# Patient Record
Sex: Male | Born: 1940 | Race: White | Hispanic: No | Marital: Married | Smoking: Former smoker
Health system: Southern US, Community
[De-identification: ages and names within clinical notes are randomized; demographics above are authoritative.]

## PROBLEM LIST (undated history)

## (undated) DIAGNOSIS — S8700XA Crushing injury of unspecified knee, initial encounter: Secondary | ICD-10-CM

## (undated) DIAGNOSIS — Z973 Presence of spectacles and contact lenses: Secondary | ICD-10-CM

## (undated) DIAGNOSIS — K219 Gastro-esophageal reflux disease without esophagitis: Secondary | ICD-10-CM

## (undated) DIAGNOSIS — E785 Hyperlipidemia, unspecified: Secondary | ICD-10-CM

## (undated) DIAGNOSIS — R351 Nocturia: Secondary | ICD-10-CM

## (undated) DIAGNOSIS — K5900 Constipation, unspecified: Secondary | ICD-10-CM

## (undated) DIAGNOSIS — K2981 Duodenitis with bleeding: Secondary | ICD-10-CM

## (undated) DIAGNOSIS — G8929 Other chronic pain: Secondary | ICD-10-CM

## (undated) DIAGNOSIS — E119 Type 2 diabetes mellitus without complications: Secondary | ICD-10-CM

## (undated) DIAGNOSIS — M545 Low back pain, unspecified: Secondary | ICD-10-CM

## (undated) DIAGNOSIS — N400 Enlarged prostate without lower urinary tract symptoms: Secondary | ICD-10-CM

## (undated) DIAGNOSIS — G4700 Insomnia, unspecified: Secondary | ICD-10-CM

## (undated) DIAGNOSIS — R1310 Dysphagia, unspecified: Secondary | ICD-10-CM

## (undated) DIAGNOSIS — M1990 Unspecified osteoarthritis, unspecified site: Secondary | ICD-10-CM

## (undated) DIAGNOSIS — Z9989 Dependence on other enabling machines and devices: Secondary | ICD-10-CM

## (undated) DIAGNOSIS — S82092A Other fracture of left patella, initial encounter for closed fracture: Secondary | ICD-10-CM

## (undated) DIAGNOSIS — J189 Pneumonia, unspecified organism: Secondary | ICD-10-CM

## (undated) DIAGNOSIS — G4733 Obstructive sleep apnea (adult) (pediatric): Secondary | ICD-10-CM

## (undated) DIAGNOSIS — R2681 Unsteadiness on feet: Secondary | ICD-10-CM

## (undated) DIAGNOSIS — M2540 Effusion, unspecified joint: Secondary | ICD-10-CM

## (undated) DIAGNOSIS — K22719 Barrett's esophagus with dysplasia, unspecified: Secondary | ICD-10-CM

## (undated) DIAGNOSIS — I499 Cardiac arrhythmia, unspecified: Secondary | ICD-10-CM

## (undated) DIAGNOSIS — R0600 Dyspnea, unspecified: Secondary | ICD-10-CM

## (undated) DIAGNOSIS — M2550 Pain in unspecified joint: Secondary | ICD-10-CM

## (undated) DIAGNOSIS — S82202A Unspecified fracture of shaft of left tibia, initial encounter for closed fracture: Secondary | ICD-10-CM

## (undated) DIAGNOSIS — K269 Duodenal ulcer, unspecified as acute or chronic, without hemorrhage or perforation: Secondary | ICD-10-CM

## (undated) DIAGNOSIS — J449 Chronic obstructive pulmonary disease, unspecified: Secondary | ICD-10-CM

## (undated) DIAGNOSIS — H9190 Unspecified hearing loss, unspecified ear: Secondary | ICD-10-CM

## (undated) DIAGNOSIS — R350 Frequency of micturition: Secondary | ICD-10-CM

## (undated) HISTORY — PX: FINGER SURGERY: SHX640

## (undated) HISTORY — PX: SHOULDER OPEN ROTATOR CUFF REPAIR: SHX2407

## (undated) HISTORY — PX: ESOPHAGOGASTRODUODENOSCOPY (EGD) WITH ESOPHAGEAL DILATION: SHX5812

## (undated) HISTORY — PX: CATARACT EXTRACTION, BILATERAL: SHX1313

## (undated) HISTORY — PX: REFRACTIVE SURGERY: SHX103

## (undated) HISTORY — PX: JOINT REPLACEMENT: SHX530

## (undated) HISTORY — PX: BACK SURGERY: SHX140

## (undated) HISTORY — PX: TENNIS ELBOW RELEASE/NIRSCHEL PROCEDURE: SHX6651

## (undated) HISTORY — PX: EXCISIONAL HEMORRHOIDECTOMY: SHX1541

## (undated) HISTORY — PX: ESOPHAGOGASTRODUODENOSCOPY: SHX1529

---

## 1998-03-25 ENCOUNTER — Ambulatory Visit (HOSPITAL_COMMUNITY): Admission: RE | Admit: 1998-03-25 | Discharge: 1998-03-25 | Payer: Self-pay | Admitting: Family Medicine

## 1998-03-25 ENCOUNTER — Encounter: Payer: Self-pay | Admitting: Family Medicine

## 1998-05-08 ENCOUNTER — Encounter: Payer: Self-pay | Admitting: Family Medicine

## 1998-05-08 ENCOUNTER — Ambulatory Visit (HOSPITAL_COMMUNITY): Admission: RE | Admit: 1998-05-08 | Discharge: 1998-05-08 | Payer: Self-pay | Admitting: Family Medicine

## 1999-02-09 DIAGNOSIS — J189 Pneumonia, unspecified organism: Secondary | ICD-10-CM

## 1999-02-09 HISTORY — DX: Pneumonia, unspecified organism: J18.9

## 1999-04-27 ENCOUNTER — Emergency Department (HOSPITAL_COMMUNITY): Admission: EM | Admit: 1999-04-27 | Discharge: 1999-04-27 | Payer: Self-pay | Admitting: Emergency Medicine

## 1999-06-17 ENCOUNTER — Ambulatory Visit (HOSPITAL_COMMUNITY): Admission: RE | Admit: 1999-06-17 | Discharge: 1999-06-17 | Payer: Self-pay | Admitting: Gastroenterology

## 1999-06-17 ENCOUNTER — Encounter (INDEPENDENT_AMBULATORY_CARE_PROVIDER_SITE_OTHER): Payer: Self-pay | Admitting: Specialist

## 1999-06-17 ENCOUNTER — Encounter: Payer: Self-pay | Admitting: Gastroenterology

## 1999-10-02 ENCOUNTER — Encounter: Payer: Self-pay | Admitting: Family Medicine

## 1999-10-02 ENCOUNTER — Ambulatory Visit (HOSPITAL_COMMUNITY): Admission: RE | Admit: 1999-10-02 | Discharge: 1999-10-02 | Payer: Self-pay | Admitting: Family Medicine

## 2000-01-29 ENCOUNTER — Encounter: Payer: Self-pay | Admitting: Family Medicine

## 2000-01-29 ENCOUNTER — Ambulatory Visit (HOSPITAL_COMMUNITY): Admission: RE | Admit: 2000-01-29 | Discharge: 2000-01-29 | Payer: Self-pay | Admitting: Family Medicine

## 2000-02-09 HISTORY — PX: LAPAROSCOPIC CHOLECYSTECTOMY: SUR755

## 2000-02-19 ENCOUNTER — Encounter (INDEPENDENT_AMBULATORY_CARE_PROVIDER_SITE_OTHER): Payer: Self-pay | Admitting: Specialist

## 2000-02-19 ENCOUNTER — Observation Stay (HOSPITAL_COMMUNITY): Admission: RE | Admit: 2000-02-19 | Discharge: 2000-02-20 | Payer: Self-pay | Admitting: General Surgery

## 2000-12-22 ENCOUNTER — Ambulatory Visit (HOSPITAL_BASED_OUTPATIENT_CLINIC_OR_DEPARTMENT_OTHER): Admission: RE | Admit: 2000-12-22 | Discharge: 2000-12-22 | Payer: Self-pay | Admitting: Family Medicine

## 2001-02-16 ENCOUNTER — Ambulatory Visit (HOSPITAL_BASED_OUTPATIENT_CLINIC_OR_DEPARTMENT_OTHER): Admission: RE | Admit: 2001-02-16 | Discharge: 2001-02-16 | Payer: Self-pay | Admitting: Family Medicine

## 2004-10-07 ENCOUNTER — Emergency Department (HOSPITAL_COMMUNITY): Admission: EM | Admit: 2004-10-07 | Discharge: 2004-10-07 | Payer: Self-pay | Admitting: Emergency Medicine

## 2007-02-23 ENCOUNTER — Encounter: Admission: RE | Admit: 2007-02-23 | Discharge: 2007-02-23 | Payer: Self-pay | Admitting: Internal Medicine

## 2007-04-09 HISTORY — PX: COLONOSCOPY: SHX174

## 2007-04-19 ENCOUNTER — Encounter (INDEPENDENT_AMBULATORY_CARE_PROVIDER_SITE_OTHER): Payer: Self-pay | Admitting: *Deleted

## 2007-04-19 ENCOUNTER — Ambulatory Visit (HOSPITAL_COMMUNITY): Admission: RE | Admit: 2007-04-19 | Discharge: 2007-04-19 | Payer: Self-pay | Admitting: *Deleted

## 2008-07-17 ENCOUNTER — Encounter (INDEPENDENT_AMBULATORY_CARE_PROVIDER_SITE_OTHER): Payer: Self-pay | Admitting: *Deleted

## 2008-07-17 ENCOUNTER — Ambulatory Visit (HOSPITAL_COMMUNITY): Admission: RE | Admit: 2008-07-17 | Discharge: 2008-07-17 | Payer: Self-pay | Admitting: *Deleted

## 2009-03-11 HISTORY — PX: TOTAL KNEE ARTHROPLASTY: SHX125

## 2009-04-02 ENCOUNTER — Inpatient Hospital Stay (HOSPITAL_COMMUNITY): Admission: RE | Admit: 2009-04-02 | Discharge: 2009-04-05 | Payer: Self-pay | Admitting: Orthopedic Surgery

## 2009-04-28 ENCOUNTER — Encounter
Admission: RE | Admit: 2009-04-28 | Discharge: 2009-07-27 | Payer: Self-pay | Source: Home / Self Care | Admitting: Orthopedic Surgery

## 2009-06-08 HISTORY — PX: KNEE JOINT MANIPULATION: SHX386

## 2009-06-16 ENCOUNTER — Ambulatory Visit (HOSPITAL_COMMUNITY): Admission: RE | Admit: 2009-06-16 | Discharge: 2009-06-16 | Payer: Self-pay | Admitting: Orthopedic Surgery

## 2009-07-28 ENCOUNTER — Encounter: Admission: RE | Admit: 2009-07-28 | Discharge: 2009-08-27 | Payer: Self-pay | Admitting: Orthopedic Surgery

## 2010-04-28 LAB — COMPREHENSIVE METABOLIC PANEL
ALT: 24 U/L (ref 0–53)
AST: 23 U/L (ref 0–37)
Albumin: 4.2 g/dL (ref 3.5–5.2)
Alkaline Phosphatase: 42 U/L (ref 39–117)
BUN: 12 mg/dL (ref 6–23)
CO2: 25 mEq/L (ref 19–32)
Calcium: 9.4 mg/dL (ref 8.4–10.5)
Chloride: 107 mEq/L (ref 96–112)
Creatinine, Ser: 0.8 mg/dL (ref 0.4–1.5)
GFR calc Af Amer: >60 mL/min (ref >60)
GFR calc non Af Amer: >60 mL/min (ref >60)
Glucose, Bld: 112 mg/dL — ABNORMAL HIGH (ref 70–99)
Potassium: 3.7 mEq/L (ref 3.5–5.1)
Sodium: 141 mEq/L (ref 135–145)
Total Bilirubin: 1.7 mg/dL — ABNORMAL HIGH (ref 0.3–1.2)
Total Protein: 6.8 g/dL (ref 6.0–8.3)

## 2010-04-28 LAB — GLUCOSE, CAPILLARY
Glucose-Capillary: 113 mg/dL — ABNORMAL HIGH (ref 70–99)
Glucose-Capillary: 120 mg/dL — ABNORMAL HIGH (ref 70–99)

## 2010-04-29 LAB — COMPREHENSIVE METABOLIC PANEL
ALT: 29 U/L (ref 0–53)
AST: 20 U/L (ref 0–37)
Albumin: 3.7 g/dL (ref 3.5–5.2)
Alkaline Phosphatase: 49 U/L (ref 39–117)
BUN: 16 mg/dL (ref 6–23)
CO2: 31 mEq/L (ref 19–32)
Calcium: 9.3 mg/dL (ref 8.4–10.5)
Chloride: 104 mEq/L (ref 96–112)
Creatinine, Ser: 0.9 mg/dL (ref 0.4–1.5)
GFR calc Af Amer: >60 mL/min (ref >60)
GFR calc non Af Amer: >60 mL/min (ref >60)
Glucose, Bld: 127 mg/dL — ABNORMAL HIGH (ref 70–99)
Potassium: 3.7 mEq/L (ref 3.5–5.1)
Sodium: 143 mEq/L (ref 135–145)
Total Bilirubin: 1.5 mg/dL — ABNORMAL HIGH (ref 0.3–1.2)
Total Protein: 6.8 g/dL (ref 6.0–8.3)

## 2010-04-29 LAB — CBC
HCT: 30.9 % — ABNORMAL LOW (ref 39.0–52.0)
HCT: 35 % — ABNORMAL LOW (ref 39.0–52.0)
HCT: 36.5 % — ABNORMAL LOW (ref 39.0–52.0)
HCT: 44.2 % (ref 39.0–52.0)
Hemoglobin: 11 g/dL — ABNORMAL LOW (ref 13.0–17.0)
Hemoglobin: 12.3 g/dL — ABNORMAL LOW (ref 13.0–17.0)
Hemoglobin: 12.7 g/dL — ABNORMAL LOW (ref 13.0–17.0)
Hemoglobin: 15.3 g/dL (ref 13.0–17.0)
MCHC: 34.7 g/dL (ref 30.0–36.0)
MCHC: 34.8 g/dL (ref 30.0–36.0)
MCHC: 35.1 g/dL (ref 30.0–36.0)
MCHC: 35.6 g/dL (ref 30.0–36.0)
MCV: 100.7 fL — ABNORMAL HIGH (ref 78.0–100.0)
MCV: 98.2 fL (ref 78.0–100.0)
MCV: 98.2 fL (ref 78.0–100.0)
MCV: 99.2 fL (ref 78.0–100.0)
Platelets: 172 10*3/uL (ref 150–400)
Platelets: 174 10*3/uL (ref 150–400)
Platelets: 181 10*3/uL (ref 150–400)
Platelets: 210 10*3/uL (ref 150–400)
RBC: 3.15 MIL/uL — ABNORMAL LOW (ref 4.22–5.81)
RBC: 3.52 MIL/uL — ABNORMAL LOW (ref 4.22–5.81)
RBC: 3.71 MIL/uL — ABNORMAL LOW (ref 4.22–5.81)
RBC: 4.39 MIL/uL (ref 4.22–5.81)
RDW: 14.1 % (ref 11.5–15.5)
RDW: 14.1 % (ref 11.5–15.5)
RDW: 14.1 % (ref 11.5–15.5)
RDW: 14.9 % (ref 11.5–15.5)
WBC: 10.2 10*3/uL (ref 4.0–10.5)
WBC: 6.3 10*3/uL (ref 4.0–10.5)
WBC: 6.6 10*3/uL (ref 4.0–10.5)
WBC: 7.7 10*3/uL (ref 4.0–10.5)

## 2010-04-29 LAB — URINALYSIS, ROUTINE W REFLEX MICROSCOPIC
Bilirubin Urine: NEGATIVE
Glucose, UA: NEGATIVE mg/dL
Hgb urine dipstick: NEGATIVE
Ketones, ur: NEGATIVE mg/dL
Nitrite: NEGATIVE
Protein, ur: NEGATIVE mg/dL
Specific Gravity, Urine: 1.022 (ref 1.005–1.030)
Urobilinogen, UA: 1 mg/dL (ref 0.0–1.0)
pH: 5.5 (ref 5.0–8.0)

## 2010-04-29 LAB — BASIC METABOLIC PANEL
BUN: 11 mg/dL (ref 6–23)
BUN: 13 mg/dL (ref 6–23)
CO2: 28 mEq/L (ref 19–32)
CO2: 28 mEq/L (ref 19–32)
Calcium: 8.6 mg/dL (ref 8.4–10.5)
Calcium: 8.7 mg/dL (ref 8.4–10.5)
Chloride: 100 mEq/L (ref 96–112)
Chloride: 100 mEq/L (ref 96–112)
Creatinine, Ser: 1.08 mg/dL (ref 0.4–1.5)
Creatinine, Ser: 1.2 mg/dL (ref 0.4–1.5)
GFR calc Af Amer: >60 mL/min (ref >60)
GFR calc Af Amer: >60 mL/min (ref >60)
GFR calc non Af Amer: >60 mL/min (ref >60)
GFR calc non Af Amer: >60 mL/min (ref >60)
Glucose, Bld: 146 mg/dL — ABNORMAL HIGH (ref 70–99)
Glucose, Bld: 149 mg/dL — ABNORMAL HIGH (ref 70–99)
Potassium: 3.8 mEq/L (ref 3.5–5.1)
Potassium: 4 mEq/L (ref 3.5–5.1)
Sodium: 135 mEq/L (ref 135–145)
Sodium: 136 mEq/L (ref 135–145)

## 2010-04-29 LAB — GLUCOSE, CAPILLARY
Glucose-Capillary: 112 mg/dL — ABNORMAL HIGH (ref 70–99)
Glucose-Capillary: 124 mg/dL — ABNORMAL HIGH (ref 70–99)
Glucose-Capillary: 125 mg/dL — ABNORMAL HIGH (ref 70–99)
Glucose-Capillary: 133 mg/dL — ABNORMAL HIGH (ref 70–99)
Glucose-Capillary: 134 mg/dL — ABNORMAL HIGH (ref 70–99)
Glucose-Capillary: 139 mg/dL — ABNORMAL HIGH (ref 70–99)
Glucose-Capillary: 146 mg/dL — ABNORMAL HIGH (ref 70–99)
Glucose-Capillary: 155 mg/dL — ABNORMAL HIGH (ref 70–99)
Glucose-Capillary: 155 mg/dL — ABNORMAL HIGH (ref 70–99)
Glucose-Capillary: 156 mg/dL — ABNORMAL HIGH (ref 70–99)
Glucose-Capillary: 158 mg/dL — ABNORMAL HIGH (ref 70–99)
Glucose-Capillary: 194 mg/dL — ABNORMAL HIGH (ref 70–99)
Glucose-Capillary: 196 mg/dL — ABNORMAL HIGH (ref 70–99)

## 2010-04-29 LAB — TYPE AND SCREEN
ABO/RH(D): A POS
Antibody Screen: POSITIVE
DAT, IgG: NEGATIVE

## 2010-04-29 LAB — PROTIME-INR
INR: 0.89 (ref 0.00–1.49)
INR: 1.08 (ref 0.00–1.49)
INR: 1.4 (ref 0.00–1.49)
INR: 1.51 — ABNORMAL HIGH (ref 0.00–1.49)
Prothrombin Time: 12 seconds (ref 11.6–15.2)
Prothrombin Time: 13.9 seconds (ref 11.6–15.2)
Prothrombin Time: 17 seconds — ABNORMAL HIGH (ref 11.6–15.2)
Prothrombin Time: 18.1 seconds — ABNORMAL HIGH (ref 11.6–15.2)

## 2010-04-29 LAB — APTT: aPTT: 33 seconds (ref 24–37)

## 2010-04-29 LAB — ABO/RH: ABO/RH(D): A POS

## 2010-06-23 NOTE — Op Note (Signed)
[name], [name]                ACCOUNT NO.:  [id number]   MEDICAL RECORD NO.:  [id number]          PATIENT TYPE:  AMB   LOCATION:  ENDO                         FACILITY:  [facility]   PHYSICIAN:  Georgiana Spinner, M.D.    DATE OF BIRTH:  [birth year]   DATE OF PROCEDURE:  04/19/2007  DATE OF DISCHARGE:                               OPERATIVE REPORT   PROCEDURE:  Colonoscopy.   INDICATIONS:  Colon polyps, colon cancer screening.   ANESTHESIA:  Fentanyl 50 mcg, Versed 6 mg.   PROCEDURE:  With the patient mildly sedated in the left lateral  decubitus position, a rectal exam was performed which was unremarkable  to my exam.  Subsequently the Pentax videoscopic colonoscope was  inserted in the rectum and passed under direct vision through a very  tortuous, extremely diverticular filled sigmoid colon.  We subsequently  reach the cecum identified by ileocecal valve and appendiceal orifice  both of which were photographed.  Prep was somewhat suboptimal in that  there were areas of liquid brownish stool with particulate matter in  places that we suctioned as best we could but from this point the  colonoscope was slowly withdrawn taking circumferential views of colonic  mucosa stopping only the rectum which appeared normal on direct and  showed hemorrhoids on retroflexed view.  The endoscope was straightened  and withdrawn.  The patient's vital signs, pulse oximeter remained  stable.  The patient tolerated procedure well without apparent  complication.   FINDINGS:  Significant diverticulosis of sigmoid colon.  Internal  hemorrhoids, otherwise unremarkable exam.   PLAN:  Consider repeat examination in 5-10 years.           ______________________________  Georgiana Spinner, M.D.     GMO/MEDQ  D:  04/19/2007  T:  04/20/2007  Job:  [id number]

## 2010-06-23 NOTE — Op Note (Signed)
NAMECAPRICE, WASKO NO.:  [id number]   MEDICAL RECORD NO.:  [id number]          PATIENT TYPE:  AMB   LOCATION:  ENDO                         FACILITY:  [facility]   PHYSICIAN:  Georgiana Spinner, M.D.    DATE OF BIRTH:  [birth year]   DATE OF PROCEDURE:  07/17/2008  DATE OF DISCHARGE:                               OPERATIVE REPORT   PROCEDURE:  Upper endoscopy and biopsy.   INDICATIONS:  Gastroesophageal reflux disease with Barrett's esophagus.   ANESTHESIA:  Fentanyl 50 mcg, Versed 5 mg.   PROCEDURE:  With the patient mildly sedated in the left lateral  decubitus position, the Pentax videoscopic endoscope was inserted into  the mouth, passed under direct vision through the esophagus, which  appeared normal until we reached the distal esophagus, where there were  changes of Barrett's, photographed and biopsied.  We entered into  stomach.  Fundus, body, antrum, duodenal bulb, second portion duodenum  all appeared normal.  From this point, the endoscope was slowly  withdrawn, taking circumferential views of duodenal mucosa until the  endoscope had been pulled back into the stomach, placed in retroflexion  to view the stomach from below.  The endoscope was straightened and  withdrawn, taking circumferential views of remaining gastric and  esophageal mucosa.  The patient's vital signs, pulse oximeter remained  stable.  The patient tolerated procedure well, without apparent  complication.   FINDINGS:  Barrett's esophagus, biopsied.  Await biopsy report.  The  patient will call me for results and follow up with me as an outpatient.           ______________________________  Georgiana Spinner, M.D.     GMO/MEDQ  D:  07/17/2008  T:  07/17/2008  Job:  [id number]

## 2010-06-23 NOTE — Op Note (Signed)
[name], [name]                ACCOUNT NO.:  [id number]   MEDICAL RECORD NO.:  [id number]          PATIENT TYPE:  AMB   LOCATION:  ENDO                         FACILITY:  [facility]   PHYSICIAN:  Georgiana Spinner, M.D.    DATE OF BIRTH:  1941-01-05   DATE OF PROCEDURE:  04/19/2007  DATE OF DISCHARGE:                               OPERATIVE REPORT   PROCEDURE:  Upper endoscopy.   INDICATIONS:  Gastroesophageal reflux disease.   ANESTHESIA:  Fentanyl 50 mcg, Versed 4 mg.   PROCEDURE:  With the patient mildly sedated in the left lateral  decubitus position, the Pentax videoscopic endoscope was inserted in the  mouth, passed under direct vision through the esophagus which appeared  normal until we reached distal esophagus and there were changes of  Barrett's esophagus photographed and biopsied.  We entered into the  stomach fundus, body appeared normal.  Antrum showed some mild erythema  that was also photographed and biopsied.  Duodenal bulb, and second  portion duodenum appeared normal.  From this point the endoscope was  slowly withdrawn taking circumferential views of duodenal mucosa until  the endoscope had been pulled back into the stomach, placed in  retroflexion to view the stomach from below.  The endoscope was  straightened and withdrawn taking circumferential views of the remaining  gastric and esophageal mucosa.  The patient's vital signs, pulse  oximeter remained stable.  The patient tolerated procedure well without  apparent complications.   FINDINGS:  Barrett's esophagus biopsied and erythema of the antrum  biopsied.  Await biopsy reports.  The patient will call me for results  and follow-up with me as an outpatient.  Proceed to colonoscopy.           ______________________________  Georgiana Spinner, M.D.     GMO/MEDQ  D:  04/19/2007  T:  04/20/2007  Job:  [id number]

## 2010-06-26 NOTE — Op Note (Signed)
[facility]  Patient:    [name], [name]                       MRN: [id number] Proc. Date: 02/19/00 Adm. Date:  [id number] Disc. Date: [id number] Attending:  Chevis Pretty S                           Operative Report  PREOPERATIVE DIAGNOSIS:  Symptomatic cholelithiasis.  POSTOPERATIVE DIAGNOSIS:  Symptomatic cholelithiasis.  PROCEDURE:  Laparoscopic cholecystectomy.  SURGEON:  Dr. Carolynne Edouard.  ASSISTANT:  Dr. Zachery Dakins.  ANESTHESIA:  General endotracheal.  DESCRIPTION OF PROCEDURE:  After informed consent was obtained, the patient was brought to the operating room and placed in the supine position on the operating table. After adequate induction of general anesthesia, the patients abdomen was prepped with Betadine and draped in the usual sterile manner. A small transverse supraumbilical incision was made with a 15 bladder knife. This incision was carried down through the rest of the subcutaneous tissue using blunt dissection with the Kelly clamp and the retractors until the linea alba was encountered. The linea alba was incised with a 15 blade knife and each side was grasped with Kocher clamps and elevated. The hole was then probed bluntly with the Kelly clamp until the peritoneum was opened bluntly and access was gained to the abdominal cavity. A #0 Vicryl pursestring suture stitch was then placed in the fascia around this hole and a Hasson cannula was placed through this hole into the abdominal cavity and anchored with the previously placed Vicryl pursestring stitch. The abdomen was then insufflated with carbon dioxide and a laparoscope was placed through the Hasson cannula. There were several adhesions through the abdominal wall that had prevented easy visualization of the rest of the abdominal cavity but a clear space was found laterally that the camera was able to go through in order to see the right upper quadrant of the abdomen. Next, a needle was used  to identify the area just to the right of the falciform ligament in the abdominal cavity and this area was infiltrated with 0.25% Marcaine. A small transverse upper midline incision was then made with the 15 blade knife and a 10 mm port was placed through this incision bluntly into the abdominal cavity under direct vision. Next, two areas laterally on the right side of the abdomen below the costal margin were also identified with a needle and these areas infiltrated with 0.25% Marcaine. Small incisions were made over top of these areas with a 15 blade knife and two 5 mm ports were placed through these incisions into the abdominal cavity bluntly under direct vision. A blunt grasper was then placed through the lateral and 5 mm port and used to grasp the dome of the gallbladder. Another blunt grasper was placed through the other lateral 5 mm port and used to retract on the omentum that was densely adherent to the edge of the liver. Using laparoscopic scissors with the electrocautery, these adhesions of the omentum to the liver edge were taken down sharply until the visualization of the gallbladder was easy and clear. The dome of the gallbladder was then grasped with the lateral most blunt grasper and elevated anteriorly and superiorly. Another blunt grasper was placed through the other lateral 5 mm port and used to grasp the neck of the gallbladder for retraction. A [place] resector was then placed through the upper midline port and used to  take down some adhesions using the Bovie electrocautery. Adhesions that were between the liver and the neck of the gallbladder. This allowed better visualization of the area around the gallbladder neck and cystic duct. The peritoneal reflection overlying this area was then opened with the Bovie electrocautery and hooked to the [employer]. Once this was done, the area at the neck of the gallbladder and the cystic duct was dissected bluntly using  both the [facility] as well as a right angled dissector until this area had been cleared circumferentially and the gallbladder neck cystic duct junction was easily identified. Care was taken to keep the common duct medial to this dissection. Three clips were then placed proximally and one distally on the cystic duct and the cystic duct was ligated between the two with laparoscopic scissors. The cystic artery was then readily identified posterior to this structure and again dissected in a circumferential manner bluntly using the [place]. Two clips were then placed proximally and one distally on the cystic artery and the cystic artery was divided between the two with the laparoscopic scissors. Next using a hook cautery, the gallbladder was sharply separated from the liver bed. Prior to completely detaching the gallbladder, the liver bed was inspected and several small bleeding points were coagulated with the Bovie electrocautery. The liver bed was hemostatic and the rest of the gallbladder was removed from the liver bed using the hook cautery. The laparoscope was then moved to the upper midline port and a gallbladder grasper was placed through the Hasson cannula. Under direct vision. the neck of the gallbladder was grasped with the gallbladder grasper and removed with the Hasson cannula through the umbilical port. The fascial defect was then closed with the previously placed Vicryl pursestring stitch. The irrigation and suction device was placed through one of the lateral most 5 mm ports and used to irrigate the abdomen with copious amounts of saline until the effluent was clear. Each of the ports were then removed under direct vision and found to be hemostatic. The skin incisions were closed with interrupted 4-0 monocryl subcuticular stitches. Benzoin and Steri-Strips were applied. Each of the wounds was infiltrated with 0.25% Marcaine for postoperative anesthesia. The  patient tolerated the procedure well. At the end of the case, all needle, sponge and instrument counts were correct. The patient was then awakened and taken to the recovery room in stable condition. DD:  02/19/00 TD:  02/20/00 Job: [id number]  [id number]

## 2010-06-26 NOTE — Procedures (Signed)
[facility]  Patient:    [name], [name]                       MRN: [id number] Proc. Date: 06/17/99 Adm. Date:  [id number] Attending:  Mardella Layman CC:         Vania Rea. Jarold Motto, M.D. [facility]             Robert A. Eliezer Lofts., M.D.                           Procedure Report  PROCEDURE:  Esophageal dilatation and endoscopy.  ENDOSCOPIST:  Vania Rea. Jarold Motto, M.D.  INDICATION:  [name] is a 70 year old white male who has recurrent solid food dysphagia.  He had a meat impaction in his esophagus and required emergency endoscopy and disimpaction at [facility] on April 27, 1999.  He is scheduled for followup endoscopy and dilatation at this time.  The risks and benefits of this procedure including bleeding and perforation were explained in detail; he agreed to proceed as planned.  Preoperative cardiopulmonary and mental status exams were unremarkable.  ENDOSCOPY REPORT:  Throughout this procedure, the patient was on pulse oximetry and cardiac ______.  He tolerated this procedure well, receiving supplemental low-flow oxygen by nasal cannula throughout the procedure.  He was anesthetized with Cetacaine spray to the oropharynx, Fentanyl 50 mcg IV and Versed 5 mg IV.  He was intubated using the Olympus adult videoendoscope. The esophagus was generally unremarkable except for a 3- to 4-cm segment of salmon-colored mucosa in the distal esophagus consistent with Barretts mucosa.  Pictures were obtained as were mucosal biopsies.  There was smooth narrowing of the gastroesophageal junction, but the endoscope passed without difficulty into the stomach.  There were no retained food products or blood in the stomach.  The mucosal pattern in the fundus, body, antrum and pylorus of stomach was normal, as were the duodenal bulb and duodenal sweep.  Retroflexed view of the fundus and cardia of the stomach was unremarkable.  The endoscope was then placed in antrum of  the stomach.  With fluoroscopy as a control measure, a floppy-tipped guidewire was inserted through the endoscope into the antrum.  Again with fluoroscopy, the endoscope was then withdrawn over the guidewire.  Patient was then dilated with a #18-mm Savary dilator over the guidewire.  This was seen fluoroscopically to pass into the stomach. The patient tolerated this procedure well and was extubated without difficulty.  He was returned in stable condition to the recovery room for observation.  ASSESSMENT: 1. 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 2. 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  RECOMMENDATIONS: 1. Soft diet for 24 hours, to be advanced as tolerated. 2. Chronic antireflux regime along with Aciphex 20 mg a day. 3. Office followup in one months time. DD:  06/17/99 TD:  06/18/99 Job: [id number]

## 2010-10-10 HISTORY — PX: ANTERIOR CERVICAL DECOMP/DISCECTOMY FUSION: SHX1161

## 2010-10-27 ENCOUNTER — Encounter (HOSPITAL_COMMUNITY)
Admission: RE | Admit: 2010-10-27 | Discharge: 2010-10-27 | Disposition: A | Discharge status: Home / Self Care | Payer: Medicare Other | Source: Ambulatory Visit | Attending: Orthopedic Surgery | Admitting: Orthopedic Surgery

## 2010-10-27 ENCOUNTER — Other Ambulatory Visit (HOSPITAL_COMMUNITY): Payer: Self-pay | Admitting: Orthopedic Surgery

## 2010-10-27 DIAGNOSIS — M5412 Radiculopathy, cervical region: Secondary | ICD-10-CM

## 2010-10-27 LAB — DIFFERENTIAL
Basophils Absolute: 0 10*3/uL (ref 0.0–0.1)
Basophils Relative: 1 % (ref 0–1)
Eosinophils Absolute: 0.3 10*3/uL (ref 0.0–0.7)
Eosinophils Relative: 5 % (ref 0–5)
Lymphocytes Relative: 26 % (ref 12–46)
Lymphs Abs: 1.7 10*3/uL (ref 0.7–4.0)
Monocytes Absolute: 0.7 10*3/uL (ref 0.1–1.0)
Monocytes Relative: 11 % (ref 3–12)
Neutro Abs: 3.9 10*3/uL (ref 1.7–7.7)
Neutrophils Relative %: 58 % (ref 43–77)

## 2010-10-27 LAB — BASIC METABOLIC PANEL
BUN: 18 mg/dL (ref 6–23)
CO2: 28 mEq/L (ref 19–32)
Calcium: 9.6 mg/dL (ref 8.4–10.5)
Chloride: 105 mEq/L (ref 96–112)
Creatinine, Ser: 0.84 mg/dL (ref 0.50–1.35)
GFR calc Af Amer: >60 mL/min (ref >60)
GFR calc non Af Amer: >60 mL/min (ref >60)
Glucose, Bld: 131 mg/dL — ABNORMAL HIGH (ref 70–99)
Potassium: 4.3 mEq/L (ref 3.5–5.1)
Sodium: 140 mEq/L (ref 135–145)

## 2010-10-27 LAB — CBC
HCT: 40.1 % (ref 39.0–52.0)
Hemoglobin: 14.2 g/dL (ref 13.0–17.0)
MCH: 34.5 pg — ABNORMAL HIGH (ref 26.0–34.0)
MCHC: 35.4 g/dL (ref 30.0–36.0)
MCV: 97.3 fL (ref 78.0–100.0)
Platelets: 230 10*3/uL (ref 150–400)
RBC: 4.12 MIL/uL — ABNORMAL LOW (ref 4.22–5.81)
RDW: 13.1 % (ref 11.5–15.5)
WBC: 6.6 10*3/uL (ref 4.0–10.5)

## 2010-10-27 LAB — SURGICAL PCR SCREEN
MRSA, PCR: NEGATIVE
Staphylococcus aureus: NEGATIVE

## 2010-10-29 ENCOUNTER — Inpatient Hospital Stay (HOSPITAL_COMMUNITY)
Admission: RE | Admit: 2010-10-29 | Discharge: 2010-11-02 | DRG: 471 | Disposition: A | Discharge status: Home / Self Care | Payer: Medicare Other | Source: Ambulatory Visit | Attending: Orthopedic Surgery | Admitting: Orthopedic Surgery

## 2010-10-29 ENCOUNTER — Inpatient Hospital Stay (HOSPITAL_COMMUNITY): Payer: Medicare Other

## 2010-10-29 DIAGNOSIS — E876 Hypokalemia: Secondary | ICD-10-CM | POA: Diagnosis not present

## 2010-10-29 DIAGNOSIS — E785 Hyperlipidemia, unspecified: Secondary | ICD-10-CM | POA: Diagnosis present

## 2010-10-29 DIAGNOSIS — M47812 Spondylosis without myelopathy or radiculopathy, cervical region: Principal | ICD-10-CM | POA: Diagnosis present

## 2010-10-29 DIAGNOSIS — E119 Type 2 diabetes mellitus without complications: Secondary | ICD-10-CM | POA: Diagnosis present

## 2010-10-29 DIAGNOSIS — Z01812 Encounter for preprocedural laboratory examination: Secondary | ICD-10-CM

## 2010-10-29 DIAGNOSIS — J189 Pneumonia, unspecified organism: Secondary | ICD-10-CM | POA: Diagnosis not present

## 2010-10-29 DIAGNOSIS — G4733 Obstructive sleep apnea (adult) (pediatric): Secondary | ICD-10-CM | POA: Diagnosis present

## 2010-10-29 DIAGNOSIS — K219 Gastro-esophageal reflux disease without esophagitis: Secondary | ICD-10-CM | POA: Diagnosis present

## 2010-10-29 LAB — GLUCOSE, CAPILLARY
Glucose-Capillary: 112 mg/dL — ABNORMAL HIGH (ref 70–99)
Glucose-Capillary: 138 mg/dL — ABNORMAL HIGH (ref 70–99)
Glucose-Capillary: 154 mg/dL — ABNORMAL HIGH (ref 70–99)

## 2010-10-30 ENCOUNTER — Inpatient Hospital Stay (HOSPITAL_COMMUNITY): Payer: Medicare Other

## 2010-10-30 ENCOUNTER — Encounter: Payer: Self-pay | Admitting: Internal Medicine

## 2010-10-30 LAB — GLUCOSE, CAPILLARY
Glucose-Capillary: 127 mg/dL — ABNORMAL HIGH (ref 70–99)
Glucose-Capillary: 141 mg/dL — ABNORMAL HIGH (ref 70–99)
Glucose-Capillary: 123 mg/dL — ABNORMAL HIGH (ref 70–99)
Glucose-Capillary: 138 mg/dL — ABNORMAL HIGH (ref 70–99)

## 2010-10-30 LAB — URINALYSIS, ROUTINE W REFLEX MICROSCOPIC
Bilirubin Urine: NEGATIVE
Glucose, UA: NEGATIVE mg/dL
Hgb urine dipstick: NEGATIVE
Ketones, ur: NEGATIVE mg/dL
Leukocytes, UA: NEGATIVE
Nitrite: NEGATIVE
Protein, ur: NEGATIVE mg/dL
Specific Gravity, Urine: 1.01 (ref 1.005–1.030)
Urobilinogen, UA: 1 mg/dL (ref 0.0–1.0)
pH: 7.5 (ref 5.0–8.0)

## 2010-10-30 LAB — BASIC METABOLIC PANEL
BUN: 9 mg/dL (ref 6–23)
CO2: 27 mEq/L (ref 19–32)
Calcium: 9.2 mg/dL (ref 8.4–10.5)
Chloride: 101 mEq/L (ref 96–112)
Creatinine, Ser: 0.77 mg/dL (ref 0.50–1.35)
GFR calc Af Amer: >60 mL/min (ref >60)
GFR calc non Af Amer: >60 mL/min (ref >60)
Glucose, Bld: 144 mg/dL — ABNORMAL HIGH (ref 70–99)
Potassium: 3.5 mEq/L (ref 3.5–5.1)
Sodium: 137 mEq/L (ref 135–145)

## 2010-10-30 LAB — CARDIAC PANEL(CRET KIN+CKTOT+MB+TROPI)
CK, MB: 2.3 ng/mL (ref 0.3–4.0)
Relative Index: 0.4 (ref 0.0–2.5)
Total CK: 621 U/L — ABNORMAL HIGH (ref 7–232)
Troponin I: <0.3 ng/mL (ref <0.30)

## 2010-10-30 LAB — CBC
HCT: 39.1 % (ref 39.0–52.0)
Hemoglobin: 13.9 g/dL (ref 13.0–17.0)
MCH: 34.6 pg — ABNORMAL HIGH (ref 26.0–34.0)
MCHC: 35.5 g/dL (ref 30.0–36.0)
MCV: 97.3 fL (ref 78.0–100.0)
Platelets: 182 10*3/uL (ref 150–400)
RBC: 4.02 MIL/uL — ABNORMAL LOW (ref 4.22–5.81)
RDW: 13.2 % (ref 11.5–15.5)
WBC: 17.9 10*3/uL — ABNORMAL HIGH (ref 4.0–10.5)

## 2010-10-30 LAB — PRO B NATRIURETIC PEPTIDE: Pro B Natriuretic peptide (BNP): 246.5 pg/mL — ABNORMAL HIGH (ref 0–125)

## 2010-10-30 NOTE — H&P (Signed)
Hospital Consultation Note Date: 10/30/2010  Patient name: [name] Medical record number: [id number] Date of birth: [birth year] Age: 70 y.o. Gender: male PCP: No primary provider on file.  Consulting Medical Service: Medicine Teaching Service B2   Resident [id number]):  Dr. Carrolyn Meiers   Pager:  [phone] Resident (R1):  Dr. Dede Query    Pager:  [phone]  Reason for Consultation: Evaluate New Shortness of Breath  History of Present Illness: Mr. Bozman is a 70 year old gentleman with a history of DM, HLD, OSA, GERD, and cervical spondylotic radiculopathy C4-C7 who is in the hospital recovering from anterior approach cervical diskectomy and fusion yesterday 10/29/10.  This afternoon, he experienced significantly increased shortness of breath.  This was accompanied by subjective congestion, as the patient reports he could not breath through his nose and he developed a non-productive cough.  He was febrile, tachycardic, and mildly tachypnic, with vitals T 103, P 120, BP 155/85, R 24.  This was also accompanied by diaphoresis.  Around the same time, he experienced itching and a rash developed across his chest shoulders and back.  He denies any throat tightness at the time or currently, but he has had some throat soreness.  His family reports that he has had itching in the past taking various narcotics, including the percocet he takes chronically at home.  His SOB has since improved but is not back to baseline, and he is experiencing a lot of secretions in his throat.  No CP, pain with deep breath, leg pain, dysuria, or diarrhea.  He has frequent constipation at baseline, and has not had a bowel movement since admission yesterday.  He had an echocardiogram performed 10/21/09 that showed an EF of >55% and no significant abnormalities.  He underwent Dipyridamole Stress Test 09/27/2009 that showed no ECG changes and a normal EF.     Inpatient Meds: Acetaminophen 1000   MG IV Q6H-ALT   20Sep  2230 21Sep   1030 400 ML/HR   Diphenhydramine (benadryl) 25   MG IV Q6H   21Sep  2400         Docusate sodium (colace) 100   MG PO BID   20Sep  2200         Influenza inactive virus 2011-12 vaccine 0.5   ML IM ONCE-10A   21Sep  1000        0001       Insulin aspart (novolog) + 15 UNIT SQ TIDWC   20Sep  1800         Insulin aspart (novolog) + 5 UNIT SQ QHS   20Sep  2200         Lactated ringers 1000   ML IV Q12H-ALT   20Sep  1739   85 ML/HR   Metformin (glucophage) 1000   MG PO QDWC   21Sep  0800         Methylprednisolone na succ (solu-medrol) 120   MG IV NOW   21Sep  2123        0001       Pantoprazole (protonix) 40   MG PO Q1200   21Sep  1200         Pioglitazone (actos) 30   MG PO QDWC   21Sep  0800         Piperacillin/tazobactam (zosyn) / d5w 3.375   GM IV Q8H   21Sep  2400   12.5 ML/HR   Pneumococcal vacc (pnu-immune/pneumovax) 0.5   ML IM ONCE-10A  21Sep  1000        0001       Rosuvastatin (crestor) 20   MG PO TTSS-6P   20Sep  1800         Rosuvastatin (crestor) 40   MG PO MWF-6P   21Sep  1800         Vancomycin + VANCOMYCIN 1250   MG IV NOW   21Sep  2230        0001 175 ML/HR   Vancomycin + VANCOMYCIN 1250   MG IV Q12H-ALT   22Sep  1100   175 ML/HR         Zosyn protocol     1   EA MISC PRO   21Sep  2123         Chloraseptic CHLORASEPTIC   1   SPRAY TOP PRN prn 20Sep  1740         Hydromorphone inj DILAUDID 1   MG IV Q4HPRN prn 21Sep  2121         Mag/alum hydrox/simeth (antacid plus) 30   ML PO Q6HPRN prn 20Sep  1740         Mechanical vte  prophylaxis   1   EA MISC PRN prn 20Sep  1739         Menthol/camphor SARNA   1   APPL TOP PRN prn 21Sep  2122         Menthol/cetylpyrd (cepacol) CEPACOL   1   LOZ PO PRN prn 20Sep  1740                Methocarbamol (robaxin) + ROBAXIN 500   MG IV Q6HPRN prn 20Sep  1740   100 ML/HR   Ondansetron  (zofran) ZOFRAN 4   MG IV Q4HPRN prn 20Sep  1740         Oxycodone/apap (percocet/tylox/roxicet) 2 TAB PO Q4HPRN prn 20Sep  1845         Senna   (senokot) SENNA 1   TAB PO QHSPRN prn 20Sep  1740         Zolpidem (ambien) AMBIEN 5   MG PO QHSPRN prn 20Sep  1740           Home Meds: ACTOPLUS MET (PIOGLITAZONE/METFORMIN) 15/500 MG, po, Dose: 2 tab, daily  ALEVE (NAPROXEN SODIUM) 220 MG, po, Dose: 2 tab, bid  ASPIRIN ENTERIC COATED 81 MG, po, Dose: 1 tab, daily  CITRUCEL OTC, po, Dose: 2 tab, daily  FISH OIL 1200mg , po, Dose: 2 tab, daily LIPITOR (ATORVASTATIN) 80 MG, po, Dose: 0.5 - 1 tab, daily  MULTIVITAMINS, po, Dose: 1 tab, daily  OXYCODONE / ACETAMINOPHEN 7.5/325 MG, po, Dose: 2 tab, q4h, PRN  PRILOSEC (OMEPRAZOLE) 20 MG, po, Dose: 1 tab, daily  SAW PALMETTO 500mg , po, Dose: 1 tab, daily VITAMIN B-12 (CYANOCOBALAMIN) , po, Dose: 1 tab, daily  VITAMIN D3 1000 UNITS, po, Dose: 1 tab, daily   Allergies: NKDA   Past Medical History: DM HLD OSA GERD Hemorrhoids  Past Surgical History: R Total Knee Arthroplasty Bilateral Rotator Cuff Repair Straightening of Nasal Septum Hemorrhoidectomy Cholecystectomy? "Stomach Resection"  Family History: DM- Brother, grandfather Cancer- multiple close relatives  Social History: Married Past smoker, quite 25 years ago. Occasional alcohol use. No drug use.   Review of Systems: See HPI   Physical Exam: T 100.7, P 114, BP 119/85, R 22, SiO2 94% on 3L O2  General: alert, well-developed, in mild distress, and cooperative to examination.  Head: normocephalic  and atraumatic.  Eyes: vision grossly intact, pupils equal, pupils round, pupils reactive to light, no injection and anicteric.  Mouth: due to anatomy and decreased mouth opening from neck brace, could not visual pharynx very well.  No erythema or exudates seem.  No definite edema seen.   Neck: brace in place over surgical dressings.  Neck not examined.  Lungs: Coarse breath sounds heard anteriorly diffusely with some scattered crackles.  Normal respiratory effort, no accessory muscle use.  Frequent coughing and  throat clearing without any production of sputum.  Heart: fast rate, regular rhythm, no murmur, no gallop, and no rub.  Abdomen: soft, non-tender, normal bowel sounds.  Moderate distention at baseline per patient.  No guarding, no rebound tenderness. Msk: no joint swelling, no joint warmth, and no redness over joints.  Pulses: 2+ DP/PT pulses bilaterally Extremities: No cyanosis, clubbing, edema Neurologic: alert & oriented X3, cranial nerves II-X, XII intact.  CN XI not tested.  Moving all four extremities.  No subjective sensation deficits to light touch.  Skin:  Erythematous papular rash spread across anterior chest, upper arms and sides of neck.  Per family this spreads down patient's back, but back exam was deferred since patient post-operative from cervical fusion.    Lab results:  CBC:  Result Name                               Result     Abnl   Normal Range     Units      Perf. Loc.  WBC                                       17.9       h      4.0-10.5         K/uL  RBC                                        4.02       l      4.22-5.81        MIL/uL  Hemoglobin (HGB)                          13.9              13.0-17.0        g/dL  Hematocrit (HCT)                          39.1              39.0-52.0        %  MCV                                        97.3              78.0-100.0       fL  MCH -  34.6       h      26.0-34.0        pg  MCHC                                      35.5              30.0-36.0        g/dL  RDW                                       13.2              11.5-15.5        %  Platelet Count (PLT)                      182               150-400          K/uL  BMET:  Result Name                               Result     Abnl   Normal Range     Units      Perf. Loc.  Sodium (NA)                               137               135-145          mEq/L  Potassium (K)                             3.5               3.5-5.1           mEq/L  Chloride                                   101               96-112           mEq/L  CO2                                        27                19-32            mEq/L  Glucose                                   144        h      70-99            mg/dL  BUN  9                 6-23             mg/dL  Creatinine                                0.77              0.50-1.35        mg/dL  GFR, Est Non African American        >60               >60              mL/min  GFR, Est African American                >60               >60              mL/min    Oversized comment, see footnote  1  Calcium                                   9.2               8.4-10.5         mg/dL  CBG:  Basename 36/64/40 2050 10/30/10 1608 10/30/10 1115 10/30/10 0603 10/29/10 2058 10/29/10 1534  GLUCAP 138* 123* 141* 127* 154* 138*   Imaging results:  Dg Cervical Spine 2-3 Views  10/29/2010  *RADIOLOGY REPORT*  Clinical Data: Postop procedure.  CERVICAL SPINE - 2-3 VIEW  Comparison: Intraoperative radiographs same date.  Findings: There are stable postsurgical changes status post C4-C7 discectomy and fusion.  The inferior extent of the hardware is not visualized in the lateral projection.  No displacement of the prosthetic discs is identified.  There is mild prevertebral soft tissue swelling.  Prominent ossification of the stylohyoid ligament is noted.  IMPRESSION: Stable appearance status post C4-C7 discectomy and fusion.  The inferior extent of the hardware is not visualized in the lateral projection due to overlap with the patient's shoulders.  Original Report Authenticated By: Gerrianne Scale, M.D.   Dg Cervical Spine 2-3 Views  10/29/2010  *RADIOLOGY REPORT*  Clinical Data: Cervical degenerative disc disease.  CERVICAL SPINE - 2-3 VIEW  Comparison: Radiographs dated 10/27/2010  Findings: AP and lateral C-arm images demonstrate the patient has undergone anterior cervical fusion at  C4-5, C5-6, and C6-7. Anterior plate, screws, and interbody spacers appear in good position.  IMPRESSION: Anterior cervical fusion at C4-5, C5-6, and C6-7.  Original Report Authenticated By: Gwynn Burly, M.D.   Dg Chest Portable 1 View  10/30/2010  *RADIOLOGY REPORT*  Clinical Data: Sudden onset of severe shortness of breath.  PORTABLE CHEST - 1 VIEW  Comparison: 03/27/2009  Findings: The heart size and vascularity are normal.  Minimal atelectasis at the left base.  Right lung is clear.  No pneumothorax.  No acute osseous abnormality.  IMPRESSION: Minimal atelectasis at the left base laterally.  Original Report Authenticated By: Gwynn Burly, M.D.    Other results: EKG: Sinus tachycardia.  Fast rate, regular rhythm.  Normal axis.  No definite ST or T abnormalities.     Assessment & Plan by Problem: Mr. Tousley is a 70 year old gentleman with a history  of DM and HLD in the hospital recovering from anterior approach cervical diskectomy and fusion yesterday 10/29/10 who experienced SOB, fever, tachycardia, diaphoresis, and rash beginning this afternoon.  Currently, the fever, tachycardia, and SOB have all improved but remain to lesser extents.    Fever/Tachycardia/SOB/Diaphoresis: Fairly sudden onset early this evening.  New leukocytosis present.  On interview and exam, patient has significant secretions in his throat and frequent throat clearing, as well as coarse breath sounds on lung exam.  This is suspicious for ventilator associated pneumonia in the postoperative setting, despite a CXRay only showing minimal atelectasis in L lung base.  With concurrent rash with same time of onset, allergic reaction is a possible etiology of his fever and leukocytosis as well.   UTI is another possible etiology of his fever, but no dysuria or hematuria make this less likely.  Pulmonary embolism is also on the differential for this fairly quick onset SOB, diaphoresis, and tachycardia.  Revised Geneva Score of 8  puts him at intermediate risk.  Finally, cardiac etiology is less likely but should also be considered.  He had normal echo and stress test in 2011, and has no chest pain, making a cardiac cause less likely.       Respiratory therapy for CPAP and suctioning Start Vancomycin and Zosyn for empiric coverage.  D/c Rocephin. Will add differential to recent CBC drawn Repeat PA and Lat CXRay in the AM CTA chest to rule out PE Blood cultures x 2 before administering any more antiobiotics Cardiac Enzymes x 1 Pro BNP UA and Urine Culture   Rash:  Erythematous papular rash on chest arms and back that developed at same time as above symptoms.  Associated itching.  Timing of rash and sudden onset is suspicious for allergic reaction.  This could also be a cause of his above symptoms/signs as well.  Given history of itching from narcotics and frequent 2mg  IV dilaudid doses he has been receiving here, dilaudid allergy is the most likely etiology.  Family does not yet feel comfortable with complete PO pain control, since he has had significant postop pain.    Decrease Dilaudid to 1mg  IV Q4HPRN Solumedrol 120mg  IV x 1 now Benadryl 25mg  IV Q6H scheduled Topical Sarna Lotion   Obstructive Sleep Apnea: Will consult respiratory therapy for CPAP.   HLD: Continue statin.   DM: Glucose under great control on moderate SSI and home oral medications.  Has barely needed any insulin.  However, will keep moderate sliding scale since steroids given may increase insulin need.   DVT Prophylaxis: Continue SCDs at all times       R2/3______________________________      R1________________________________  ATTENDING: I performed and/or observed a history and physical examination of the patient.  I discussed the case with the residents as noted and reviewed the residents' notes.  I agree with the findings and plan--please refer to the attending physician note for more  details.  Signature________________________________  Printed Name_____________________________

## 2010-10-31 ENCOUNTER — Inpatient Hospital Stay (HOSPITAL_COMMUNITY): Payer: Medicare Other

## 2010-10-31 DIAGNOSIS — I10 Essential (primary) hypertension: Secondary | ICD-10-CM

## 2010-10-31 DIAGNOSIS — R000 Tachycardia, unspecified: Secondary | ICD-10-CM

## 2010-10-31 LAB — CBC
HCT: 39.7 % (ref 39.0–52.0)
HCT: 40.9 % (ref 39.0–52.0)
Hemoglobin: 13.8 g/dL (ref 13.0–17.0)
Hemoglobin: 14.1 g/dL (ref 13.0–17.0)
MCH: 34 pg (ref 26.0–34.0)
MCH: 33.3 pg (ref 26.0–34.0)
MCHC: 34.8 g/dL (ref 30.0–36.0)
MCHC: 34.5 g/dL (ref 30.0–36.0)
MCV: 97.8 fL (ref 78.0–100.0)
MCV: 96.7 fL (ref 78.0–100.0)
Platelets: 196 10*3/uL (ref 150–400)
Platelets: 226 10*3/uL (ref 150–400)
RBC: 4.06 MIL/uL — ABNORMAL LOW (ref 4.22–5.81)
RBC: 4.23 MIL/uL (ref 4.22–5.81)
RDW: 13.3 % (ref 11.5–15.5)
RDW: 13.3 % (ref 11.5–15.5)
WBC: 21.3 10*3/uL — ABNORMAL HIGH (ref 4.0–10.5)
WBC: 23.7 10*3/uL — ABNORMAL HIGH (ref 4.0–10.5)

## 2010-10-31 LAB — DIFFERENTIAL
Basophils Absolute: 0 10*3/uL (ref 0.0–0.1)
Basophils Absolute: 0 10*3/uL (ref 0.0–0.1)
Basophils Relative: 0 % (ref 0–1)
Basophils Relative: 0 % (ref 0–1)
Eosinophils Absolute: 0.1 10*3/uL (ref 0.0–0.7)
Eosinophils Absolute: 0 10*3/uL (ref 0.0–0.7)
Eosinophils Relative: 1 % (ref 0–5)
Eosinophils Relative: 0 % (ref 0–5)
Lymphocytes Relative: 5 % — ABNORMAL LOW (ref 12–46)
Lymphocytes Relative: 3 % — ABNORMAL LOW (ref 12–46)
Lymphs Abs: 0.8 10*3/uL (ref 0.7–4.0)
Lymphs Abs: 0.7 10*3/uL (ref 0.7–4.0)
Monocytes Absolute: 1.5 10*3/uL — ABNORMAL HIGH (ref 0.1–1.0)
Monocytes Absolute: 1.3 10*3/uL — ABNORMAL HIGH (ref 0.1–1.0)
Monocytes Relative: 8 % (ref 3–12)
Monocytes Relative: 5 % (ref 3–12)
Neutro Abs: 15.3 10*3/uL — ABNORMAL HIGH (ref 1.7–7.7)
Neutro Abs: 21.8 10*3/uL — ABNORMAL HIGH (ref 1.7–7.7)
Neutrophils Relative %: 86 % — ABNORMAL HIGH (ref 43–77)
Neutrophils Relative %: 92 % — ABNORMAL HIGH (ref 43–77)

## 2010-10-31 LAB — CARDIAC PANEL(CRET KIN+CKTOT+MB+TROPI)
CK, MB: 2.1 ng/mL (ref 0.3–4.0)
CK, MB: 2.4 ng/mL (ref 0.3–4.0)
Relative Index: 0.4 (ref 0.0–2.5)
Relative Index: 0.6 (ref 0.0–2.5)
Total CK: 501 U/L — ABNORMAL HIGH (ref 7–232)
Total CK: 404 U/L — ABNORMAL HIGH (ref 7–232)
Troponin I: <0.3 ng/mL (ref <0.30)
Troponin I: <0.3 ng/mL (ref <0.30)

## 2010-10-31 LAB — BASIC METABOLIC PANEL
BUN: 9 mg/dL (ref 6–23)
CO2: 28 mEq/L (ref 19–32)
Calcium: 9.7 mg/dL (ref 8.4–10.5)
Chloride: 102 mEq/L (ref 96–112)
Creatinine, Ser: 0.81 mg/dL (ref 0.50–1.35)
GFR calc Af Amer: >60 mL/min (ref >60)
GFR calc non Af Amer: >60 mL/min (ref >60)
Glucose, Bld: 203 mg/dL — ABNORMAL HIGH (ref 70–99)
Potassium: 3.8 mEq/L (ref 3.5–5.1)
Sodium: 139 mEq/L (ref 135–145)

## 2010-10-31 LAB — URINE CULTURE
Colony Count: NO GROWTH
Culture  Setup Time: 201209212337
Culture: NO GROWTH

## 2010-10-31 LAB — GLUCOSE, CAPILLARY
Glucose-Capillary: 207 mg/dL — ABNORMAL HIGH (ref 70–99)
Glucose-Capillary: 166 mg/dL — ABNORMAL HIGH (ref 70–99)
Glucose-Capillary: 147 mg/dL — ABNORMAL HIGH (ref 70–99)
Glucose-Capillary: 117 mg/dL — ABNORMAL HIGH (ref 70–99)

## 2010-10-31 MED ORDER — IOHEXOL 300 MG/ML  SOLN
80.0000 mL | Freq: Once | INTRAMUSCULAR | Status: AC | PRN
  Administered 2010-10-31: 80 mL via INTRAVENOUS

## 2010-11-01 LAB — BASIC METABOLIC PANEL
BUN: 17 mg/dL (ref 6–23)
CO2: 28 mEq/L (ref 19–32)
Calcium: 9.2 mg/dL (ref 8.4–10.5)
Chloride: 105 mEq/L (ref 96–112)
Creatinine, Ser: 0.94 mg/dL (ref 0.50–1.35)
GFR calc Af Amer: >60 mL/min (ref >60)
GFR calc non Af Amer: >60 mL/min (ref >60)
Glucose, Bld: 117 mg/dL — ABNORMAL HIGH (ref 70–99)
Potassium: 3.3 mEq/L — ABNORMAL LOW (ref 3.5–5.1)
Sodium: 142 mEq/L (ref 135–145)

## 2010-11-01 LAB — DIFFERENTIAL
Basophils Absolute: 0 10*3/uL (ref 0.0–0.1)
Basophils Relative: 0 % (ref 0–1)
Eosinophils Absolute: 0.7 10*3/uL (ref 0.0–0.7)
Eosinophils Relative: 5 % (ref 0–5)
Lymphocytes Relative: 9 % — ABNORMAL LOW (ref 12–46)
Lymphs Abs: 1.2 10*3/uL (ref 0.7–4.0)
Monocytes Absolute: 1.2 10*3/uL — ABNORMAL HIGH (ref 0.1–1.0)
Monocytes Relative: 9 % (ref 3–12)
Neutro Abs: 10 10*3/uL — ABNORMAL HIGH (ref 1.7–7.7)
Neutrophils Relative %: 76 % (ref 43–77)

## 2010-11-01 LAB — GLUCOSE, CAPILLARY
Glucose-Capillary: 112 mg/dL — ABNORMAL HIGH (ref 70–99)
Glucose-Capillary: 110 mg/dL — ABNORMAL HIGH (ref 70–99)
Glucose-Capillary: 100 mg/dL — ABNORMAL HIGH (ref 70–99)
Glucose-Capillary: 130 mg/dL — ABNORMAL HIGH (ref 70–99)

## 2010-11-01 LAB — CBC
HCT: 38.4 % — ABNORMAL LOW (ref 39.0–52.0)
Hemoglobin: 13 g/dL (ref 13.0–17.0)
MCH: 33.8 pg (ref 26.0–34.0)
MCHC: 33.9 g/dL (ref 30.0–36.0)
MCV: 99.7 fL (ref 78.0–100.0)
Platelets: 170 10*3/uL (ref 150–400)
RBC: 3.85 MIL/uL — ABNORMAL LOW (ref 4.22–5.81)
RDW: 13.6 % (ref 11.5–15.5)
WBC: 13.1 10*3/uL — ABNORMAL HIGH (ref 4.0–10.5)

## 2010-11-01 LAB — MAGNESIUM: Magnesium: 2.7 mg/dL — ABNORMAL HIGH (ref 1.5–2.5)

## 2010-11-02 LAB — BASIC METABOLIC PANEL
BUN: 16 mg/dL (ref 6–23)
CO2: 27 mEq/L (ref 19–32)
Calcium: 9.3 mg/dL (ref 8.4–10.5)
Chloride: 107 mEq/L (ref 96–112)
Creatinine, Ser: 0.89 mg/dL (ref 0.50–1.35)
GFR calc Af Amer: >60 mL/min (ref >60)
GFR calc non Af Amer: >60 mL/min (ref >60)
Glucose, Bld: 117 mg/dL — ABNORMAL HIGH (ref 70–99)
Potassium: 3.8 mEq/L (ref 3.5–5.1)
Sodium: 143 mEq/L (ref 135–145)

## 2010-11-02 LAB — CBC
HCT: 36 % — ABNORMAL LOW (ref 39.0–52.0)
Hemoglobin: 12.2 g/dL — ABNORMAL LOW (ref 13.0–17.0)
MCH: 33.3 pg (ref 26.0–34.0)
MCHC: 33.9 g/dL (ref 30.0–36.0)
MCV: 98.4 fL (ref 78.0–100.0)
Platelets: 228 10*3/uL (ref 150–400)
RBC: 3.66 MIL/uL — ABNORMAL LOW (ref 4.22–5.81)
RDW: 13.3 % (ref 11.5–15.5)
WBC: 8.1 10*3/uL (ref 4.0–10.5)

## 2010-11-02 LAB — GLUCOSE, CAPILLARY: Glucose-Capillary: 128 mg/dL — ABNORMAL HIGH (ref 70–99)

## 2010-11-02 LAB — VANCOMYCIN, TROUGH: Vancomycin Tr: 15.4 ug/mL (ref 10.0–20.0)

## 2010-11-03 NOTE — Op Note (Signed)
NAMERHYDER, KOEGEL NO.:  [id number]  MEDICAL RECORD NO.:  [id number]  LOCATION:                                 FACILITY:  PHYSICIAN:  Alvy Beal, MD    DATE OF BIRTH:  05/24/1940  DATE OF PROCEDURE:  10/29/2010 DATE OF DISCHARGE:                              OPERATIVE REPORT   PREOPERATIVE DIAGNOSIS:  Cervical spondylotic radiculopathy C4-5, C5-6, and C6-7.  POSTOPERATIVE DIAGNOSIS:  Cervical spondylotic radiculopathy C4-5, C5-6, and C6-7.  OPERATIVE PROCEDURE:  Anterior cervical diskectomy and fusion C4-7, instrumentation system used with a titanium intervertebral graft at C4-5 and placed a 5 medium lordotic packed with DBX mix at C5-6 and C6-7 and put 6-mm lordotic spacers packed with DBX mix with a 51-mm anterior cervical Synthes vector plate with self-drilling, self-locking screws.  COMPLICATIONS:  None.  FIRST ASSISTANT:  Norval Gable, PA  HISTORY:  This is a very pleasant 70 year old gentleman who presented to my office with complaints of severe debilitating right arm, neck pain. Attempts at conservative management have failed to alleviate his symptoms.  The patient's clinical exam consisted of radiculopathy with significant severe foraminal stenosis and degenerative disk disease, multilevel.  Because of the failure of conservative management, he elected to proceed with surgery.  All appropriate risks, benefits, and alternatives were discussed.  Consent was obtained.  OPERATIVE NOTE:  The patient was brought to the operating room, placed supine on the operating table.  After successful induction of general anesthesia and endotracheal intubation, TEDs, SCDs, and Foley were inserted.  The anterior cervical spine was prepped and draped in usual sterile fashion.  Time-out was done according to all appropriate protocol.  The roller towels were placed between the shoulder blades, shoulder taped down the anterior cervical spine and was  prepped and draped in standard fashion.  A left longitudinal incision was made over the medial border of the sternocleidomastoid and a standard anterior Smith-Robinson approach to the cervical spine was taken.  I dissected sharply down to the platysma.  This was sharply incised and allowed skin incision.  I dissected down the medial border of the sternocleidomastoid, so identified the omohyoid muscle.  This was sacrificed and I continued to sharply dissect into the deep cervical fascia until I could visualize and protect the carotid sheath laterally with a finger and sweep the trachea medially and protect it with an appendiceal retractor.  I then was into the prevertebral and eventually anterior longitudinal ligament.  I exposed from the C4-C7 and then placed a needle to the C4-5 disk space, took an x-ray and confirmed I was at the appropriate level.  Once this was done, I elected to start the bottom of the incision, so I mobilized the longus colli muscles from the midbody of C4 to midbody of C7 bilaterally and then placed self- retaining retractor, Caspar blades underneath the longus colli muscles and exposed the 6-7 disk space.  I then placed distraction pins into the bodies of 6 and 7, distracted the space, and proceeded with the diskectomy.  An annulotomy was performed a 15 blade scalpel and using a combination of pituitary rongeurs, curettes, and Kerrison rongeurs, I removed  all the disk material.  I then developed a plane underneath the posterior annulus and posterior longitudinal ligament with a micro nerve hook and swept out a hard disk osteophyte as on the right side.  I then used a 1- mm Kerrison to resect the remaining posterior longitudinal ligament until I had an adequate decompression.  I rasped the endplates, measured with trial devices, and placed a 6-mm intervertebral graft packed with DBX mix.  I initially placed a large, but I felt as though it was too long, so I  removed it and placed a medium.  I then repositioned the retractors at the 5-6 space, repositioned the distraction pins at C5 and C6, and then using the same technique performed another diskectomy at C5- 6.  Again, I was able to mobilize the posterior annulus and remove the hard disk osteophyte from underneath the uncovertebral joints and get a good sized medium 6-mm graft in place.  I then repositioned the pins again at C4-5 and the retractor and performed a similar diskectomy. However, there was advanced degeneration and so I placed a 5-mm and this was the better fit.  At this point, with all intervertebral graft placed and the decompressions complete, I contoured an anterior cervical plate and affixed it to the bodies of C4, 5, 6 and 7 with appropriate choice. I got good fixation with all screws and had good final x-rays.  I irrigated copiously with normal saline, made sure I had hemostasis using bipolar electrocautery.  I then checked the esophagus to make sure that it did not become entrapped beneath the construct and I then returned to the trachea and swept this to midline, closed the platysma with a 2-0 Vicryl suture and 3-0 Monocryl for the skin.  Steri-Strips, dry dressing were applied.  The patient was extubated, transferred to PACU without incident.  At the end of the case, all needle and sponge counts were correct.  My first assistant, Norval Gable, was instrumental and assisting me by holding retractors, irrigating, sucking, removing debris and closure.     Alvy Beal, MD     DDB/MEDQ  D:  10/29/2010  T:  10/29/2010  Job:  [id number]  Electronically Signed by Venita Lick MD on 11/03/2010 08:46:08 AM

## 2010-11-06 LAB — CULTURE, BLOOD (ROUTINE X 2)
Culture  Setup Time: 201209220417
Culture  Setup Time: 201209220417
Culture: NO GROWTH
Culture: NO GROWTH

## 2010-11-30 NOTE — Discharge Summary (Signed)
[name], [name] NO.:  [id number]  MEDICAL RECORD NO.:  [id number]  LOCATION:  5016                         FACILITY:  [facility]  PHYSICIAN:  Alvy Beal, MD    DATE OF BIRTH:  November 16, 1940  DATE OF ADMISSION:  10/29/2010 DATE OF DISCHARGE:  11/02/2010                              DISCHARGE SUMMARY   ADMITTING DIAGNOSES: 1. Cervical spondylitic radiculopathy, C4-C5, C5-C6, and C6-C7. 2. Diabetes mellitus. 3. Dyslipidemia. 4. Right knee surgery.  DISCHARGE DIAGNOSES: 1. Cervical spondylitic radiculopathy, C4-C5, C5-C6, and C6-C7, status     post anterior cervical diskectomy and fusion. 2. Subsequent development of a right lower lobe pneumonia. 3. Diabetes mellitus. 4. Dyslipidemia. 5. Right knee surgery.  OPERATIVE PROCEDURE:  Anterior cervical diskectomy and fusion, C4-C7.  SURGEON:  Eaton Folmar D. Shon Baton, MD  FIRS ASSISTANT:  Norval Gable, PA-C.  COMPLICATIONS:  None.  CONSULTATIONS:  Internal Medicine.  BRIEF HISTORY:  [name] is a 70 year old gentleman who presented to the office with complaints of severe debilitating right arm and neck pain.  Attempts at conservative management, including observation therapy, physical therapy, activity modifications, oral pain medications, and injection therapy have failed to alleviate his symptoms.  The patient's clinical exam consisted of radiculopathy with significant severe foraminal stenosis and degenerative disk disease at multi levels.  Because of the failure of conservative management, he elected to proceed with surgery.  Risks and benefits were reviewed with the patient by Dr. Shon Baton.  All appropriate alternatives were discussed. Consent was obtained.  MRI dated September 02, 2010 demonstrates C4-C5 mild flattening of the ventral thecal sac and minimal flattening of the ventral cord.  Moderate- to-severe foraminal stenosis.  C5-C6 demonstrates severe foraminal stenosis and at C6-C7 there is  moderate-to-severe right and mild left foraminal stenosis.  HOSPITAL COURSE:  On October 29, 2010, the patient was admitted to [facility] and underwent the above stated procedure without complication.  The patient's condition following the procedure was stable.  He was transferred to the PACU and subsequently to the orthopedic floor.  On postop day 1, the patient was doing well.  He complained only of some incisional neck pain.  The right arm pain had resolved.  He was alert and oriented x3.  His vital signs were stable, and he was afebrile.  The abdomen was soft and nontender.  Neurovascular status of the bilateral upper extremities was intact.  His wound dressings remained clean, dry, and intact.  The plan at that time was to continue further spinal protocol and consider discharge to home later that day or Saturday once he was cleared by Physical Therapy.  Unfortunately, later that evening, the patient reported not feeling well.  He reported increased shortness of breath and it was notified that the patient had a heart rate of 120, respiratory rate of 22, BP of 155/85, and a temperature of 100 down from 103.  Subsequently, a chest x-ray and EKG were ordered.  Also, there was a consult with Internal Medicine.  The patient was started on 1 g of Rocephin IV every 12 hours, and put on continuous pulse ox.  Medicine was consulted to evaluate the patient for possible pulmonary  embolism. A CT angiogram was negative for pulmonary embolism.  The patient was found to have a right lower lobe pneumonia.  He was started on vancomycin and Zosyn per the pharmacy. On postop day 2, the patient reports doing better.  His vital signs were stable, and he was breathing more comfortably.  Neurovascular status in the bilateral upper extremities was intact.  He had good grip strength. He is to continue with his antibiotics per Internal Medicine.  The following study was ordered as the patient  reported difficulty swallowing.  On postop day 3, the patient was doing well.  He had no complaints.  He was alert and oriented x3.  His vital signs were stable and he remained afebrile.  He seemed to be having issues with his sleep apnea, therefore CPAP was applied at night.  Neurovascular status was intact.  There was no radiculopathy.  X-rays were satisfactory.  He will continue with the Zosyn and vancomycin for his pneumonia.  Encouraged to do incentive spirometry.  On postop day 4, the patient was doing well.  Temperature was 99.1.  Blood pressure was 138/77, pulse 74, respirations were 18, and O2 sats were 97% on CPAP machine.  The right lower lobe pneumonia was improving clinically.  His white blood cell count was trending down. We will continue with the vancomycin and Zosyn for today.  Switch to p.o. clindamycin for 14 days as outpatient when ready for discharge.  He will need to repeat chest x-ray in 6-8 weeks with his primary care physician to evaluate for resolution of his pneumonia.  He will continue incentive spirometry.  His following was improving.  There was no change to his neurovascular status.  The wound was clean, dry, and intact.  We will continue observation with the possible discharge on November 02, 2010.  On postop day 5, the patient was doing well.  The shortness of breath was improving.  There was no chest pain.  He was moving his bowels without difficulty.  He was tolerating a regular diet.  His pain was well controlled with oral pain medications.  He was alert and oriented x3.  Afebrile with stable vital signs.  The abdomen was soft and nontender.  His neurovascular status in the bilateral upper extremities with intact.  He has good grip strength bilaterally.  His wound was clean, dry, and intact.  Plan was to discharge later that day with p.o. clindamycin x14 days for the right lower lobe pneumonia.  Follow up with Orthopedics in 2 weeks.  He will also  need to follow up with his primary care physician in 7-10 days for follow up with pneumonia and repeat chest x-rays.  DISCHARGE INSTRUCTIONS:  The patient was discharged to home.  He was instructed to follow up with Dr. Shon Baton in 2 weeks for wound check and evaluation.  He was also instructed to follow up with his primary care physician in 7-10 days for re-evaluation of his pneumonia with repeat chest x-ray.  DISCHARGE MEDICATIONS: 1. Colace 100 mg. 2. Milk of magnesia. 3. Percocet 10/325. 4. Robaxin 500 mg. 5. Zofran 4 mg. 6. Albuterol 90 mcg aerosol inhaler. 7. Chloraseptic spray. 8. Clindamycin 300 mg. 9. Guaifenesin 600 mg.  Preprinted discharge instructions were provided to the patient at the time of discharge.  He understands.  All the patient's questions were encouraged, addressed, and answered.  CONDITION:  At the time of discharge was considered stable.    ______________________________ Norval Gable, PA   ______________________________ Donn Pierini  Shon Baton, MD    DK/MEDQ  D:  11/24/2010  T:  11/24/2010  Job:  [id number]  Electronically Signed by Norval Gable PA on 11/25/2010 07:12:39 AM Electronically Signed by Venita Lick MD on 11/30/2010 11:03:15 AM

## 2011-03-09 ENCOUNTER — Other Ambulatory Visit: Payer: Self-pay | Admitting: Orthopedic Surgery

## 2011-03-09 DIAGNOSIS — M545 Low back pain: Secondary | ICD-10-CM

## 2011-03-10 ENCOUNTER — Other Ambulatory Visit: Payer: Self-pay | Admitting: Orthopedic Surgery

## 2011-03-10 DIAGNOSIS — M545 Low back pain: Secondary | ICD-10-CM

## 2011-03-11 ENCOUNTER — Ambulatory Visit
Admission: RE | Admit: 2011-03-11 | Discharge: 2011-03-11 | Disposition: A | Discharge status: Home / Self Care | Payer: Medicare Other | Source: Ambulatory Visit | Attending: Orthopedic Surgery | Admitting: Orthopedic Surgery

## 2011-03-11 ENCOUNTER — Other Ambulatory Visit: Payer: Self-pay | Admitting: Orthopedic Surgery

## 2011-03-11 DIAGNOSIS — M545 Low back pain: Secondary | ICD-10-CM

## 2011-03-11 DIAGNOSIS — M549 Dorsalgia, unspecified: Secondary | ICD-10-CM

## 2011-03-11 MED ORDER — SODIUM CHLORIDE 0.9 % IV SOLN
Freq: Once | INTRAVENOUS | Status: AC
  Administered 2011-03-11: 07:00:00 via INTRAVENOUS

## 2011-03-11 MED ORDER — IOHEXOL 180 MG/ML  SOLN
6.0000 mL | Freq: Once | INTRAMUSCULAR | Status: AC | PRN
  Administered 2011-03-11: 6 mL

## 2011-03-11 MED ORDER — FENTANYL CITRATE 0.05 MG/ML IJ SOLN
25.0000 ug | INTRAMUSCULAR | Status: DC | PRN
  Administered 2011-03-11: 50 ug via INTRAVENOUS

## 2011-03-11 MED ORDER — CEFAZOLIN SODIUM 1-5 GM-% IV SOLN
1.0000 g | Freq: Once | INTRAVENOUS | Status: AC
  Administered 2011-03-11: 1 g via INTRAVENOUS

## 2011-03-11 MED ORDER — MIDAZOLAM HCL 2 MG/2ML IJ SOLN
1.0000 mg | INTRAMUSCULAR | Status: DC | PRN
  Administered 2011-03-11 (×2): 1 mg via INTRAVENOUS

## 2011-03-11 MED ORDER — KETOROLAC TROMETHAMINE 30 MG/ML IJ SOLN
30.0000 mg | Freq: Once | INTRAMUSCULAR | Status: AC
  Administered 2011-03-11: 30 mg via INTRAVENOUS

## 2011-03-11 NOTE — Progress Notes (Addendum)
Explained discharge instructions to pt, lungs clear, heart sounds regular, consent signed and IV to be started.  Pt c/o left leg pain.

## 2011-03-12 ENCOUNTER — Other Ambulatory Visit: Payer: Medicare Other

## 2011-07-21 ENCOUNTER — Other Ambulatory Visit: Payer: Self-pay | Admitting: Neurological Surgery

## 2011-07-21 DIAGNOSIS — M5137 Other intervertebral disc degeneration, lumbosacral region: Secondary | ICD-10-CM

## 2011-07-21 DIAGNOSIS — M545 Low back pain: Secondary | ICD-10-CM

## 2011-07-25 ENCOUNTER — Ambulatory Visit
Admission: RE | Admit: 2011-07-25 | Discharge: 2011-07-25 | Disposition: A | Discharge status: Home / Self Care | Payer: Medicare Other | Source: Ambulatory Visit | Attending: Neurological Surgery | Admitting: Neurological Surgery

## 2011-07-25 DIAGNOSIS — M545 Low back pain: Secondary | ICD-10-CM

## 2011-07-25 DIAGNOSIS — M5137 Other intervertebral disc degeneration, lumbosacral region: Secondary | ICD-10-CM

## 2011-10-15 ENCOUNTER — Other Ambulatory Visit: Payer: Self-pay | Admitting: Neurological Surgery

## 2011-11-18 ENCOUNTER — Encounter (HOSPITAL_COMMUNITY): Payer: Self-pay | Admitting: Pharmacy Technician

## 2011-11-25 NOTE — Pre-Procedure Instructions (Signed)
20 [name]  11/25/2011   Your procedure is scheduled on:  Thurs, Oct 24 @ 7:30 AM  Report to [facility] at 5:30 AM.  Call this number if you have problems the morning of surgery: [phone]   Remember:   Do not eat food:After Midnight.    Take these medicines the morning of surgery with A SIP OF WATER: Proscar(Finasteride) and Omprazole(Prilosec)   Do not wear jewelry  Do not wear lotions, powders, or colognes. You may wear deodorant.  Men may shave face and neck.  Do not bring valuables to the hospital.  Contacts, dentures or bridgework may not be worn into surgery.  Leave suitcase in the car. After surgery it may be brought to your room.  For patients admitted to the hospital, checkout time is 11:00 AM the day of discharge.   Patients discharged the day of surgery will not be allowed to drive home.  Special Instructions: Shower using CHG 2 nights before surgery and the night before surgery.  If you shower the day of surgery use CHG.  Use special wash - you have one bottle of CHG for all showers.  You should use approximately 1/3 of the bottle for each shower.   Please read over the following fact sheets that you were given: Pain Booklet, Coughing and Deep Breathing, Blood Transfusion Information, MRSA Information and Surgical Site Infection Prevention

## 2011-11-26 ENCOUNTER — Encounter (HOSPITAL_COMMUNITY): Payer: Self-pay

## 2011-11-26 ENCOUNTER — Encounter (HOSPITAL_COMMUNITY)
Admission: RE | Admit: 2011-11-26 | Discharge: 2011-11-26 | Disposition: A | Discharge status: Home / Self Care | Payer: Medicare Other | Source: Ambulatory Visit | Attending: Neurological Surgery | Admitting: Neurological Surgery

## 2011-11-26 HISTORY — DX: Effusion, unspecified joint: M25.40

## 2011-11-26 HISTORY — DX: Duodenal ulcer, unspecified as acute or chronic, without hemorrhage or perforation: K26.9

## 2011-11-26 HISTORY — DX: Hyperlipidemia, unspecified: E78.5

## 2011-11-26 HISTORY — DX: Dysphagia, unspecified: R13.10

## 2011-11-26 HISTORY — DX: Gastro-esophageal reflux disease without esophagitis: K21.9

## 2011-11-26 HISTORY — DX: Pain in unspecified joint: M25.50

## 2011-11-26 HISTORY — DX: Benign prostatic hyperplasia without lower urinary tract symptoms: N40.0

## 2011-11-26 HISTORY — DX: Unspecified osteoarthritis, unspecified site: M19.90

## 2011-11-26 HISTORY — DX: Insomnia, unspecified: G47.00

## 2011-11-26 HISTORY — DX: Pneumonia, unspecified organism: J18.9

## 2011-11-26 HISTORY — DX: Duodenitis with bleeding: K29.81

## 2011-11-26 LAB — PROTIME-INR
INR: 0.98 (ref 0.00–1.49)
Prothrombin Time: 12.9 seconds (ref 11.6–15.2)

## 2011-11-26 LAB — CBC WITH DIFFERENTIAL/PLATELET
Basophils Absolute: 0 10*3/uL (ref 0.0–0.1)
Basophils Relative: 1 % (ref 0–1)
Eosinophils Absolute: 0.4 10*3/uL (ref 0.0–0.7)
Eosinophils Relative: 6 % — ABNORMAL HIGH (ref 0–5)
HCT: 40.6 % (ref 39.0–52.0)
Hemoglobin: 14 g/dL (ref 13.0–17.0)
Lymphocytes Relative: 21 % (ref 12–46)
Lymphs Abs: 1.6 10*3/uL (ref 0.7–4.0)
MCH: 34.1 pg — ABNORMAL HIGH (ref 26.0–34.0)
MCHC: 34.5 g/dL (ref 30.0–36.0)
MCV: 99 fL (ref 78.0–100.0)
Monocytes Absolute: 0.8 10*3/uL (ref 0.1–1.0)
Monocytes Relative: 11 % (ref 3–12)
Neutro Abs: 4.6 10*3/uL (ref 1.7–7.7)
Neutrophils Relative %: 62 % (ref 43–77)
Platelets: 224 10*3/uL (ref 150–400)
RBC: 4.1 MIL/uL — ABNORMAL LOW (ref 4.22–5.81)
RDW: 13.1 % (ref 11.5–15.5)
WBC: 7.5 10*3/uL (ref 4.0–10.5)

## 2011-11-26 LAB — BASIC METABOLIC PANEL
BUN: 20 mg/dL (ref 6–23)
CO2: 25 mEq/L (ref 19–32)
Calcium: 9.3 mg/dL (ref 8.4–10.5)
Chloride: 104 mEq/L (ref 96–112)
Creatinine, Ser: 0.78 mg/dL (ref 0.50–1.35)
GFR calc Af Amer: >90 mL/min (ref >90)
GFR calc non Af Amer: 89 mL/min — ABNORMAL LOW (ref >90)
Glucose, Bld: 124 mg/dL — ABNORMAL HIGH (ref 70–99)
Potassium: 4.2 mEq/L (ref 3.5–5.1)
Sodium: 139 mEq/L (ref 135–145)

## 2011-11-26 LAB — SURGICAL PCR SCREEN
MRSA, PCR: POSITIVE — AB
Staphylococcus aureus: POSITIVE — AB

## 2011-11-26 LAB — APTT: aPTT: 36 seconds (ref 24–37)

## 2011-11-26 NOTE — Progress Notes (Signed)
Sleep study in epic from 2002-2003

## 2011-11-26 NOTE — Progress Notes (Signed)
Saw a cardiologist several yrs ago with [facility] d/t some discomfort  Echo done a couple of yrs ago to request from [facility] Denies ever having a heart cath or stress test  Dr.James Selena Batten is Medical MD  Denies EKG or CXR being done w/i past yr

## 2011-12-01 MED ORDER — CEFAZOLIN SODIUM-DEXTROSE 2-3 GM-% IV SOLR
2.0000 g | INTRAVENOUS | Status: AC
  Administered 2011-12-02: 2 g via INTRAVENOUS
  Filled 2011-12-01: qty 50

## 2011-12-02 ENCOUNTER — Encounter (HOSPITAL_COMMUNITY)
Admission: RE | Disposition: A | Discharge status: Home / Self Care | Payer: Self-pay | Source: Ambulatory Visit | Attending: Neurological Surgery

## 2011-12-02 ENCOUNTER — Inpatient Hospital Stay (HOSPITAL_COMMUNITY)
Admission: RE | Admit: 2011-12-02 | Discharge: 2011-12-03 | DRG: 460 | Disposition: A | Discharge status: Home / Self Care | Payer: Medicare Other | Source: Ambulatory Visit | Attending: Neurological Surgery | Admitting: Neurological Surgery

## 2011-12-02 ENCOUNTER — Encounter (HOSPITAL_COMMUNITY): Payer: Self-pay

## 2011-12-02 ENCOUNTER — Inpatient Hospital Stay (HOSPITAL_COMMUNITY): Payer: Medicare Other | Admitting: Anesthesiology

## 2011-12-02 ENCOUNTER — Encounter (HOSPITAL_COMMUNITY): Payer: Self-pay | Admitting: Anesthesiology

## 2011-12-02 ENCOUNTER — Encounter (HOSPITAL_COMMUNITY): Payer: Self-pay | Admitting: Surgery

## 2011-12-02 ENCOUNTER — Inpatient Hospital Stay (HOSPITAL_COMMUNITY): Payer: Medicare Other

## 2011-12-02 DIAGNOSIS — Z01818 Encounter for other preprocedural examination: Secondary | ICD-10-CM

## 2011-12-02 DIAGNOSIS — Z01812 Encounter for preprocedural laboratory examination: Secondary | ICD-10-CM

## 2011-12-02 DIAGNOSIS — Z79899 Other long term (current) drug therapy: Secondary | ICD-10-CM

## 2011-12-02 DIAGNOSIS — E785 Hyperlipidemia, unspecified: Secondary | ICD-10-CM | POA: Diagnosis present

## 2011-12-02 DIAGNOSIS — M47817 Spondylosis without myelopathy or radiculopathy, lumbosacral region: Secondary | ICD-10-CM | POA: Diagnosis present

## 2011-12-02 DIAGNOSIS — G4730 Sleep apnea, unspecified: Secondary | ICD-10-CM | POA: Diagnosis present

## 2011-12-02 DIAGNOSIS — E119 Type 2 diabetes mellitus without complications: Secondary | ICD-10-CM | POA: Diagnosis present

## 2011-12-02 DIAGNOSIS — Z981 Arthrodesis status: Secondary | ICD-10-CM

## 2011-12-02 DIAGNOSIS — M51379 Other intervertebral disc degeneration, lumbosacral region without mention of lumbar back pain or lower extremity pain: Principal | ICD-10-CM | POA: Diagnosis present

## 2011-12-02 DIAGNOSIS — Z23 Encounter for immunization: Secondary | ICD-10-CM

## 2011-12-02 DIAGNOSIS — Z7982 Long term (current) use of aspirin: Secondary | ICD-10-CM

## 2011-12-02 DIAGNOSIS — K219 Gastro-esophageal reflux disease without esophagitis: Secondary | ICD-10-CM | POA: Diagnosis present

## 2011-12-02 DIAGNOSIS — Z96659 Presence of unspecified artificial knee joint: Secondary | ICD-10-CM

## 2011-12-02 DIAGNOSIS — N400 Enlarged prostate without lower urinary tract symptoms: Secondary | ICD-10-CM | POA: Diagnosis present

## 2011-12-02 DIAGNOSIS — Q762 Congenital spondylolisthesis: Secondary | ICD-10-CM

## 2011-12-02 DIAGNOSIS — M129 Arthropathy, unspecified: Secondary | ICD-10-CM | POA: Diagnosis present

## 2011-12-02 DIAGNOSIS — Z01810 Encounter for preprocedural cardiovascular examination: Secondary | ICD-10-CM

## 2011-12-02 DIAGNOSIS — M5137 Other intervertebral disc degeneration, lumbosacral region: Principal | ICD-10-CM | POA: Diagnosis present

## 2011-12-02 DIAGNOSIS — Z87891 Personal history of nicotine dependence: Secondary | ICD-10-CM

## 2011-12-02 HISTORY — PX: ANTERIOR LAT LUMBAR FUSION: SHX1168

## 2011-12-02 LAB — GLUCOSE, CAPILLARY
Glucose-Capillary: 123 mg/dL — ABNORMAL HIGH (ref 70–99)
Glucose-Capillary: 147 mg/dL — ABNORMAL HIGH (ref 70–99)
Glucose-Capillary: 180 mg/dL — ABNORMAL HIGH (ref 70–99)

## 2011-12-02 SURGERY — ANTERIOR LATERAL LUMBAR FUSION 1 LEVEL
Anesthesia: General | Laterality: Left | Wound class: Clean

## 2011-12-02 MED ORDER — SENNA 8.6 MG PO TABS
1.0000 | ORAL_TABLET | Freq: Two times a day (BID) | ORAL | Status: DC
  Administered 2011-12-02 – 2011-12-03 (×2): 8.6 mg via ORAL
  Filled 2011-12-02 (×3): qty 1

## 2011-12-02 MED ORDER — MORPHINE SULFATE 2 MG/ML IJ SOLN
1.0000 mg | INTRAMUSCULAR | Status: DC | PRN
  Administered 2011-12-02: 4 mg via INTRAVENOUS
  Administered 2011-12-02: 2 mg via INTRAVENOUS
  Administered 2011-12-02: 4 mg via INTRAVENOUS
  Administered 2011-12-02: 2 mg via INTRAVENOUS
  Administered 2011-12-03 (×3): 4 mg via INTRAVENOUS
  Filled 2011-12-02 (×3): qty 2
  Filled 2011-12-02 (×2): qty 1
  Filled 2011-12-02: qty 2
  Filled 2011-12-02 (×2): qty 1

## 2011-12-02 MED ORDER — EPHEDRINE SULFATE 50 MG/ML IJ SOLN
INTRAMUSCULAR | Status: DC | PRN
  Administered 2011-12-02: 15 mg via INTRAVENOUS
  Administered 2011-12-02: 10 mg via INTRAVENOUS

## 2011-12-02 MED ORDER — LIDOCAINE HCL 4 % MT SOLN
OROMUCOSAL | Status: DC | PRN
  Administered 2011-12-02: 4 mL via TOPICAL

## 2011-12-02 MED ORDER — HYDROMORPHONE HCL PF 1 MG/ML IJ SOLN
INTRAMUSCULAR | Status: AC
  Administered 2011-12-02: 0.5 mg via INTRAVENOUS
  Filled 2011-12-02: qty 1

## 2011-12-02 MED ORDER — FENTANYL CITRATE 0.05 MG/ML IJ SOLN
INTRAMUSCULAR | Status: DC | PRN
  Administered 2011-12-02: 150 ug via INTRAVENOUS
  Administered 2011-12-02: 50 ug via INTRAVENOUS

## 2011-12-02 MED ORDER — SODIUM CHLORIDE 0.9 % IV SOLN
INTRAVENOUS | Status: AC
  Filled 2011-12-02: qty 500

## 2011-12-02 MED ORDER — LIDOCAINE HCL (CARDIAC) 20 MG/ML IV SOLN
INTRAVENOUS | Status: DC | PRN
  Administered 2011-12-02: 90 mg via INTRAVENOUS

## 2011-12-02 MED ORDER — ONDANSETRON HCL 4 MG/2ML IJ SOLN: 4.0000 mg | Freq: Once | INTRAMUSCULAR | Status: DC | PRN

## 2011-12-02 MED ORDER — ACETAMINOPHEN 10 MG/ML IV SOLN
1000.0000 mg | Freq: Four times a day (QID) | INTRAVENOUS | Status: AC
  Administered 2011-12-02 – 2011-12-03 (×3): 1000 mg via INTRAVENOUS
  Filled 2011-12-02 (×6): qty 100

## 2011-12-02 MED ORDER — ONDANSETRON HCL 4 MG/2ML IJ SOLN
INTRAMUSCULAR | Status: DC | PRN
  Administered 2011-12-02: 4 mg via INTRAVENOUS

## 2011-12-02 MED ORDER — BUPIVACAINE HCL (PF) 0.25 % IJ SOLN
INTRAMUSCULAR | Status: DC | PRN
  Administered 2011-12-02: 3 mL

## 2011-12-02 MED ORDER — PANTOPRAZOLE SODIUM 40 MG PO TBEC
40.0000 mg | DELAYED_RELEASE_TABLET | Freq: Every day | ORAL | Status: DC
  Administered 2011-12-03: 40 mg via ORAL
  Filled 2011-12-02: qty 1

## 2011-12-02 MED ORDER — OXYCODONE HCL 5 MG PO TABS
5.0000 mg | ORAL_TABLET | ORAL | Status: DC | PRN
  Administered 2011-12-02 – 2011-12-03 (×6): 5 mg via ORAL
  Filled 2011-12-02 (×6): qty 1

## 2011-12-02 MED ORDER — POTASSIUM CHLORIDE IN NACL 20-0.9 MEQ/L-% IV SOLN
INTRAVENOUS | Status: DC
  Administered 2011-12-02 – 2011-12-03 (×2): via INTRAVENOUS
  Filled 2011-12-02 (×3): qty 1000

## 2011-12-02 MED ORDER — ACETAMINOPHEN 10 MG/ML IV SOLN
INTRAVENOUS | Status: AC
  Administered 2011-12-02: 1000 mg
  Filled 2011-12-02: qty 100

## 2011-12-02 MED ORDER — SUCCINYLCHOLINE CHLORIDE 20 MG/ML IJ SOLN
INTRAMUSCULAR | Status: DC | PRN
  Administered 2011-12-02: 100 mg via INTRAVENOUS

## 2011-12-02 MED ORDER — METHOCARBAMOL 500 MG PO TABS
500.0000 mg | ORAL_TABLET | Freq: Four times a day (QID) | ORAL | Status: DC
  Administered 2011-12-02 – 2011-12-03 (×4): 500 mg via ORAL
  Filled 2011-12-02 (×7): qty 1

## 2011-12-02 MED ORDER — SODIUM CHLORIDE 0.9 % IR SOLN
Status: DC | PRN
  Administered 2011-12-02: 08:00:00

## 2011-12-02 MED ORDER — METFORMIN HCL 500 MG PO TABS
500.0000 mg | ORAL_TABLET | Freq: Two times a day (BID) | ORAL | Status: DC
  Administered 2011-12-02 – 2011-12-03 (×2): 500 mg via ORAL
  Filled 2011-12-02 (×4): qty 1

## 2011-12-02 MED ORDER — GABAPENTIN 300 MG PO CAPS
300.0000 mg | ORAL_CAPSULE | Freq: Three times a day (TID) | ORAL | Status: DC
  Administered 2011-12-02 – 2011-12-03 (×3): 300 mg via ORAL
  Filled 2011-12-02 (×5): qty 1

## 2011-12-02 MED ORDER — HYDROMORPHONE HCL PF 1 MG/ML IJ SOLN
0.2500 mg | INTRAMUSCULAR | Status: AC | PRN
  Administered 2011-12-02 (×2): 0.5 mg via INTRAVENOUS
  Administered 2011-12-02: 12:00:00 via INTRAVENOUS
  Administered 2011-12-02 (×5): 0.5 mg via INTRAVENOUS

## 2011-12-02 MED ORDER — PIOGLITAZONE HCL-METFORMIN HCL 15-500 MG PO TABS: 1.0000 | ORAL_TABLET | Freq: Two times a day (BID) | ORAL | Status: DC

## 2011-12-02 MED ORDER — BACITRACIN 50000 UNITS IM SOLR
INTRAMUSCULAR | Status: AC
  Filled 2011-12-02: qty 1

## 2011-12-02 MED ORDER — CEFAZOLIN SODIUM 1-5 GM-% IV SOLN
1.0000 g | Freq: Three times a day (TID) | INTRAVENOUS | Status: AC
  Administered 2011-12-02 (×2): 1 g via INTRAVENOUS
  Filled 2011-12-02 (×2): qty 50

## 2011-12-02 MED ORDER — SODIUM CHLORIDE 0.9 % IV SOLN: 250.0000 mL | INTRAVENOUS | Status: DC

## 2011-12-02 MED ORDER — PIOGLITAZONE HCL 15 MG PO TABS
15.0000 mg | ORAL_TABLET | Freq: Two times a day (BID) | ORAL | Status: DC
  Administered 2011-12-02 – 2011-12-03 (×2): 15 mg via ORAL
  Filled 2011-12-02 (×4): qty 1

## 2011-12-02 MED ORDER — ACETAMINOPHEN 325 MG PO TABS: 650.0000 mg | ORAL_TABLET | ORAL | Status: DC | PRN

## 2011-12-02 MED ORDER — SODIUM CHLORIDE 0.9 % IJ SOLN
3.0000 mL | Freq: Two times a day (BID) | INTRAMUSCULAR | Status: DC
  Administered 2011-12-02 – 2011-12-03 (×2): 3 mL via INTRAVENOUS

## 2011-12-02 MED ORDER — DEXAMETHASONE SODIUM PHOSPHATE 10 MG/ML IJ SOLN: 10.0000 mg | INTRAMUSCULAR | Status: DC

## 2011-12-02 MED ORDER — HYDROMORPHONE HCL PF 1 MG/ML IJ SOLN
INTRAMUSCULAR | Status: AC
  Filled 2011-12-02: qty 1

## 2011-12-02 MED ORDER — PHENOL 1.4 % MT LIQD: 1.0000 | OROMUCOSAL | Status: DC | PRN

## 2011-12-02 MED ORDER — DEXAMETHASONE 4 MG PO TABS
4.0000 mg | ORAL_TABLET | Freq: Four times a day (QID) | ORAL | Status: DC
  Administered 2011-12-02 – 2011-12-03 (×4): 4 mg via ORAL
  Filled 2011-12-02 (×8): qty 1

## 2011-12-02 MED ORDER — DEXAMETHASONE SODIUM PHOSPHATE 10 MG/ML IJ SOLN
INTRAMUSCULAR | Status: AC
  Administered 2011-12-02: 10 mg via INTRAVENOUS
  Filled 2011-12-02: qty 1

## 2011-12-02 MED ORDER — SODIUM CHLORIDE 0.9 % IJ SOLN: 3.0000 mL | INTRAMUSCULAR | Status: DC | PRN

## 2011-12-02 MED ORDER — PROPOFOL 10 MG/ML IV BOLUS
INTRAVENOUS | Status: DC | PRN
  Administered 2011-12-02: 200 mg via INTRAVENOUS

## 2011-12-02 MED ORDER — 0.9 % SODIUM CHLORIDE (POUR BTL) OPTIME
TOPICAL | Status: DC | PRN
  Administered 2011-12-02: 1000 mL

## 2011-12-02 MED ORDER — ARTIFICIAL TEARS OP OINT
TOPICAL_OINTMENT | OPHTHALMIC | Status: DC | PRN
  Administered 2011-12-02: 1 via OPHTHALMIC

## 2011-12-02 MED ORDER — ACETAMINOPHEN 650 MG RE SUPP: 650.0000 mg | RECTAL | Status: DC | PRN

## 2011-12-02 MED ORDER — ONDANSETRON HCL 4 MG/2ML IJ SOLN: 4.0000 mg | INTRAMUSCULAR | Status: DC | PRN

## 2011-12-02 MED ORDER — FINASTERIDE 5 MG PO TABS
5.0000 mg | ORAL_TABLET | Freq: Every day | ORAL | Status: DC
  Administered 2011-12-03: 5 mg via ORAL
  Filled 2011-12-02: qty 1

## 2011-12-02 MED ORDER — MENTHOL 3 MG MT LOZG: 1.0000 | LOZENGE | OROMUCOSAL | Status: DC | PRN

## 2011-12-02 MED ORDER — PNEUMOCOCCAL VAC POLYVALENT 25 MCG/0.5ML IJ INJ
0.5000 mL | INJECTION | INTRAMUSCULAR | Status: AC
  Administered 2011-12-03: 0.5 mL via INTRAMUSCULAR
  Filled 2011-12-02: qty 0.5

## 2011-12-02 MED ORDER — ASPIRIN 81 MG PO CHEW
81.0000 mg | CHEWABLE_TABLET | Freq: Every day | ORAL | Status: DC
  Administered 2011-12-02 – 2011-12-03 (×2): 81 mg via ORAL
  Filled 2011-12-02 (×2): qty 1

## 2011-12-02 MED ORDER — DEXAMETHASONE SODIUM PHOSPHATE 4 MG/ML IJ SOLN
4.0000 mg | Freq: Four times a day (QID) | INTRAMUSCULAR | Status: DC
  Filled 2011-12-02 (×4): qty 1

## 2011-12-02 MED ORDER — LACTATED RINGERS IV SOLN
INTRAVENOUS | Status: DC | PRN
  Administered 2011-12-02 (×2): via INTRAVENOUS

## 2011-12-02 SURGICAL SUPPLY — 68 items
BAG DECANTER FOR FLEXI CONT (MISCELLANEOUS) ×4 IMPLANT
BENZOIN TINCTURE PRP APPL 2/3 (GAUZE/BANDAGES/DRESSINGS) ×4 IMPLANT
BLADE SURG ROTATE 9660 (MISCELLANEOUS) IMPLANT
BONE MATRIX OSTEOCEL PLUS 5CC (Bone Implant) ×2 IMPLANT
CLIP NEUROVISION LG (CLIP) ×2 IMPLANT
CLOTH BEACON ORANGE TIMEOUT ST (SAFETY) ×4 IMPLANT
CONT SPEC 4OZ CLIKSEAL STRL BL (MISCELLANEOUS) IMPLANT
COVER BACK TABLE 24X17X13 BIG (DRAPES) IMPLANT
COVER TABLE BACK 60X90 (DRAPES) ×2 IMPLANT
DERMABOND ADVANCED (GAUZE/BANDAGES/DRESSINGS) ×1
DERMABOND ADVANCED .7 DNX12 (GAUZE/BANDAGES/DRESSINGS) ×1 IMPLANT
DRAPE C-ARM 42X72 X-RAY (DRAPES) ×4 IMPLANT
DRAPE C-ARMOR (DRAPES) ×4 IMPLANT
DRAPE LAPAROTOMY 100X72X124 (DRAPES) ×4 IMPLANT
DRAPE POUCH INSTRU U-SHP 10X18 (DRAPES) ×2 IMPLANT
DRAPE SURG 17X23 STRL (DRAPES) ×12 IMPLANT
DRESSING TELFA 8X3 (GAUZE/BANDAGES/DRESSINGS) ×4 IMPLANT
DRSG OPSITE 4X5.5 SM (GAUZE/BANDAGES/DRESSINGS) ×6 IMPLANT
DURAPREP 26ML APPLICATOR (WOUND CARE) ×2 IMPLANT
ELECT BLADE 4.0 EZ CLEAN MEGAD (MISCELLANEOUS)
ELECT REM PT RETURN 9FT ADLT (ELECTROSURGICAL) ×4
ELECTRODE BLDE 4.0 EZ CLN MEGD (MISCELLANEOUS) IMPLANT
ELECTRODE REM PT RTRN 9FT ADLT (ELECTROSURGICAL) ×2 IMPLANT
EVACUATOR 1/8 PVC DRAIN (DRAIN) IMPLANT
GAUZE SPONGE 4X4 16PLY XRAY LF (GAUZE/BANDAGES/DRESSINGS) IMPLANT
GLOVE BIO SURGEON STRL SZ8 (GLOVE) ×4 IMPLANT
GLOVE ECLIPSE 7.5 STRL STRAW (GLOVE) ×2 IMPLANT
GLOVE INDICATOR 7.5 STRL GRN (GLOVE) ×2 IMPLANT
GLOVE INDICATOR 8.0 STRL GRN (GLOVE) ×2 IMPLANT
GLOVE OPTIFIT SS 7.5 STRL LX (GLOVE) ×6 IMPLANT
GOWN BRE IMP SLV AUR LG STRL (GOWN DISPOSABLE) ×2 IMPLANT
GOWN BRE IMP SLV AUR XL STRL (GOWN DISPOSABLE) IMPLANT
GOWN STRL REIN 2XL LVL4 (GOWN DISPOSABLE) ×2 IMPLANT
GUIDEWIRE NITINOL BEVEL TIP (WIRE) ×4 IMPLANT
HEMOSTAT POWDER KIT SURGIFOAM (HEMOSTASIS) IMPLANT
IMPLANT COROENT XL 10X22X50MM (Intraocular Lens) ×2 IMPLANT
KIT BASIN OR (CUSTOM PROCEDURE TRAY) ×4 IMPLANT
KIT DILATOR XLIF 5 (KITS) ×1 IMPLANT
KIT MAXCESS (KITS) ×2 IMPLANT
KIT NEEDLE NVM5 EMG ELECT (KITS) ×1 IMPLANT
KIT NEEDLE NVM5 EMG ELECTRODE (KITS) ×1
KIT ROOM TURNOVER OR (KITS) ×4 IMPLANT
KIT XLIF (KITS) ×1
NEEDLE HYPO 22GX1.5 SAFETY (NEEDLE) ×2 IMPLANT
NEEDLE HYPO 25X1 1.5 SAFETY (NEEDLE) ×2 IMPLANT
NEEDLE I PASS (NEEDLE) ×2 IMPLANT
NS IRRIG 1000ML POUR BTL (IV SOLUTION) ×4 IMPLANT
PACK LAMINECTOMY NEURO (CUSTOM PROCEDURE TRAY) ×4 IMPLANT
PUTTY BONE DBX 5CC MIX (Putty) ×2 IMPLANT
ROD 45MM (Rod) ×2 IMPLANT
SCREW PRECEPT 6.5X50 (Screw) ×4 IMPLANT
SCREW PRECEPT SET (Screw) ×4 IMPLANT
SPONGE GAUZE 4X4 12PLY (GAUZE/BANDAGES/DRESSINGS) ×2 IMPLANT
SPONGE LAP 4X18 X RAY DECT (DISPOSABLE) IMPLANT
SPONGE SURGIFOAM ABS GEL SZ50 (HEMOSTASIS) IMPLANT
STAPLER SKIN PROX WIDE 3.9 (STAPLE) IMPLANT
STRIP CLOSURE SKIN 1/2X4 (GAUZE/BANDAGES/DRESSINGS) ×2 IMPLANT
SUT VIC AB 0 CT1 18XCR BRD8 (SUTURE) ×3 IMPLANT
SUT VIC AB 0 CT1 8-18 (SUTURE) ×3
SUT VIC AB 2-0 CP2 18 (SUTURE) ×6 IMPLANT
SUT VIC AB 3-0 SH 8-18 (SUTURE) ×6 IMPLANT
SWABSTICK BENZOIN STERILE (MISCELLANEOUS) ×2 IMPLANT
SYR 20ML ECCENTRIC (SYRINGE) ×4 IMPLANT
TAPE CLOTH 3X10 TAN LF (GAUZE/BANDAGES/DRESSINGS) ×6 IMPLANT
TOWEL OR 17X24 6PK STRL BLUE (TOWEL DISPOSABLE) ×4 IMPLANT
TOWEL OR 17X26 10 PK STRL BLUE (TOWEL DISPOSABLE) ×4 IMPLANT
TRAY FOLEY CATH 14FRSI W/METER (CATHETERS) ×2 IMPLANT
WATER STERILE IRR 1000ML POUR (IV SOLUTION) ×4 IMPLANT

## 2011-12-02 NOTE — Anesthesia Postprocedure Evaluation (Signed)
  Anesthesia Post-op Note  Patient: JAMARIAN JACINTO  Procedure(s) Performed: Procedure(s) (LRB) with comments: ANTERIOR LATERAL LUMBAR FUSION 1 LEVEL (Left) - left lumbar three-four LUMBAR PERCUTANEOUS PEDICLE SCREW 1 LEVEL (Left) - left lumbar three-four  Patient Location: PACU  Anesthesia Type: General  Level of Consciousness: awake, alert , oriented and patient cooperative  Airway and Oxygen Therapy: Patient Spontanous Breathing  Post-op Pain: mild  Post-op Assessment: Post-op Vital signs reviewed, Patient's Cardiovascular Status Stable, Respiratory Function Stable, Patent Airway, No signs of Nausea or vomiting and Pain level controlled  Post-op Vital Signs: stable  Complications: No apparent anesthesia complications

## 2011-12-02 NOTE — OR Nursing (Signed)
Needle electrodes removed at end of procedure by [employer]

## 2011-12-02 NOTE — Anesthesia Preprocedure Evaluation (Addendum)
Anesthesia Evaluation  Patient identified by MRN, date of birth, ID band Patient awake    Airway       Dental   Pulmonary sleep apnea , pneumonia -, Current Smoker,          Cardiovascular     Neuro/Psych    GI/Hepatic GERD-  ,  Endo/Other  diabetes, Type 2, Oral Hypoglycemic Agents  Renal/GU      Musculoskeletal   Abdominal   Peds  Hematology   Anesthesia Other Findings   Reproductive/Obstetrics                          Anesthesia Physical Anesthesia Plan  ASA: III  Anesthesia Plan: General   Post-op Pain Management:    Induction: Intravenous  Airway Management Planned: Oral ETT  Additional Equipment:   Intra-op Plan:   Post-operative Plan: Extubation in OR  Informed Consent:   Plan Discussed with: CRNA, Anesthesiologist and Surgeon  Anesthesia Plan Comments:         Anesthesia Quick Evaluation

## 2011-12-02 NOTE — Op Note (Signed)
12/02/2011  10:18 AM  PATIENT:  [name]  71 y.o. 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 Anterolateral retroperitoneal interbody fusion L3-4 utilizing a peek interbody cage packed with morcellized allograft, 2. Percutaneous nonsegmental fixation utilizing pedicle screws  SURGEON:  Marikay Alar, MD  ASSISTANTS: Dr. Jule Ser  ANESTHESIA:   General  EBL: 50 ml  Total I/O In: 1500 [I.V.:1500] Out: 385 [Urine:335; Blood:50]  BLOOD ADMINISTERED:none  DRAINS: None   SPECIMEN:  No Specimen  INDICATION FOR PROCEDURE: This patient presented with a long history of severe back pain with some radiation to right quadricep. He tried medical management without relief. MRI and CT scan showed degenerative disease and spondylosis L3-4 retrolisthesis L3 on L4. Recommended an XLIF L3-4. Patient understood the risks, benefits, and alternatives and potential outcomes and wished to proceed.  PROCEDURE DETAILS: The patient was brought to the operating room and after induction of adequate generalized endotracheal anesthesia, the patient was positioned in the right lateral decubitus position exposing the left side. The patient was positioned in the typical XLIF fashion and taped into position and trajectories were confirmed with AP lateral fluoroscopy. The skin was cleaned and then prepped with DuraPrep and draped in the usual sterile fashion. 5 cc of local anesthesia was injected. A lateral incision was made over the appropriate L3-4 disc space and a incision was made posterior lateral to this. Blunt finger dissection was used to enter the retroperitoneal space. I could palpate the psoas musculature as well as the anterior face of the transverse process, and I could feel the fatty tissues of the retroperitoneum. I swept my finger up to the lateral incision  and passed my first dilator down to psoas musculature utilizing my index finger. We then used EMG monitoring to monitor our dilator. We checked our position with fluoroscopy and then placed a K wire into the disc space, and then used sequential dilators until our final retractor was in place. We then positioned it utilizing AP lateral fluoroscopy, and used our ball probe to make sure there were no neural structures within the working channel. I then placed the intradiscal shim, and opened my retractor further. The annulus was then incised and the discectomy was done with pituitaries. The annulus was removed from the endplates with a Cobb and the opposite annulus was opened and released. I then used a series of scrapers and shavers to prepare the endplates, taking care not to violate the endplates. I then placed my 16 mm paddle across the disc space to further open opposite annulus and I checked the trajectory with AP and lateral fluoroscopy. I then used sequential trials to determine the correct size cage. The 10 mm lordotic trial fit the best, so a corresponding cage was selected and packed with morcellized allograft. Using sliders it was then tapped gently into the disc space utilizing AP fluoroscopy until it was appropriately positioned. I then irrigated with saline solution containing bacitracin and dried any bleeding points. I checked my final construct with AP lateral fluoroscopy, removed my retractor, and closed the incisions with layers of 0 Vicryl in the fascia, 2-0 Vicryl in the subcutaneous tissues, and 3-0 Vicryl in the subcuticular tissues. The skin was closed with Dermabond.  AP and lateral fluoroscopy was used to determine landmarks for our percutaneous pedicle screws. Stab incisions were made over the pedicles and a Jamshidi needle was  passed down to identify the transverse process and the pedicle screw entry zone. Utilizing AP fluoroscopy, we started the needle at the lateral border of the pedicle  and tapped it into the pedicle into it reached the medial border of the pedicle on the AP image. EMG monitoring was used. We then checked our lateral fluoroscopy to assure our needle was in good position, and then passed our K wire into the vertebral body. We did this for each pedicle in the same way. We were then able to tap over each K wire, measured the correct length of our screws, and then place our pedicle screws over the K wires until they were in correct position. We then passed our rod through the towers and locked into position with locking caps and antitorque device. We then checked our final construct with AP lateral fluoroscopy. Then irrigated with saline solution containing bacitracin and dried all bleeding points. The wounds were then closed in layers of 0 Vicryl in the fascia, 2-0 Vicryl in the subcutaneous tissues, and 3-0 Vicryl in the subcuticular tissues. The skin was closed with Dermabond and the patient was awakened from general anesthesia and transferred to the recovery in stable condition. At the end of the procedure all sponge, needle and instrument counts were correct.   PLAN OF CARE: Admit to inpatient   PATIENT DISPOSITION:  PACU - hemodynamically stable.   Delay start of Pharmacological VTE agent (>24hrs) due to surgical blood loss or risk of bleeding:  yes

## 2011-12-02 NOTE — Transfer of Care (Signed)
Immediate Anesthesia Transfer of Care Note  Patient: [name]  Procedure(s) Performed: Procedure(s) (LRB) with comments: ANTERIOR LATERAL LUMBAR FUSION 1 LEVEL (Left) - left lumbar three-four LUMBAR PERCUTANEOUS PEDICLE SCREW 1 LEVEL (Left) - left lumbar three-four  Patient Location: PACU  Anesthesia Type: General  Level of Consciousness: awake, alert , oriented and patient cooperative  Airway & Oxygen Therapy: Patient Spontanous Breathing and Patient connected to face mask oxygen  Post-op Assessment: Report given to PACU RN, Post -op Vital signs reviewed and stable and Patient moving all extremities  Post vital signs: Reviewed and stable  Complications: No apparent anesthesia complications

## 2011-12-02 NOTE — H&P (Signed)
Subjective: Patient is a 71 y.o. male admitted for XLIF L3-4. Onset of symptoms was many months ago, progressively worse since that time.  The pain is rated severe, and is located at the low back and radiates to L quad occasionally. The pain is described as aching and occurs with activity. The symptoms have been progressive. Symptoms are exacerbated by activity, better with rest. MRI or CT showed DDD L3-4 and L5-S1.  Past Medical History  Diagnosis Date  . Hyperlipidemia     takes Lipitor and Fish Oil daily  . Sleep apnea     uses CPAP  . Pneumonia     after surgery in 2012  . Arthritis   . Joint pain   . Joint swelling   . Chronic back pain     DDD  . Dysphagia     EGD with dilitation  . GERD (gastroesophageal reflux disease)     takes Omeprazole daily  . Multiple duodenal ulcers   . Enlarged prostate     sees a urologist  . Diabetes mellitus     takes Actos-met  . Insomnia     d/t pain    Past Surgical History  Procedure Date  . Rotator cuff repair 62yrs ago    bilateral  . Cervical fusion 2012  . Joint replacement 2011    right knee replacement  . Cholecystectomy 12+yrs ago  . Finger surgery     right pointer  . Elbow surgery     right   . Colonoscopy   . Esophagogastroduodenoscopy     Prior to Admission medications   Medication Sig Start Date End Date Taking? Authorizing Provider  aspirin 81 MG chewable tablet Chew 81 mg by mouth daily.   Yes Historical Provider, MD  atorvastatin (LIPITOR) 80 MG tablet Take 40-80 mg by mouth daily. 80 mg on Monday, Wednesday and Friday , and 40 mg all other days.   Yes Historical Provider, MD  CYANOCOBALAMIN PO Take 1 tablet by mouth daily.   Yes Historical Provider, MD  finasteride (PROSCAR) 5 MG tablet Take 5 mg by mouth daily.   Yes Historical Provider, MD  fish oil-omega-3 fatty acids 1000 MG capsule Take 2 g by mouth daily.   Yes Historical Provider, MD  gabapentin (NEURONTIN) 300 MG capsule Take 300 mg by mouth 3 (three)  times daily.   Yes Historical Provider, MD  methocarbamol (ROBAXIN) 500 MG tablet Take 500 mg by mouth 4 (four) times daily.   Yes Historical Provider, MD  Methylcellulose, Laxative, (CITRUCEL PO) Take 2 capsules by mouth daily.   Yes Historical Provider, MD  Multiple Vitamin (MULTIVITAMIN WITH MINERALS) TABS Take 1 tablet by mouth daily.   Yes Historical Provider, MD  omeprazole (PRILOSEC) 20 MG capsule Take 20 mg by mouth daily.   Yes Historical Provider, MD  oxyCODONE (OXY IR/ROXICODONE) 5 MG immediate release tablet Take 5 mg by mouth every 4 (four) hours as needed.   Yes Historical Provider, MD  pioglitazone-metformin (ACTOPLUS MET) 15-500 MG per tablet Take 1 tablet by mouth 2 (two) times daily with a meal.   Yes Historical Provider, MD  Saw Palmetto, Serenoa repens, (SAW PALMETTO PO) Take 1 capsule by mouth daily.   Yes Historical Provider, MD  VITAMIN D, ERGOCALCIFEROL, PO Take 1 tablet by mouth daily.   Yes Historical Provider, MD   No Known Allergies  History  Substance Use Topics  . Smoking status: Former Smoker -- 1.5 packs/day for 30 years    Types:  Cigarettes  . Smokeless tobacco: Never Used   Comment: quit 25+yrs ago  . Alcohol Use: No    History reviewed. No pertinent family history.   Review of Systems  Positive ROS: neg  All other systems have been reviewed and were otherwise negative with the exception of those mentioned in the HPI and as above.  Objective: Vital signs in last 24 hours:    General Appearance: Alert, cooperative, no distress, appears stated age Head: Normocephalic, without obvious abnormality, atraumatic Eyes: PERRL, conjunctiva/corneas clear, EOM's intact, fundi benign, both eyes      Ears: Normal TM's and external ear canals, both ears Throat: Lips, mucosa, and tongue normal; teeth and gums normal Neck: Supple, symmetrical, trachea midline, no adenopathy; thyroid: No enlargement/tenderness/nodules; no carotid bruit or JVD Back: Symmetric, no  curvature, ROM normal, no CVA tenderness Lungs: Clear to auscultation bilaterally, respirations unlabored Heart: Regular rate and rhythm, S1 and S2 normal, no murmur, rub or gallop Abdomen: Soft, non-tender, bowel sounds active all four quadrants, no masses, no organomegaly Extremities: Extremities normal, atraumatic, no cyanosis or edema Pulses: 2+ and symmetric all extremities Skin: Skin color, texture, turgor normal, no rashes or lesions  NEUROLOGIC:   Mental status: Alert and oriented x4,  no aphasia, good attention span, fund of knowledge, and memory Motor Exam - grossly normal Sensory Exam - grossly normal Reflexes: 1+ Coordination - grossly normal Gait - grossly normal Balance - grossly normal Cranial Nerves: I: smell Not tested  II: visual acuity  OS: nl    OD: nl  II: visual fields Full to confrontation  II: pupils Equal, round, reactive to light  III,VII: ptosis None  III,IV,VI: extraocular muscles  Full ROM  V: mastication Normal  V: facial light touch sensation  Normal  V,VII: corneal reflex  Present  VII: facial muscle function - upper  Normal  VII: facial muscle function - lower Normal  VIII: hearing Not tested  IX: soft palate elevation  Normal  IX,X: gag reflex Present  XI: trapezius strength  5/5  XI: sternocleidomastoid strength 5/5  XI: neck flexion strength  5/5  XII: tongue strength  Normal    Data Review Lab Results  Component Value Date   WBC 7.5 11/26/2011   HGB 14.0 11/26/2011   HCT 40.6 11/26/2011   MCV 99.0 11/26/2011   PLT 224 11/26/2011   Lab Results  Component Value Date   NA 139 11/26/2011   K 4.2 11/26/2011   CL 104 11/26/2011   CO2 25 11/26/2011   BUN 20 11/26/2011   CREATININE 0.78 11/26/2011   GLUCOSE 124* 11/26/2011   Lab Results  Component Value Date   INR 0.98 11/26/2011    Assessment/Plan: Patient admitted for XLIF L3-4.  Patient has failed conservative therapy.  I explained the condition and procedure to the  patient and answered any questions.  Patient wishes to proceed with procedure as planned. Understands risks/ benefits and typical outcomes of procedure. Understands that L5-S1 may also be a pain generator and may need this to be addressed at a later date if necessary.   Dayshia Ballinas S 12/02/2011 6:41 AM

## 2011-12-02 NOTE — Preoperative (Signed)
Beta Blockers   Reason not to administer Beta Blockers:Not Applicable 

## 2011-12-02 NOTE — Progress Notes (Signed)
UR COMPLETED  

## 2011-12-02 NOTE — Anesthesia Procedure Notes (Signed)
Procedure Name: Intubation Date/Time: 12/02/2011 7:50 AM Performed by: Jerilee Hoh Pre-anesthesia Checklist: Patient identified, Emergency Drugs available, Suction available and Patient being monitored Patient Re-evaluated:Patient Re-evaluated prior to inductionOxygen Delivery Method: Circle system utilized Preoxygenation: Pre-oxygenation with 100% oxygen Intubation Type: IV induction Ventilation: Mask ventilation without difficulty Laryngoscope Size: Mac and 4 Grade View: Grade III Tube type: Oral Tube size: 7.5 mm Number of attempts: 1 Airway Equipment and Method: Bougie stylet,  Stylet and LTA kit utilized Placement Confirmation: positive ETCO2,  ETT inserted through vocal cords under direct vision and breath sounds checked- equal and bilateral Secured at: 22 cm Tube secured with: Tape Dental Injury: Teeth and Oropharynx as per pre-operative assessment  Difficulty Due To: Difficulty was anticipated, Difficult Airway- due to anterior larynx and Difficult Airway- due to reduced neck mobility Comments: Easy mask airway.  DL x 1.  Unable to view cords, view of epiglottis only.  Bougie stylet passed easily and tracheal ridges clearly felt.  ETT passed easily over bougie, +BBS, +EtCO2.  Atraumatic intubation.

## 2011-12-02 NOTE — Progress Notes (Signed)
Orthopedic Tech Progress Note Patient Details:  [name] May 21, 1940 [id number]  Patient ID: [name], male   DOB: 1940/07/20, 71 y.o.   MRN: [id number]   [name] 12/02/2011, 8:05 AM L-S BRACE WITH ANT. POST. PANELS COMPLETED BY BIO-TECH

## 2011-12-03 LAB — TYPE AND SCREEN
ABO/RH(D): A POS
Antibody Screen: POSITIVE

## 2011-12-03 LAB — GLUCOSE, CAPILLARY
Glucose-Capillary: 215 mg/dL — ABNORMAL HIGH (ref 70–99)
Glucose-Capillary: 155 mg/dL — ABNORMAL HIGH (ref 70–99)

## 2011-12-03 MED ORDER — OXYCODONE HCL 10 MG PO TABS: 10.0000 mg | ORAL_TABLET | Freq: Four times a day (QID) | ORAL | Status: DC | PRN

## 2011-12-03 NOTE — Progress Notes (Signed)
Pt dc instructions provided. Pt verbalized understanding. Iv dc intact. Pt under no s/s distress. rx given to patient by MD.

## 2011-12-03 NOTE — Progress Notes (Signed)
   CARE MANAGEMENT NOTE 12/03/2011  Patient:  AADHAV, UHLIG   Account Number:  [id number]  Date Initiated:  12/03/2011  Documentation initiated by:  [facility]  Subjective/Objective Assessment:   Anterolateral retroperitoneal interbody fusion L3-4     Action/Plan:   Anticipated DC Date:  12/04/2011   Anticipated DC Plan:  HOME/SELF CARE      DC Planning Services  CM consult      Choice offered to / List presented to:             Status of service:  Completed, signed off Medicare Important Message given?   (If response is "NO", the following Medicare IM given date fields will be blank) Date Medicare IM given:   Date Additional Medicare IM given:    Discharge Disposition:  HOME/SELF CARE  Per UR Regulation:    If discussed at Long Length of Stay Meetings, dates discussed:    Comments:  12/03/2011 0930 NCM spoke to pt and states he has RW and 3n1 at home. Pt states he worked with PT and HH PT was not recommended. Pt states he is doing well and possible d/c today. Having minimal pain at this time. No additional CM needs identified. Isidoro Donning RN CCM Case Mgmt phone [phone]

## 2011-12-03 NOTE — Evaluation (Signed)
Occupational Therapy Evaluation and Discharge Patient Details Name: [name] MRN: [id number] DOB: 1940/06/05 Today's Date: 12/03/2011 Time: 4098-1191 OT Time Calculation (min): 28 min  OT Assessment / Plan / Recommendation Clinical Impression  Pt admitted for lumbar back surgery and is now S to mod I with all adls.  Pt understand back precautions and techniques to follow for proper body mechanics.  Pt is not in need of further OT at this time.    OT Assessment  Patient does not need any further OT services    Follow Up Recommendations  No OT follow up    Barriers to Discharge      Equipment Recommendations  None recommended by OT    Recommendations for Other Services    Frequency       Precautions / Restrictions Precautions Precautions: Back Precaution Booklet Issued: Yes (comment) Precaution Comments: pt educated on 3/3 back precautions Required Braces or Orthoses: Spinal Brace Spinal Brace: Lumbar corset;Applied in sitting position Restrictions Weight Bearing Restrictions: No   Pertinent Vitals/Pain Pt with 4/10 pain.  Pt given meds by nurse during OT session.    ADL  Eating/Feeding: Performed;Independent Where Assessed - Eating/Feeding: Chair Grooming: Performed;Wash/dry hands;Teeth care;Supervision/safety Where Assessed - Grooming: Unsupported standing Upper Body Bathing: Simulated;Set up Where Assessed - Upper Body Bathing: Unsupported sitting Lower Body Bathing: Simulated;Set up Where Assessed - Lower Body Bathing: Unsupported sit to stand Upper Body Dressing: Performed;Set up Where Assessed - Upper Body Dressing: Unsupported sitting Lower Body Dressing: Performed;Set up Where Assessed - Lower Body Dressing: Unsupported sit to stand Toilet Transfer: Performed;Supervision/safety Toilet Transfer Method: Sit to Barista: Comfort height toilet;Grab bars Toileting - Clothing Manipulation and Hygiene:  Performed;Supervision/safety Where Assessed - Engineer, mining and Hygiene: Standing Equipment Used: Back brace;Rolling walker Transfers/Ambulation Related to ADLs: Pt S with all transfers.  S provided due to first time seeing pt and pt w IV. ADL Comments: Pt requires cues for back precautions and techniques to keep proper body mechanics during adls.    OT Diagnosis:    OT Problem List:   OT Treatment Interventions:     OT Goals    Visit Information  Last OT Received On: 12/03/11 Assistance Needed: +1    Subjective Data  Subjective: "I want to go home today." Patient Stated Goal: to go home.   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    Extremity/Trunk Assessment Right Upper Extremity Assessment RUE ROM/Strength/Tone: Within functional levels RUE Sensation: WFL - Light Touch RUE Coordination: WFL - gross/fine  motor Left Upper Extremity Assessment LUE ROM/Strength/Tone: Within functional levels LUE Sensation: WFL - Light Touch LUE Coordination: WFL - gross/fine motor  Right Lower Extremity Assessment RLE ROM/Strength/Tone: Within functional levels RLE Sensation: WFL - Light Touch Left Lower Extremity Assessment LLE ROM/Strength/Tone: Within functional levels LLE Sensation: WFL - Light Touch Trunk Assessment Trunk Assessment: Normal     Mobility Bed Mobility Bed Mobility: Rolling Left;Left Sidelying to Sit;Sitting - Scoot to Edge of Bed Rolling Left: 5: Supervision;With rail Left Sidelying to Sit: 4: Min guard;With rails;HOB elevated Sitting - Scoot to Edge of Bed: 5: Supervision Details for Bed Mobility Assistance: VC for proper sequencing to maintain back precautions during transfer. minguard only for increased support. Transfers Transfers: Sit to Stand;Stand to Sit Sit to Stand: 5: Supervision;From chair/3-in-1 Stand to Sit: 5: Supervision;To chair/3-in-1;With armrests Details for Transfer Assistance: Pt did well reaching back for chair w/o cues before sitting.     Shoulder Instructions     Exercise     Balance Balance Balance Assessed: No   End of Session OT - End of Session Equipment Utilized During Treatment: Back brace Activity Tolerance: Patient tolerated treatment well Patient left: in chair;with call bell/phone within reach Nurse Communication: Mobility status  GO     [name] 12/03/2011, 9:45 AM [phone]

## 2011-12-03 NOTE — Discharge Summary (Signed)
Physician Discharge Summary  Patient ID: [name] MRN: [id number] DOB/AGE: Mar 22, 1940 71 y.o.  Admit date: 12/02/2011 Discharge date: 12/03/2011  Admission Diagnoses: Lumbar spondylosis and stenosis    Discharge Diagnoses: Same   Discharged Condition: good  Hospital Course: The patient was admitted on 12/02/2011 and taken to the operating room where the patient underwent an XLIF L3-4. The patient tolerated the procedure well and was taken to the recovery room and then to the floor in stable condition. The hospital course was routine. There were no complications. The wound remained clean dry and intact. Pt had appropriate back soreness. No complaints of leg pain or new N/T/W. The patient remained afebrile with stable vital signs, and tolerated a regular diet. The patient continued to increase activities, and pain was well controlled with oral pain medications.   Consults: None  Significant Diagnostic Studies:  Results for orders placed during the hospital encounter of 12/02/11  TYPE AND SCREEN      Component Value Range   ABO/RH(D) A POS     Antibody Screen POS     Sample Expiration 12/05/2011    GLUCOSE, CAPILLARY      Component Value Range   Glucose-Capillary 123 (*) 70 - 99 mg/dL  GLUCOSE, CAPILLARY      Component Value Range   Glucose-Capillary 147 (*) 70 - 99 mg/dL  GLUCOSE, CAPILLARY      Component Value Range   Glucose-Capillary 180 (*) 70 - 99 mg/dL  GLUCOSE, CAPILLARY      Component Value Range   Glucose-Capillary 215 (*) 70 - 99 mg/dL    Chest 2 View  40/98/1191  *RADIOLOGY REPORT*  Clinical Data: Sleep apnea.  Ex-smoker.  CHEST - 2 VIEW  Comparison: 10/31/2010  Findings: The cardiac silhouette is normal in size and configuration and the mediastinum normal in contour caliber.  The lungs are clear.  No pleural effusion or pneumothorax.  The bony thorax is intact.  There are changes from a previous anterior cervical spine fusion, stable.  IMPRESSION: No  active disease of the chest.  No significant change from the prior study.   Original Report Authenticated By: Domenic Moras, M.D.    Dg Lumbar Spine 2-3 Views  12/02/2011  *RADIOLOGY REPORT*  Clinical Data: Anterolateral retroperitoneal interbody fusion at L3- L4.  LUMBAR SPINE - 2-3 VIEW  Comparison: MRI 07/25/2011  Findings: Two fluoroscopic images demonstrate pedicle screw and rod fixation along the left side of L3-L4.  There is an interbody cage at L3-L4.  Disc space narrowing at L5-S1.  IMPRESSION: L3-L4 fusion as described.  No evidence for hardware complication.   Original Report Authenticated By: Richarda Overlie, M.D.     Antibiotics:  Anti-infectives     Start     Dose/Rate Route Frequency Ordered Stop   12/02/11 1630   ceFAZolin (ANCEF) IVPB 1 g/50 mL premix        1 g 100 mL/hr over 30 Minutes Intravenous Every 8 hours 12/02/11 1241 12/02/11 2325   12/02/11 0751   bacitracin 50,000 Units in sodium chloride irrigation 0.9 % 500 mL irrigation  Status:  Discontinued          As needed 12/02/11 0851 12/02/11 1013   12/02/11 0714   bacitracin 47829 UNITS injection     Comments: RATCLIFF, ESTHER: cabinet override         12/02/11 0714 12/02/11 1929   12/02/11 0000   ceFAZolin (ANCEF) IVPB 2 g/50 mL premix  2 g 100 mL/hr over 30 Minutes Intravenous 60 min pre-op 12/01/11 1435 12/02/11 0821          Discharge Exam: Blood pressure 120/64, pulse 67, temperature 97.8 F (36.6 C), temperature source Oral, resp. rate 18, height 5\' 7"  (1.702 m), weight 93.441 kg (206 lb), SpO2 97.00%. Neurologic: Grossly normal Incisions clean dry and intact  Discharge Medications:     Medication List     As of 12/03/2011 11:04 AM    TAKE these medications         aspirin 81 MG chewable tablet   Chew 81 mg by mouth daily.      atorvastatin 80 MG tablet   Commonly known as: LIPITOR   Take 40-80 mg by mouth daily. 80 mg on Monday, Wednesday and Friday , and 40 mg all other days.       CITRUCEL PO   Take 2 capsules by mouth daily.      CYANOCOBALAMIN PO   Take 1 tablet by mouth daily.      finasteride 5 MG tablet   Commonly known as: PROSCAR   Take 5 mg by mouth daily.      fish oil-omega-3 fatty acids 1000 MG capsule   Take 2 g by mouth daily.      gabapentin 300 MG capsule   Commonly known as: NEURONTIN   Take 300 mg by mouth 3 (three) times daily.      methocarbamol 500 MG tablet   Commonly known as: ROBAXIN   Take 500 mg by mouth 4 (four) times daily.      multivitamin with minerals Tabs   Take 1 tablet by mouth daily.      omeprazole 20 MG capsule   Commonly known as: PRILOSEC   Take 20 mg by mouth daily.      Oxycodone HCl 10 MG Tabs   Take 1-2 tablets (10-20 mg total) by mouth every 6 (six) hours as needed for pain.      pioglitazone-metformin 15-500 MG per tablet   Commonly known as: ACTOPLUS MET   Take 1 tablet by mouth 2 (two) times daily with a meal.      SAW PALMETTO PO   Take 1 capsule by mouth daily.      VITAMIN D (ERGOCALCIFEROL) PO   Take 1 tablet by mouth daily.        Disposition: Home  Final Dx: XLIF L3-4      Discharge Orders    Future Orders Please Complete By Expires   Diet - low sodium heart healthy      Increase activity slowly      Discharge instructions      Comments:   No bending twisting or heavy lifting, may shower normally   No wound care      Driving Restrictions      Comments:   Two weeks   Lifting restrictions      Comments:   Less than 10 lbs   Call MD for:  temperature >100.4      Call MD for:  persistant nausea and vomiting      Call MD for:  severe uncontrolled pain      Call MD for:  redness, tenderness, or signs of infection (pain, swelling, redness, odor or green/yellow discharge around incision site)      Call MD for:  difficulty breathing, headache or visual disturbances         Follow-up Information    Follow up with Sunya Humbarger  S, MD. Schedule an appointment as soon as possible for  a visit in 2 weeks.   Contact information:   [address] [place] [address] [phone]           Signed: Tia Alert 12/03/2011, 11:04 AM

## 2011-12-03 NOTE — Evaluation (Signed)
Physical Therapy Evaluation Patient Details Name: [name] MRN: [id number] DOB: [birth year] Today's Date: 12/03/2011 Time: 1478-2956 PT Time Calculation (min): 25 min  PT Assessment / Plan / Recommendation Clinical Impression  Pt s/p ALF L3-5. Pt with good functional mobility, slightly limited secondary to pain. Pt will benefit from skilled PT in the acute care setting in order to maximize functional mobility and safety prior to d/c home with wife    PT Assessment  Patient needs continued PT services    Follow Up Recommendations  No PT follow up;Supervision - Intermittent    Does the patient have the potential to tolerate intense rehabilitation      Barriers to Discharge        Equipment Recommendations  None recommended by PT    Recommendations for Other Services     Frequency Min 5X/week    Precautions / Restrictions Precautions Precautions: Back Precaution Booklet Issued: Yes (comment) Precaution Comments: pt educated on 3/3 back precautions Required Braces or Orthoses: Spinal Brace Spinal Brace: Lumbar corset;Applied in sitting position Restrictions Weight Bearing Restrictions: No   Pertinent Vitals/Pain Pt reports pain 5/10 after mobility.       Mobility  Bed Mobility Bed Mobility: Rolling Left;Left Sidelying to Sit;Sitting - Scoot to Edge of Bed Rolling Left: 5: Supervision;With rail Left Sidelying to Sit: 4: Min guard;With rails;HOB elevated Sitting - Scoot to Edge of Bed: 5: Supervision Details for Bed Mobility Assistance: VC for proper sequencing to maintain back precautions during transfer. minguard only for increased support. Transfers Transfers: Sit to Stand;Stand to Sit Sit to Stand: 4: Min guard;With upper extremity assist;From bed Stand to Sit: 4: Min guard;With upper extremity assist;To chair/3-in-1 Details for Transfer Assistance: VC for proper sequencing to avoid bending while standing as well as hand placement while standing. Pt able to  control descent into chair Ambulation/Gait Ambulation/Gait Assistance: 4: Min guard Ambulation Distance (Feet): 200 Feet Assistive device: Other (Comment) (IV Pole) Ambulation/Gait Assistance Details: Minguard assist for safety. Pt with no loss of balance or difficulty with ambulating. VC for proper sequencing and safety Gait Pattern: Step-through pattern;Decreased stride length Gait velocity: decreased gait speed    Shoulder Instructions     Exercises     PT Diagnosis: Difficulty walking;Acute pain  PT Problem List: Decreased activity tolerance;Decreased mobility;Decreased knowledge of use of DME;Decreased safety awareness;Decreased knowledge of precautions;Pain PT Treatment Interventions: DME instruction;Gait training;Stair training;Functional mobility training;Therapeutic activities;Patient/family education   PT Goals Acute Rehab PT Goals PT Goal Formulation: With patient Time For Goal Achievement: 12/10/11 Potential to Achieve Goals: Good Pt will go Supine/Side to Sit: with modified independence PT Goal: Supine/Side to Sit - Progress: Goal set today Pt will go Sit to Supine/Side: with modified independence PT Goal: Sit to Supine/Side - Progress: Goal set today Pt will go Sit to Stand: with modified independence PT Goal: Sit to Stand - Progress: Goal set today Pt will go Stand to Sit: with modified independence PT Goal: Stand to Sit - Progress: Goal set today Pt will Transfer Bed to Chair/Chair to Bed: with modified independence PT Transfer Goal: Bed to Chair/Chair to Bed - Progress: Goal set today Pt will Ambulate: >150 feet;with modified independence;with least restrictive assistive device PT Goal: Ambulate - Progress: Goal set today Pt will Go Up / Down Stairs: 1-2 stairs;with modified independence PT Goal: Up/Down Stairs - Progress: Goal set today Additional Goals Additional Goal #1: Pt will be able to verbalize and maintain 3/3 back precautions during all functional  mobility PT  Goal: Additional Goal #1 - Progress: Goal set today  Visit Information  Last PT Received On: 12/03/11 Assistance Needed: +1    Subjective Data  Patient Stated Goal: to be able to travel   Prior Functioning  Home Living Lives With: Spouse Available Help at Discharge: Family;Available 24 hours/day Type of Home: House Home Access: Stairs to enter [employer] of Steps: 1 Entrance Stairs-Rails: None Home Layout: One level Bathroom Shower/Tub: Walk-in shower;Door [employer] Toilet: Handicapped height Bathroom Accessibility: Yes How Accessible: Accessible via walker Home Adaptive Equipment: Built-in shower seat;Shower chair with back;Walker - rolling;Straight cane Prior Function Level of Independence: Independent Able to Take Stairs?: Yes Driving: Yes 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 Communication: No difficulties Dominant Hand: Right    Cognition  Overall Cognitive Status: Appears within functional limits for tasks assessed/performed Arousal/Alertness: Awake/alert Orientation Level: Appears intact for tasks assessed Behavior During Session: [facility] for tasks performed    Extremity/Trunk Assessment Right Lower Extremity Assessment RLE ROM/Strength/Tone: Within functional levels RLE Sensation: WFL - Light Touch Left Lower Extremity Assessment LLE ROM/Strength/Tone: Within functional levels LLE Sensation: WFL - Light Touch   Balance    End of Session PT - End of Session Equipment Utilized During Treatment: Back brace Activity Tolerance: Patient tolerated treatment well Patient left: in chair;with call bell/phone within reach Nurse Communication: Mobility status    Milana Kidney 12/03/2011, 8:54 AM  12/03/2011 Milana Kidney DPT PAGER: [phone] OFFICE: [phone]

## 2011-12-05 LAB — TYPE AND SCREEN
ABO/RH(D): A POS
Antibody Screen: POSITIVE
DAT, IgG: NEGATIVE
Unit division: 0
Unit division: 0

## 2011-12-06 ENCOUNTER — Encounter (HOSPITAL_COMMUNITY): Payer: Self-pay | Admitting: Neurological Surgery

## 2011-12-11 ENCOUNTER — Encounter (HOSPITAL_COMMUNITY): Payer: Self-pay | Admitting: *Deleted

## 2011-12-11 ENCOUNTER — Emergency Department (HOSPITAL_COMMUNITY)
Admission: EM | Admit: 2011-12-11 | Discharge: 2011-12-11 | Disposition: A | Discharge status: Home / Self Care | Payer: Medicare Other | Attending: Emergency Medicine | Admitting: Emergency Medicine

## 2011-12-11 ENCOUNTER — Emergency Department (HOSPITAL_COMMUNITY): Payer: Medicare Other

## 2011-12-11 DIAGNOSIS — K219 Gastro-esophageal reflux disease without esophagitis: Secondary | ICD-10-CM | POA: Insufficient documentation

## 2011-12-11 DIAGNOSIS — M51379 Other intervertebral disc degeneration, lumbosacral region without mention of lumbar back pain or lower extremity pain: Secondary | ICD-10-CM | POA: Insufficient documentation

## 2011-12-11 DIAGNOSIS — M5137 Other intervertebral disc degeneration, lumbosacral region: Secondary | ICD-10-CM | POA: Insufficient documentation

## 2011-12-11 DIAGNOSIS — M545 Low back pain, unspecified: Secondary | ICD-10-CM | POA: Insufficient documentation

## 2011-12-11 DIAGNOSIS — M129 Arthropathy, unspecified: Secondary | ICD-10-CM | POA: Insufficient documentation

## 2011-12-11 DIAGNOSIS — Z7982 Long term (current) use of aspirin: Secondary | ICD-10-CM | POA: Insufficient documentation

## 2011-12-11 DIAGNOSIS — E119 Type 2 diabetes mellitus without complications: Secondary | ICD-10-CM | POA: Insufficient documentation

## 2011-12-11 DIAGNOSIS — Z87891 Personal history of nicotine dependence: Secondary | ICD-10-CM | POA: Insufficient documentation

## 2011-12-11 DIAGNOSIS — G4730 Sleep apnea, unspecified: Secondary | ICD-10-CM | POA: Insufficient documentation

## 2011-12-11 DIAGNOSIS — Z79899 Other long term (current) drug therapy: Secondary | ICD-10-CM | POA: Insufficient documentation

## 2011-12-11 DIAGNOSIS — N400 Enlarged prostate without lower urinary tract symptoms: Secondary | ICD-10-CM | POA: Insufficient documentation

## 2011-12-11 DIAGNOSIS — E785 Hyperlipidemia, unspecified: Secondary | ICD-10-CM | POA: Insufficient documentation

## 2011-12-11 DIAGNOSIS — Z8701 Personal history of pneumonia (recurrent): Secondary | ICD-10-CM | POA: Insufficient documentation

## 2011-12-11 DIAGNOSIS — G4700 Insomnia, unspecified: Secondary | ICD-10-CM | POA: Insufficient documentation

## 2011-12-11 DIAGNOSIS — Z8719 Personal history of other diseases of the digestive system: Secondary | ICD-10-CM | POA: Insufficient documentation

## 2011-12-11 DIAGNOSIS — M549 Dorsalgia, unspecified: Secondary | ICD-10-CM

## 2011-12-11 MED ORDER — HYDROMORPHONE HCL PF 1 MG/ML IJ SOLN
0.5000 mg | Freq: Once | INTRAMUSCULAR | Status: AC
  Administered 2011-12-11: 0.5 mg via INTRAVENOUS
  Filled 2011-12-11: qty 1

## 2011-12-11 MED ORDER — SODIUM CHLORIDE 0.9 % IV SOLN
Freq: Once | INTRAVENOUS | Status: AC
  Administered 2011-12-11: 20 mL/h via INTRAVENOUS

## 2011-12-11 MED ORDER — HYDROMORPHONE HCL 2 MG PO TABS: 2.0000 mg | ORAL_TABLET | ORAL | Status: DC | PRN

## 2011-12-11 MED ORDER — HYDROMORPHONE HCL PF 1 MG/ML IJ SOLN
1.0000 mg | Freq: Once | INTRAMUSCULAR | Status: AC
  Administered 2011-12-11: 1 mg via INTRAVENOUS
  Filled 2011-12-11: qty 1

## 2011-12-11 NOTE — ED Provider Notes (Signed)
History     CSN: [id number]  Arrival date & time 12/11/11  1037   First MD Initiated Contact with Patient 12/11/11 1135      Chief Complaint  Patient presents with  . Post surgery BLE pain/weak     (Consider location/radiation/quality/duration/timing/severity/associated sxs/prior treatment) Patient is a 71 y.o. male presenting with back pain. The history is provided by the patient.  Back Pain  This is a new problem. The current episode started 2 days ago. The problem occurs constantly. The problem has been gradually worsening. The pain is associated with no known injury. Pertinent negatives include no chest pain, no fever, no numbness and no abdominal pain. 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 Yetta Barre. He was comfortable when he went home but started having significant pain in the right leg, mild back pain 2 days ago without strain or injury. He was concerned that his pain medication no longer worked and presented for evaluation..    Past Medical History  Diagnosis Date  . Hyperlipidemia     takes Lipitor and Fish Oil daily  . Sleep apnea     uses CPAP  . Pneumonia     after surgery in 2012  . Arthritis   . Joint pain   . Joint swelling   . Chronic back pain     DDD  . Dysphagia     EGD with dilitation  . GERD (gastroesophageal reflux disease)     takes Omeprazole daily  . Multiple duodenal ulcers   . Enlarged prostate     sees a urologist  . Diabetes mellitus     takes Actos-met  . Insomnia     d/t pain    Past Surgical History  Procedure Date  . Rotator cuff repair 80yrs ago    bilateral  . Cervical fusion 2012  . Joint replacement 2011    right knee replacement  . Cholecystectomy 12+yrs ago  . Finger surgery     right pointer  . Elbow surgery     right   . Colonoscopy   . Esophagogastroduodenoscopy   . Anterior lat lumbar fusion 12/02/2011    Procedure: ANTERIOR LATERAL LUMBAR  FUSION 1 LEVEL;  Surgeon: Tia Alert, MD;  Location: [facility];  Service: Neurosurgery;  Laterality: Left;  left lumbar three-four    No family history on file.  History  Substance Use Topics  . Smoking status: Former Smoker -- 1.5 packs/day for 30 years    Types: Cigarettes  . Smokeless tobacco: Never Used   Comment: quit 25+yrs ago  . Alcohol Use: No      Review of Systems  Constitutional: Negative for fever.  Respiratory: Negative for shortness of breath.   Cardiovascular: Negative for chest pain.  Gastrointestinal: Negative for vomiting and abdominal pain.  Genitourinary: Negative for difficulty urinating.  Musculoskeletal: Positive for back pain.  Neurological: Negative for numbness.    Allergies  Review of patient's allergies indicates no known allergies.  Home Medications   Current Outpatient Rx  Name Route Sig Dispense Refill  . ASPIRIN 81 MG PO CHEW Oral Chew 81 mg by mouth daily.    . ATORVASTATIN CALCIUM 80 MG PO TABS Oral Take 40-80 mg by mouth daily. 80 mg on Monday, Wednesday and Friday , and 40 mg all other days.    . CYANOCOBALAMIN PO Oral Take 1 tablet by mouth daily.    . FINASTERIDE 5 MG  PO TABS Oral Take 5 mg by mouth daily.    . OMEGA-3 FATTY ACIDS 1000 MG PO CAPS Oral Take 2 g by mouth daily.    [name] Kitchen GABAPENTIN 300 MG PO CAPS Oral Take 600 mg by mouth 3 (three) times daily.     [name] Kitchen METHOCARBAMOL 500 MG PO TABS Oral Take 500 mg by mouth 4 (four) times daily.    [name] Kitchen CITRUCEL PO Oral Take 2 capsules by mouth daily.    . ADULT MULTIVITAMIN W/MINERALS CH Oral Take 1 tablet by mouth daily.    [name] Kitchen OMEPRAZOLE 20 MG PO CPDR Oral Take 20 mg by mouth daily.    . OXYCODONE HCL 10 MG PO TABS Oral Take 1-2 tablets (10-20 mg total) by mouth every 6 (six) hours as needed for pain. 90 tablet 0  . PIOGLITAZONE HCL-METFORMIN HCL 15-500 MG PO TABS Oral Take 1 tablet by mouth 2 (two) times daily with a meal.    . SAW PALMETTO PO Oral Take 1 capsule by mouth daily.     [name] Kitchen VITAMIN D (ERGOCALCIFEROL) PO Oral Take 1 tablet by mouth daily.      BP 134/58  Pulse 71  Temp 97.6 F (36.4 C) (Oral)  Resp 16  SpO2 94%  Physical Exam  Constitutional: He is oriented to person, place, and time. He appears well-developed and well-nourished. No distress.  HENT:  Head: Normocephalic.  Neck: Normal range of motion. Neck supple.  Cardiovascular: Normal rate and regular rhythm.        Pulses in distal lower extremities palpable.   Pulmonary/Chest: Effort normal and breath sounds normal.  Abdominal: Soft. Bowel sounds are normal. There is no tenderness. There is no rebound and no guarding.  Musculoskeletal: Normal range of motion. He exhibits no edema.  Neurological: He is alert and oriented to person, place, and time.       Sensation normal and equal in lower extremities. Full and equal strength.   Skin: Skin is warm and dry. No rash noted.  Psychiatric: He has a normal mood and affect.    ED Course  Procedures (including critical care time)  Labs Reviewed - No data to display No results found. Chest 2 View  11/26/2011  *RADIOLOGY REPORT*  Clinical Data: Sleep apnea.  Ex-smoker.  CHEST - 2 VIEW  Comparison: 10/31/2010  Findings: The cardiac silhouette is normal in size and configuration and the mediastinum normal in contour caliber.  The lungs are clear.  No pleural effusion or pneumothorax.  The bony thorax is intact.  There are changes from a previous anterior cervical spine fusion, stable.  IMPRESSION: No active disease of the chest.  No significant change from the prior study.   Original Report Authenticated By: Domenic Moras, M.D.    Dg Lumbar Spine 2-3 Views  12/02/2011  *RADIOLOGY REPORT*  Clinical Data: Anterolateral retroperitoneal interbody fusion at L3- L4.  LUMBAR SPINE - 2-3 VIEW  Comparison: MRI 07/25/2011  Findings: Two fluoroscopic images demonstrate pedicle screw and rod fixation along the left side of L3-L4.  There is an interbody cage at  L3-L4.  Disc space narrowing at L5-S1.  IMPRESSION: L3-L4 fusion as described.  No evidence for hardware complication.   Original Report Authenticated By: Richarda Overlie, M.D.    Dg Lumbar Spine Complete  12/11/2011  *RADIOLOGY REPORT*  Clinical Data: Recent unilateral L3-4 fusion, back pain  LUMBAR SPINE - COMPLETE 4+ VIEW  Comparison: 12/02/2011, 07/25/2011  Findings: Left unilateral posterior fusion at L3-4.  Disc  spacer noted.  Normal alignment.  Minor endplate degenerative changes.  L5 S1 degenerative disc disease also noted.  Preserved vertebral body heights.  No hardware abnormality.  No pars defects.  Normal SI joints.  Retained stool throughout the colon.  Prior cholecystectomy noted.  IMPRESSION: L3-4 unilateral fusion  on the left side.  Stable alignment and hardware.   Original Report Authenticated By: Judie Petit. Shick, M.D.    Dg C-arm 61-120 Min  12/10/2011  *RADIOLOGY REPORT*  Clinical Data: Anterolateral retroperitoneal interbody fusion at L3- L4.  LUMBAR SPINE - 2-3 VIEW  Comparison: MRI 07/25/2011  Findings: Two fluoroscopic images demonstrate pedicle screw and rod fixation along the left side of L3-L4.  There is an interbody cage at L3-L4.  Disc space narrowing at L5-S1.  IMPRESSION: L3-L4 fusion as described.  No evidence for hardware complication.   Original Report Authenticated By: Richarda Overlie, M.D.     No diagnosis found.  1. Back pain 2. Post-surgical pain  MDM  Discussed with Dr. Jeral Fruit. Recommended pain control and follow up in office on Monday. Patient's pain is improved with medications. Eating without difficulty. Discussed plan and patient and family are comfortable.         Rodena Medin, PA-C 12/11/11 1510

## 2011-12-11 NOTE — ED Notes (Signed)
Bladder scan showed 22 ml of residual urine.

## 2011-12-11 NOTE — ED Notes (Signed)
Pt had lumbar fusion 10/24 and now with severe BLE pain and increased weakness in LE.  Pt denies any bowel or urine incontinence

## 2011-12-11 NOTE — ED Provider Notes (Signed)
Medical screening examination/treatment/procedure(s) were conducted as a shared visit with non-physician practitioner(s) and myself.  I personally evaluated the patient during the encounter  Loren Racer, MD 12/11/11 1537

## 2011-12-18 ENCOUNTER — Encounter (HOSPITAL_COMMUNITY): Payer: Self-pay | Admitting: *Deleted

## 2011-12-18 ENCOUNTER — Observation Stay (HOSPITAL_COMMUNITY)
Admission: EM | Admit: 2011-12-18 | Discharge: 2011-12-18 | Disposition: A | Discharge status: Home / Self Care | Payer: Medicare Other | Attending: Emergency Medicine | Admitting: Emergency Medicine

## 2011-12-18 DIAGNOSIS — K219 Gastro-esophageal reflux disease without esophagitis: Secondary | ICD-10-CM | POA: Insufficient documentation

## 2011-12-18 DIAGNOSIS — Z981 Arthrodesis status: Secondary | ICD-10-CM | POA: Insufficient documentation

## 2011-12-18 DIAGNOSIS — E785 Hyperlipidemia, unspecified: Secondary | ICD-10-CM | POA: Insufficient documentation

## 2011-12-18 DIAGNOSIS — G4730 Sleep apnea, unspecified: Secondary | ICD-10-CM | POA: Insufficient documentation

## 2011-12-18 DIAGNOSIS — E119 Type 2 diabetes mellitus without complications: Secondary | ICD-10-CM | POA: Insufficient documentation

## 2011-12-18 DIAGNOSIS — M79609 Pain in unspecified limb: Secondary | ICD-10-CM | POA: Insufficient documentation

## 2011-12-18 DIAGNOSIS — M5416 Radiculopathy, lumbar region: Secondary | ICD-10-CM

## 2011-12-18 DIAGNOSIS — IMO0002 Reserved for concepts with insufficient information to code with codable children: Principal | ICD-10-CM | POA: Insufficient documentation

## 2011-12-18 MED ORDER — PREDNISONE 20 MG PO TABS: 20.0000 mg | ORAL_TABLET | Freq: Every day | ORAL | Status: DC

## 2011-12-18 MED ORDER — HYDROMORPHONE HCL PF 2 MG/ML IJ SOLN
2.0000 mg | Freq: Once | INTRAMUSCULAR | Status: AC
  Administered 2011-12-18: 2 mg via INTRAVENOUS
  Filled 2011-12-18: qty 1

## 2011-12-18 MED ORDER — HYDROMORPHONE HCL PF 1 MG/ML IJ SOLN: 1.0000 mg | INTRAMUSCULAR | Status: DC | PRN

## 2011-12-18 MED ORDER — DIAZEPAM 5 MG PO TABS: 5.0000 mg | ORAL_TABLET | Freq: Four times a day (QID) | ORAL | Status: DC | PRN

## 2011-12-18 MED ORDER — SODIUM CHLORIDE 0.9 % IV SOLN
Freq: Once | INTRAVENOUS | Status: AC
  Administered 2011-12-18: 13:00:00 via INTRAVENOUS

## 2011-12-18 MED ORDER — HYDROMORPHONE HCL PF 1 MG/ML IJ SOLN
1.0000 mg | Freq: Once | INTRAMUSCULAR | Status: AC
  Administered 2011-12-18: 1 mg via INTRAVENOUS
  Filled 2011-12-18: qty 1

## 2011-12-18 MED ORDER — ONDANSETRON HCL 4 MG/2ML IJ SOLN
4.0000 mg | Freq: Once | INTRAMUSCULAR | Status: AC
  Administered 2011-12-18: 4 mg via INTRAVENOUS
  Filled 2011-12-18: qty 2

## 2011-12-18 MED ORDER — PREDNISONE 20 MG PO TABS
60.0000 mg | ORAL_TABLET | Freq: Once | ORAL | Status: AC
Start: 2011-12-18 — End: 2011-12-18
  Administered 2011-12-18: 60 mg via ORAL
  Filled 2011-12-18: qty 3

## 2011-12-18 MED ORDER — DIAZEPAM 5 MG PO TABS
10.0000 mg | ORAL_TABLET | Freq: Once | ORAL | Status: AC
  Administered 2011-12-18: 10 mg via ORAL
  Filled 2011-12-18 (×2): qty 1

## 2011-12-18 NOTE — ED Notes (Signed)
Family at bedside. 

## 2011-12-18 NOTE — ED Provider Notes (Signed)
10:20 AM Patient to move to CDU under observation, back pain protocol.  Sign out received from [facility] [place].  This is a shared visit with Dr Freida Busman.  Pt with hx spinal fusion 10/24 by Dr Yetta Barre, presenting with uncontrolled pain.  Neurosurgery has been consulted today (Dr Gerlene Fee), plan is for increased pain management on protocol.  No new imaging needed at this time.  Once patient is more comfortable, he may be d/c home with valium, refill of his dilaudid, long prednisone taper (12 days).  Pt has f/u appointment scheduled with Dr Yetta Barre for Tuesday.    11:33 AM Pt reported to be sleeping.  Will not disturb.  Pt continues on back pain protocol.   12:39 PM Pt is eating lunch.  Reports pain is now 4-5 out of 10.  Pt advised of plan.    1:48 PM Patient ambulated to and from restroom without assistance.    2:25 PM Patient reports he is currently pain free, states he feels "1000% better"  Is ready to be discharged home.  States he will need prescriptions only for valium and prednisone, states he has enough dilaudid to make it to his appointment with Dr Yetta Barre on Tuesday.  Pt d/c home with neurosurgical follow up.  Pt verbalizes understanding and agrees with plan.  Return precautions given.    [place], [place] 12/18/11 1446

## 2011-12-18 NOTE — Discharge Instructions (Signed)
Read the information below.  Please follow up with Dr Yetta Barre on Tuesday at your previously scheduled appointment.  If you develop uncontrolled pain, new weakness or numbness in your legs, fevers, loss of control of bowel or bladder, or inability to walk, return to the ER for a recheck.  Use the prescribed medication as directed.  Please discuss all new medications with your pharmacist.  You may return to the Emergency Department at any time for worsening condition or any new symptoms that concern you.    Radicular Pain Radicular pain in either the arm or leg is usually from a bulging or herniated disk in the spine. A piece of the herniated disk may press against the nerves as the nerves exit the spine. This causes pain which is felt at the tips of the nerves down the arm or leg. Other causes of radicular pain may include:  Fractures.  Heart disease.  Cancer.  An abnormal and usually degenerative state of the nervous system or nerves (neuropathy). Diagnosis may require CT or MRI scanning to determine the primary cause.  Nerves that start at the neck (nerve roots) may cause radicular pain in the outer shoulder and arm. It can spread down to the thumb and fingers. The symptoms vary depending on which nerve root has been affected. In most cases radicular pain improves with conservative treatment. Neck problems may require physical therapy, a neck collar, or cervical traction. Treatment may take many weeks, and surgery may be considered if the symptoms do not improve.  Conservative treatment is also recommended for sciatica. Sciatica causes pain to radiate from the lower back or buttock area down the leg into the foot. Often there is a history of back problems. Most patients with sciatica are better after 2 to 4 weeks of rest and other supportive care. Short term bed rest can reduce the disk pressure considerably. Sitting, however, is not a good position since this increases the pressure on the disk. You  should avoid bending, lifting, and all other activities which make the problem worse. Traction can be used in severe cases. Surgery is usually reserved for patients who do not improve within the first months of treatment. Only take over-the-counter or prescription medicines for pain, discomfort, or fever as directed by your caregiver. Narcotics and muscle relaxants may help by relieving more severe pain and spasm and by providing mild sedation. Cold or massage can give significant relief. Spinal manipulation is not recommended. It can increase the degree of disc protrusion. Epidural steroid injections are often effective treatment for radicular pain. These injections deliver medicine to the spinal nerve in the space between the protective covering of the spinal cord and back bones (vertebrae). Your caregiver can give you more information about steroid injections. These injections are most effective when given within two weeks of the onset of pain.  You should see your caregiver for follow up care as recommended. A program for neck and back injury rehabilitation with stretching and strengthening exercises is an important part of management.  SEEK IMMEDIATE MEDICAL CARE IF:  You develop increased pain, weakness, or numbness in your arm or leg.  You develop difficulty with bladder or bowel control.  You develop abdominal pain. Document Released: 03/04/2004 Document Revised: 04/19/2011 Document Reviewed: 05/20/2008 [facility] Patient Information 2013 [place], [place].

## 2011-12-18 NOTE — ED Provider Notes (Signed)
History     CSN: [id number]  Arrival date & time 12/18/11  1308   First MD Initiated Contact with Patient 12/18/11 [phone]      Chief Complaint  Patient presents with  . Leg Pain    (Consider location/radiation/quality/duration/timing/severity/associated sxs/prior treatment) HPI Comments: Patient had following procedure performed 12/02/2011 by Dr. Yetta Barre: Anterolateral retroperitoneal interbody fusion L3-4 utilizing a peek interbody cage packed with morcellized allograft, Percutaneous nonsegmental fixation utilizing pedicle screws. He presents this morning with complaint of pain that radiates into bilateral legs described as a "toothache". Patient states that he had the same pain and was seen in emergency department for this approximately one week ago. This is improved in emergency department patient was discharged home on Dilaudid and steroids. Patient has a followup with his neurosurgeon in 3 days however the pain has been unbearable for the past 2 nights. Patient denies new injuries. He denies weakness, numbness, or tingling in his legs. Has not had any problems with walking or urination. Pain is same as he had last week. He tapered off the steroids and took his last dose this morning. No fever, N/V. The onset of this condition was gradual. The course is worsening. Aggravating factors: movement. Alleviating factors: none.    The history is provided by the patient and medical records.    Past Medical History  Diagnosis Date  . Hyperlipidemia     takes Lipitor and Fish Oil daily  . Sleep apnea     uses CPAP  . Pneumonia     after surgery in 2012  . Arthritis   . Joint pain   . Joint swelling   . Chronic back pain     DDD  . Dysphagia     EGD with dilitation  . GERD (gastroesophageal reflux disease)     takes Omeprazole daily  . Multiple duodenal ulcers   . Enlarged prostate     sees a urologist  . Diabetes mellitus     takes Actos-met  . Insomnia     d/t pain    Past  Surgical History  Procedure Date  . Rotator cuff repair 70yrs ago    bilateral  . Cervical fusion 2012  . Joint replacement 2011    right knee replacement  . Cholecystectomy 12+yrs ago  . Finger surgery     right pointer  . Elbow surgery     right   . Colonoscopy   . Esophagogastroduodenoscopy   . Anterior lat lumbar fusion 12/02/2011    Procedure: ANTERIOR LATERAL LUMBAR FUSION 1 LEVEL;  Surgeon: Tia Alert, MD;  Location: [facility];  Service: Neurosurgery;  Laterality: Left;  left lumbar three-four    No family history on file.  History  Substance Use Topics  . Smoking status: Former Smoker -- 1.5 packs/day for 30 years    Types: Cigarettes  . Smokeless tobacco: Never Used     Comment: quit 25+yrs ago  . Alcohol Use: No      Review of Systems  Constitutional: Negative for fever.  HENT: Negative for sore throat and rhinorrhea.   Eyes: Negative for redness.  Respiratory: Negative for cough.   Cardiovascular: Negative for chest pain.  Gastrointestinal: Negative for nausea, vomiting, abdominal pain, diarrhea and constipation.       Neg for fecal incontinence  Genitourinary: Negative for dysuria, hematuria, flank pain and difficulty urinating.       Negative for urinary incontinence or retention  Musculoskeletal: Positive for myalgias and back pain.  Skin: Negative for rash.  Neurological: Negative for weakness and numbness.       Negative for saddle paresthesias     Allergies  Review of patient's allergies indicates no known allergies.  Home Medications   Current Outpatient Rx  Name  Route  Sig  Dispense  Refill  . ASPIRIN 81 MG PO CHEW   Oral   Chew 81 mg by mouth daily.         . ATORVASTATIN CALCIUM 80 MG PO TABS   Oral   Take 40-80 mg by mouth daily. 80 mg on Monday, Wednesday and Friday , and 40 mg all other days.         . CYANOCOBALAMIN PO   Oral   Take 1 tablet by mouth daily.         . FINASTERIDE 5 MG PO TABS   Oral    Take 5 mg by mouth daily.         . OMEGA-3 FATTY ACIDS 1000 MG PO CAPS   Oral   Take 2 g by mouth daily.         [name] Kitchen GABAPENTIN 300 MG PO CAPS   Oral   Take 600 mg by mouth 3 (three) times daily.          [name] Kitchen HYDROMORPHONE HCL 2 MG PO TABS   Oral   Take 1 tablet (2 mg total) by mouth every 4 (four) hours as needed for pain.   20 tablet   0   . METHOCARBAMOL 500 MG PO TABS   Oral   Take 500 mg by mouth 4 (four) times daily.         [name] Kitchen CITRUCEL PO   Oral   Take 2 capsules by mouth daily.         . ADULT MULTIVITAMIN W/MINERALS CH   Oral   Take 1 tablet by mouth daily.         [name] Kitchen OMEPRAZOLE 20 MG PO CPDR   Oral   Take 20 mg by mouth daily.         . OXYCODONE HCL 10 MG PO TABS   Oral   Take 1-2 tablets (10-20 mg total) by mouth every 6 (six) hours as needed for pain.   90 tablet   0   . PIOGLITAZONE HCL-METFORMIN HCL 15-500 MG PO TABS   Oral   Take 1 tablet by mouth 2 (two) times daily with a meal.         . SAW PALMETTO PO   Oral   Take 1 capsule by mouth daily.         [name] Kitchen VITAMIN D (ERGOCALCIFEROL) PO   Oral   Take 1 tablet by mouth daily.           BP 168/75  Pulse 84  Temp 98.3 F (36.8 C) (Oral)  Resp 18  SpO2 99%  Physical Exam  Nursing note and vitals reviewed. Constitutional: He appears well-developed and well-nourished.  HENT:  Head: Normocephalic and atraumatic.  Eyes: Conjunctivae normal are normal. Right eye exhibits no discharge. Left eye exhibits no discharge.  Neck: Normal range of motion. Neck supple.  Cardiovascular: Normal rate, regular rhythm and normal heart sounds.   Pulmonary/Chest: Effort normal and breath sounds normal.  Abdominal: Soft. There is no tenderness. There is no CVA tenderness.  Musculoskeletal: He exhibits no tenderness.       Right hip: He exhibits normal range of motion.  Left hip: He exhibits normal range of motion.       Right knee: Normal.       Left knee: Normal.       Right ankle:  Normal.       Left ankle: Normal.       Thoracic back: Normal. He exhibits normal range of motion and no tenderness.       Lumbar back: He exhibits tenderness (mild paraspinous muscles). He exhibits normal range of motion and no bony tenderness (no tenderness to percussion).       No step-off noted with palpation of spine. Healing surgical scars just right of midline as well as mid-lower right back. There is mild inflammation with mid-back site however it not tender, warm, or draining.   Neurological: He is alert. He has normal reflexes. No sensory deficit. He exhibits normal muscle tone.       5/5 strength in entire lower extremities bilaterally. No sensation deficit.   Skin: Skin is warm and dry.  Psychiatric: He has a normal mood and affect.    ED Course  Procedures (including critical care time)  Labs Reviewed - No data to display No results found.   1. Lumbar radiculopathy     7:02 AM Patient seen and examined. Work-up initiated. Medications ordered. D/w Dr. Freida Busman.  Vital signs reviewed and are as follows: Filed Vitals:   12/18/11 0701  BP: 168/75  Pulse: 84  Temp: 98.3 F (36.8 C)  Resp: 18   9:18 AM Patient continues in 10/10 pain. He is awake and alert. Redose dilaudid.   10:31 AM Spoke with Dr. Gerlene Fee who reccs: maximize pain control, add valium/naproxen, follow-up as planned 3 days.   Will move to CDU under back pain protocol. Patient and wife agree. On recheck, L leg pain resolved, R leg pain 7/10 from 10/10. Patient is aware of plan.   Handoff to [employer]. Plan: pain control in CDU, when stable d/c to home with pain medication, PO valium, steroids, naproxen.   MDM  Back pain protocol after recent surgery. Low suspicion for abscess. Patient has normal LE neuro exam outside of radicular pain.        Renne Crigler, [place] 12/18/11 1039

## 2011-12-18 NOTE — ED Notes (Signed)
X 3 nights of worsening bilateral leg pain. ?sciatic nerve. Taking po dilaudid and robaxin, neurotin.

## 2011-12-19 NOTE — ED Provider Notes (Signed)
Medical screening examination/treatment/procedure(s) were conducted as a shared visit with non-physician practitioner(s) and myself.  I personally evaluated the patient during the encounter  Toy Baker, MD 12/19/11 [phone]

## 2011-12-19 NOTE — ED Provider Notes (Signed)
Medical screening examination/treatment/procedure(s) were performed by non-physician practitioner and as supervising physician I was immediately available for consultation/collaboration.  Toy Baker, MD 12/19/11 [phone]

## 2012-01-24 ENCOUNTER — Ambulatory Visit
Admission: RE | Admit: 2012-01-24 | Discharge: 2012-01-24 | Disposition: A | Discharge status: Home / Self Care | Payer: Medicare Other | Source: Ambulatory Visit | Attending: Neurological Surgery | Admitting: Neurological Surgery

## 2012-01-24 ENCOUNTER — Other Ambulatory Visit: Payer: Self-pay | Admitting: Neurological Surgery

## 2012-01-24 DIAGNOSIS — M47817 Spondylosis without myelopathy or radiculopathy, lumbosacral region: Secondary | ICD-10-CM

## 2012-01-28 ENCOUNTER — Other Ambulatory Visit: Payer: Self-pay | Admitting: Neurological Surgery

## 2012-01-28 ENCOUNTER — Ambulatory Visit
Admission: RE | Admit: 2012-01-28 | Discharge: 2012-01-28 | Disposition: A | Discharge status: Home / Self Care | Payer: Medicare Other | Source: Ambulatory Visit | Attending: Neurological Surgery | Admitting: Neurological Surgery

## 2012-01-28 DIAGNOSIS — M79669 Pain in unspecified lower leg: Secondary | ICD-10-CM

## 2012-02-29 ENCOUNTER — Other Ambulatory Visit: Payer: Self-pay | Admitting: Neurological Surgery

## 2012-03-02 ENCOUNTER — Encounter (HOSPITAL_COMMUNITY): Payer: Self-pay | Admitting: Pharmacy Technician

## 2012-03-09 ENCOUNTER — Encounter (HOSPITAL_COMMUNITY)
Admission: RE | Admit: 2012-03-09 | Discharge: 2012-03-09 | Disposition: A | Discharge status: Home / Self Care | Payer: Medicare Other | Source: Ambulatory Visit | Attending: Neurological Surgery | Admitting: Neurological Surgery

## 2012-03-09 ENCOUNTER — Encounter (HOSPITAL_COMMUNITY): Payer: Self-pay

## 2012-03-09 HISTORY — DX: Constipation, unspecified: K59.00

## 2012-03-09 HISTORY — DX: Barrett's esophagus with dysplasia, unspecified: K22.719

## 2012-03-09 LAB — PROTIME-INR
INR: 0.86 (ref 0.00–1.49)
Prothrombin Time: 11.7 seconds (ref 11.6–15.2)

## 2012-03-09 LAB — BASIC METABOLIC PANEL
BUN: 17 mg/dL (ref 6–23)
CO2: 27 mEq/L (ref 19–32)
Calcium: 9.6 mg/dL (ref 8.4–10.5)
Chloride: 105 mEq/L (ref 96–112)
Creatinine, Ser: 0.81 mg/dL (ref 0.50–1.35)
GFR calc Af Amer: >90 mL/min (ref >90)
GFR calc non Af Amer: 87 mL/min — ABNORMAL LOW (ref >90)
Glucose, Bld: 146 mg/dL — ABNORMAL HIGH (ref 70–99)
Potassium: 4 mEq/L (ref 3.5–5.1)
Sodium: 141 mEq/L (ref 135–145)

## 2012-03-09 LAB — SURGICAL PCR SCREEN
MRSA, PCR: NEGATIVE
Staphylococcus aureus: NEGATIVE

## 2012-03-09 NOTE — Pre-Procedure Instructions (Signed)
[name]  03/09/2012   Your procedure is scheduled on:  Thursday, February 6th.  Report to [facility] at 5:30 AM.  Call this number if you have problems the morning of surgery: [phone]   Remember:   Do not eat food or drink liquids after midnight.    Take these medicines the morning of surgery with A SIP OF WATER: Finasteride (Proscar), Gabapentin (Neurontin), Omeprazole (Prilosec).  May take Oxycodone (Oxycontin) if needed.   Do not wear jewelry, make-up or nail polish.  Do not wear lotions, powders, or perfumes. You may wear deodorant.  Do not shave 48 hours prior to surgery. Men may shave face and neck.  Do not bring valuables to the hospital.  Contacts, dentures or bridgework may not be worn into surgery.  Leave suitcase in the car. After surgery it may be brought to your room.  For patients admitted to the hospital, checkout time is 11:00 AM the day of discharge.     Special Instructions: Shower using CHG 2 nights before surgery and the night before surgery.  If you shower the day of surgery use CHG.  Use special wash - you have one bottle of CHG for all showers.  You should use approximately 1/3 of the bottle for each shower.    Please read over the following fact sheets that you were given: Pain Booklet, Coughing and Deep Breathing, Blood Transfusion Information and Surgical Site Infection Prevention

## 2012-03-15 MED ORDER — DEXAMETHASONE SODIUM PHOSPHATE 10 MG/ML IJ SOLN
10.0000 mg | INTRAMUSCULAR | Status: AC
  Administered 2012-03-16: 10 mg via INTRAVENOUS
  Filled 2012-03-15: qty 1

## 2012-03-15 MED ORDER — CEFAZOLIN SODIUM-DEXTROSE 2-3 GM-% IV SOLR
2.0000 g | INTRAVENOUS | Status: AC
  Administered 2012-03-16: 2 g via INTRAVENOUS
  Filled 2012-03-15: qty 50

## 2012-03-16 ENCOUNTER — Encounter (HOSPITAL_COMMUNITY): Payer: Self-pay | Admitting: Anesthesiology

## 2012-03-16 ENCOUNTER — Encounter (HOSPITAL_COMMUNITY): Payer: Self-pay | Admitting: *Deleted

## 2012-03-16 ENCOUNTER — Inpatient Hospital Stay (HOSPITAL_COMMUNITY): Payer: Medicare Other

## 2012-03-16 ENCOUNTER — Encounter (HOSPITAL_COMMUNITY): Payer: Self-pay | Admitting: Neurological Surgery

## 2012-03-16 ENCOUNTER — Inpatient Hospital Stay (HOSPITAL_COMMUNITY): Payer: Medicare Other | Admitting: Anesthesiology

## 2012-03-16 ENCOUNTER — Encounter (HOSPITAL_COMMUNITY)
Admission: RE | Disposition: A | Discharge status: Home / Self Care | Payer: Self-pay | Source: Ambulatory Visit | Attending: Neurological Surgery

## 2012-03-16 ENCOUNTER — Inpatient Hospital Stay (HOSPITAL_COMMUNITY)
Admission: RE | Admit: 2012-03-16 | Discharge: 2012-03-17 | DRG: 460 | Disposition: A | Discharge status: Home / Self Care | Payer: Medicare Other | Source: Ambulatory Visit | Attending: Neurological Surgery | Admitting: Neurological Surgery

## 2012-03-16 DIAGNOSIS — N400 Enlarged prostate without lower urinary tract symptoms: Secondary | ICD-10-CM | POA: Diagnosis present

## 2012-03-16 DIAGNOSIS — M47817 Spondylosis without myelopathy or radiculopathy, lumbosacral region: Secondary | ICD-10-CM | POA: Diagnosis present

## 2012-03-16 DIAGNOSIS — E785 Hyperlipidemia, unspecified: Secondary | ICD-10-CM | POA: Diagnosis present

## 2012-03-16 DIAGNOSIS — M5137 Other intervertebral disc degeneration, lumbosacral region: Principal | ICD-10-CM | POA: Diagnosis present

## 2012-03-16 DIAGNOSIS — Z7982 Long term (current) use of aspirin: Secondary | ICD-10-CM

## 2012-03-16 DIAGNOSIS — Z981 Arthrodesis status: Secondary | ICD-10-CM

## 2012-03-16 DIAGNOSIS — E119 Type 2 diabetes mellitus without complications: Secondary | ICD-10-CM | POA: Diagnosis present

## 2012-03-16 DIAGNOSIS — Z79899 Other long term (current) drug therapy: Secondary | ICD-10-CM

## 2012-03-16 DIAGNOSIS — K219 Gastro-esophageal reflux disease without esophagitis: Secondary | ICD-10-CM | POA: Diagnosis present

## 2012-03-16 DIAGNOSIS — M51379 Other intervertebral disc degeneration, lumbosacral region without mention of lumbar back pain or lower extremity pain: Principal | ICD-10-CM | POA: Diagnosis present

## 2012-03-16 DIAGNOSIS — Z87891 Personal history of nicotine dependence: Secondary | ICD-10-CM

## 2012-03-16 DIAGNOSIS — G4730 Sleep apnea, unspecified: Secondary | ICD-10-CM | POA: Diagnosis present

## 2012-03-16 DIAGNOSIS — Z01812 Encounter for preprocedural laboratory examination: Secondary | ICD-10-CM

## 2012-03-16 DIAGNOSIS — Z96659 Presence of unspecified artificial knee joint: Secondary | ICD-10-CM

## 2012-03-16 HISTORY — PX: LUMBAR FUSION: SHX111

## 2012-03-16 LAB — TYPE AND SCREEN
ABO/RH(D): A POS
Antibody Screen: POSITIVE

## 2012-03-16 LAB — CBC WITH DIFFERENTIAL/PLATELET
Basophils Absolute: 0 10*3/uL (ref 0.0–0.1)
Basophils Relative: 1 % (ref 0–1)
Eosinophils Absolute: 0.5 10*3/uL (ref 0.0–0.7)
Eosinophils Relative: 9 % — ABNORMAL HIGH (ref 0–5)
HCT: 39.7 % (ref 39.0–52.0)
Hemoglobin: 13.6 g/dL (ref 13.0–17.0)
Lymphocytes Relative: 26 % (ref 12–46)
Lymphs Abs: 1.4 10*3/uL (ref 0.7–4.0)
MCH: 33.2 pg (ref 26.0–34.0)
MCHC: 34.3 g/dL (ref 30.0–36.0)
MCV: 96.8 fL (ref 78.0–100.0)
Monocytes Absolute: 0.7 10*3/uL (ref 0.1–1.0)
Monocytes Relative: 14 % — ABNORMAL HIGH (ref 3–12)
Neutro Abs: 2.6 10*3/uL (ref 1.7–7.7)
Neutrophils Relative %: 50 % (ref 43–77)
Platelets: 177 10*3/uL (ref 150–400)
RBC: 4.1 MIL/uL — ABNORMAL LOW (ref 4.22–5.81)
RDW: 13.4 % (ref 11.5–15.5)
WBC: 5.2 10*3/uL (ref 4.0–10.5)

## 2012-03-16 LAB — GLUCOSE, CAPILLARY
Glucose-Capillary: 121 mg/dL — ABNORMAL HIGH (ref 70–99)
Glucose-Capillary: 190 mg/dL — ABNORMAL HIGH (ref 70–99)
Glucose-Capillary: 211 mg/dL — ABNORMAL HIGH (ref 70–99)

## 2012-03-16 SURGERY — FOR MAXIMUM ACCESS (MAS) POSTERIOR LUMBAR INTERBODY FUSION (PLIF) 1 LEVEL
Anesthesia: General | Site: Back | Wound class: Clean

## 2012-03-16 MED ORDER — MENTHOL 3 MG MT LOZG: 1.0000 | LOZENGE | OROMUCOSAL | Status: DC | PRN

## 2012-03-16 MED ORDER — THROMBIN 20000 UNITS EX SOLR
CUTANEOUS | Status: DC | PRN
  Administered 2012-03-16: 08:00:00 via TOPICAL

## 2012-03-16 MED ORDER — SODIUM CHLORIDE 0.9 % IV SOLN
INTRAVENOUS | Status: AC
  Filled 2012-03-16: qty 500

## 2012-03-16 MED ORDER — MORPHINE SULFATE 2 MG/ML IJ SOLN
1.0000 mg | INTRAMUSCULAR | Status: DC | PRN
  Administered 2012-03-16: 4 mg via INTRAVENOUS
  Administered 2012-03-16: 2 mg via INTRAVENOUS
  Filled 2012-03-16 (×2): qty 2

## 2012-03-16 MED ORDER — PHENOL 1.4 % MT LIQD: 1.0000 | OROMUCOSAL | Status: DC | PRN

## 2012-03-16 MED ORDER — DEXAMETHASONE SODIUM PHOSPHATE 4 MG/ML IJ SOLN
4.0000 mg | Freq: Four times a day (QID) | INTRAMUSCULAR | Status: DC
  Administered 2012-03-17: 4 mg via INTRAVENOUS
  Filled 2012-03-16 (×5): qty 1

## 2012-03-16 MED ORDER — HYDROMORPHONE HCL PF 1 MG/ML IJ SOLN
INTRAMUSCULAR | Status: AC
  Filled 2012-03-16: qty 1

## 2012-03-16 MED ORDER — PHENYLEPHRINE HCL 10 MG/ML IJ SOLN
INTRAMUSCULAR | Status: DC | PRN
  Administered 2012-03-16 (×2): 40 ug via INTRAVENOUS

## 2012-03-16 MED ORDER — SUCCINYLCHOLINE CHLORIDE 20 MG/ML IJ SOLN
INTRAMUSCULAR | Status: DC | PRN
  Administered 2012-03-16: 120 mg via INTRAVENOUS

## 2012-03-16 MED ORDER — CEFAZOLIN SODIUM-DEXTROSE 2-3 GM-% IV SOLR: 2.0000 g | INTRAVENOUS | Status: DC

## 2012-03-16 MED ORDER — OXYCODONE-ACETAMINOPHEN 5-325 MG PO TABS
1.0000 | ORAL_TABLET | ORAL | Status: DC | PRN
  Administered 2012-03-16: 2 via ORAL
  Administered 2012-03-16: 1 via ORAL
  Filled 2012-03-16: qty 2

## 2012-03-16 MED ORDER — LIDOCAINE HCL 4 % MT SOLN
OROMUCOSAL | Status: DC | PRN
  Administered 2012-03-16: 4 mL via TOPICAL

## 2012-03-16 MED ORDER — OXYCODONE HCL 5 MG PO TABS
5.0000 mg | ORAL_TABLET | ORAL | Status: DC | PRN
  Administered 2012-03-17 (×2): 5 mg via ORAL
  Filled 2012-03-16 (×2): qty 1

## 2012-03-16 MED ORDER — SODIUM CHLORIDE 0.9 % IR SOLN
Status: DC | PRN
  Administered 2012-03-16: 07:00:00

## 2012-03-16 MED ORDER — ASPIRIN 81 MG PO CHEW: 81.0000 mg | CHEWABLE_TABLET | Freq: Every day | ORAL | Status: DC

## 2012-03-16 MED ORDER — VITAMIN B-12 100 MCG PO TABS
100.0000 ug | ORAL_TABLET | Freq: Every day | ORAL | Status: DC
  Administered 2012-03-16 – 2012-03-17 (×2): 100 ug via ORAL
  Filled 2012-03-16 (×2): qty 1

## 2012-03-16 MED ORDER — HEMOSTATIC AGENTS (NO CHARGE) OPTIME
TOPICAL | Status: DC | PRN
  Administered 2012-03-16: 1 via TOPICAL

## 2012-03-16 MED ORDER — ONDANSETRON HCL 4 MG/2ML IJ SOLN: 4.0000 mg | INTRAMUSCULAR | Status: DC | PRN

## 2012-03-16 MED ORDER — ONDANSETRON HCL 4 MG/2ML IJ SOLN
INTRAMUSCULAR | Status: DC | PRN
  Administered 2012-03-16: 4 mg via INTRAVENOUS

## 2012-03-16 MED ORDER — PIOGLITAZONE HCL 15 MG PO TABS
15.0000 mg | ORAL_TABLET | Freq: Two times a day (BID) | ORAL | Status: DC
  Administered 2012-03-16 – 2012-03-17 (×2): 15 mg via ORAL
  Filled 2012-03-16 (×4): qty 1

## 2012-03-16 MED ORDER — HYDROMORPHONE HCL PF 1 MG/ML IJ SOLN
0.2500 mg | INTRAMUSCULAR | Status: DC | PRN
  Administered 2012-03-16 (×4): 0.5 mg via INTRAVENOUS

## 2012-03-16 MED ORDER — EPHEDRINE SULFATE 50 MG/ML IJ SOLN
INTRAMUSCULAR | Status: DC | PRN
  Administered 2012-03-16: 10 mg via INTRAVENOUS
  Administered 2012-03-16: 5 mg via INTRAVENOUS
  Administered 2012-03-16: 10 mg via INTRAVENOUS
  Administered 2012-03-16 (×3): 5 mg via INTRAVENOUS
  Administered 2012-03-16: 10 mg via INTRAVENOUS

## 2012-03-16 MED ORDER — CEFAZOLIN SODIUM 1-5 GM-% IV SOLN
1.0000 g | Freq: Three times a day (TID) | INTRAVENOUS | Status: AC
  Administered 2012-03-16 – 2012-03-17 (×2): 1 g via INTRAVENOUS
  Filled 2012-03-16 (×2): qty 50

## 2012-03-16 MED ORDER — LACTATED RINGERS IV SOLN
INTRAVENOUS | Status: DC | PRN
  Administered 2012-03-16 (×3): via INTRAVENOUS

## 2012-03-16 MED ORDER — ACETAMINOPHEN 325 MG PO TABS: 650.0000 mg | ORAL_TABLET | ORAL | Status: DC | PRN

## 2012-03-16 MED ORDER — METFORMIN HCL 500 MG PO TABS
500.0000 mg | ORAL_TABLET | Freq: Two times a day (BID) | ORAL | Status: DC
  Administered 2012-03-16 – 2012-03-17 (×2): 500 mg via ORAL
  Filled 2012-03-16 (×4): qty 1

## 2012-03-16 MED ORDER — ASPIRIN EC 81 MG PO TBEC
81.0000 mg | DELAYED_RELEASE_TABLET | Freq: Every day | ORAL | Status: DC
  Administered 2012-03-16 – 2012-03-17 (×2): 81 mg via ORAL
  Filled 2012-03-16 (×2): qty 1

## 2012-03-16 MED ORDER — OXYCODONE HCL ER 10 MG PO T12A
10.0000 mg | EXTENDED_RELEASE_TABLET | Freq: Two times a day (BID) | ORAL | Status: DC
  Administered 2012-03-16 – 2012-03-17 (×2): 10 mg via ORAL
  Filled 2012-03-16 (×2): qty 1

## 2012-03-16 MED ORDER — METHOCARBAMOL 100 MG/ML IJ SOLN
500.0000 mg | Freq: Four times a day (QID) | INTRAVENOUS | Status: DC | PRN
  Administered 2012-03-16: 500 mg via INTRAVENOUS
  Filled 2012-03-16: qty 5

## 2012-03-16 MED ORDER — METHOCARBAMOL 500 MG PO TABS
500.0000 mg | ORAL_TABLET | Freq: Four times a day (QID) | ORAL | Status: DC | PRN
  Administered 2012-03-17: 500 mg via ORAL
  Filled 2012-03-16 (×2): qty 1

## 2012-03-16 MED ORDER — DEXAMETHASONE SODIUM PHOSPHATE 10 MG/ML IJ SOLN: 10.0000 mg | INTRAMUSCULAR | Status: DC

## 2012-03-16 MED ORDER — ACETAMINOPHEN 10 MG/ML IV SOLN
INTRAVENOUS | Status: AC
  Administered 2012-03-16: 1000 mg via INTRAVENOUS
  Filled 2012-03-16: qty 100

## 2012-03-16 MED ORDER — SODIUM CHLORIDE 0.9 % IJ SOLN
3.0000 mL | Freq: Two times a day (BID) | INTRAMUSCULAR | Status: DC
  Administered 2012-03-17 (×2): 3 mL via INTRAVENOUS

## 2012-03-16 MED ORDER — BUPIVACAINE HCL (PF) 0.25 % IJ SOLN
INTRAMUSCULAR | Status: DC | PRN
  Administered 2012-03-16: 3 mL

## 2012-03-16 MED ORDER — MIDAZOLAM HCL 5 MG/5ML IJ SOLN
INTRAMUSCULAR | Status: DC | PRN
  Administered 2012-03-16: 1 mg via INTRAVENOUS

## 2012-03-16 MED ORDER — DEXAMETHASONE 4 MG PO TABS
4.0000 mg | ORAL_TABLET | Freq: Four times a day (QID) | ORAL | Status: DC
  Administered 2012-03-16 (×2): 4 mg via ORAL
  Filled 2012-03-16 (×7): qty 1

## 2012-03-16 MED ORDER — ACETAMINOPHEN 10 MG/ML IV SOLN
1000.0000 mg | Freq: Four times a day (QID) | INTRAVENOUS | Status: DC
  Administered 2012-03-16 – 2012-03-17 (×2): 1000 mg via INTRAVENOUS
  Filled 2012-03-16 (×4): qty 100

## 2012-03-16 MED ORDER — ONDANSETRON HCL 4 MG/2ML IJ SOLN: 4.0000 mg | Freq: Once | INTRAMUSCULAR | Status: DC | PRN

## 2012-03-16 MED ORDER — FENTANYL CITRATE 0.05 MG/ML IJ SOLN
INTRAMUSCULAR | Status: DC | PRN
  Administered 2012-03-16: 50 ug via INTRAVENOUS
  Administered 2012-03-16: 100 ug via INTRAVENOUS

## 2012-03-16 MED ORDER — PROPOFOL 10 MG/ML IV BOLUS
INTRAVENOUS | Status: DC | PRN
  Administered 2012-03-16: 200 mg via INTRAVENOUS

## 2012-03-16 MED ORDER — LIDOCAINE HCL (CARDIAC) 20 MG/ML IV SOLN
INTRAVENOUS | Status: DC | PRN
  Administered 2012-03-16: 50 mg via INTRAVENOUS

## 2012-03-16 MED ORDER — OXYCODONE HCL 10 MG PO TB12: 10.0000 mg | ORAL_TABLET | Freq: Two times a day (BID) | ORAL | Status: DC

## 2012-03-16 MED ORDER — MORPHINE SULFATE 10 MG/ML IJ SOLN
INTRAMUSCULAR | Status: DC | PRN
  Administered 2012-03-16: 5 mg via INTRAVENOUS

## 2012-03-16 MED ORDER — 0.9 % SODIUM CHLORIDE (POUR BTL) OPTIME
TOPICAL | Status: DC | PRN
  Administered 2012-03-16: 1000 mL

## 2012-03-16 MED ORDER — ARTIFICIAL TEARS OP OINT
TOPICAL_OINTMENT | OPHTHALMIC | Status: DC | PRN
  Administered 2012-03-16: 1 via OPHTHALMIC

## 2012-03-16 MED ORDER — ACETAMINOPHEN 650 MG RE SUPP: 650.0000 mg | RECTAL | Status: DC | PRN

## 2012-03-16 MED ORDER — ALBUMIN HUMAN 5 % IV SOLN
INTRAVENOUS | Status: DC | PRN
  Administered 2012-03-16: 09:00:00 via INTRAVENOUS

## 2012-03-16 MED ORDER — PHENYLEPHRINE HCL 10 MG/ML IJ SOLN
20.0000 mg | INTRAVENOUS | Status: DC | PRN
  Administered 2012-03-16: 25 ug/min via INTRAVENOUS

## 2012-03-16 MED ORDER — PIOGLITAZONE HCL-METFORMIN HCL 15-500 MG PO TABS: 1.0000 | ORAL_TABLET | Freq: Two times a day (BID) | ORAL | Status: DC

## 2012-03-16 MED ORDER — FINASTERIDE 5 MG PO TABS
5.0000 mg | ORAL_TABLET | Freq: Every day | ORAL | Status: DC
  Administered 2012-03-16 – 2012-03-17 (×2): 5 mg via ORAL
  Filled 2012-03-16 (×2): qty 1

## 2012-03-16 MED ORDER — CELECOXIB 200 MG PO CAPS
200.0000 mg | ORAL_CAPSULE | Freq: Two times a day (BID) | ORAL | Status: DC
  Administered 2012-03-16 – 2012-03-17 (×3): 200 mg via ORAL
  Filled 2012-03-16 (×4): qty 1

## 2012-03-16 MED ORDER — PANTOPRAZOLE SODIUM 40 MG PO TBEC
40.0000 mg | DELAYED_RELEASE_TABLET | Freq: Every day | ORAL | Status: DC
  Administered 2012-03-17: 40 mg via ORAL
  Filled 2012-03-16: qty 1

## 2012-03-16 MED ORDER — POTASSIUM CHLORIDE IN NACL 20-0.9 MEQ/L-% IV SOLN
INTRAVENOUS | Status: DC
  Administered 2012-03-16 – 2012-03-17 (×2): via INTRAVENOUS
  Filled 2012-03-16 (×3): qty 1000

## 2012-03-16 MED ORDER — GABAPENTIN 300 MG PO CAPS
300.0000 mg | ORAL_CAPSULE | Freq: Three times a day (TID) | ORAL | Status: DC
  Administered 2012-03-16 – 2012-03-17 (×3): 300 mg via ORAL
  Filled 2012-03-16 (×5): qty 1

## 2012-03-16 MED ORDER — THROMBIN 5000 UNITS EX SOLR
CUTANEOUS | Status: DC | PRN
  Administered 2012-03-16: 5000 [IU] via TOPICAL

## 2012-03-16 MED ORDER — OXYCODONE-ACETAMINOPHEN 5-325 MG PO TABS
ORAL_TABLET | ORAL | Status: AC
  Filled 2012-03-16: qty 2

## 2012-03-16 MED ORDER — SODIUM CHLORIDE 0.9 % IV SOLN: 250.0000 mL | INTRAVENOUS | Status: DC

## 2012-03-16 MED ORDER — SODIUM CHLORIDE 0.9 % IJ SOLN: 3.0000 mL | INTRAMUSCULAR | Status: DC | PRN

## 2012-03-16 MED ORDER — BACITRACIN 50000 UNITS IM SOLR
INTRAMUSCULAR | Status: AC
  Filled 2012-03-16: qty 1

## 2012-03-16 SURGICAL SUPPLY — 63 items
APL SKNCLS STERI-STRIP NONHPOA (GAUZE/BANDAGES/DRESSINGS) ×1
BAG DECANTER FOR FLEXI CONT (MISCELLANEOUS) ×2 IMPLANT
BENZOIN TINCTURE PRP APPL 2/3 (GAUZE/BANDAGES/DRESSINGS) ×2 IMPLANT
BLADE SURG ROTATE 9660 (MISCELLANEOUS) IMPLANT
BONE MATRIX OSTEOCEL PLUS 5CC (Bone Implant) ×2 IMPLANT
BUR MATCHSTICK NEURO 3.0 LAGG (BURR) ×2 IMPLANT
CAGE COROENT MP 8X23 (Cage) ×4 IMPLANT
CANISTER SUCTION 2500CC (MISCELLANEOUS) ×2 IMPLANT
CLIP NEUROVISION LG (CLIP) ×2 IMPLANT
CLOTH BEACON ORANGE TIMEOUT ST (SAFETY) ×2 IMPLANT
CONT SPEC 4OZ CLIKSEAL STRL BL (MISCELLANEOUS) ×4 IMPLANT
COVER BACK TABLE 24X17X13 BIG (DRAPES) IMPLANT
COVER TABLE BACK 60X90 (DRAPES) ×2 IMPLANT
DRAPE C-ARM 42X72 X-RAY (DRAPES) ×4 IMPLANT
DRAPE C-ARMOR (DRAPES) ×2 IMPLANT
DRAPE LAPAROTOMY 100X72X124 (DRAPES) ×2 IMPLANT
DRAPE POUCH INSTRU U-SHP 10X18 (DRAPES) ×2 IMPLANT
DRAPE SURG 17X23 STRL (DRAPES) ×2 IMPLANT
DRESSING TELFA 8X3 (GAUZE/BANDAGES/DRESSINGS) ×2 IMPLANT
DRSG OPSITE 4X5.5 SM (GAUZE/BANDAGES/DRESSINGS) ×2 IMPLANT
DURAPREP 26ML APPLICATOR (WOUND CARE) ×2 IMPLANT
ELECT REM PT RETURN 9FT ADLT (ELECTROSURGICAL) ×2
ELECTRODE REM PT RTRN 9FT ADLT (ELECTROSURGICAL) ×1 IMPLANT
EVACUATOR 1/8 PVC DRAIN (DRAIN) ×2 IMPLANT
GAUZE SPONGE 4X4 16PLY XRAY LF (GAUZE/BANDAGES/DRESSINGS) IMPLANT
GLOVE BIO SURGEON STRL SZ8 (GLOVE) ×6 IMPLANT
GLOVE ECLIPSE 7.5 STRL STRAW (GLOVE) ×8 IMPLANT
GLOVE INDICATOR 8.0 STRL GRN (GLOVE) ×2 IMPLANT
GLOVE OPTIFIT SS 8.5 STRL (GLOVE) ×2 IMPLANT
GOWN BRE IMP SLV AUR LG STRL (GOWN DISPOSABLE) IMPLANT
GOWN BRE IMP SLV AUR XL STRL (GOWN DISPOSABLE) ×6 IMPLANT
GOWN STRL REIN 2XL LVL4 (GOWN DISPOSABLE) ×2 IMPLANT
HEMOSTAT POWDER KIT SURGIFOAM (HEMOSTASIS) ×2 IMPLANT
KIT BASIN OR (CUSTOM PROCEDURE TRAY) ×2 IMPLANT
KIT NEEDLE NVM5 EMG ELECT (KITS) ×1 IMPLANT
KIT NEEDLE NVM5 EMG ELECTRODE (KITS) ×1
KIT ROOM TURNOVER OR (KITS) ×2 IMPLANT
MILL MEDIUM DISP (BLADE) ×2 IMPLANT
NEEDLE HYPO 25X1 1.5 SAFETY (NEEDLE) ×2 IMPLANT
NS IRRIG 1000ML POUR BTL (IV SOLUTION) ×2 IMPLANT
PACK LAMINECTOMY NEURO (CUSTOM PROCEDURE TRAY) ×2 IMPLANT
PAD ARMBOARD 7.5X6 YLW CONV (MISCELLANEOUS) ×6 IMPLANT
ROD 35MM (Rod) ×4 IMPLANT
SCREW LOCK (Screw) ×4 IMPLANT
SCREW LOCK FXNS SPNE MAS PL (Screw) ×4 IMPLANT
SCREW MAS PLIF 5.5X30 (Screw) ×1 IMPLANT
SCREW PAS PLIF 5X30 (Screw) ×1 IMPLANT
SCREW SHANK 5.0X30MM (Screw) ×4 IMPLANT
SCREW SHANKS 5.5X35 (Screw) ×2 IMPLANT
SCREW TULIP 5.5 (Screw) ×6 IMPLANT
SPONGE LAP 4X18 X RAY DECT (DISPOSABLE) IMPLANT
SPONGE SURGIFOAM ABS GEL 100 (HEMOSTASIS) ×2 IMPLANT
STRIP CLOSURE SKIN 1/2X4 (GAUZE/BANDAGES/DRESSINGS) ×2 IMPLANT
SUT VIC AB 0 CT1 18XCR BRD8 (SUTURE) ×1 IMPLANT
SUT VIC AB 0 CT1 8-18 (SUTURE) ×1
SUT VIC AB 2-0 CP2 18 (SUTURE) ×2 IMPLANT
SUT VIC AB 3-0 SH 8-18 (SUTURE) ×2 IMPLANT
SYR 20ML ECCENTRIC (SYRINGE) ×2 IMPLANT
TOWEL OR 17X24 6PK STRL BLUE (TOWEL DISPOSABLE) ×2 IMPLANT
TOWEL OR 17X26 10 PK STRL BLUE (TOWEL DISPOSABLE) ×2 IMPLANT
TRAP SPECIMEN MUCOUS 40CC (MISCELLANEOUS) IMPLANT
TRAY FOLEY CATH 14FRSI W/METER (CATHETERS) ×2 IMPLANT
WATER STERILE IRR 1000ML POUR (IV SOLUTION) ×2 IMPLANT

## 2012-03-16 NOTE — Anesthesia Procedure Notes (Signed)
Procedure Name: Intubation Date/Time: 03/16/2012 7:42 AM Performed by: Luster Landsberg Pre-anesthesia Checklist: Patient identified, Emergency Drugs available, Suction available, Patient being monitored and Timeout performed Patient Re-evaluated:Patient Re-evaluated prior to inductionOxygen Delivery Method: Circle system utilized Preoxygenation: Pre-oxygenation with 100% oxygen Intubation Type: IV induction Ventilation: Mask ventilation without difficulty, Oral airway inserted - appropriate to patient size and Two handed mask ventilation required Laryngoscope Size: Mac and 3 Grade View: Grade II Tube type: Oral Tube size: 7.5 mm Number of attempts: 1 Airway Equipment and Method: Stylet Placement Confirmation: ETT inserted through vocal cords under direct vision,  positive ETCO2 and breath sounds checked- equal and bilateral Secured at: 23 cm Tube secured with: Tape Dental Injury: Teeth and Oropharynx as per pre-operative assessment  Comments: Anectine used r/t need for nerve monitoring post induction.

## 2012-03-16 NOTE — Op Note (Signed)
03/16/2012  11:32 AM  PATIENT:  [name]  72 y.o. 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 Decompressive lumbar laminectomy L5-S1 to decompress both the L5 and the S1 nerve roots bilaterally requiring more work than would be required of the typical PLIF procedure in order to adequately decompress the neural elements.  2. Posterior lumbar interbody fusion L5-S1 using  PEEK interbody cages packed with morcellized allograft and autograft.  3. Posterior fixation L5-S1 using nuvasive cortical pedicle screws.  4. Intertransverse arthrodesis L5-S1 right using morcellized autograft and allograft.  SURGEON:  Marikay Alar, MD  ASSISTANTS: Lovell Sheehan  ANESTHESIA:  General  EBL: 75 ml  Total I/O In: 2450 [I.V.:2200; IV Piggyback:250] Out: 625 [Urine:550; Blood:75]  BLOOD ADMINISTERED:none  DRAINS: Hemovac   INDICATION FOR PROCEDURE: This patient presented with severe back and bilateral leg pain. He failed conservative medical management. MRI showed severe spondylosis, degenerative disc disease and foraminal stenosis L5-S1. Recommended a posterior lumbar interbody fusion L5-S1. Patient understood the risks, benefits, and alternatives and potential outcomes and wished to proceed.  PROCEDURE DETAILS:  The patient was brought to the operating room. After induction of generalized endotracheal anesthesia the patient was rolled into the prone position on chest rolls and all pressure points were padded. The patient's lumbar region was cleaned and then prepped with DuraPrep and draped in the usual sterile fashion. Anesthesia was injected and then a dorsal midline incision was made and carried down to the lumbosacral fascia. The fascia was opened and the paraspinous musculature was taken down in a subperiosteal fashion to expose L5-S1. Intraoperative fluoroscopy  confirmed my level, and started by placement of the bilateral L5 cortical pedicle screws. The pedicle screw entry zones were identified utilizing AP and lateral fluoroscopy. Used intraoperative EMGs throughout the procedure. I drilled in an upward and outward direction and then tapped in the same direction monitoring the drill and tap and utilizing AP lateral fluoroscopy. I then placed 50 by 30 mm pedicle screws at L5 bilaterally. I then turned my attention to the decompression and the spinous process was removed and complete lumbar laminectomies, hemi- facetectomies, and foraminotomies were performed at L5-S1. The yellow ligament was removed to expose the underlying dura and nerve roots, and generous foraminotomies were performed to adequately decompress the neural elements. The L5 and the S1 nerve roots were involved and therefore needed decompression. Was severe foraminal stenosis compressing both L5 nerve roots. Once the decompression was complete, I turned my attention to the posterior lower lumbar interbody fusion. The epidural venous vasculature was coagulated and cut sharply. Disc space was incised and the initial discectomy was performed with pituitary rongeurs. The disc space was distracted with sequential distractors to a height of 8 mm. We then used a series of scrapers and shavers to prepare the endplates for fusion. The midline was prepared with Epstein curettes. Once the complete discectomy was finished, we packed an appropriate sized peek interbody cage with local autograft and morcellized allograft, gently retracted the nerve root, and tapped the cage into position at L5-S1 bilaterally. The midline was packed with morselized autograft and allograft. We then turned our attention to the posterior fixation at S1. The pedicle screw entry zones were identified utilizing surface landmarks and fluoroscopy. We probed each pedicle with the pedicle probe and tapped each pedicle with the appropriate tap. We  palpated with a ball probe to  assure no break in the cortex. We then placed 5 5 x 30 mm pedicle screws into the pedicles bilaterally at S1. We then decorticated the transverse processes and laid a mixture of morcellized autograft and allograft out over these to perform intertransverse arthrodesis at L5-S1 on the right. We then placed lordotic rods into the multiaxial screw heads of the pedicle screws and locked these in position with the locking caps and anti-torque device. We achieved compression of our grafts. We then checked our construct with AP and lateral fluoroscopy. Irrigated with copious amounts of bacitracin-containing saline solution. Placed a medium Hemovac drain through separate stab incision. Inspected the nerve roots once again to assure adequate decompression, lined to the dura with Gelfoam, and closed the muscle and the fascia with 0 Vicryl. Closed the subcutaneous tissues with 2-0 Vicryl and subcuticular tissues with 3-0 Vicryl. The skin was closed with benzoin and Steri-Strips. Dressing was then applied, the patient was awakened from general anesthesia and transported to the recovery room in stable condition. At the end of the procedure all sponge, needle and instrument counts were correct.   PLAN OF CARE: Admit to inpatient   PATIENT DISPOSITION:  PACU - hemodynamically stable.   Delay start of Pharmacological VTE agent (>24hrs) due to surgical blood loss or risk of bleeding:  yes

## 2012-03-16 NOTE — H&P (Signed)
Subjective: Patient is a 72 y.o. male admitted for PLIF L5-S1. Onset of symptoms was a few months ago, gradually worsening since that time.  The pain is rated severe, and is located at the across the lower back and radiates to BLE. The pain is described as aching and throbbing and occurs all day. The symptoms have been progressive. Symptoms are exacerbated by exercise and standing. MRI or CT showed DDD, stenosis L5-s1.   Past Medical History  Diagnosis Date  . Hyperlipidemia     takes Lipitor and Fish Oil daily  . Sleep apnea     uses CPAP  . Pneumonia     after surgery in 2012  . Arthritis   . Joint pain   . Joint swelling   . Chronic back pain     DDD  . Dysphagia     EGD with dilitation  . GERD (gastroesophageal reflux disease)     takes Omeprazole daily  . Multiple duodenal ulcers   . Enlarged prostate     sees a urologist  . Diabetes mellitus     takes Actos-met  . Insomnia     d/t pain  . Constipation   . Barrett's esophagus with dysplasia     Past Surgical History  Procedure Date  . Rotator cuff repair 67yrs ago    bilateral  . Cervical fusion 2012  . Joint replacement 2011    right knee replacement  . Cholecystectomy 12+yrs ago  . Finger surgery     right pointer  . Elbow surgery     right   . Colonoscopy   . Esophagogastroduodenoscopy   . Anterior lat lumbar fusion 12/02/2011    Procedure: ANTERIOR LATERAL LUMBAR FUSION 1 LEVEL;  Surgeon: Tia Alert, MD;  Location: [facility];  Service: Neurosurgery;  Laterality: Left;  left lumbar three-four    Prior to Admission medications   Medication Sig Start Date End Date Taking? Authorizing Provider  aspirin 81 MG chewable tablet Chew 81 mg by mouth daily.   Yes Historical Provider, MD  atorvastatin (LIPITOR) 80 MG tablet Take 40-80 mg by mouth daily. 80 mg on Monday, Wednesday and Friday , and 40 mg all other days.   Yes Historical Provider, MD  CYANOCOBALAMIN PO Take 1 tablet by mouth daily.    Yes  Historical Provider, MD  finasteride (PROSCAR) 5 MG tablet Take 5 mg by mouth daily.   Yes Historical Provider, MD  gabapentin (NEURONTIN) 300 MG capsule Take 300 mg by mouth 3 (three) times daily.    Yes Historical Provider, MD  Methylcellulose, Laxative, (CITRUCEL PO) Take 2 capsules by mouth daily.   Yes Historical Provider, MD  Multiple Vitamin (MULTIVITAMIN WITH MINERALS) TABS Take 1 tablet by mouth daily.   Yes Historical Provider, MD  Omega-3 Fatty Acids (FISH OIL) 1200 MG CAPS Take 2 capsules by mouth daily.   Yes Historical Provider, MD  omeprazole (PRILOSEC) 20 MG capsule Take 20 mg by mouth daily.   Yes Historical Provider, MD  oxyCODONE (OXYCONTIN) 10 MG 12 hr tablet Take 10-20 mg by mouth every 6 (six) hours as needed. For pain   Yes Historical Provider, MD  pioglitazone-metformin (ACTOPLUS MET) 15-500 MG per tablet Take 1 tablet by mouth 2 (two) times daily with a meal.   Yes Historical Provider, MD  Saw Palmetto, Serenoa repens, (SAW PALMETTO PO) Take 1 capsule by mouth daily.   Yes Historical Provider, MD  VITAMIN D, ERGOCALCIFEROL, PO Take 1 tablet by mouth  daily.   Yes Historical Provider, MD   No Known Allergies  History  Substance Use Topics  . Smoking status: Former Smoker -- 1.5 packs/day for 30 years    Types: Cigarettes  . Smokeless tobacco: Never Used     Comment: quit 25+yrs ago  . Alcohol Use: No    History reviewed. No pertinent family history.   Review of Systems  Positive ROS: neg  All other systems have been reviewed and were otherwise negative with the exception of those mentioned in the HPI and as above.  Objective: Vital signs in last 24 hours: Temp:  [97.9 F (36.6 C)] 97.9 F (36.6 C) (02/06 0620) Pulse Rate:  [55] 55  (02/06 0620) Resp:  [18] 18  (02/06 0620) BP: (101)/(66) 101/66 mmHg (02/06 0620) SpO2:  [96 %] 96 % (02/06 0620)  General Appearance: Alert, cooperative, no distress, appears stated age Head: Normocephalic, without obvious  abnormality, atraumatic Eyes: PERRL, conjunctiva/corneas clear, EOM's intact, fundi benign, both eyes      Ears: Normal TM's and external ear canals, both ears Throat: Lips, mucosa, and tongue normal; teeth and gums normal Neck: Supple, symmetrical, trachea midline, no adenopathy; thyroid: No enlargement/tenderness/nodules; no carotid bruit or JVD Back: Symmetric, no curvature, ROM normal, no CVA tenderness Lungs: Clear to auscultation bilaterally, respirations unlabored Heart: Regular rate and rhythm, S1 and S2 normal, no murmur, rub or gallop Abdomen: Soft, non-tender, bowel sounds active all four quadrants, no masses, no organomegaly Extremities: Extremities normal, atraumatic, no cyanosis or edema Pulses: 2+ and symmetric all extremities Skin: Skin color, texture, turgor normal, no rashes or lesions  NEUROLOGIC:   Mental status: Alert and oriented x4,  no aphasia, good attention span, fund of knowledge, and memory Motor Exam - grossly normal Sensory Exam - grossly normal Reflexes: 1+ Coordination - grossly normal Gait - grossly normal Balance - grossly normal Cranial Nerves: I: smell Not tested  II: visual acuity  OS: nl    OD: nl  II: visual fields Full to confrontation  II: pupils Equal, round, reactive to light  III,VII: ptosis None  III,IV,VI: extraocular muscles  Full ROM  V: mastication Normal  V: facial light touch sensation  Normal  V,VII: corneal reflex  Present  VII: facial muscle function - upper  Normal  VII: facial muscle function - lower Normal  VIII: hearing Not tested  IX: soft palate elevation  Normal  IX,X: gag reflex Present  XI: trapezius strength  5/5  XI: sternocleidomastoid strength 5/5  XI: neck flexion strength  5/5  XII: tongue strength  Normal    Data Review Lab Results  Component Value Date   WBC 5.2 03/16/2012   HGB 13.6 03/16/2012   HCT 39.7 03/16/2012   MCV 96.8 03/16/2012   PLT 177 03/16/2012   Lab Results  Component Value Date   NA 141  03/09/2012   K 4.0 03/09/2012   CL 105 03/09/2012   CO2 27 03/09/2012   BUN 17 03/09/2012   CREATININE 0.81 03/09/2012   GLUCOSE 146* 03/09/2012   Lab Results  Component Value Date   INR 0.86 03/09/2012    Assessment/Plan: Patient admitted for PLIF L5-S1. Patient has failed conservative therapy.  I explained the condition and procedure to the patient and answered any questions.  Patient wishes to proceed with procedure as planned. Understands risks/ benefits and typical outcomes of procedure.   JONES,DAVID S 03/16/2012 7:24 AM

## 2012-03-16 NOTE — Anesthesia Preprocedure Evaluation (Signed)
Anesthesia Evaluation  Patient identified by MRN, date of birth, ID band Patient awake    Reviewed: Allergy & Precautions, H&P , NPO status , Patient's Chart, lab work & pertinent test results  Airway Mallampati: I TM Distance: >3 FB Neck ROM: full    Dental   Pulmonary sleep apnea , COPDCurrent Smoker,          Cardiovascular Rhythm:regular Rate:Normal     Neuro/Psych    GI/Hepatic GERD-  ,  Endo/Other  diabetes, Type 2, Oral Hypoglycemic Agents  Renal/GU      Musculoskeletal   Abdominal   Peds  Hematology   Anesthesia Other Findings   Reproductive/Obstetrics                           Anesthesia Physical Anesthesia Plan  ASA: III  Anesthesia Plan: General   Post-op Pain Management:    Induction: Intravenous  Airway Management Planned: Oral ETT  Additional Equipment:   Intra-op Plan:   Post-operative Plan: Extubation in OR  Informed Consent: I have reviewed the patients History and Physical, chart, labs and discussed the procedure including the risks, benefits and alternatives for the proposed anesthesia with the patient or authorized representative who has indicated his/her understanding and acceptance.     Plan Discussed with: CRNA, Anesthesiologist and Surgeon  Anesthesia Plan Comments:         Anesthesia Quick Evaluation

## 2012-03-16 NOTE — Transfer of Care (Signed)
Immediate Anesthesia Transfer of Care Note  Patient: [name]  Procedure(s) Performed: Procedure(s) (LRB) with comments: MAXIMUM ACCESS (MAS) POSTERIOR LUMBAR INTERBODY FUSION (PLIF) 1 LEVEL (N/A) - LUMBAR FIVE SACRAL ONE MAXIMUM ACCESS SURGERY POSTERIOR LUMBAR INTERBODY FUSION  Patient Location: PACU  Anesthesia Type:General  Level of Consciousness: awake  Airway & Oxygen Therapy: Patient Spontanous Breathing and Patient connected to nasal cannula oxygen  Post-op Assessment: Report given to PACU RN, Post -op Vital signs reviewed and stable and Patient moving all extremities  Post vital signs: Reviewed and stable  Complications: No apparent anesthesia complications

## 2012-03-16 NOTE — Anesthesia Postprocedure Evaluation (Signed)
  Anesthesia Post-op Note  Patient: [name]  Procedure(s) Performed: Procedure(s) (LRB) with comments: MAXIMUM ACCESS (MAS) POSTERIOR LUMBAR INTERBODY FUSION (PLIF) 1 LEVEL (N/A) - LUMBAR FIVE SACRAL ONE MAXIMUM ACCESS SURGERY POSTERIOR LUMBAR INTERBODY FUSION  Patient Location: PACU  Anesthesia Type:General  Level of Consciousness: awake, alert , oriented and patient cooperative  Airway and Oxygen Therapy: Patient Spontanous Breathing  Post-op Pain: mild  Post-op Assessment: Post-op Vital signs reviewed, Patient's Cardiovascular Status Stable, Respiratory Function Stable, Patent Airway, No signs of Nausea or vomiting and Pain level controlled  Post-op Vital Signs: stable  Complications: No apparent anesthesia complications

## 2012-03-16 NOTE — OR Nursing (Signed)
Needle electrodes removed at end of case  

## 2012-03-17 LAB — GLUCOSE, CAPILLARY
Glucose-Capillary: 187 mg/dL — ABNORMAL HIGH (ref 70–99)
Glucose-Capillary: 199 mg/dL — ABNORMAL HIGH (ref 70–99)
Glucose-Capillary: 153 mg/dL — ABNORMAL HIGH (ref 70–99)

## 2012-03-17 MED ORDER — OXYCODONE HCL 5 MG PO TABS: 10.0000 mg | ORAL_TABLET | ORAL | Status: DC | PRN

## 2012-03-17 MED ORDER — OXYCODONE HCL 10 MG PO TABS: 10.0000 mg | ORAL_TABLET | ORAL | Status: DC | PRN

## 2012-03-17 MED ORDER — METHOCARBAMOL 500 MG PO TABS: 500.0000 mg | ORAL_TABLET | Freq: Four times a day (QID) | ORAL | Status: DC | PRN

## 2012-03-17 NOTE — Discharge Summary (Signed)
Physician Discharge Summary  Patient ID: [name] MRN: [id number] DOB/AGE: 07/30/1940 72 y.o.  Admit date: 03/16/2012 Discharge date: 03/17/2012  Admission Diagnoses: Lumbar DDD/ spondylosis    Discharge Diagnoses: same   Discharged Condition: good  Hospital Course: The patient was admitted on 03/16/2012 and taken to the operating room where the patient underwent PLIF L5-S1. The patient tolerated the procedure well and was taken to the recovery room and then to the floor in stable condition. The hospital course was routine. There were no complications. The wound remained clean dry and intact. Pt had appropriate back soreness. No complaints of leg pain or new N/T/W. The patient remained afebrile with stable vital signs, and tolerated a regular diet. The patient continued to increase activities, and pain was well controlled with oral pain medications.   Consults: None  Significant Diagnostic Studies:  Results for orders placed during the hospital encounter of 03/16/12  TYPE AND SCREEN      Component Value Range   ABO/RH(D) A POS     Antibody Screen POS     Sample Expiration 03/19/2012    CBC WITH DIFFERENTIAL      Component Value Range   WBC 5.2  4.0 - 10.5 K/uL   RBC 4.10 (*) 4.22 - 5.81 MIL/uL   Hemoglobin 13.6  13.0 - 17.0 g/dL   HCT 40.9  81.1 - 91.4 %   MCV 96.8  78.0 - 100.0 fL   MCH 33.2  26.0 - 34.0 pg   MCHC 34.3  30.0 - 36.0 g/dL   RDW 78.2  95.6 - 21.3 %   Platelets 177  150 - 400 K/uL   Neutrophils Relative 50  43 - 77 %   Neutro Abs 2.6  1.7 - 7.7 K/uL   Lymphocytes Relative 26  12 - 46 %   Lymphs Abs 1.4  0.7 - 4.0 K/uL   Monocytes Relative 14 (*) 3 - 12 %   Monocytes Absolute 0.7  0.1 - 1.0 K/uL   Eosinophils Relative 9 (*) 0 - 5 %   Eosinophils Absolute 0.5  0.0 - 0.7 K/uL   Basophils Relative 1  0 - 1 %   Basophils Absolute 0.0  0.0 - 0.1 K/uL  GLUCOSE, CAPILLARY      Component Value Range   Glucose-Capillary 121 (*) 70 - 99 mg/dL  GLUCOSE,  CAPILLARY      Component Value Range   Glucose-Capillary 190 (*) 70 - 99 mg/dL  GLUCOSE, CAPILLARY      Component Value Range   Glucose-Capillary 211 (*) 70 - 99 mg/dL  GLUCOSE, CAPILLARY      Component Value Range   Glucose-Capillary 187 (*) 70 - 99 mg/dL   Comment 1 Documented in Chart     Comment 2 Notify RN    GLUCOSE, CAPILLARY      Component Value Range   Glucose-Capillary 199 (*) 70 - 99 mg/dL   Comment 1 Documented in Chart     Comment 2 Notify RN      Dg Lumbar Spine 2-3 Views  03/16/2012  *RADIOLOGY REPORT*  Clinical Data: L5-S1 PLIF.  DG C-ARM GT 120 [name], LUMBAR SPINE - 2-3 VIEW  Technique: Two fluoroscopic spot views of the lower lumbar spine are provided.  Comparison:  Plain films lumbar spine 02/29/2012.  Findings: New pedicle screws and stabilization bars are in place at L5-S1.  Prior L3-4 fusion is identified with pedicle screws and stabilization bar on the left.  No acute finding  is identified.  IMPRESSION: L5-S1 PLIF.   Original Report Authenticated By: Holley Dexter, M.D.    Dg C-arm Gt 120 [name]  03/16/2012  *RADIOLOGY REPORT*  Clinical Data: L5-S1 PLIF.  DG C-ARM GT 120 [name], LUMBAR SPINE - 2-3 VIEW  Technique: Two fluoroscopic spot views of the lower lumbar spine are provided.  Comparison:  Plain films lumbar spine 02/29/2012.  Findings: New pedicle screws and stabilization bars are in place at L5-S1.  Prior L3-4 fusion is identified with pedicle screws and stabilization bar on the left.  No acute finding is identified.  IMPRESSION: L5-S1 PLIF.   Original Report Authenticated By: Holley Dexter, M.D.     Antibiotics:  Anti-infectives     Start     Dose/Rate Route Frequency Ordered Stop   03/16/12 1600   ceFAZolin (ANCEF) IVPB 1 g/50 mL premix        1 g 100 mL/hr over 30 Minutes Intravenous Every 8 hours 03/16/12 1352 03/17/12 0034   03/16/12 0720   bacitracin 50,000 Units in sodium chloride irrigation 0.9 % 500 mL irrigation  Status:  Discontinued          As  needed 03/16/12 0849 03/16/12 1104   03/16/12 0713   bacitracin 16109 UNITS injection     Comments: RATCLIFF, ESTHER: cabinet override         03/16/12 0713 03/16/12 1914   03/16/12 0630   ceFAZolin (ANCEF) IVPB 2 g/50 mL premix  Status:  Discontinued        2 g 100 mL/hr over 30 Minutes Intravenous On call to O.R. 03/16/12 6045 03/16/12 4098   03/16/12 0600   ceFAZolin (ANCEF) IVPB 2 g/50 mL premix        2 g 100 mL/hr over 30 Minutes Intravenous On call to O.R. 03/15/12 1422 03/16/12 0747          Discharge Exam: Blood pressure 109/62, pulse 75, temperature 97.8 F (36.6 C), temperature source Oral, resp. rate 18, height 5\' 7"  (1.702 m), weight 96.53 kg (212 lb 13 oz), SpO2 96.00%. Neurologic: Grossly normal Incision CDI  Discharge Medications:     Medication List     As of 03/17/2012  8:40 AM    TAKE these medications         aspirin 81 MG chewable tablet   Chew 81 mg by mouth daily.      atorvastatin 80 MG tablet   Commonly known as: LIPITOR   Take 40-80 mg by mouth daily. 80 mg on Monday, Wednesday and Friday , and 40 mg all other days.      CITRUCEL PO   Take 2 capsules by mouth daily.      CYANOCOBALAMIN PO   Take 1 tablet by mouth daily.      finasteride 5 MG tablet   Commonly known as: PROSCAR   Take 5 mg by mouth daily.      Fish Oil 1200 MG Caps   Take 2 capsules by mouth daily.      gabapentin 300 MG capsule   Commonly known as: NEURONTIN   Take 300 mg by mouth 3 (three) times daily.      methocarbamol 500 MG tablet   Commonly known as: ROBAXIN   Take 1 tablet (500 mg total) by mouth every 6 (six) hours as needed.      multivitamin with minerals Tabs   Take 1 tablet by mouth daily.      omeprazole 20 MG capsule   Commonly known  as: PRILOSEC   Take 20 mg by mouth daily.      Oxycodone HCl 10 MG Tabs   Take 1 tablet (10 mg total) by mouth every 4 (four) hours as needed.      oxyCODONE 10 MG 12 hr tablet   Commonly known as: OXYCONTIN    Take 10-20 mg by mouth every 6 (six) hours as needed. For pain      pioglitazone-metformin 15-500 MG per tablet   Commonly known as: ACTOPLUS MET   Take 1 tablet by mouth 2 (two) times daily with a meal.      SAW PALMETTO PO   Take 1 capsule by mouth daily.      VITAMIN D (ERGOCALCIFEROL) PO   Take 1 tablet by mouth daily.        Disposition: home  Final Dx: PLIF L5-S1      Discharge Orders    Future Orders Please Complete By Expires   Diet - low sodium heart healthy      Increase activity slowly      Discharge instructions      Comments:   No twisting, bending or lifting   Call MD for:  temperature >100.4      Call MD for:  persistant nausea and vomiting      Call MD for:  severe uncontrolled pain      Call MD for:  redness, tenderness, or signs of infection (pain, swelling, redness, odor or green/yellow discharge around incision site)      Call MD for:  difficulty breathing, headache or visual disturbances      Driving Restrictions      Comments:   2 weeks   Lifting restrictions      Comments:   Less than 10 lbs      Follow-up Information    Follow up with Ringo Sherod S, MD. Schedule an appointment as soon as possible for a visit in 2 weeks.   Contact information:   [address] [place] [address] [phone]           Signed: Tia Alert 03/17/2012, 8:40 AM

## 2012-03-17 NOTE — Progress Notes (Addendum)
PT Cancellation Note  Patient Details Name: [name] MRN: [id number] DOB: [birth year]   Cancelled Treatment:    Reason Eval/Treat Not Completed: Other (comment) (per OT Tory Emerald, pt. is fully independent and has no PT needs   Ferman Hamming  03/17/2012, 9:41 AM Weldon Picking PT [facility] [phone] Beeper [phone]

## 2012-03-17 NOTE — Progress Notes (Signed)
Rn reviewed d/c instructions with pt with family present, pt verbalized understanding, pt stated he would like to eat lunch befoer going home. Pt waited and eat lunch and was taken down in a wheelchair by the nurse tech to his car.----Taishawn Smaldone, rn

## 2012-03-17 NOTE — Care Management Note (Signed)
    Page 1 of 1   03/17/2012     12:54:54 PM   CARE MANAGEMENT NOTE 03/17/2012  Patient:  [name], [name]   Account Number:  [id number]  Date Initiated:  03/16/2012  Documentation initiated by:  [facility]  Subjective/Objective Assessment:   admitted postop L5-S1 PLIF     Action/Plan:   PT/OT evals-no folow up needs, patient already has rolling walker and 3N1, no equipment needs   Anticipated DC Date:  03/17/2012   Anticipated DC Plan:  HOME/SELF CARE      DC Planning Services  CM consult      Choice offered to / List presented to:             Status of service:  Completed, signed off Medicare Important Message given?   (If response is "NO", the following Medicare IM given date fields will be blank) Date Medicare IM given:   Date Additional Medicare IM given:    Discharge Disposition:  HOME/SELF CARE  Per UR Regulation:  Reviewed for med. necessity/level of care/duration of stay  If discussed at Long Length of Stay Meetings, dates discussed:    Comments:  03/17/12 Spoke with patient, he has a rolling walker and 3N1, no equipment needed. Jacquelynn Cree RN, BSN, CCM

## 2012-03-17 NOTE — Evaluation (Signed)
Occupational Therapy Evaluation and Discharge Summary Patient Details Name: [name] MRN: [id number] DOB: [birth year] Today's Date: 03/17/2012 Time: 4098-1191 OT Time Calculation (min): 10 min  OT Assessment / Plan / Recommendation Clinical Impression  Pt is a 72 yo male admitted for PLIF surgery who is very familiar with back precautions and the adl routine from previous surgery.  Pt with no acute OT needs and currently does not need assist for adls.     OT Assessment  Patient does not need any further OT services    Follow Up Recommendations  No OT follow up    Barriers to Discharge      Equipment Recommendations  None recommended by OT    Recommendations for Other Services    Frequency       Precautions / Restrictions Precautions Precautions: Back Precaution Comments: Pt familiar w precautions from old back surgery Required Braces or Orthoses: Spinal Brace Spinal Brace: Lumbar corset;Applied in sitting position Restrictions Weight Bearing Restrictions: No   Pertinent Vitals/Pain Pt with 0/10 pain.  Vitals are stable.    ADL  Eating/Feeding: Performed;Independent Where Assessed - Eating/Feeding: Chair Grooming: Performed;Wash/dry hands;Wash/dry face;Teeth care;Modified independent Where Assessed - Grooming: Unsupported standing Upper Body Bathing: Performed;Set up Where Assessed - Upper Body Bathing: Unsupported sitting Lower Body Bathing: Performed;Set up Where Assessed - Lower Body Bathing: Unsupported sit to stand Upper Body Dressing: Performed;Independent Where Assessed - Upper Body Dressing: Unsupported sitting Lower Body Dressing: Performed;Set up Where Assessed - Lower Body Dressing: Unsupported sit to stand Toilet Transfer: Performed;Modified independent Toilet Transfer Method: Sit to Barista: Comfort height toilet;Grab bars Toileting - Clothing Manipulation and Hygiene: Performed;Independent Where Assessed - Toileting Clothing  Manipulation and Hygiene: Standing Equipment Used: Back brace Transfers/Ambulation Related to ADLs: independent ADL Comments: independent    OT Diagnosis:    OT Problem List:   OT Treatment Interventions:     OT Goals    Visit Information  Last OT Received On: 03/17/12 Assistance Needed: +1    Subjective Data  Subjective: "I am ready to go home." Patient Stated Goal: to go home    Prior Functioning     Home Living Lives With: Spouse Available Help at Discharge: Available 24 hours/day Type of Home: House Home Access: Stairs to enter [employer] of Steps: 3 Home Layout: One level Bathroom Shower/Tub: Walk-in shower;Door [employer] Toilet: Handicapped height Bathroom Accessibility: Yes How Accessible: Accessible via walker Home Adaptive Equipment: Shower chair with back;Walker - rolling Additional Comments: Pt has all necessary equipment Prior Function Level of Independence: Independent Able to Take Stairs?: Yes Driving: Yes Vocation: Retired Musician: No difficulties Dominant Hand: Right         Vision/Perception Vision - History Baseline Vision: No visual deficits Patient Visual Report: No change from baseline Vision - Assessment Vision Assessment: Vision not tested   [employer] Overall Cognitive Status: Appears within functional limits for tasks assessed/performed Arousal/Alertness: Awake/alert Orientation Level: Oriented X4 / Intact Behavior During Session: WFL for tasks performed Cognition - Other Comments: intact    Extremity/Trunk Assessment Right Upper Extremity Assessment RUE ROM/Strength/Tone: Within functional levels RUE Sensation: WFL - Light Touch RUE Coordination: WFL - gross/fine motor Left Upper Extremity Assessment LUE ROM/Strength/Tone: Within functional levels LUE Sensation: WFL - Light Touch LUE Coordination: WFL - gross/fine motor Trunk Assessment Trunk Assessment: Normal     Mobility  Bed Mobility Bed Mobility: Supine to Sit Supine to Sit: 7: Independent Details for Bed Mobility Assistance: no assist  needed.  Good proper body mechanics. Transfers Transfers: Sit to Stand;Stand to Sit Sit to Stand: 7: Independent Stand to Sit: 7: Independent Details for Transfer Assistance: safe and Independent     Exercise     Balance Balance Balance Assessed: No   End of Session OT - End of Session Equipment Utilized During Treatment: Back brace Activity Tolerance: Patient tolerated treatment well Patient left: in chair;with call bell/phone within reach Nurse Communication: Mobility status  GO     [name] 03/17/2012, 9:15 AM [phone]

## 2012-03-18 LAB — TYPE AND SCREEN
ABO/RH(D): A POS
Antibody Screen: POSITIVE
DAT, IgG: NEGATIVE
Unit division: 0
Unit division: 0

## 2012-04-10 ENCOUNTER — Encounter (HOSPITAL_BASED_OUTPATIENT_CLINIC_OR_DEPARTMENT_OTHER): Payer: Medicare Other | Attending: Plastic Surgery

## 2012-04-10 DIAGNOSIS — L98499 Non-pressure chronic ulcer of skin of other sites with unspecified severity: Secondary | ICD-10-CM | POA: Insufficient documentation

## 2012-04-10 LAB — GLUCOSE, CAPILLARY: Glucose-Capillary: 119 mg/dL — ABNORMAL HIGH (ref 70–99)

## 2012-04-11 NOTE — Progress Notes (Signed)
[facility]  NAME:  [name], [name]                ACCOUNT NO.:  [id number]  MEDICAL RECORD NO.:  [id number]      DATE OF BIRTH:  03/22/1940  PHYSICIAN:  Wayland Denis, DO       VISIT DATE:  04/10/2012                                  OFFICE VISIT   HISTORY OF PRESENT ILLNESS:  The patient is a 72 year old gentleman who is here for evaluation of his back ulcer that was a nonhealing surgical wound from back surgery done a month ago with Dr. Yetta Barre.  He has an eschar over the area.  It is linear, midline.  He is doing much better from a standpoint of why he had the back surgery.  He states that nothing is really helping the surgical wound at this time.  PAST MEDICAL HISTORY:  Positive for diabetes, gastroesophageal reflux, and neuropathy.  PAST SURGICAL HISTORY:  Back fusion, cervical displacement, rotator cuff surgery, cholecystectomy.  MEDICATIONS:  Centrum Silver, __________ fish oil, Prilosec, gabapentin, Robaxin, oxycodone, aspirin, atorvastatin, metformin, multivitamin, vitamin D, and finasteride.  ALLERGIES:  No known drug allergies.  SOCIAL HISTORY:  He is married.  Lives at home with his wife in [place].  He is not smoking.  REVIEW OF SYSTEMS:  Otherwise negative.  PHYSICAL EXAMINATION:  GENERAL:  He is alert, oriented, cooperative, not in any acute distress.  He is very pleasant. HEENT:  His pupils were equal.  Extraocular muscles were intact. NECK:  No cervical lymphadenopathy. LUNGS:  His breathing is unlabored. HEART:  His heart rate is regular. ABDOMEN:  The scar on the lateral aspect of his back abdomen area is very well healed.  This is midline and has a thick eschar over the area and it is 4.5 x 0.3 x 0.2 cm.  It does not appear to be infected.  The plan will be for increasing protein, diabetic control.  I did debride the area and those notes are noted, and we will use Santyl over the next week, and then hopefully we can move to  Endoform.     Wayland Denis, DO    CS/MEDQ  D:  04/10/2012  T:  04/10/2012  Job:  [id number]

## 2012-04-18 NOTE — Progress Notes (Signed)
[facility]  NAME:  [name], [name]                ACCOUNT NO.:  [id number]  MEDICAL RECORD NO.:  [id number]      DATE OF BIRTH:  Jun 08, 1940  PHYSICIAN:  Wayland Denis, DO       VISIT DATE:  04/17/2012                                  OFFICE VISIT   The patient is a 72 year old gentleman who is here for followup on his sacral ulcer.  He has been using the Santyl daily with marked improvement.  There has been no change in his medications or social history.  On exam, he is alert, oriented, cooperative, not in any acute distress. He is very pleasant.  Pupils equal.  Extraocular muscles are intact.  No cervical lymphadenopathy.  His breathing is unlabored.  His heart rate is regular.  The wound has improved with the middle portion of it and healing up the superior and inferior and has still a little bit of fibrous tissue, but it is much smaller and deep, not as deep as it had been.  We will continue with the Santyl and hopefully in the next week or 2.  We will be able to add a collagen.     Wayland Denis, DO     CS/MEDQ  D:  04/17/2012  T:  04/18/2012  Job:  [id number]

## 2012-05-09 NOTE — Progress Notes (Signed)
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  This was originally a surgical nonhealing ulcer. He is doing better overall.  He has been using the Santyl.  The numbers look a little bit bigger, but the area actually looks better.  He has no change in his social history.  No recent illnesses.  MEDICATIONS:  Unchanged.  PHYSICAL EXAMINATION:  GENERAL:  On exam, he is alert, oriented, cooperative, not in any acute distress.  He is very pleasant like usual. HEENT:  Pupils are equal. LUNGS:  His breathing is unlabored. HEART:  Regular. ABDOMEN:  Large, but soft. BACK:  Debrided and these notes were noted.  We will continue with the Santyl and plan to add collagen likely next week.  I have given him instructions to continue with the focus on his protein intake, and we will see him back in a week.     Wayland Denis, DO     CS/MEDQ  D:  05/08/2012  T:  05/09/2012  Job:  [id number]

## 2012-05-15 ENCOUNTER — Encounter (HOSPITAL_BASED_OUTPATIENT_CLINIC_OR_DEPARTMENT_OTHER): Payer: Medicare Other | Attending: Plastic Surgery

## 2012-05-15 DIAGNOSIS — L98499 Non-pressure chronic ulcer of skin of other sites with unspecified severity: Secondary | ICD-10-CM | POA: Insufficient documentation

## 2012-05-16 NOTE — Progress Notes (Signed)
[facility]  NAME:  [name], [name]                ACCOUNT NO.:  [id number]  MEDICAL RECORD NO.:  [id number]      DATE OF BIRTH:  Apr 23, 1940  PHYSICIAN:  Wayland Denis, DO       VISIT DATE:  05/15/2012                                  OFFICE VISIT   the patient is a 72 year old gentleman who is here for followup on his back ulcer.  He has been using Santyl on the area and shows remarkable improvement over the last several weeks.  He is alert, oriented, cooperative, not in any acute distress.  He is pupils equal.  Extraocular muscles intact.  No cervical lymphadenopathy. His breathing is unlabored.  His heart is regular.  The ulcer is filling in.  There is less fibrous tissue with better granulation tissue.  There has been no change in his medications or social history.  He will continue with the Santyl and we will see him back in a week.  Thank you.     Wayland Denis, DO     CS/MEDQ  D:  05/15/2012  T:  05/16/2012  Job:  [id number]

## 2012-05-30 NOTE — Progress Notes (Signed)
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  He has done extremely well with the treatment modalities and at this point has healed.  The back area is completely epithelialized and no open area. He has not had any change in his medications.  SOCIAL HISTORY:  He is not smoking.  PHYSICAL EXAMINATION:  He is alert, oriented, cooperative, not in any acute distress.  He is very pleasant.  His pupils are equal.  His breathing is unlabored.  His heart is regular.  The VAC is completely healed.  No sign of infection.  The skin is dry.  I recommend a few days of triple antibiotic ointment and then lotion and also to add a T-shirt under his regular shirt if it is not cotton to help with decreasing the irritation.  We will see him back as needed.     Wayland Denis, DO     CS/MEDQ  D:  05/29/2012  T:  05/29/2012  Job:  [id number]

## 2015-01-29 ENCOUNTER — Other Ambulatory Visit: Payer: Self-pay | Admitting: Neurological Surgery

## 2015-01-29 DIAGNOSIS — M545 Low back pain: Secondary | ICD-10-CM

## 2015-02-13 ENCOUNTER — Ambulatory Visit
Admission: RE | Admit: 2015-02-13 | Discharge: 2015-02-13 | Disposition: A | Discharge status: Home / Self Care | Payer: Self-pay | Source: Ambulatory Visit | Attending: Neurological Surgery | Admitting: Neurological Surgery

## 2015-02-13 DIAGNOSIS — M545 Low back pain: Secondary | ICD-10-CM

## 2015-03-11 ENCOUNTER — Other Ambulatory Visit: Payer: Self-pay | Admitting: Orthopedic Surgery

## 2015-03-11 DIAGNOSIS — M25562 Pain in left knee: Secondary | ICD-10-CM

## 2015-03-11 DIAGNOSIS — M1712 Unilateral primary osteoarthritis, left knee: Secondary | ICD-10-CM

## 2015-03-13 ENCOUNTER — Ambulatory Visit
Admission: RE | Admit: 2015-03-13 | Discharge: 2015-03-13 | Disposition: A | Discharge status: Home / Self Care | Payer: Medicare Other | Source: Ambulatory Visit | Attending: Orthopedic Surgery | Admitting: Orthopedic Surgery

## 2015-03-13 ENCOUNTER — Other Ambulatory Visit: Payer: Self-pay | Admitting: Neurological Surgery

## 2015-03-13 DIAGNOSIS — M1712 Unilateral primary osteoarthritis, left knee: Secondary | ICD-10-CM

## 2015-03-13 DIAGNOSIS — M5136 Other intervertebral disc degeneration, lumbar region: Secondary | ICD-10-CM

## 2015-03-13 DIAGNOSIS — M25562 Pain in left knee: Secondary | ICD-10-CM

## 2015-03-20 ENCOUNTER — Ambulatory Visit
Admission: RE | Admit: 2015-03-20 | Discharge: 2015-03-20 | Disposition: A | Discharge status: Home / Self Care | Payer: Medicare Other | Source: Ambulatory Visit | Attending: Neurological Surgery | Admitting: Neurological Surgery

## 2015-03-20 DIAGNOSIS — M51369 Other intervertebral disc degeneration, lumbar region without mention of lumbar back pain or lower extremity pain: Secondary | ICD-10-CM

## 2015-03-20 DIAGNOSIS — M5136 Other intervertebral disc degeneration, lumbar region: Secondary | ICD-10-CM

## 2015-03-21 ENCOUNTER — Other Ambulatory Visit (HOSPITAL_COMMUNITY): Payer: Self-pay | Admitting: Neurological Surgery

## 2015-04-07 NOTE — Pre-Procedure Instructions (Signed)
[name]  04/07/2015      [facility] - [place], [place] - [address] [place] [address] Phone: [phone] Fax: [phone]    Your procedure is scheduled on Wednesday, March 8.   Report to [facility] Admitting at 6:30A.M.                Your surgery or procedure is scheduled for 8:30 AM   Call this number if you have problems the morning of surgery: [phone]                   For any other questions, please call [phone], Monday - Friday 8 AM - 4 PM.   Remember:  Do not eat food or drink liquids after midnight Tuesday, March 7.  Take these medicines the morning of surgery with A SIP OF WATER: Atorvastatin, finasteride (PROSCAR), omeprazole (PRILOSEC), tamsulosin (FLOMAX).                If needed and you can tolerate on an empty stomach, you may take pain medication.               Wednesday, March 1;  STOP taking Aspirin, Coumadin, Plavix, Effient and Herbal medications, Vitamins.  Do not take any NSAIDs ie: Ibuprofen,  Advil,Naproxen or any medication containing Aspirin.                            How to Manage Your Diabetes Before Surgery  What Do I Do About My Diabetic medications: Do not take oral diabetes medicines (pills) the morning of surgery.  Why is it important to control my blood sugar before and after surgery?   Improving blood sugar levels before and after surgery helps healing and can limit problems.  A way of improving blood sugar control is eating a healthy diet by:  - Eating less sugar and carbohydrates  - Increasing activity/exercise  - Talk with your doctor about reaching your blood sugar goals  High blood sugars (greater than 180 mg/dL) can raise your risk of infections and slow down your recovery so you will need to focus on controlling your diabetes during the weeks before surgery.  Make sure that the doctor who takes care of your diabetes knows about your planned surgery  including the date and location.  How do I manage my blood sugars before surgery?   Check your blood sugar at least 4 times a day, 2 days before surgery to make sure that they are not too high or low.   Check your blood sugar the morning of your surgery when you wake up and every 2 hours until you get to the Short-Stay unit.  If your blood sugar is less than 70 mg/dL, you will need to treat for low blood sugar by:  Treat a low blood sugar (less than 70 mg/dL) with 1/2 cup of clear juice (cranberry or apple), 4 glucose tablets, OR glucose gel.  Recheck blood sugar in 15 minutes after treatment (to make sure it is greater than 70 mg/dL).  If blood sugar is not greater than 70 mg/dL on re-check, call [phone] for further instructions.   Report your blood sugar to the Short-Stay nurse when you get to Short-Stay.  References:  [facility], 2007 "How to Manage your Diabetes Before and After Surgery".   Do not wear jewelry, make-up or nail polish.  Do not wear lotions, powders, or perfumes.     Men may shave face and neck.  Do not bring valuables to the hospital.  [facility] is not responsible for any belongings or valuables.  Contacts, dentures or bridgework may not be worn into surgery.  Leave your suitcase in the car.  After surgery it may be brought to your room.  For patients admitted to the hospital, discharge time will be determined by your treatment team.  Special instructions: Review  [place] - Preparing For Surgery.  Please read over the following fact sheets that you were given. Pain Booklet, Coughing and Deep Breathing, Blood Transfusion Information, MRSA Information and Surgical Site Infection Prevention

## 2015-04-08 ENCOUNTER — Encounter (HOSPITAL_COMMUNITY)
Admission: RE | Admit: 2015-04-08 | Discharge: 2015-04-08 | Disposition: A | Discharge status: Home / Self Care | Payer: Medicare Other | Source: Ambulatory Visit | Attending: Neurological Surgery | Admitting: Neurological Surgery

## 2015-04-08 ENCOUNTER — Other Ambulatory Visit: Payer: Self-pay | Admitting: Neurological Surgery

## 2015-04-08 ENCOUNTER — Encounter (HOSPITAL_COMMUNITY): Payer: Self-pay

## 2015-04-08 DIAGNOSIS — Z01818 Encounter for other preprocedural examination: Secondary | ICD-10-CM | POA: Diagnosis not present

## 2015-04-08 DIAGNOSIS — Z01812 Encounter for preprocedural laboratory examination: Secondary | ICD-10-CM | POA: Insufficient documentation

## 2015-04-08 DIAGNOSIS — Z0183 Encounter for blood typing: Secondary | ICD-10-CM | POA: Diagnosis not present

## 2015-04-08 DIAGNOSIS — M5030 Other cervical disc degeneration, unspecified cervical region: Secondary | ICD-10-CM | POA: Diagnosis not present

## 2015-04-08 DIAGNOSIS — J449 Chronic obstructive pulmonary disease, unspecified: Secondary | ICD-10-CM | POA: Diagnosis not present

## 2015-04-08 HISTORY — DX: Presence of spectacles and contact lenses: Z97.3

## 2015-04-08 HISTORY — DX: Nocturia: R35.1

## 2015-04-08 HISTORY — DX: Unspecified hearing loss, unspecified ear: H91.90

## 2015-04-08 HISTORY — DX: Frequency of micturition: R35.0

## 2015-04-08 LAB — CBC WITH DIFFERENTIAL/PLATELET
Basophils Absolute: 0.1 10*3/uL (ref 0.0–0.1)
Basophils Relative: 1 %
Eosinophils Absolute: 0.4 10*3/uL (ref 0.0–0.7)
Eosinophils Relative: 6 %
HCT: 41.2 % (ref 39.0–52.0)
Hemoglobin: 14.2 g/dL (ref 13.0–17.0)
Lymphocytes Relative: 21 %
Lymphs Abs: 1.3 10*3/uL (ref 0.7–4.0)
MCH: 33.9 pg (ref 26.0–34.0)
MCHC: 34.5 g/dL (ref 30.0–36.0)
MCV: 98.3 fL (ref 78.0–100.0)
Monocytes Absolute: 0.6 10*3/uL (ref 0.1–1.0)
Monocytes Relative: 9 %
Neutro Abs: 3.8 10*3/uL (ref 1.7–7.7)
Neutrophils Relative %: 63 %
Platelets: 181 10*3/uL (ref 150–400)
RBC: 4.19 MIL/uL — ABNORMAL LOW (ref 4.22–5.81)
RDW: 13.3 % (ref 11.5–15.5)
WBC: 6.1 10*3/uL (ref 4.0–10.5)

## 2015-04-08 LAB — BASIC METABOLIC PANEL
Anion gap: 9 (ref 5–15)
BUN: 18 mg/dL (ref 6–20)
CO2: 23 mmol/L (ref 22–32)
Calcium: 9.2 mg/dL (ref 8.9–10.3)
Chloride: 109 mmol/L (ref 101–111)
Creatinine, Ser: 0.93 mg/dL (ref 0.61–1.24)
GFR calc Af Amer: >60 mL/min (ref >60)
GFR calc non Af Amer: >60 mL/min (ref >60)
Glucose, Bld: 193 mg/dL — ABNORMAL HIGH (ref 65–99)
Potassium: 4.2 mmol/L (ref 3.5–5.1)
Sodium: 141 mmol/L (ref 135–145)

## 2015-04-08 LAB — PROTIME-INR
INR: 0.97 (ref 0.00–1.49)
Prothrombin Time: 13.1 seconds (ref 11.6–15.2)

## 2015-04-08 LAB — SURGICAL PCR SCREEN
MRSA, PCR: NEGATIVE
Staphylococcus aureus: NEGATIVE

## 2015-04-08 LAB — GLUCOSE, CAPILLARY: Glucose-Capillary: 177 mg/dL — ABNORMAL HIGH (ref 65–99)

## 2015-04-08 NOTE — Progress Notes (Signed)
PCP - Dr. Pearson Grippe Cardiologist - denies  EKG - 04/08/15 CXR - 04/08/15  Echo/stress test - pt. States it has been more than 15 years ago Cardiac cath - denies  Patient denies chest pain and shortness of breath at PAT appointment.   Patient states that he had a sleep study at least 15 years ago and that he wears a CPAP but unsure of settings.

## 2015-04-09 HISTORY — PX: POSTERIOR LUMBAR FUSION: SHX6036

## 2015-04-09 LAB — HEMOGLOBIN A1C
Hgb A1c MFr Bld: 6.7 % — ABNORMAL HIGH (ref 4.8–5.6)
Mean Plasma Glucose: 146 mg/dL

## 2015-04-09 NOTE — Progress Notes (Addendum)
Will need T & S redrawn per the lab ("specimen will expire").  May use same blood band number.

## 2015-04-10 NOTE — Progress Notes (Signed)
Pt has antibodies is the reason for the redrawn, not that specimen had 'expired' as stated by lab personnel, Tiffany.

## 2015-04-15 MED ORDER — DEXAMETHASONE SODIUM PHOSPHATE 10 MG/ML IJ SOLN
10.0000 mg | INTRAMUSCULAR | Status: AC
  Administered 2015-04-16: 10 mg via INTRAVENOUS
  Filled 2015-04-15: qty 1

## 2015-04-15 MED ORDER — CEFAZOLIN SODIUM-DEXTROSE 2-3 GM-% IV SOLR
2.0000 g | INTRAVENOUS | Status: AC
  Administered 2015-04-16: 2 g via INTRAVENOUS
  Filled 2015-04-15: qty 50

## 2015-04-16 ENCOUNTER — Encounter (HOSPITAL_COMMUNITY): Payer: Self-pay | Admitting: Neurological Surgery

## 2015-04-16 ENCOUNTER — Encounter (HOSPITAL_COMMUNITY)
Admission: RE | Disposition: A | Discharge status: Home / Self Care | Payer: Self-pay | Source: Ambulatory Visit | Attending: Neurological Surgery

## 2015-04-16 ENCOUNTER — Inpatient Hospital Stay (HOSPITAL_COMMUNITY): Payer: Medicare Other | Admitting: Certified Registered"

## 2015-04-16 ENCOUNTER — Inpatient Hospital Stay (HOSPITAL_COMMUNITY): Payer: Medicare Other

## 2015-04-16 ENCOUNTER — Ambulatory Visit (HOSPITAL_COMMUNITY)
Admission: RE | Admit: 2015-04-16 | Discharge: 2015-04-17 | Disposition: A | Discharge status: Home / Self Care | Payer: Medicare Other | Source: Ambulatory Visit | Attending: Neurological Surgery | Admitting: Neurological Surgery

## 2015-04-16 DIAGNOSIS — M47816 Spondylosis without myelopathy or radiculopathy, lumbar region: Principal | ICD-10-CM | POA: Insufficient documentation

## 2015-04-16 DIAGNOSIS — Z87891 Personal history of nicotine dependence: Secondary | ICD-10-CM | POA: Insufficient documentation

## 2015-04-16 DIAGNOSIS — Z7984 Long term (current) use of oral hypoglycemic drugs: Secondary | ICD-10-CM | POA: Diagnosis not present

## 2015-04-16 DIAGNOSIS — Z96651 Presence of right artificial knee joint: Secondary | ICD-10-CM | POA: Diagnosis not present

## 2015-04-16 DIAGNOSIS — Z79899 Other long term (current) drug therapy: Secondary | ICD-10-CM | POA: Insufficient documentation

## 2015-04-16 DIAGNOSIS — G4730 Sleep apnea, unspecified: Secondary | ICD-10-CM | POA: Insufficient documentation

## 2015-04-16 DIAGNOSIS — Z981 Arthrodesis status: Secondary | ICD-10-CM

## 2015-04-16 DIAGNOSIS — K219 Gastro-esophageal reflux disease without esophagitis: Secondary | ICD-10-CM | POA: Insufficient documentation

## 2015-04-16 DIAGNOSIS — Z791 Long term (current) use of non-steroidal anti-inflammatories (NSAID): Secondary | ICD-10-CM | POA: Insufficient documentation

## 2015-04-16 DIAGNOSIS — Z7982 Long term (current) use of aspirin: Secondary | ICD-10-CM | POA: Diagnosis not present

## 2015-04-16 DIAGNOSIS — M5136 Other intervertebral disc degeneration, lumbar region: Secondary | ICD-10-CM | POA: Diagnosis not present

## 2015-04-16 DIAGNOSIS — E119 Type 2 diabetes mellitus without complications: Secondary | ICD-10-CM | POA: Insufficient documentation

## 2015-04-16 DIAGNOSIS — N400 Enlarged prostate without lower urinary tract symptoms: Secondary | ICD-10-CM | POA: Insufficient documentation

## 2015-04-16 DIAGNOSIS — E785 Hyperlipidemia, unspecified: Secondary | ICD-10-CM | POA: Insufficient documentation

## 2015-04-16 DIAGNOSIS — Z419 Encounter for procedure for purposes other than remedying health state, unspecified: Secondary | ICD-10-CM

## 2015-04-16 LAB — TYPE AND SCREEN
ABO/RH(D): A POS
Antibody Screen: POSITIVE

## 2015-04-16 LAB — GLUCOSE, CAPILLARY
Glucose-Capillary: 142 mg/dL — ABNORMAL HIGH (ref 65–99)
Glucose-Capillary: 181 mg/dL — ABNORMAL HIGH (ref 65–99)
Glucose-Capillary: 137 mg/dL — ABNORMAL HIGH (ref 65–99)

## 2015-04-16 SURGERY — POSTERIOR LUMBAR FUSION 1 WITH HARDWARE REMOVAL
Anesthesia: General | Site: Spine Lumbar

## 2015-04-16 MED ORDER — ACETAMINOPHEN 325 MG PO TABS: 650.0000 mg | ORAL_TABLET | ORAL | Status: DC | PRN

## 2015-04-16 MED ORDER — ASPIRIN 81 MG PO CHEW
81.0000 mg | CHEWABLE_TABLET | Freq: Every day | ORAL | Status: DC
  Administered 2015-04-17: 81 mg via ORAL
  Filled 2015-04-16 (×2): qty 1

## 2015-04-16 MED ORDER — LACTATED RINGERS IV SOLN
INTRAVENOUS | Status: DC | PRN
  Administered 2015-04-16 (×2): via INTRAVENOUS

## 2015-04-16 MED ORDER — VANCOMYCIN HCL 1000 MG IV SOLR
INTRAVENOUS | Status: AC
  Filled 2015-04-16: qty 1000

## 2015-04-16 MED ORDER — VANCOMYCIN HCL POWD
Status: DC | PRN
  Administered 2015-04-16: 1000 mg via SURGICAL_CAVITY

## 2015-04-16 MED ORDER — ACETAMINOPHEN 650 MG RE SUPP: 650.0000 mg | RECTAL | Status: DC | PRN

## 2015-04-16 MED ORDER — PHENOL 1.4 % MT LIQD: 1.0000 | OROMUCOSAL | Status: DC | PRN

## 2015-04-16 MED ORDER — SODIUM CHLORIDE 0.9% FLUSH
3.0000 mL | Freq: Two times a day (BID) | INTRAVENOUS | Status: DC
  Administered 2015-04-16 (×2): 3 mL via INTRAVENOUS

## 2015-04-16 MED ORDER — SUCCINYLCHOLINE CHLORIDE 20 MG/ML IJ SOLN
INTRAMUSCULAR | Status: AC
  Filled 2015-04-16: qty 1

## 2015-04-16 MED ORDER — THROMBIN 20000 UNITS EX SOLR
CUTANEOUS | Status: DC | PRN
  Administered 2015-04-16: 09:00:00 via TOPICAL

## 2015-04-16 MED ORDER — PROPOFOL 10 MG/ML IV BOLUS
INTRAVENOUS | Status: DC | PRN
  Administered 2015-04-16: 150 mg via INTRAVENOUS
  Administered 2015-04-16 (×2): 50 mg via INTRAVENOUS

## 2015-04-16 MED ORDER — LIDOCAINE HCL (CARDIAC) 20 MG/ML IV SOLN
INTRAVENOUS | Status: DC | PRN
  Administered 2015-04-16: 60 mg via INTRAVENOUS

## 2015-04-16 MED ORDER — HYDROMORPHONE HCL 1 MG/ML IJ SOLN
INTRAMUSCULAR | Status: AC
  Filled 2015-04-16: qty 1

## 2015-04-16 MED ORDER — ADULT MULTIVITAMIN W/MINERALS CH
1.0000 | ORAL_TABLET | Freq: Every day | ORAL | Status: DC
  Administered 2015-04-17: 1 via ORAL
  Filled 2015-04-16: qty 1

## 2015-04-16 MED ORDER — BUPIVACAINE HCL (PF) 0.25 % IJ SOLN
INTRAMUSCULAR | Status: DC | PRN
  Administered 2015-04-16: 5 mL

## 2015-04-16 MED ORDER — METHOCARBAMOL 500 MG PO TABS
ORAL_TABLET | ORAL | Status: AC
  Filled 2015-04-16: qty 1

## 2015-04-16 MED ORDER — EPHEDRINE SULFATE 50 MG/ML IJ SOLN
INTRAMUSCULAR | Status: AC
  Filled 2015-04-16: qty 1

## 2015-04-16 MED ORDER — SODIUM CHLORIDE 0.9% FLUSH: 3.0000 mL | INTRAVENOUS | Status: DC | PRN

## 2015-04-16 MED ORDER — MEPERIDINE HCL 25 MG/ML IJ SOLN: 6.2500 mg | INTRAMUSCULAR | Status: DC | PRN

## 2015-04-16 MED ORDER — MORPHINE SULFATE (PF) 2 MG/ML IV SOLN
1.0000 mg | INTRAVENOUS | Status: DC | PRN
  Administered 2015-04-16: 4 mg via INTRAVENOUS
  Filled 2015-04-16: qty 1

## 2015-04-16 MED ORDER — DOXYCYCLINE HYCLATE 100 MG PO TABS
100.0000 mg | ORAL_TABLET | Freq: Two times a day (BID) | ORAL | Status: DC
Start: 2015-04-17 — End: 2015-04-17

## 2015-04-16 MED ORDER — MENTHOL 3 MG MT LOZG: 1.0000 | LOZENGE | OROMUCOSAL | Status: DC | PRN

## 2015-04-16 MED ORDER — FENTANYL CITRATE (PF) 250 MCG/5ML IJ SOLN
INTRAMUSCULAR | Status: DC | PRN
  Administered 2015-04-16 (×2): 100 ug via INTRAVENOUS
  Administered 2015-04-16: 50 ug via INTRAVENOUS

## 2015-04-16 MED ORDER — SODIUM CHLORIDE 0.9 % IR SOLN
Status: DC | PRN
  Administered 2015-04-16: 09:00:00

## 2015-04-16 MED ORDER — PIOGLITAZONE HCL 15 MG PO TABS
15.0000 mg | ORAL_TABLET | Freq: Two times a day (BID) | ORAL | Status: DC
  Administered 2015-04-16 – 2015-04-17 (×2): 15 mg via ORAL
  Filled 2015-04-16 (×2): qty 1

## 2015-04-16 MED ORDER — PHENYLEPHRINE 40 MCG/ML (10ML) SYRINGE FOR IV PUSH (FOR BLOOD PRESSURE SUPPORT)
PREFILLED_SYRINGE | INTRAVENOUS | Status: AC
Start: 2015-04-16 — End: 2015-04-16
  Filled 2015-04-16: qty 10

## 2015-04-16 MED ORDER — ACETAMINOPHEN 10 MG/ML IV SOLN
INTRAVENOUS | Status: AC
  Filled 2015-04-16: qty 100

## 2015-04-16 MED ORDER — ONDANSETRON HCL 4 MG/2ML IJ SOLN: 4.0000 mg | INTRAMUSCULAR | Status: DC | PRN

## 2015-04-16 MED ORDER — THROMBIN 5000 UNITS EX SOLR
CUTANEOUS | Status: DC | PRN
  Administered 2015-04-16: 09:00:00 via TOPICAL

## 2015-04-16 MED ORDER — FINASTERIDE 5 MG PO TABS
5.0000 mg | ORAL_TABLET | Freq: Every day | ORAL | Status: DC
  Administered 2015-04-16 – 2015-04-17 (×2): 5 mg via ORAL
  Filled 2015-04-16 (×3): qty 1

## 2015-04-16 MED ORDER — POTASSIUM CHLORIDE IN NACL 20-0.9 MEQ/L-% IV SOLN
INTRAVENOUS | Status: DC
  Filled 2015-04-16 (×3): qty 1000

## 2015-04-16 MED ORDER — PROPOFOL 10 MG/ML IV BOLUS
INTRAVENOUS | Status: AC
  Filled 2015-04-16: qty 20

## 2015-04-16 MED ORDER — FENTANYL CITRATE (PF) 250 MCG/5ML IJ SOLN
INTRAMUSCULAR | Status: AC
Start: 2015-04-16 — End: 2015-04-16
  Filled 2015-04-16: qty 5

## 2015-04-16 MED ORDER — LIDOCAINE HCL (CARDIAC) 20 MG/ML IV SOLN
INTRAVENOUS | Status: AC
  Filled 2015-04-16: qty 5

## 2015-04-16 MED ORDER — LACTATED RINGERS IV SOLN: INTRAVENOUS | Status: DC

## 2015-04-16 MED ORDER — VITAMIN D (ERGOCALCIFEROL) 1.25 MG (50000 UNIT) PO CAPS: 2000.0000 [IU] | ORAL_CAPSULE | Freq: Every day | ORAL | Status: DC

## 2015-04-16 MED ORDER — 0.9 % SODIUM CHLORIDE (POUR BTL) OPTIME
TOPICAL | Status: DC | PRN
  Administered 2015-04-16: 1000 mL

## 2015-04-16 MED ORDER — METHOCARBAMOL 500 MG PO TABS
500.0000 mg | ORAL_TABLET | Freq: Four times a day (QID) | ORAL | Status: DC | PRN
  Administered 2015-04-16 – 2015-04-17 (×4): 500 mg via ORAL
  Filled 2015-04-16 (×3): qty 1

## 2015-04-16 MED ORDER — PIOGLITAZONE HCL-METFORMIN HCL 15-500 MG PO TABS: 1.0000 | ORAL_TABLET | Freq: Two times a day (BID) | ORAL | Status: DC

## 2015-04-16 MED ORDER — HYDROMORPHONE HCL 1 MG/ML IJ SOLN
0.2500 mg | INTRAMUSCULAR | Status: DC | PRN
  Administered 2015-04-16 (×5): 0.5 mg via INTRAVENOUS

## 2015-04-16 MED ORDER — SUCCINYLCHOLINE CHLORIDE 20 MG/ML IJ SOLN
INTRAMUSCULAR | Status: DC | PRN
  Administered 2015-04-16: 100 mg via INTRAVENOUS

## 2015-04-16 MED ORDER — MIDAZOLAM HCL 2 MG/2ML IJ SOLN
INTRAMUSCULAR | Status: AC
  Filled 2015-04-16: qty 2

## 2015-04-16 MED ORDER — CEFAZOLIN SODIUM 1-5 GM-% IV SOLN
1.0000 g | Freq: Three times a day (TID) | INTRAVENOUS | Status: AC
  Administered 2015-04-16 (×2): 1 g via INTRAVENOUS
  Filled 2015-04-16 (×2): qty 50

## 2015-04-16 MED ORDER — METHOCARBAMOL 1000 MG/10ML IJ SOLN
500.0000 mg | Freq: Four times a day (QID) | INTRAMUSCULAR | Status: DC | PRN
  Filled 2015-04-16: qty 5

## 2015-04-16 MED ORDER — ACETAMINOPHEN 10 MG/ML IV SOLN
1000.0000 mg | Freq: Once | INTRAVENOUS | Status: AC
  Administered 2015-04-16: 1000 mg via INTRAVENOUS

## 2015-04-16 MED ORDER — OXYCODONE-ACETAMINOPHEN 5-325 MG PO TABS
1.0000 | ORAL_TABLET | ORAL | Status: DC | PRN
  Administered 2015-04-16 – 2015-04-17 (×4): 2 via ORAL
  Filled 2015-04-16 (×4): qty 2

## 2015-04-16 MED ORDER — METFORMIN HCL 500 MG PO TABS
500.0000 mg | ORAL_TABLET | Freq: Two times a day (BID) | ORAL | Status: DC
  Administered 2015-04-16 – 2015-04-17 (×2): 500 mg via ORAL
  Filled 2015-04-16 (×2): qty 1

## 2015-04-16 MED ORDER — ONDANSETRON HCL 4 MG/2ML IJ SOLN
INTRAMUSCULAR | Status: DC | PRN
Start: 2015-04-16 — End: 2015-04-16
  Administered 2015-04-16: 4 mg via INTRAVENOUS

## 2015-04-16 MED ORDER — SODIUM CHLORIDE 0.9 % IJ SOLN
INTRAMUSCULAR | Status: AC
  Filled 2015-04-16: qty 10

## 2015-04-16 MED ORDER — MIDAZOLAM HCL 5 MG/5ML IJ SOLN
INTRAMUSCULAR | Status: DC | PRN
  Administered 2015-04-16: 2 mg via INTRAVENOUS

## 2015-04-16 MED ORDER — MORPHINE SULFATE (PF) 2 MG/ML IV SOLN
INTRAVENOUS | Status: AC
  Filled 2015-04-16: qty 1

## 2015-04-16 MED ORDER — ROCURONIUM BROMIDE 50 MG/5ML IV SOLN
INTRAVENOUS | Status: AC
Start: 2015-04-16 — End: 2015-04-16
  Filled 2015-04-16: qty 1

## 2015-04-16 MED ORDER — TAMSULOSIN HCL 0.4 MG PO CAPS
0.4000 mg | ORAL_CAPSULE | Freq: Every day | ORAL | Status: DC
  Administered 2015-04-16 – 2015-04-17 (×2): 0.4 mg via ORAL
  Filled 2015-04-16 (×2): qty 1

## 2015-04-16 MED ORDER — PROMETHAZINE HCL 25 MG/ML IJ SOLN: 6.2500 mg | INTRAMUSCULAR | Status: DC | PRN

## 2015-04-16 SURGICAL SUPPLY — 62 items
BAG DECANTER FOR FLEXI CONT (MISCELLANEOUS) ×3 IMPLANT
BENZOIN TINCTURE PRP APPL 2/3 (GAUZE/BANDAGES/DRESSINGS) ×3 IMPLANT
BIT DRILL PLIF MAS 5.0MM DISP (DRILL) ×1 IMPLANT
BLADE CLIPPER SURG (BLADE) IMPLANT
BONE CANC CHIPS 40CC CAN1/2 (Bone Implant) ×3 IMPLANT
BUR MATCHSTICK NEURO 3.0 LAGG (BURR) ×3 IMPLANT
CANISTER SUCT 3000ML PPV (MISCELLANEOUS) ×3 IMPLANT
CHIPS CANC BONE 40CC CAN1/2 (Bone Implant) ×1 IMPLANT
CLIP NEUROVISION LG (CLIP) ×3 IMPLANT
CLOSURE WOUND 1/2 X4 (GAUZE/BANDAGES/DRESSINGS) ×1
CONT SPEC 4OZ CLIKSEAL STRL BL (MISCELLANEOUS) ×3 IMPLANT
COVER BACK TABLE 60X90IN (DRAPES) ×3 IMPLANT
DRAPE C-ARM 42X72 X-RAY (DRAPES) ×6 IMPLANT
DRAPE C-ARMOR (DRAPES) ×3 IMPLANT
DRAPE LAPAROTOMY 100X72X124 (DRAPES) ×3 IMPLANT
DRAPE POUCH INSTRU U-SHP 10X18 (DRAPES) ×3 IMPLANT
DRAPE SURG 17X23 STRL (DRAPES) ×3 IMPLANT
DRILL PLIF MAS 5.0MM DISP (DRILL) ×3
DRSG OPSITE POSTOP 4X6 (GAUZE/BANDAGES/DRESSINGS) ×3 IMPLANT
DURAPREP 26ML APPLICATOR (WOUND CARE) ×3 IMPLANT
ELECT REM PT RETURN 9FT ADLT (ELECTROSURGICAL) ×3
ELECTRODE REM PT RTRN 9FT ADLT (ELECTROSURGICAL) ×1 IMPLANT
EVACUATOR 1/8 PVC DRAIN (DRAIN) IMPLANT
GAUZE SPONGE 4X4 16PLY XRAY LF (GAUZE/BANDAGES/DRESSINGS) IMPLANT
GLOVE BIO SURGEON STRL SZ8 (GLOVE) ×6 IMPLANT
GLOVE BIOGEL PI IND STRL 7.5 (GLOVE) ×1 IMPLANT
GLOVE BIOGEL PI IND STRL 8 (GLOVE) ×2 IMPLANT
GLOVE BIOGEL PI INDICATOR 7.5 (GLOVE) ×2
GLOVE BIOGEL PI INDICATOR 8 (GLOVE) ×4
GLOVE ECLIPSE 7.5 STRL STRAW (GLOVE) ×6 IMPLANT
GOWN STRL REUS W/ TWL LRG LVL3 (GOWN DISPOSABLE) IMPLANT
GOWN STRL REUS W/ TWL XL LVL3 (GOWN DISPOSABLE) ×2 IMPLANT
GOWN STRL REUS W/TWL 2XL LVL3 (GOWN DISPOSABLE) ×3 IMPLANT
GOWN STRL REUS W/TWL LRG LVL3 (GOWN DISPOSABLE)
GOWN STRL REUS W/TWL XL LVL3 (GOWN DISPOSABLE) ×4
HEMOSTAT POWDER KIT SURGIFOAM (HEMOSTASIS) ×3 IMPLANT
KIT BASIN OR (CUSTOM PROCEDURE TRAY) ×3 IMPLANT
KIT INFUSE XX SMALL 0.7CC (Orthopedic Implant) ×3 IMPLANT
KIT ROOM TURNOVER OR (KITS) ×3 IMPLANT
MILL MEDIUM DISP (BLADE) ×3 IMPLANT
MODULE NVM5 NEXT GEN EMG (NEEDLE) ×3 IMPLANT
NEEDLE HYPO 25X1 1.5 SAFETY (NEEDLE) ×3 IMPLANT
NS IRRIG 1000ML POUR BTL (IV SOLUTION) ×3 IMPLANT
PACK LAMINECTOMY NEURO (CUSTOM PROCEDURE TRAY) ×3 IMPLANT
PAD ARMBOARD 7.5X6 YLW CONV (MISCELLANEOUS) ×9 IMPLANT
ROD PREBENT 45MM LUMBAR (Rod) ×6 IMPLANT
SCREW LOCK (Screw) ×8 IMPLANT
SCREW LOCK FXNS SPNE MAS PL (Screw) ×4 IMPLANT
SCREW SHANK 5.0X35 (Screw) ×3 IMPLANT
SCREW SHANK 5.5X40MM (Screw) ×6 IMPLANT
SCREW TULIP 5.5 (Screw) ×9 IMPLANT
SPONGE LAP 4X18 X RAY DECT (DISPOSABLE) IMPLANT
SPONGE SURGIFOAM ABS GEL 100 (HEMOSTASIS) ×3 IMPLANT
STRIP CLOSURE SKIN 1/2X4 (GAUZE/BANDAGES/DRESSINGS) ×2 IMPLANT
SUT VIC AB 0 CT1 18XCR BRD8 (SUTURE) ×1 IMPLANT
SUT VIC AB 0 CT1 8-18 (SUTURE) ×2
SUT VIC AB 2-0 CP2 18 (SUTURE) ×3 IMPLANT
SUT VIC AB 3-0 SH 8-18 (SUTURE) ×3 IMPLANT
TOWEL OR 17X24 6PK STRL BLUE (TOWEL DISPOSABLE) ×3 IMPLANT
TOWEL OR 17X26 10 PK STRL BLUE (TOWEL DISPOSABLE) ×3 IMPLANT
TRAP SPECIMEN MUCOUS 40CC (MISCELLANEOUS) ×3 IMPLANT
WATER STERILE IRR 1000ML POUR (IV SOLUTION) ×3 IMPLANT

## 2015-04-16 NOTE — Op Note (Signed)
04/16/2015  10:02 AM  PATIENT:  [name]  75 y.o. 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 Posterior fixation L2-3 using nuvasive cortical pedicle screws.  2. Intertransverse arthrodesis L2-3 using morcellized allograft. 3. Removal of instrumentation L3-4 left  SURGEON:  Marikay Alaravid Dru Primeau, MD  ASSISTANTS: Dr. Jule SerNudleman  ANESTHESIA:  General  EBL: 50 ml  Total I/O In: 1300 [I.V.:1300] Out: 50 [Blood:50]  BLOOD ADMINISTERED:none  DRAINS: none  INDICATION FOR PROCEDURE: This patient presented with severe back pain. He has CT scan and MRI which showed severe degenerative disc disease and spondylosis at L2-3 above previous L3-4 fusion. He tried medical management without relief. I recommended extending his fusion up to L2-3. Patient understood the risks, benefits, and alternatives and potential outcomes and wished to proceed.  PROCEDURE DETAILS:  The patient was brought to the operating room. After induction of generalized endotracheal anesthesia the patient was rolled into the prone position on chest rolls and all pressure points were padded. The patient's lumbar region was cleaned and then prepped with DuraPrep and draped in the usual sterile fashion. Anesthesia was injected and then a dorsal midline incision was made and carried down to the lumbosacral fascia. The fascia was opened and the paraspinous musculature was taken down in a subperiosteal fashion to expose L2-3 as well as the previously placed hardware at L3-4 on the left. A self-retaining retractor was placed. I removed the locking caps from the tulip heads and then remove the rod. I then removed the L4 pedicle screw on the left. The L3 pedicle screw had excellent purchase. Intraoperative fluoroscopy confirmed my level, and I started with placement of the L2 cortical pedicle screws. The pedicle screw entry zones were  identified utilizing surface landmarks and  AP and lateral fluoroscopy. I scored the cortex with the high-speed drill and then used the hand drill and EMG monitoring to drill an upward and outward direction into the pedicle. I then tapped line to line, and the tap was also monitored. I then placed a 5-0 x 35 mm cortical pedicle screw into the pedicles of L2 bilaterally. We then turned our attention to the placement of the lower pedicle screw. The pedicle screw entry zones were identified utilizing surface landmarks and fluoroscopy. I drilled into each pedicle utilizing the hand drill and EMG monitoring, and tapped each pedicle with the appropriate tap. We palpated with a ball probe to assure no break in the cortex. We then placed 5-0 x 35 mm cortical pedicle screw into the pedicles at L3 on the right. We then decorticated the transverse processes and laid a mixture of morcellized allograft out over these to perform intertransverse arthrodesis at L2-3 bilaterally. We then placed lordotic rods into the multiaxial screw heads of the pedicle screws and locked these in position with the locking caps and anti-torque device. We then checked our construct with AP and lateral fluoroscopy. Irrigated with copious amounts of bacitracin-containing saline solution and closed the muscle and the fascia with 0 Vicryl. Closed the subcutaneous tissues with 2-0 Vicryl and subcuticular tissues with 3-0 Vicryl. The skin was closed with benzoin and Steri-Strips. Dressing was then applied, the patient was awakened from general anesthesia and transported to the recovery room in stable condition. At the end of the procedure all sponge, needle and instrument counts were correct.   PLAN OF CARE: Admit for overnight observation  PATIENT DISPOSITION:  PACU -  hemodynamically stable.   Delay start of Pharmacological VTE agent (>24hrs) due to surgical blood loss or risk of bleeding:  yes

## 2015-04-16 NOTE — Anesthesia Preprocedure Evaluation (Addendum)
Anesthesia Evaluation  Patient identified by MRN, date of birth, ID band Patient awake    Reviewed: Allergy & Precautions, NPO status , Patient's Chart, lab work & pertinent test results  Airway Mallampati: III  TM Distance: >3 FB Neck ROM: Full    Dental  (+) Teeth Intact   Pulmonary sleep apnea and Continuous Positive Airway Pressure Ventilation , former smoker,    breath sounds clear to auscultation       Cardiovascular negative cardio ROS   Rhythm:Regular Rate:Normal     Neuro/Psych negative neurological ROS  negative psych ROS   GI/Hepatic Neg liver ROS, GERD  Medicated,  Endo/Other  diabetes, Type 2, Oral Hypoglycemic Agents  Renal/GU negative Renal ROS  negative genitourinary   Musculoskeletal  (+) Arthritis , Osteoarthritis,    Abdominal   Peds  Hematology negative hematology ROS (+)   Anesthesia Other Findings - HLD  Reproductive/Obstetrics                            Lab Results  Component Value Date   WBC 6.1 04/08/2015   HGB 14.2 04/08/2015   HCT 41.2 04/08/2015   MCV 98.3 04/08/2015   PLT 181 04/08/2015   Lab Results  Component Value Date   CREATININE 0.93 04/08/2015   BUN 18 04/08/2015   NA 141 04/08/2015   K 4.2 04/08/2015   CL 109 04/08/2015   CO2 23 04/08/2015   Lab Results  Component Value Date   INR 0.97 04/08/2015   INR 0.86 03/09/2012   INR 0.98 11/26/2011   03/2015 EKG: normal sinus rhythm.    Anesthesia Physical Anesthesia Plan  ASA: III  Anesthesia Plan: General   Post-op Pain Management:    Induction: Intravenous  Airway Management Planned: Oral ETT  Additional Equipment:   Intra-op Plan:   Post-operative Plan: Extubation in OR  Informed Consent: I have reviewed the patients History and Physical, chart, labs and discussed the procedure including the risks, benefits and alternatives for the proposed anesthesia with the patient or  authorized representative who has indicated his/her understanding and acceptance.   Dental advisory given  Plan Discussed with: CRNA  Anesthesia Plan Comments:         Anesthesia Quick Evaluation

## 2015-04-16 NOTE — Anesthesia Procedure Notes (Signed)
Procedure Name: Intubation Date/Time: 04/16/2015 8:31 AM Performed by: Jerilee HohMUMM, Odelle Kosier N Pre-anesthesia Checklist: Patient identified, Emergency Drugs available, Suction available and Patient being monitored Patient Re-evaluated:Patient Re-evaluated prior to inductionOxygen Delivery Method: Circle system utilized Preoxygenation: Pre-oxygenation with 100% oxygen Intubation Type: IV induction Ventilation: Mask ventilation without difficulty Laryngoscope Size: Mac and 4 Grade View: Grade II Tube type: Oral Tube size: 7.5 mm Number of attempts: 1 Airway Equipment and Method: Stylet Placement Confirmation: ETT inserted through vocal cords under direct vision,  positive ETCO2 and breath sounds checked- equal and bilateral Secured at: 22 cm Tube secured with: Tape Dental Injury: Teeth and Oropharynx as per pre-operative assessment  Comments: Anectine used d/t neuro monitoring. Grade II view with cricoid pressure.

## 2015-04-16 NOTE — H&P (Signed)
Subjective: Patient is a 75 y.o. male admitted for L2-3 fusion. Onset of symptoms was several months ago, gradually worsening since that time.  The pain is rated severe, and is located at the across the lower back. The pain is described as aching and occurs all day. The symptoms have been progressive. Symptoms are exacerbated by exercise. 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 Hyperlipidemia     takes Lipitor and Fish Oil daily  . Sleep apnea     uses CPAP  . Pneumonia     after surgery in 2012  . Arthritis   . Joint pain   . Joint swelling   . Chronic back pain     DDD  . Dysphagia     EGD with dilitation  . GERD (gastroesophageal reflux disease)     takes Omeprazole daily  . Multiple duodenal ulcers   . Enlarged prostate     sees a urologist  . Diabetes mellitus     takes Actos-met  . Insomnia     d/t pain  . Constipation   . Barrett's esophagus with dysplasia   . History of bronchitis   . Wears glasses   . Nocturia   . Urinary frequency   . Hard of hearing     Past Surgical History  Procedure Laterality Date  . Rotator cuff repair  59yr ago    bilateral  . Cervical fusion  2012  . Joint replacement  2011    right knee replacement  . Cholecystectomy  12+yrs ago  . Finger surgery      right pointer  . Elbow surgery      right   . Colonoscopy    . Esophagogastroduodenoscopy    . Anterior lat lumbar fusion  12/02/2011    Procedure: ANTERIOR LATERAL LUMBAR FUSION 1 LEVEL;  Surgeon: DEustace Moore MD;  [facility];  Service: Neurosurgery;  Laterality: Left;  left lumbar three-four  . Lumbar fusion  03/16/2012    Dr JRonnald Ramp . Eye surgery Right     Prior to Admission medications   Medication Sig Start Date End Date Taking? Authorizing Provider  aspirin 81 MG chewable tablet Chew 81 mg by mouth daily.   Yes Historical Provider, MD  atorvastatin (LIPITOR) 80 MG tablet Take 40-80 mg by mouth daily. 80 mg on Monday, Wednesday  and Friday , and 40 mg all other days.   Yes Historical Provider, MD  Biotin 5000 MCG CAPS Take 5,000 mcg by mouth daily.   Yes Historical Provider, MD  CYANOCOBALAMIN PO Take 1,000 Units by mouth daily.    Yes Historical Provider, MD  docusate sodium (COLACE) 100 MG capsule Take 200 mg by mouth daily.   Yes Historical Provider, MD  doxycycline (VIBRA-TABS) 100 MG tablet Take 100 mg by mouth 2 (two) times daily. 03/18/15  Yes Historical Provider, MD  finasteride (PROSCAR) 5 MG tablet Take 5 mg by mouth daily.   Yes Historical Provider, MD  glucosamine-chondroitin 500-400 MG tablet Take 1 tablet by mouth daily.   Yes Historical Provider, MD  Methylcellulose, Laxative, (CITRUCEL PO) Take 2 capsules by mouth daily.   Yes Historical Provider, MD  Multiple Vitamin (MULTIVITAMIN WITH MINERALS) TABS Take 1 tablet by mouth daily.   Yes Historical Provider, MD  naproxen sodium (ANAPROX) 220 MG tablet Take 440 mg by mouth 2 (two) times daily with a meal.   Yes Historical Provider, MD  Omega-3  Fatty Acids (FISH OIL) 1200 MG CAPS Take 1,200 mg by mouth daily.    Yes Historical Provider, MD  omeprazole (PRILOSEC) 20 MG capsule Take 20 mg by mouth daily.   Yes Historical Provider, MD  oxyCODONE-acetaminophen (PERCOCET/ROXICET) 5-325 MG tablet Take 1 tablet by mouth daily as needed for moderate pain.  03/21/15  Yes Historical Provider, MD  pioglitazone-metformin (ACTOPLUS MET) 15-500 MG per tablet Take 1 tablet by mouth 2 (two) times daily with a meal.   Yes Historical Provider, MD  Saw Palmetto, Serenoa repens, (SAW PALMETTO PO) Take 1 capsule by mouth daily.   Yes Historical Provider, MD  tamsulosin (FLOMAX) 0.4 MG CAPS capsule Take 0.4 mg by mouth daily. 03/18/15  Yes Historical Provider, MD  VITAMIN D, ERGOCALCIFEROL, PO Take 2,000 Units by mouth daily.    Yes Historical Provider, MD  methocarbamol (ROBAXIN) 500 MG tablet Take 1 tablet (500 mg total) by mouth every 6 (six) hours as needed. Patient not taking:  Reported on 04/07/2015 03/17/12   Eustace Moore, MD  oxyCODONE 10 MG TABS Take 1 tablet (10 mg total) by mouth every 4 (four) hours as needed. Patient not taking: Reported on 04/07/2015 03/17/12   Eustace Moore, MD   No Known Allergies  Social History  Substance Use Topics  . Smoking status: Former Smoker -- 1.50 packs/day for 30 years    Types: Cigarettes    Quit date: 03/16/1985  . Smokeless tobacco: Never Used     Comment: quit 25+yrs ago  . Alcohol Use: Yes     Comment: rarely    History reviewed. No pertinent family history.   Review of Systems  Positive ROS: neg  All other systems have been reviewed and were otherwise negative with the exception of those mentioned in the HPI and as above.  Objective: Vital signs in last 24 hours:    General Appearance: Alert, cooperative, no distress, appears stated age Head: Normocephalic, without obvious abnormality, atraumatic Eyes: PERRL, conjunctiva/corneas clear, EOM's intact    Neck: Supple, symmetrical, trachea midline Back: Symmetric, no curvature, ROM normal, no CVA tenderness Lungs:  respirations unlabored Heart: Regular rate and rhythm Abdomen: Soft, non-tender Extremities: Extremities normal, atraumatic, no cyanosis or edema Pulses: 2+ and symmetric all extremities Skin: Skin color, texture, turgor normal, no rashes or lesions  NEUROLOGIC:   Mental status: Alert and oriented x4,  no aphasia, good attention span, fund of knowledge, and memory Motor Exam - grossly normal Sensory Exam - grossly normal Reflexes: 1+ Coordination - grossly normal Gait - grossly normal Balance - grossly normal Cranial Nerves: I: smell Not tested  II: visual acuity  OS: nl    OD: nl  II: visual fields Full to confrontation  II: pupils Equal, round, reactive to light  III,VII: ptosis None  III,IV,VI: extraocular muscles  Full ROM  V: mastication Normal  V: facial light touch sensation  Normal  V,VII: corneal reflex  Present  VII: facial  muscle function - upper  Normal  VII: facial muscle function - lower Normal  VIII: hearing Not tested  IX: soft palate elevation  Normal  IX,X: gag reflex Present  XI: trapezius strength  5/5  XI: sternocleidomastoid strength 5/5  XI: neck flexion strength  5/5  XII: tongue strength  Normal    Data Review Lab Results  Component Value Date   WBC 6.1 04/08/2015   HGB 14.2 04/08/2015   HCT 41.2 04/08/2015   MCV 98.3 04/08/2015   PLT 181 04/08/2015  Lab Results  Component Value Date   NA 141 04/08/2015   K 4.2 04/08/2015   CL 109 04/08/2015   CO2 23 04/08/2015   BUN 18 04/08/2015   CREATININE 0.93 04/08/2015   GLUCOSE 193* 04/08/2015   Lab Results  Component Value Date   INR 0.97 04/08/2015    Assessment/Plan: Patient admitted for L2-3 fusion. Patient has failed a reasonable attempt at conservative therapy.  I explained the condition and procedure to the patient and answered any questions.  Patient wishes to proceed with procedure as planned. Understands risks/ benefits and typical outcomes of procedure.   JONES,DAVID S 04/16/2015 7:09 AM

## 2015-04-16 NOTE — Anesthesia Postprocedure Evaluation (Signed)
Anesthesia Post Note  Patient: [name] MelenaGary P Schwier  Procedure(s) Performed: Procedure(s) (LRB): Posterior Lateral Fusion - Lumbar Two-Lumbar Three with cortical pedicle screws, Removal of hardware Lumbr Three-Lumbar Four (N/A)  Patient location during evaluation: PACU Anesthesia Type: General Level of consciousness: awake and alert Pain management: pain level controlled Vital Signs Assessment: post-procedure vital signs reviewed and stable Respiratory status: spontaneous breathing, nonlabored ventilation, respiratory function stable and patient connected to nasal cannula oxygen Cardiovascular status: blood pressure returned to baseline and stable Postop Assessment: no signs of nausea or vomiting Anesthetic complications: no    Last Vitals:  Filed Vitals:   04/16/15 1120 04/16/15 1142  BP:  139/73  Pulse: 81 80  Temp: 36.6 C 36.6 C  Resp: 15 16    Last Pain:  Filed Vitals:   04/16/15 1202  PainSc: 4                  Shelton SilvasKevin D Hollis

## 2015-04-16 NOTE — Evaluation (Signed)
Physical Therapy Evaluation Patient Details Name: [name] MelenaGary P Dyer MRN: [id number] DOB: 22-Dec-1940 Today's Date: 04/16/2015   History of Present Illness  Patient is a 75 y/o male with hx of HLD, DM, insomnia and sleep apnea s/p L2-3 PLIF.   Clinical Impression  Patient presents with mild back pain s/p surgery however tolerating ambulation and mobility with Mod I-supervision level of assist. Reviewed back precautions and how to donn/doff LSO brace. Discussed safety at home and incorporating back precautions into mobility and ADLs.Encouraged ambulation 2 more times tonight and tomorrow prior to d/c. Pt would eventually benefit from OPPT once cleared by neurosurgeon so pt can return to higher level activities such as golf. All education completed. Pt does not require further skilled therapy services. Discharge from therapy.     Follow Up Recommendations No PT follow up;Supervision - Intermittent    Equipment Recommendations  None recommended by PT    Recommendations for Other Services       Precautions / Restrictions Precautions Precautions: Back Precaution Booklet Issued: No Precaution Comments: Reviewed 3/3 back precautions. Required Braces or Orthoses: Spinal Brace Spinal Brace: Lumbar corset Restrictions Weight Bearing Restrictions: No      Mobility  Bed Mobility Overal bed mobility: Modified Independent;Needs Assistance Bed Mobility: Rolling;Sidelying to Sit;Sit to Sidelying Rolling: Modified independent (Device/Increase time) Sidelying to sit: Modified independent (Device/Increase time)     Sit to sidelying: Modified independent (Device/Increase time) General bed mobility comments: HOB flat, no use of rails for support to simulate home.  Transfers Overall transfer level: Modified independent Equipment used: None Transfers: Sit to/from Stand Sit to Stand: Modified independent (Device/Increase time)         General transfer comment: Stood from EOB x1 with cues to  adhere to back precautions.  Ambulation/Gait Ambulation/Gait assistance: Supervision;Modified independent (Device/Increase time) Ambulation Distance (Feet): 200 Feet Assistive device: None Gait Pattern/deviations: Step-through pattern;Decreased stride length;Wide base of support   Gait velocity interpretation: <1.8 ft/sec, indicative of risk for recurrent falls General Gait Details: Slow, steady gait. 2/4 DOE. Waddling like gait pattern with wide BoS. Reports "I am walking better then before surgery."  Stairs            Wheelchair Mobility    Modified Rankin (Stroke Patients Only)       Balance Overall balance assessment: Needs assistance Sitting-balance support: Feet supported;No upper extremity supported Sitting balance-Leahy Scale: Good Sitting balance - Comments: Able to donn/doff LSO with setup.   Standing balance support: During functional activity Standing balance-Leahy Scale: Fair                               Pertinent Vitals/Pain Pain Assessment: 0-10 Pain Score: 4  Pain Location: incision Pain Descriptors / Indicators: Sore Pain Intervention(s): Monitored during session;Repositioned;Premedicated before session    Home Living Family/patient expects to be discharged to:: Private residence Living Arrangements: Spouse/significant other Available Help at Discharge: Family;Available 24 hours/day Type of Home: House Home Access: Stairs to enter Entrance Stairs-Rails: None Entrance Stairs-Number of Steps: 1 Home Layout: One level Home Equipment: Walker - 2 wheels;Cane - single point;Crutches;Bedside commode      Prior Function Level of Independence: Independent         Comments: Print production plannerffice manager. Plays golf twice/week.     Hand Dominance   Dominant Hand: Right    Extremity/Trunk Assessment   Upper Extremity Assessment: Defer to OT evaluation  Lower Extremity Assessment: Overall WFL for tasks assessed          Communication   Communication: No difficulties  Cognition Arousal/Alertness: Awake/alert Behavior During Therapy: WFL for tasks assessed/performed Overall Cognitive Status: Within Functional Limits for tasks assessed                      General Comments General comments (skin integrity, edema, etc.): Wife and daughter present during session.    Exercises        Assessment/Plan    PT Assessment Patent does not need any further PT services  PT Diagnosis Acute pain   PT Problem List    PT Treatment Interventions     PT Goals (Current goals can be found in the Care Plan section) Acute Rehab PT Goals Patient Stated Goal: to return to golf PT Goal Formulation: With patient Time For Goal Achievement: 04/30/15 Potential to Achieve Goals: Good    Frequency     Barriers to discharge        Co-evaluation               End of Session Equipment Utilized During Treatment: Back brace Activity Tolerance: Patient tolerated treatment well Patient left: in bed;with call bell/phone within reach;with SCD's reapplied;with family/visitor present Nurse Communication: Mobility status    Functional Assessment Tool Used: clinical judgment Functional Limitation: Mobility: Walking and moving around Mobility: Walking and Moving Around Current Status (W0981): At least 1 percent but less than 20 percent impaired, limited or restricted Mobility: Walking and Moving Around Goal Status [phone]): At least 1 percent but less than 20 percent impaired, limited or restricted Mobility: Walking and Moving Around Discharge Status [phone]): At least 1 percent but less than 20 percent impaired, limited or restricted    Time: 1630-1650 PT Time Calculation (min) (ACUTE ONLY): 20 min   Charges:   PT Evaluation $PT Eval Moderate Complexity: 1 Procedure     PT G Codes:   PT G-Codes **NOT FOR INPATIENT CLASS** Functional Assessment Tool Used: clinical judgment Functional Limitation: Mobility:  Walking and moving around Mobility: Walking and Moving Around Current Status (O1308): At least 1 percent but less than 20 percent impaired, limited or restricted Mobility: Walking and Moving Around Goal Status [phone]): At least 1 percent but less than 20 percent impaired, limited or restricted Mobility: Walking and Moving Around Discharge Status [phone]): At least 1 percent but less than 20 percent impaired, limited or restricted    Jerrika Ledlow A Nessie Nong 04/16/2015, 4:58 PM Mylo Red, PT, DPT [phone]

## 2015-04-16 NOTE — Transfer of Care (Signed)
Immediate Anesthesia Transfer of Care Note  Patient: [name] MelenaGary P Dyer  Procedure(s) Performed: Procedure(s): Posterior Lateral Fusion - Lumbar Two-Lumbar Three with cortical pedicle screws, Removal of hardware Lumbr Three-Lumbar Four (N/A)  Patient Location: PACU  Anesthesia Type:General  Level of Consciousness: awake, alert , oriented and patient cooperative  Airway & Oxygen Therapy: Patient Spontanous Breathing and Patient connected to nasal cannula oxygen  Post-op Assessment: Report given to RN, Post -op Vital signs reviewed and stable and Patient moving all extremities  Post vital signs: Reviewed and stable  Last Vitals:  Filed Vitals:   04/16/15 0722  BP: 122/65  Pulse: 64  Temp: 36.3 C  Resp: 16    Complications: No apparent anesthesia complications

## 2015-04-17 DIAGNOSIS — M47816 Spondylosis without myelopathy or radiculopathy, lumbar region: Secondary | ICD-10-CM | POA: Diagnosis not present

## 2015-04-17 LAB — GLUCOSE, CAPILLARY
Glucose-Capillary: 136 mg/dL — ABNORMAL HIGH (ref 65–99)
Glucose-Capillary: 108 mg/dL — ABNORMAL HIGH (ref 65–99)

## 2015-04-17 MED ORDER — METHOCARBAMOL 500 MG PO TABS: 500.0000 mg | ORAL_TABLET | Freq: Four times a day (QID) | ORAL | Status: DC | PRN

## 2015-04-17 MED ORDER — DIPHENHYDRAMINE HCL 25 MG PO CAPS
25.0000 mg | ORAL_CAPSULE | Freq: Four times a day (QID) | ORAL | Status: DC | PRN
Start: 2015-04-17 — End: 2015-04-17
  Filled 2015-04-17: qty 1

## 2015-04-17 MED ORDER — OXYCODONE-ACETAMINOPHEN 5-325 MG PO TABS: 1.0000 | ORAL_TABLET | ORAL | Status: DC | PRN

## 2015-04-17 MED FILL — Vancomycin HCl For IV Soln 1 GM (Base Equivalent): INTRAVENOUS | Qty: 1000 | Status: AC

## 2015-04-17 NOTE — Discharge Summary (Signed)
Physician Discharge Summary  Patient ID: [name] MRN: [id number] DOB/AGE: 04/03/1940 75 y.o.  Admit date: 04/16/2015 Discharge date: 04/17/2015  Admission Diagnoses: adjacent level disease, L2-3    Discharge Diagnoses: same   Discharged Condition: good  Hospital Course: The patient was admitted on 04/16/2015 and taken to the operating room where the patient underwent L23 fusion. The patient tolerated the procedure well and was taken to the recovery room and then to the floor in stable condition. The hospital course was routine. There were no complications. The wound remained clean dry and intact. Pt had appropriate back soreness. No complaints of leg pain or new N/T/W. The patient remained afebrile with stable vital signs, and tolerated a regular diet. The patient continued to increase activities, and pain was well controlled with oral pain medications.   Consults: None  Significant Diagnostic Studies:  Results for orders placed or performed during the hospital encounter of 04/16/15  Glucose, capillary  Result Value Ref Range   Glucose-Capillary 142 (H) 65 - 99 mg/dL  Glucose, capillary  Result Value Ref Range   Glucose-Capillary 181 (H) 65 - 99 mg/dL   Comment 1 Notify RN    Comment 2 Document in Chart   Glucose, capillary  Result Value Ref Range   Glucose-Capillary 137 (H) 65 - 99 mg/dL   Comment 1 Notify RN    Comment 2 Document in Chart   Glucose, capillary  Result Value Ref Range   Glucose-Capillary 136 (H) 65 - 99 mg/dL  Glucose, capillary  Result Value Ref Range   Glucose-Capillary 108 (H) 65 - 99 mg/dL  Type and screen [place]  Result Value Ref Range   ABO/RH(D) A POS    Antibody Screen POS    Sample Expiration 04/19/2015     Chest 2 View  04/08/2015  CLINICAL DATA:  Preoperative exam prior lumbar fusion procedure; history of sleep apnea, gastroesophageal reflux, previous smoker, diabetes. EXAM: CHEST  2 VIEW COMPARISON:  PA and lateral  chest x-ray of February 28, 2014 FINDINGS: The lungs are well-expanded. There is no focal infiltrate. The interstitial markings are chronically increased. There is stable scarring at the left lung base. The heart and pulmonary vascularity are normal. There is mild multilevel degenerative disc disease of the thoracic spine. IMPRESSION: COPD with mild stable interstitial prominence. There is no acute cardiopulmonary abnormality. Electronically Signed   By: Nairi Oswald  [place] M.D.   On: 04/08/2015 09:58   Dg Lumbar Spine 2-3 Views  04/16/2015  CLINICAL DATA:  Posterior fusion. EXAM: LUMBAR SPINE - 2-3 VIEW; DG C-ARM 61-120 MIN COMPARISON:  CT 03/20/2015. FINDINGS: Upper lumbar posterior fusion. Good anatomic alignment. Hardware intact. Prior lower lumbar interbody and posterior fusion . IMPRESSION: Upper lumbar posterior fusion. Good anatomic alignment. Hardware intact. Electronically Signed   By: Marcello Moores  Register   On: 04/16/2015 09:51   Ct Lumbar Spine Wo Contrast  03/20/2015  CLINICAL DATA:  Lumbar degenerative disc disease. Low back pain and left greater than right leg pain. Leg weakness. Symptoms for 8 months. Three prior spine surgeries, most recently 2015. EXAM: CT LUMBAR SPINE WITHOUT CONTRAST TECHNIQUE: Multidetector CT imaging of the lumbar spine was performed without intravenous contrast administration. Multiplanar CT image reconstructions were also generated. COMPARISON:  02/13/2015 lumbar spine MRI FINDINGS: Vertebral alignment is unchanged, with trace retrolisthesis noted of L3 on L4 and L4 on L5. Vertebral body heights are preserved without evidence of compression fracture. Sequelae of L3-4 XLIF are again identified. Left-sided pedicle screws  appear well-positioned without evidence of loosening. There is solid intervertebral osseous fusion. Fusion is also noted across the facet joints and across the spinous processes posteriorly. Sequelae of L5-S1 PLIF are again identified. Bilateral L5 and S1 pedicle  screws are in place without evidence of loosening. The left L5 screw partially breaches the anterosuperior cortex of the pedicle and terminates just lateral to the posterior aspect of the vertebral body. Solid intervertebral osseous fusion and solid posterolateral fusion masses are present. Advanced adjacent segment disease is again seen at L2-3 with severe disc space height loss, endplate sclerosis, and Schmorl's node formation. Mild-to-moderate aortoiliac atherosclerotic calcification is noted. T12-L1:  Negative. L1-2:  Negative. L2-3: Circumferential disc osteophyte complex, disc space height loss, and mild facet arthrosis result in minimal right and mild left neural foraminal stenosis and minimal bilateral lateral recess narrowing without significant spinal stenosis, unchanged. L3-4: Prior fusion. Endplate spurring results in mild right neural foraminal narrowing. No spinal stenosis. L4-5: Mild disc bulging and mild facet arthrosis without stenosis, unchanged. L5-S1: Prior posterior decompression and fusion. No residual stenosis. IMPRESSION: 1. Solid fusion at L3-4 and L5-S1. 2. 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 Electronically Signed   By: Logan Bores M.D.   On: 03/20/2015 09:50   Dg C-arm 1-60 Min  04/16/2015  CLINICAL DATA:  Posterior fusion. EXAM: LUMBAR SPINE - 2-3 VIEW; DG C-ARM 61-120 MIN COMPARISON:  CT 03/20/2015. FINDINGS: Upper lumbar posterior fusion. Good anatomic alignment. Hardware intact. Prior lower lumbar interbody and posterior fusion . IMPRESSION: Upper lumbar posterior fusion. Good anatomic alignment. Hardware intact. Electronically Signed   By: Marcello Moores  Register   On: 04/16/2015 09:51    Antibiotics:  Anti-infectives    Start     Dose/Rate Route Frequency Ordered Stop   04/17/15 2200  doxycycline (VIBRA-TABS) tablet 100 mg     100 mg Oral 2 times daily 04/16/15 1154     04/16/15 1500  ceFAZolin (ANCEF)  IVPB 1 g/50 mL premix     1 g 100 mL/hr over 30 Minutes Intravenous Every 8 hours 04/16/15 1155 04/16/15 2302   04/16/15 0944  Vancomycin HCl POWD  Status:  Discontinued       As needed 04/16/15 0944 04/16/15 1005   04/16/15 0940  vancomycin (VANCOCIN) 1000 MG powder    Comments:  Atilano Median   : cabinet override      04/16/15 0940 04/16/15 2144   04/16/15 0919  bacitracin 50,000 Units in sodium chloride irrigation 0.9 % 500 mL irrigation  Status:  Discontinued       As needed 04/16/15 0919 04/16/15 1005   04/16/15 0800  ceFAZolin (ANCEF) IVPB 2 g/50 mL premix     2 g 100 mL/hr over 30 Minutes Intravenous To [facility] 04/15/15 1323 04/16/15 0850      Discharge Exam: Blood pressure 116/57, pulse 67, temperature 97.9 F (36.6 C), temperature source Oral, resp. rate 18, weight 96.662 kg (213 lb 1.6 oz), SpO2 98 %. Neurologic: Grossly normal Dressing dry  Discharge Medications:     Medication List    TAKE these medications        aspirin 81 MG chewable tablet  Chew 81 mg by mouth daily.     atorvastatin 80 MG tablet  Commonly known as:  LIPITOR  Take 40-80 mg by mouth daily. 80 mg on Monday, Wednesday and Friday , and 40 mg all other days.     Biotin 5000  MCG Caps  Take 5,000 mcg by mouth daily.     CITRUCEL PO  Take 2 capsules by mouth daily.     CYANOCOBALAMIN PO  Take 1,000 Units by mouth daily.     docusate sodium 100 MG capsule  Commonly known as:  COLACE  Take 200 mg by mouth daily.     doxycycline 100 MG tablet  Commonly known as:  VIBRA-TABS  Take 100 mg by mouth 2 (two) times daily.     finasteride 5 MG tablet  Commonly known as:  PROSCAR  Take 5 mg by mouth daily.     Fish Oil 1200 MG Caps  Take 1,200 mg by mouth daily.     glucosamine-chondroitin 500-400 MG tablet  Take 1 tablet by mouth daily.     methocarbamol 500 MG tablet  Commonly known as:  ROBAXIN  Take 1 tablet (500 mg total) by mouth every 6 (six) hours as needed.      multivitamin with minerals Tabs tablet  Take 1 tablet by mouth daily.     naproxen sodium 220 MG tablet  Commonly known as:  ANAPROX  Take 440 mg by mouth 2 (two) times daily with a meal.     omeprazole 20 MG capsule  Commonly known as:  PRILOSEC  Take 20 mg by mouth daily.     Oxycodone HCl 10 MG Tabs  Take 1 tablet (10 mg total) by mouth every 4 (four) hours as needed.     oxyCODONE-acetaminophen 5-325 MG tablet  Commonly known as:  PERCOCET/ROXICET  Take 1-2 tablets by mouth every 4 (four) hours as needed for moderate pain.     pioglitazone-metformin 15-500 MG tablet  Commonly known as:  ACTOPLUS MET  Take 1 tablet by mouth 2 (two) times daily with a meal.     SAW PALMETTO PO  Take 1 capsule by mouth daily.     tamsulosin 0.4 MG Caps capsule  Commonly known as:  FLOMAX  Take 0.4 mg by mouth daily.     VITAMIN D (ERGOCALCIFEROL) PO  Take 2,000 Units by mouth daily.        Disposition: home   Final Dx: L2-3 fusion      Discharge Instructions     Remove dressing in 72 hours    Complete by:  As directed      Call MD for:  difficulty breathing, headache or visual disturbances    Complete by:  As directed      Call MD for:  persistant nausea and vomiting    Complete by:  As directed      Call MD for:  redness, tenderness, or signs of infection (pain, swelling, redness, odor or green/yellow discharge around incision site)    Complete by:  As directed      Call MD for:  severe uncontrolled pain    Complete by:  As directed      Call MD for:  temperature >100.4    Complete by:  As directed      Diet - low sodium heart healthy    Complete by:  As directed      Discharge instructions    Complete by:  As directed   May shower, no driving, no bending or lifting     Increase activity slowly    Complete by:  As directed               Signed: Trishia Cuthrell S 04/17/2015, 9:14 AM

## 2015-04-17 NOTE — Discharge Instructions (Signed)
Wound Care °Keep incision covered and dry for one week.  If you shower prior to then, cover incision with plastic wrap.  °You may remove outer bandage after one week and shower.  °Do not put any creams, lotions, or ointments on incision. °Leave steri-strips on neck.  They will fall off by themselves. °Activity °Walk each and every day, increasing distance each day. °No lifting greater than 5 lbs.  Avoid bending, arching, or twisting. °No driving for 2 weeks; may ride as a passenger locally. °If provided with back brace, wear when out of bed.  It is not necessary to wear in bed. °Diet °Resume your normal diet.  °Return to Work °Will be discussed at you follow up appointment. °Call Your Doctor If Any of These Occur °Redness, drainage, or swelling at the wound.  °Temperature greater than 101 degrees. °Severe pain not relieved by pain medication. °Incision starts to come apart. °Follow Up Appt °Call today for appointment in 2-3 weeks ([phone]) or for problems.  If you have any hardware placed in your spine, you will need an x-ray before your appointment. °

## 2015-04-17 NOTE — Evaluation (Signed)
Occupational Therapy Evaluation Patient Details Name: [name] MelenaGary P Dyer MRN: [id number] DOB: 1940/08/09 Today's Date: 04/17/2015    History of Present Illness Patient is a 75 y/o male with hx of HLD, DM, insomnia and sleep apnea s/p L2-3 PLIF.    Clinical Impression   Pt reports he was independent with ADLs and mobility PTA. Currently pt is overall supervision for safety with ADLs and functional mobility. Pt able to verbally recall 3/3 back precautions at start of session and maintain throughout with min verbal cues. All back, ADL, and safety education complete; pt with no further questions or concerns for OT. Pt planning to d/c home with 24/7 supervision from his wife. Pt with no further acute OT needs identified; signing off at this time. Thank you for this referral.    Follow Up Recommendations  No OT follow up;Supervision - Intermittent    Equipment Recommendations  None recommended by OT    Recommendations for Other Services       Precautions / Restrictions Precautions Precautions: Back Precaution Booklet Issued: Yes (comment) Precaution Comments: Pt able to verbally recall 3/3 back precautions at start of session. Required Braces or Orthoses: Spinal Brace Spinal Brace: Lumbar corset Restrictions Weight Bearing Restrictions: No      Mobility Bed Mobility Overal bed mobility: Modified Independent             General bed mobility comments: Pt able to return demo log roll technique with HOB flat and no use of rails.  Transfers Overall transfer level: Modified independent Equipment used: None Transfers: Sit to/from Stand Sit to Stand: Modified independent (Device/Increase time)              Balance Overall balance assessment: Needs assistance Sitting-balance support: Feet supported;No upper extremity supported Sitting balance-Leahy Scale: Good     Standing balance support: No upper extremity supported;During functional activity Standing balance-Leahy  Scale: Fair                              ADL Overall ADL's : Needs assistance/impaired Eating/Feeding: Modified independent;Sitting   Grooming: Supervision/safety;Standing Grooming Details (indicate cue type and reason): Educated on use of 2 cups for oral care. Upper Body Bathing: Modified independent;Sitting   Lower Body Bathing: Supervison/ safety;Sit to/from stand   Upper Body Dressing : Set up;Sitting Upper Body Dressing Details (indicate cue type and reason): Pt able to don brace with set up. Lower Body Dressing: Supervision/safety;Sit to/from stand Lower Body Dressing Details (indicate cue type and reason): Pt able to cross foot over opposite knee for donning socks. Educated on compensatory strategies for LB ADLs. Pt reports wife can assist if needed. Toilet Transfer: Supervision/safety;Ambulation;Comfort height toilet Toilet Transfer Details (indicate cue type and reason): Simulated by transfer from EOB. Toileting- Clothing Manipulation and Hygiene: Supervision/safety;Sit to/from stand   Tub/ Shower Transfer: Supervision/safety;Ambulation;Shower seat   Functional mobility during ADLs: Supervision/safety General ADL Comments: No family present for OT eval. Educated pt on maintaining back precautions during functional activities, log roll technique, placing frequently used items at coutner top height, sitting on shower seat for safety; pt verbalized understanding.       Vision Vision Assessment?: No apparent visual deficits   Perception     Praxis      Pertinent Vitals/Pain Pain Assessment: 0-10 Pain Score: 9  Pain Location: back, incision  Pain Descriptors / Indicators: Sore;Aching Pain Intervention(s): Monitored during session;Premedicated before session;Repositioned     Hand Dominance Right  Extremity/Trunk Assessment Upper Extremity Assessment Upper Extremity Assessment: Overall WFL for tasks assessed   Lower Extremity Assessment Lower Extremity  Assessment: Defer to PT evaluation   Cervical / Trunk Assessment Cervical / Trunk Assessment: Other exceptions Cervical / Trunk Exceptions: pt s/p lumbar sx   Communication Communication Communication: No difficulties   Cognition Arousal/Alertness: Awake/alert Behavior During Therapy: WFL for tasks assessed/performed Overall Cognitive Status: Within Functional Limits for tasks assessed                     General Comments       Exercises       Shoulder Instructions      Home Living Family/patient expects to be discharged to:: Private residence Living Arrangements: Spouse/significant other Available Help at Discharge: Family;Available 24 hours/day Type of Home: House Home Access: Stairs to enter [employer] of Steps: 1 Entrance Stairs-Rails: None Home Layout: One level     Bathroom Shower/Tub: Producer, television/film/video: Handicapped height Bathroom Accessibility: Yes How Accessible: Accessible via walker Home Equipment: Walker - 2 wheels;Cane - single point;Crutches;Bedside commode;Shower seat;Shower seat - built Designer, fashion/clothing: Reacher        Prior Functioning/Environment Level of Independence: Independent        CommentsDiplomatic Services operational officer. Plays golf twice/week.    OT Diagnosis: Generalized weakness;Acute pain   OT Problem List:     OT Treatment/Interventions:      OT Goals(Current goals can be found in the care plan section) Acute Rehab OT Goals OT Goal Formulation: All assessment and education complete, DC therapy  OT Frequency:     Barriers to D/C:            Co-evaluation              End of Session Equipment Utilized During Treatment: Back brace  Activity Tolerance: Patient tolerated treatment well Patient left: in chair;with call bell/phone within reach   Time: 0830-0842 OT Time Calculation (min): 12 min Charges:  OT General Charges $OT Visit: 1 Procedure OT Evaluation $OT Eval  Moderate Complexity: 1 Procedure G-Codes: OT G-codes **NOT FOR INPATIENT CLASS** Functional Assessment Tool Used: Clinical judgement Functional Limitation: Self care Self Care Current Status (Z6109): At least 1 percent but less than 20 percent impaired, limited or restricted Self Care Goal Status (U0454): At least 1 percent but less than 20 percent impaired, limited or restricted Self Care Discharge Status [phone]): At least 1 percent but less than 20 percent impaired, limited or restricted   Gaye Alken M.S., OTR/L Pager: [phone]  04/17/2015, 8:50 AM

## 2015-04-17 NOTE — Progress Notes (Signed)
Pt doing well. Pt given D/C instructions with Rx's, verbal understanding was provided. Pt's incision is clean and dry with no sign of infection. Pt's IV was removed prior to D/C. Pt D/C'd home via wheelchair @ 1045 per MD order. Pt is stable @ D/C and has no other needs at this time. Rema FendtAshley Kaedyn Belardo, RN

## 2015-04-18 LAB — TYPE AND SCREEN
ABO/RH(D): A POS
Antibody Screen: POSITIVE
DAT, IgG: NEGATIVE
Unit division: 0
Unit division: 0

## 2015-05-27 ENCOUNTER — Encounter: Payer: Self-pay | Admitting: Neurology

## 2015-05-27 ENCOUNTER — Ambulatory Visit (INDEPENDENT_AMBULATORY_CARE_PROVIDER_SITE_OTHER): Payer: Medicare Other | Admitting: Neurology

## 2015-05-27 VITALS — BP 130/78 | HR 62 | Resp 20 | Ht 66.0 in | Wt 211.0 lb

## 2015-05-27 DIAGNOSIS — E669 Obesity, unspecified: Secondary | ICD-10-CM

## 2015-05-27 DIAGNOSIS — Z9989 Dependence on other enabling machines and devices: Principal | ICD-10-CM

## 2015-05-27 DIAGNOSIS — G4733 Obstructive sleep apnea (adult) (pediatric): Secondary | ICD-10-CM

## 2015-05-27 DIAGNOSIS — J302 Other seasonal allergic rhinitis: Secondary | ICD-10-CM | POA: Insufficient documentation

## 2015-05-27 MED ORDER — TRIAMCINOLONE ACETONIDE 55 MCG/ACT NA AERO: 2.0000 | INHALATION_SPRAY | Freq: Every day | NASAL | Status: DC

## 2015-05-27 NOTE — Progress Notes (Signed)
SLEEP MEDICINE CLINIC   Provider:  Larey Seat, M D  Referring Provider: [name], MD Primary Care Physician:  [name], MD  Chief Complaint  Patient presents with  . New Patient (Initial Visit)    needs new cpap, has been 15 years, rm 11, alone    HPI:  [name] is a 75 y.o. male , seen here as a referral from Dr. Maudie Mercury for a re evaluation of his sleep disordered breathing,  [name] has been a CPAP user for over 15 years. He underwent surgery at [facility], his fourth operation on his back this time a fusion with hardware in the upper limbal area. It was during his hospitalization that he was asked if he knew the settings of his CPAP and he did not. He has been using an older machine now for many many years, compliantly but is in need of a new evaluation, his machine also does not allow [place] to download for therapeutic data. He reports no difficulties with using CPAP except for occasional nocturnal nasal congestion , problems with the nocturnal airflow through the nose. He has allergic rhinitis, also seasonally dependent.  His original sleep study was performed 15 years ago at [facility] in the local sleep center. He has had no follow-up in over a decade. Mr. Enrico assured me that he still feels that the CPAP is very helpful, or night sweats were he for any reason could not use CPAP he does not and her sounds sleep, he usually has a very sore throat in the morning and he feels not restored/  refreshed .  His past medical history also contains some problems affecting his sleep, such as gastroesophageal reflux, back pain and elbow pain which makes it harder to choose a comfortable sleep position, knee pain ongoing, diabetes mellitus, he has been followed by Dr. Delfino Lovett ramus and is expecting to have a partial knee replacement let side soon.   Sleep habits are as follows:   [name] is partially retired but always worked in daytime. He is a right-handed  Caucasian married gentleman who shares a bedroom with his also retired wife. He has been using CPAP for 15 years. His wife has noted some breakthrough snoring. 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  Patient goes to bed around 11:30 PM and usually falls asleep within 10 minutes, but he tends to wake up within an hour. After this first arousal from sleep he has difficulties falling back asleep. He will go back to sleep again about an hour later and sleeps until 6 AM usually when he has a bathroom break. He usually arises at that time. He has never relied on an alarm. He prefers to sleep on his left side sometimes uses a pillow to buffer his knees or elbow. His bedroom is described as cool, quiet and dark, sleeping on one pillow for head support. He usually has one bathroom break on occasion twice but not more than that. His nocturnal sleep duration is about 5.5 -6 hours. He will drink coffee in the morning after he rises, and during the day he avoids further caffeine intake. The patient is a nonsmoker and rarely drinks. [name] is part-time retired and keeps active during the lunch hour but in the late afternoon sometimes takes a nap. These are brief power naps of 10 minutes duration. He has noticed that should he nap longer than 30 or 40 minutes he has  a difficult time sleeping at night.   Sleep medical history and family sleep history: There is no childhood history of enuresis, night terrors or sleepwalking. Social history: No shift work history. See above. Married 55 years, 6 children , 19 grandchildren.   Review of Systems: Out of a complete 14 system review, the patient complains of only the following symptoms, and all other reviewed systems are negative.   Epworth score 8 , Fatigue severity score 35  , depression score n/a   Social History   Social History  . Marital Status: Married    Spouse Name: N/A  . Number of Children: N/A  . Years of Education: N/A    Occupational History  . Not on file.   Social History Main Topics  . Smoking status: Former Smoker -- 1.50 packs/day for 30 years    Types: Cigarettes    Quit date: 03/16/1985  . Smokeless tobacco: Never Used     Comment: quit 25+yrs ago  . Alcohol Use: Yes     Comment: rarely  . Drug Use: No  . Sexual Activity: Not Currently   Other Topics Concern  . Not on file   Social History Narrative    No family history on file.  Past Medical History  Diagnosis Date  . Hyperlipidemia     takes Lipitor and Fish Oil daily  . Sleep apnea     uses CPAP  . Pneumonia     after surgery in 2012  . Arthritis   . Joint pain   . Joint swelling   . Chronic back pain     DDD  . Dysphagia     EGD with dilitation  . GERD (gastroesophageal reflux disease)     takes Omeprazole daily  . Multiple duodenal ulcers   . Enlarged prostate     sees a urologist  . Diabetes mellitus     takes Actos-met  . Insomnia     d/t pain  . Constipation   . Barrett's esophagus with dysplasia   . History of bronchitis   . Wears glasses   . Nocturia   . Urinary frequency   . Hard of hearing     Past Surgical History  Procedure Laterality Date  . Rotator cuff repair  16yr ago    bilateral  . Cervical fusion  2012  . Joint replacement  2011    right knee replacement  . Cholecystectomy  12+yrs ago  . Finger surgery      right pointer  . Elbow surgery      right   . Colonoscopy    . Esophagogastroduodenoscopy    . Anterior lat lumbar fusion  12/02/2011    Procedure: ANTERIOR LATERAL LUMBAR FUSION 1 LEVEL;  Surgeon: DEustace Moore MD;  [facility];  Service: Neurosurgery;  Laterality: Left;  left lumbar three-four  . Lumbar fusion  03/16/2012    Dr JRonnald Ramp . Eye surgery Right     Current Outpatient Prescriptions  Medication Sig Dispense Refill  . aspirin 81 MG chewable tablet Chew 81 mg by mouth daily.    .[name] Kitchenatorvastatin (LIPITOR) 80 MG tablet Take 40-80 mg by mouth daily. 80 mg  on Monday, Wednesday and Friday , and 40 mg all other days.    . Biotin 5000 MCG CAPS Take 5,000 mcg by mouth daily.    . CYANOCOBALAMIN PO Take 1,000 Units by mouth daily.     .[name] Kitchendocusate sodium (COLACE) 100 MG capsule Take  200 mg by mouth daily.    [name] Kitchen doxycycline (VIBRA-TABS) 100 MG tablet Take 100 mg by mouth 2 (two) times daily.    . finasteride (PROSCAR) 5 MG tablet Take 5 mg by mouth daily.    [name] Kitchen glucosamine-chondroitin 500-400 MG tablet Take 1 tablet by mouth daily.    . methocarbamol (ROBAXIN) 500 MG tablet Take 1 tablet (500 mg total) by mouth every 6 (six) hours as needed. 60 tablet 1  . Methylcellulose, Laxative, (CITRUCEL PO) Take 2 capsules by mouth daily.    . Multiple Vitamin (MULTIVITAMIN WITH MINERALS) TABS Take 1 tablet by mouth daily.    . naproxen sodium (ANAPROX) 220 MG tablet Take 440 mg by mouth 2 (two) times daily with a meal.    . Omega-3 Fatty Acids (FISH OIL) 1200 MG CAPS Take 1,200 mg by mouth daily.     [name] Kitchen omeprazole (PRILOSEC) 20 MG capsule Take 20 mg by mouth daily.    . pioglitazone-metformin (ACTOPLUS MET) 15-500 MG per tablet Take 1 tablet by mouth 2 (two) times daily with a meal.    . Saw Palmetto, Serenoa repens, (SAW PALMETTO PO) Take 1 capsule by mouth daily.    . tamsulosin (FLOMAX) 0.4 MG CAPS capsule Take 0.4 mg by mouth daily.    [name] Kitchen VITAMIN D, ERGOCALCIFEROL, PO Take 2,000 Units by mouth daily.      No current facility-administered medications for this visit.    Allergies as of 05/27/2015  . (No Known Allergies)    Vitals: BP 130/78 mmHg  Pulse 62  Resp 20  Ht 5' 6"  (1.676 m)  Wt 211 lb (95.709 kg)  BMI 34.07 kg/m2 Last Weight:  Wt Readings from Last 1 Encounters:  05/27/15 211 lb (95.709 kg)   CLE:XNTZ mass index is 34.07 kg/(m^2).     Last Height:   Ht Readings from Last 1 Encounters:  05/27/15 5' 6"  (1.676 m)    Physical exam:  General: The patient is awake, alert and appears not in acute distress. The patient is well groomed. Head:  Normocephalic, atraumatic. Neck is supple. Mallampati 3, the tip of the uvula does not lift above the tongue ground. ,  neck circumference: 17.5 . Nasal airflow restricted on the left, septal deviation. TMJ is notevident . Retrognathia is not seen.  Cardiovascular:  Regular rate and rhythm , without  murmurs or carotid bruit, and without distended neck veins. Respiratory: Lungs are clear to auscultation. Skin:  Without evidence of edema, or rash Trunk: BMI is elevated  The patient's posture is erect    Neurologic exam : The patient is awake and alert, oriented to place and time.   Memory subjective escribed as intact.  Attention span & concentration ability appears normal.  Speech is fluent,  without dysarthria, dysphonia or aphasia.  Mood and affect are appropriate.  Cranial nerves: Pupils are equal and briskly reactive to light. Funduscopic exam without  evidence of pallor or edema. Extraocular movements  in vertical and horizontal planes intact and without nystagmus. Visual fields by finger perimetry are intact.Hearing to finger rub intact. Facial sensation intact to fine touch.Facial motor strength is symmetric and tongue and uvula move midline. Shoulder shrug was symmetrical.  Motor exam: Normal tone, muscle bulk and symmetric strength in all extremities. Stance is stable and normal. Tandem gait is unfragmented.   The patient was advised of the nature of the diagnosed sleep disorder , the treatment options and risks for general a health and wellness arising from not treating  the condition.  I spent more than 40 minutes of face to face time with the patient. Greater than 50% of time was spent in counseling and coordination of care. We have discussed the diagnosis and differential and I answered the patient's questions.     Dear Dr. Maudie Mercury,    Thank you for sending this pleasant patient my way. I am glad to be able to help  him obtaining new equipment, the first for him under medicare  guidelines.   Assessment:  After physical and neurologic examination, review of laboratory studies,  Personal review of imaging studies, reports of other /same  Imaging studies ,  Results of polysomnography/ neurophysiology testing and pre-existing records as far as provided in visit., my assessment is   1) Mr. Weisgerber will be scheduled for a re-titration to CPAP, I would also like him to be fitted for a nasal pillow preferably a dream where since the hose arises from the top and cannot get entangled. She'll does not work well for him he is currently using and nasal mask and he may respond well to a Pillairo.   2) Given Mr. Geister's observation that on nights when he couldn't use CPAP he doesn't sleep very well, I feel that it is warranted to be titrated with one hour of baseline. He does not report headaches in the morning and he has not been woken up by headaches, palpitations or diaphoresis. Complicating is that he has some rhinitis and a nasal septum deviation I will prescribe a Nasacort spray that he can use as needed and that should help him during allergy season to keep the nasal airflow patent.  3) obesity , rhinitis .   Plan:  Treatment plan and additional workup :  RETITRATION with one hour baseline. Looking forward to see him in his first follow up with the new machine about 30-60 days later.    Asencion Partridge Sujata Maines MD  05/27/2015   CC: [name], Md [address] [place], [place] [address]

## 2015-06-18 ENCOUNTER — Ambulatory Visit (INDEPENDENT_AMBULATORY_CARE_PROVIDER_SITE_OTHER): Payer: Medicare Other | Admitting: Neurology

## 2015-06-18 DIAGNOSIS — G4733 Obstructive sleep apnea (adult) (pediatric): Secondary | ICD-10-CM

## 2015-06-18 DIAGNOSIS — Z9989 Dependence on other enabling machines and devices: Principal | ICD-10-CM

## 2015-06-26 ENCOUNTER — Telehealth: Payer: Self-pay

## 2015-06-26 DIAGNOSIS — G4733 Obstructive sleep apnea (adult) (pediatric): Secondary | ICD-10-CM

## 2015-06-26 NOTE — Telephone Encounter (Signed)
I called pt to discuss sleep study results. Pt's wife answered, she will ask him to give me a call. Pt's wife was advised that I am not in the office tomorrow, but I will call him back on Monday if we do not get a chance to speak today.

## 2015-06-26 NOTE — Telephone Encounter (Signed)
------------------------------------------------------------   Fransisco HertzCaller [facility] Surgery[facility]               CID [id number]  Patient SAME                 Pt's Dr NOT SURE     Area Code 336 Phone# [phone] * DOB 03/31/2040    RE RETURNING KRISTEN'S CALL ABOUT SLEEP STUDY                                                             Disp:Y/N N If Y = C/B If No Response In 20minutes ============================================================

## 2015-06-26 NOTE — Telephone Encounter (Signed)
Pt returned call , please use cell [phone]

## 2015-06-26 NOTE — Telephone Encounter (Signed)
Spoke to pt regarding his sleep study results. I advised pt that his study revealed sleep apnea and Dr. Vickey Hugerohmeier recommends starting a cpap. Pt is agreeable to starting a new cpap, he has been on cpap for many years. Pt is asking that [facility]. I advised pt to avoid sedative-hypnotics which may worsen sleep apnea, alcohol and tobacco.  I advised pt to lose weight, diet, and exercise if not contraindicated by his other physicians. Pt verbalized understanding. A follow up appt was made for 09/02/15 at 8:30. Pt verbalized understanding of his results. Pt had no questions at this time but was encouraged to call back if questions arise.

## 2015-07-12 ENCOUNTER — Ambulatory Visit: Payer: Self-pay | Admitting: Orthopedic Surgery

## 2015-07-22 NOTE — Patient Instructions (Signed)
[name] MelenaGary P Dyer  07/22/2015   Your procedure is scheduled on: 07/30/15   [facility]  [place] to 3rd floor to  Short Stay Center at    0730 AM.  Call this number if you have problems the morning of surgery [phone]   Remember: ONLY 1 PERSON MAY GO WITH YOU TO SHORT STAY TO GET  READY MORNING OF YOUR SURGERY.  Do not eat food or drink liquids :After Midnight.     Take these medicines the morning of surgery with A SIP OF WATER: Proscar ( Finasteride), Prilosec ( Omeprazole)                                You may not have any metal on your body including hair pins and              piercings  Do not wear jewelry,  lotions, powders or perfumes, deodorant         .              Men may shave face and neck.   Do not bring valuables to the hospital.  IS NOT             RESPONSIBLE   FOR VALUABLES.  Contacts, dentures or bridgework may not be worn into surgery.  Leave suitcase in the car. After surgery it may be brought to your room.        Special Instructions: coughing and deep breathing exercises, leg exercises               Please read over the following fact sheets you were given: _____________________________________________________________________             [facility] - Preparing for [facility] Before surgery, you can play an important role.  Because skin is not sterile, your skin needs to be as free of germs as possible.  You can reduce the number of germs on your skin by washing with CHG (chlorahexidine gluconate) soap before surgery.  CHG is an antiseptic cleaner which kills germs and bonds with the skin to continue killing germs even after washing. Please DO NOT use if you have an allergy to CHG or antibacterial soaps.  If your skin becomes reddened/irritated stop using the CHG and inform your nurse when you arrive at Short Stay. Do not shave (including legs and underarms) for at least 48 hours prior to  the first CHG shower.  You may shave your face/neck. Please follow these instructions carefully:  1.  Shower with CHG Soap the night before surgery and the  morning of Surgery.  2.  If you choose to wash your hair, wash your hair first as usual with your  normal  shampoo.  3.  After you shampoo, rinse your hair and body thoroughly to remove the  shampoo.                           4.  Use CHG as you would any other liquid soap.  You can apply chg directly  to the skin and wash                       Gently with a scrungie or clean washcloth.  5.  Apply the CHG Soap to your body ONLY FROM THE NECK DOWN.   Do not use on face/ open                           Wound or open sores. Avoid contact with eyes, ears mouth and genitals (private parts).                       Wash face,  Genitals (private parts) with your normal soap.             6.  Wash thoroughly, paying special attention to the area where your surgery  will be performed.  7.  Thoroughly rinse your body with warm water from the neck down.  8.  DO NOT shower/wash with your normal soap after using and rinsing off  the CHG Soap.                9.  Pat yourself dry with a clean towel.            10.  Wear clean pajamas.            11.  Place clean sheets on your bed the night of your first shower and do not  sleep with pets. Day of Surgery : Do not apply any lotions/deodorants the morning of surgery.  Please wear clean clothes to the hospital/surgery center.  FAILURE TO FOLLOW THESE INSTRUCTIONS MAY RESULT IN THE CANCELLATION OF YOUR SURGERY PATIENT SIGNATURE_________________________________  NURSE SIGNATURE__________________________________  ________________________________________________________________________  WHAT IS A BLOOD TRANSFUSION? Blood Transfusion Information  A transfusion is the replacement of blood or some of its parts. Blood is made up of multiple cells which provide different functions.  Red blood cells carry oxygen and  are used for blood loss replacement.  White blood cells fight against infection.  Platelets control bleeding.  Plasma helps clot blood.  Other blood products are available for specialized needs, such as hemophilia or other clotting disorders. BEFORE THE TRANSFUSION  Who gives blood for transfusions?   Healthy volunteers who are fully evaluated to make sure their blood is safe. This is blood bank blood. Transfusion therapy is the safest it has ever been in the practice of medicine. Before blood is taken from a donor, a complete history is taken to make sure that person has no history of diseases nor engages in risky social behavior (examples are intravenous drug use or sexual activity with multiple partners). The donor's travel history is screened to minimize risk of transmitting infections, such as malaria. The donated blood is tested for signs of infectious diseases, such as HIV and hepatitis. The blood is then tested to be sure it is compatible with you in order to minimize the chance of a transfusion reaction. If you or a relative donates blood, this is often done in anticipation of surgery and is not appropriate for emergency situations. It takes many days to process the donated blood. RISKS AND COMPLICATIONS Although transfusion therapy is very safe and saves many lives, the main dangers of transfusion include:  1. Getting an infectious disease. 2. Developing a transfusion reaction. This is an allergic reaction to something in the blood you were given. Every precaution is taken to prevent this. The decision to have a blood transfusion has been considered carefully by your caregiver before blood is given. Blood is not given unless the benefits outweigh the risks. AFTER THE TRANSFUSION  Right after receiving a  blood transfusion, you will usually feel much better and more energetic. This is especially true if your red blood cells have gotten low (anemic). The transfusion raises the level of  the red blood cells which carry oxygen, and this usually causes an energy increase.  The nurse administering the transfusion will monitor you carefully for complications. HOME CARE INSTRUCTIONS  No special instructions are needed after a transfusion. You may find your energy is better. Speak with your caregiver about any limitations on activity for underlying diseases you may have. SEEK MEDICAL CARE IF:   Your condition is not improving after your transfusion.  You develop redness or irritation at the intravenous (IV) site. SEEK IMMEDIATE MEDICAL CARE IF:  Any of the following symptoms occur over the next 12 hours:  Shaking chills.  You have a temperature by mouth above 102 F (38.9 C), not controlled by medicine.  Chest, back, or muscle pain.  People around you feel you are not acting correctly or are confused.  Shortness of breath or difficulty breathing.  Dizziness and fainting.  You get a rash or develop hives.  You have a decrease in urine output.  Your urine turns a dark color or changes to pink, red, or brown. Any of the following symptoms occur over the next 10 days:  You have a temperature by mouth above 102 F (38.9 C), not controlled by medicine.  Shortness of breath.  Weakness after normal activity.  The white part of the eye turns yellow (jaundice).  You have a decrease in the amount of urine or are urinating less often.  Your urine turns a dark color or changes to pink, red, or brown. Document Released: 01/23/2000 Document Revised: 04/19/2011 Document Reviewed: 09/11/2007 ExitCare Patient Information 2014 [place] [place].  _______________________________________________________________________  Incentive Spirometer  An incentive spirometer is a tool that can help keep your lungs clear and active. This tool measures how well you are filling your lungs with each breath. Taking long deep breaths may help reverse or decrease the chance of developing  breathing (pulmonary) problems (especially infection) following:  A long period of time when you are unable to move or be active. BEFORE THE PROCEDURE   If the spirometer includes an indicator to show your best effort, your nurse or respiratory therapist will set it to a desired goal.  If possible, sit up straight or lean slightly forward. Try not to slouch.  Hold the incentive spirometer in an upright position. INSTRUCTIONS FOR USE  3. Sit on the edge of your bed if possible, or sit up as far as you can in bed or on a chair. 4. Hold the incentive spirometer in an upright position. 5. Breathe out normally. 6. Place the mouthpiece in your mouth and seal your lips tightly around it. 7. Breathe in slowly and as deeply as possible, raising the piston or the ball toward the top of the column. 8. Hold your breath for 3-5 seconds or for as long as possible. Allow the piston or ball to fall to the bottom of the column. 9. Remove the mouthpiece from your mouth and breathe out normally. 10. Rest for a few seconds and repeat Steps 1 through 7 at least 10 times every 1-2 hours when you are awake. Take your time and take a few normal breaths between deep breaths. 11. The spirometer may include an indicator to show your best effort. Use the indicator as a goal to work toward during each repetition. 12. After each set of 10  deep breaths, practice coughing to be sure your lungs are clear. If you have an incision (the cut made at the time of surgery), support your incision when coughing by placing a pillow or rolled up towels firmly against it. Once you are able to get out of bed, walk around indoors and cough well. You may stop using the incentive spirometer when instructed by your caregiver.  RISKS AND COMPLICATIONS  Take your time so you do not get dizzy or light-headed.  If you are in pain, you may need to take or ask for pain medication before doing incentive spirometry. It is harder to take a deep  breath if you are having pain. AFTER USE  Rest and breathe slowly and easily.  It can be helpful to keep track of a log of your progress. Your caregiver can provide you with a simple table to help with this. If you are using the spirometer at home, follow these instructions: SEEK MEDICAL CARE IF:   You are having difficultly using the spirometer.  You have trouble using the spirometer as often as instructed.  Your pain medication is not giving enough relief while using the spirometer.  You develop fever of 100.5 F (38.1 C) or higher. SEEK IMMEDIATE MEDICAL CARE IF:   You cough up bloody sputum that had not been present before.  You develop fever of 102 F (38.9 C) or greater.  You develop worsening pain at or near the incision site. MAKE SURE YOU:   Understand these instructions.  Will watch your condition.  Will get help right away if you are not doing well or get worse. Document Released: 06/07/2006 Document Revised: 04/19/2011 Document Reviewed: 08/08/2006 [facility] Patient Information 2014 [place], [place].   ________________________________________________________________________

## 2015-07-24 ENCOUNTER — Encounter (HOSPITAL_COMMUNITY)
Admission: RE | Admit: 2015-07-24 | Discharge: 2015-07-24 | Disposition: A | Discharge status: Home / Self Care | Payer: Medicare Other | Source: Ambulatory Visit | Attending: Orthopedic Surgery | Admitting: Orthopedic Surgery

## 2015-07-24 ENCOUNTER — Encounter (HOSPITAL_COMMUNITY): Payer: Self-pay

## 2015-07-24 DIAGNOSIS — Z01812 Encounter for preprocedural laboratory examination: Secondary | ICD-10-CM | POA: Insufficient documentation

## 2015-07-24 DIAGNOSIS — M1712 Unilateral primary osteoarthritis, left knee: Secondary | ICD-10-CM | POA: Insufficient documentation

## 2015-07-24 DIAGNOSIS — Z0183 Encounter for blood typing: Secondary | ICD-10-CM | POA: Diagnosis not present

## 2015-07-24 LAB — COMPREHENSIVE METABOLIC PANEL
ALT: 23 U/L (ref 17–63)
AST: 23 U/L (ref 15–41)
Albumin: 4 g/dL (ref 3.5–5.0)
Alkaline Phosphatase: 51 U/L (ref 38–126)
Anion gap: 5 (ref 5–15)
BUN: 16 mg/dL (ref 6–20)
CO2: 27 mmol/L (ref 22–32)
Calcium: 9 mg/dL (ref 8.9–10.3)
Chloride: 106 mmol/L (ref 101–111)
Creatinine, Ser: 0.78 mg/dL (ref 0.61–1.24)
GFR calc Af Amer: >60 mL/min (ref >60)
GFR calc non Af Amer: >60 mL/min (ref >60)
Glucose, Bld: 153 mg/dL — ABNORMAL HIGH (ref 65–99)
Potassium: 4.1 mmol/L (ref 3.5–5.1)
Sodium: 138 mmol/L (ref 135–145)
Total Bilirubin: 1.2 mg/dL (ref 0.3–1.2)
Total Protein: 6.6 g/dL (ref 6.5–8.1)

## 2015-07-24 LAB — CBC
HCT: 39.8 % (ref 39.0–52.0)
Hemoglobin: 14 g/dL (ref 13.0–17.0)
MCH: 34 pg (ref 26.0–34.0)
MCHC: 35.2 g/dL (ref 30.0–36.0)
MCV: 96.6 fL (ref 78.0–100.0)
Platelets: 231 10*3/uL (ref 150–400)
RBC: 4.12 MIL/uL — ABNORMAL LOW (ref 4.22–5.81)
RDW: 13.2 % (ref 11.5–15.5)
WBC: 6.7 10*3/uL (ref 4.0–10.5)

## 2015-07-24 LAB — URINALYSIS, ROUTINE W REFLEX MICROSCOPIC
Bilirubin Urine: NEGATIVE
Glucose, UA: NEGATIVE mg/dL
Hgb urine dipstick: NEGATIVE
Ketones, ur: NEGATIVE mg/dL
Leukocytes, UA: NEGATIVE
Nitrite: NEGATIVE
Protein, ur: NEGATIVE mg/dL
Specific Gravity, Urine: 1.023 (ref 1.005–1.030)
pH: 5 (ref 5.0–8.0)

## 2015-07-24 LAB — SURGICAL PCR SCREEN
MRSA, PCR: NEGATIVE
Staphylococcus aureus: NEGATIVE

## 2015-07-24 LAB — TYPE AND SCREEN
ABO/RH(D): A POS
Antibody Screen: POSITIVE
DAT, IgG: NEGATIVE

## 2015-07-24 LAB — PROTIME-INR
INR: 1.07 (ref 0.00–1.49)
Prothrombin Time: 13.7 seconds (ref 11.6–15.2)

## 2015-07-24 LAB — APTT: aPTT: 41 seconds — ABNORMAL HIGH (ref 24–37)

## 2015-07-24 NOTE — Progress Notes (Signed)
EKG-04/08/15- EPIC  04/08/15- 2V CXR- EPIC

## 2015-07-24 NOTE — Progress Notes (Signed)
PTT done 07/24/15 faxed via EPIC to Dr Lequita HaltAluisio.

## 2015-07-25 LAB — HEMOGLOBIN A1C
Hgb A1c MFr Bld: 6.6 % — ABNORMAL HIGH (ref 4.8–5.6)
Mean Plasma Glucose: 143 mg/dL

## 2015-07-29 NOTE — H&P (Signed)
TOTAL KNEE ADMISSION H&P  Patient is being admitted for left unicompartmental knee arthroplasty.  Subjective:  Chief Complaint:left knee pain.  HPI: [name], 75 y.o. male, has a history of pain and functional disability in the left knee due to arthritis and has failed non-surgical conservative treatments for greater than 12 weeks to includeNSAID's and/or analgesics, corticosteriod injections, flexibility and strengthening excercises and activity modification.  Onset of symptoms was gradual, starting >10 years ago with gradually worsening course since that time. The patient noted no past surgery on the left knee(s).  Patient currently rates pain in the left knee(s) at 8 out of 10 with activity. Patient has night pain, worsening of pain with activity and weight bearing, pain that interferes with activities of daily living, pain with passive range of motion, crepitus and joint swelling.  Patient has evidence of periarticular osteophytes and joint space narrowing by imaging studies. There is no active infection.  Patient Active Problem List   Diagnosis Date Noted  . Other seasonal allergic rhinitis 05/27/2015  . OSA on CPAP 05/27/2015  . Obesity 05/27/2015  . S/P lumbar spinal fusion 04/16/2015   Past Medical History  Diagnosis Date  . Hyperlipidemia     takes Lipitor and Fish Oil daily  . Arthritis   . Joint pain   . Joint swelling   . Chronic back pain     DDD  . Dysphagia     EGD with dilitation  . GERD (gastroesophageal reflux disease)     takes Omeprazole daily  . Multiple duodenal ulcers   . Enlarged prostate     sees a urologist  . Diabetes mellitus     takes Actos-met  . Insomnia     d/t pain  . Constipation   . Barrett's esophagus with dysplasia   . History of bronchitis   . Wears glasses   . Nocturia   . Urinary frequency   . Hard of hearing   . Sleep apnea     uses CPAP- does not know settings     Past Surgical History  Procedure Laterality Date  .  Rotator cuff repair  65yr ago    bilateral  . Cervical fusion  2012  . Joint replacement  2011    right knee replacement  . Cholecystectomy  12+yrs ago  . Finger surgery      right pointer  . Elbow surgery      right   . Colonoscopy    . Esophagogastroduodenoscopy    . Anterior lat lumbar fusion  12/02/2011    Procedure: ANTERIOR LATERAL LUMBAR FUSION 1 LEVEL;  Surgeon: DEustace Moore MD;  [facility];  Service: Neurosurgery;  Laterality: Left;  left lumbar three-four  . Lumbar fusion  03/16/2012    Dr JRonnald Ramp . Eye surgery Right   . Back surgery  2017     rods inserted L2     Current outpatient prescriptions:  .  aspirin EC 81 MG tablet, Take 81 mg by mouth daily., Disp: , Rfl:  .  atorvastatin (LIPITOR) 80 MG tablet, Take 40-80 mg by mouth daily. Take 80 mg on Monday, Wednesday and Friday , and 40 mg all other days., Disp: , Rfl:  .  Biotin 5000 MCG CAPS, Take 5,000 mcg by mouth daily., Disp: , Rfl:  .  Cholecalciferol (VITAMIN D3) 2000 units TABS, Take 2,000 Units by mouth daily., Disp: , Rfl:  .  docusate sodium (COLACE) 100 MG capsule, Take 200 mg  by mouth daily., Disp: , Rfl:  .  doxycycline (VIBRA-TABS) 100 MG tablet, Take 100 mg by mouth 2 (two) times daily., Disp: , Rfl:  .  finasteride (PROSCAR) 5 MG tablet, Take 5 mg by mouth daily., Disp: , Rfl:  .  glucosamine-chondroitin 500-400 MG tablet, Take 1 tablet by mouth daily., Disp: , Rfl:  .  Menthol, Topical Analgesic, (BIOFREEZE EX), Apply 1 application topically as needed (For pain.)., Disp: , Rfl:  .  Methylcellulose, Laxative, (CITRUCEL PO), Take 2 capsules by mouth daily., Disp: , Rfl:  .  Multiple Vitamin (MULTIVITAMIN WITH MINERALS) TABS, Take 1 tablet by mouth daily., Disp: , Rfl:  .  naproxen sodium (ANAPROX) 220 MG tablet, Take 220 mg by mouth 2 (two) times daily. , Disp: , Rfl:  .  Omega-3 Fatty Acids (FISH OIL) 1200 MG CAPS, Take 1,200 mg by mouth daily. , Disp: , Rfl:  .  omeprazole (PRILOSEC) 20  MG capsule, Take 20 mg by mouth daily., Disp: , Rfl:  .  pioglitazone-metformin (ACTOPLUS MET) 15-500 MG per tablet, Take 1 tablet by mouth daily. , Disp: , Rfl:  .  Saw Palmetto, Serenoa repens, (SAW PALMETTO PO), Take 1 capsule by mouth daily., Disp: , Rfl:  .  vitamin B-12 (CYANOCOBALAMIN) 1000 MCG tablet, Take 1,000 mcg by mouth daily., Disp: , Rfl:    No Known Allergies  Social History  Substance Use Topics  . Smoking status: Former Smoker -- 1.50 packs/day for 30 years    Types: Cigarettes    Quit date: 03/16/1985  . Smokeless tobacco: Never Used     Comment: quit 25+yrs ago  . Alcohol Use: Yes     Comment: rarely      Review of Systems  Constitutional: Negative.   HENT: Negative.   Eyes: Negative.   Respiratory: Negative.   Cardiovascular: Negative.   Gastrointestinal: Positive for constipation. Negative for heartburn, nausea, vomiting, abdominal pain, diarrhea, blood in stool and melena.  Genitourinary: Positive for frequency. Negative for dysuria, urgency, hematuria and flank pain.  Musculoskeletal: Positive for back pain and joint pain. Negative for myalgias, falls and neck pain.  Skin: Negative.   Neurological: Negative.   Endo/Heme/Allergies: Negative.   Psychiatric/Behavioral: Negative.     Objective:  Physical Exam  Constitutional: He is oriented to person, place, and time. He appears well-developed and well-nourished. No distress.  HENT:  Head: Normocephalic and atraumatic.  Right Ear: External ear normal.  Left Ear: External ear normal.  Nose: Nose normal.  Mouth/Throat: Oropharynx is clear and moist.  Eyes: Conjunctivae and EOM are normal.  Neck: Normal range of motion. Neck supple.  Cardiovascular: Normal rate, regular rhythm, normal heart sounds and intact distal pulses.   No murmur heard. Respiratory: Effort normal and breath sounds normal. No respiratory distress. He has no wheezes.  GI: Soft. Bowel sounds are normal. He exhibits no distension.  There is no tenderness.  Musculoskeletal:  Evaluation of his left knee shows no effusion. He has got a tiny bit of varus. His range of motion is 0 to 125 degrees. He is tender medially with no lateral tenderness or instability. He has no crepitus on range of motion. Hip exam is normal.  Neurological: He is alert and oriented to person, place, and time. He has normal strength and normal reflexes. No sensory deficit.  Skin: No rash noted. He is not diaphoretic. No erythema.  Psychiatric: He has a normal mood and affect. His behavior is normal.    Vitals  Weight: 210 lb Height: 66in Body Surface Area: 2.04 m Body Mass Index: 33.89 kg/m  Pulse: 68 (Regular)  BP: 116/70 (Sitting, Left Arm, Standard)  Imaging Review Plain radiographs demonstrate severe degenerative joint disease of the left knee(s). The overall alignment ismild varus. The bone quality appears to be good for age and reported activity level.  Assessment/Plan:  End stage primary osteoarthritis, left knee   The patient history, physical examination, clinical judgment of the provider and imaging studies are consistent with end stage degenerative joint disease of the left knee(s) and unicompartmental knee arthroplasty is deemed medically necessary. The treatment options including medical management, injection therapy arthroscopy and arthroplasty were discussed at length. The risks and benefits of unicompartmental knee arthroplasty were presented and reviewed. The risks due to aseptic loosening, infection, stiffness, patella tracking problems, thromboembolic complications and other imponderables were discussed. The patient acknowledged the explanation, agreed to proceed with the plan and consent was signed. Patient is being admitted for inpatient treatment for surgery, pain control, PT, OT, prophylactic antibiotics, VTE prophylaxis, progressive ambulation and ADL's and discharge planning. The patient is planning to be discharged  home with home health services    PCP: Chevis Pretty, PA-C

## 2015-07-30 ENCOUNTER — Encounter (HOSPITAL_COMMUNITY)
Admission: RE | Disposition: A | Discharge status: Home / Self Care | Payer: Self-pay | Source: Ambulatory Visit | Attending: Orthopedic Surgery

## 2015-07-30 ENCOUNTER — Observation Stay (HOSPITAL_COMMUNITY)
Admission: RE | Admit: 2015-07-30 | Discharge: 2015-07-31 | Disposition: A | Discharge status: Home / Self Care | Payer: Medicare Other | Source: Ambulatory Visit | Attending: Orthopedic Surgery | Admitting: Orthopedic Surgery

## 2015-07-30 ENCOUNTER — Encounter (HOSPITAL_COMMUNITY): Payer: Self-pay | Admitting: *Deleted

## 2015-07-30 ENCOUNTER — Ambulatory Visit (HOSPITAL_COMMUNITY): Payer: Medicare Other | Admitting: Certified Registered Nurse Anesthetist

## 2015-07-30 DIAGNOSIS — Z79899 Other long term (current) drug therapy: Secondary | ICD-10-CM | POA: Insufficient documentation

## 2015-07-30 DIAGNOSIS — M25762 Osteophyte, left knee: Secondary | ICD-10-CM | POA: Insufficient documentation

## 2015-07-30 DIAGNOSIS — Z87891 Personal history of nicotine dependence: Secondary | ICD-10-CM | POA: Diagnosis not present

## 2015-07-30 DIAGNOSIS — E119 Type 2 diabetes mellitus without complications: Secondary | ICD-10-CM | POA: Insufficient documentation

## 2015-07-30 DIAGNOSIS — K219 Gastro-esophageal reflux disease without esophagitis: Secondary | ICD-10-CM | POA: Insufficient documentation

## 2015-07-30 DIAGNOSIS — M25562 Pain in left knee: Secondary | ICD-10-CM | POA: Diagnosis present

## 2015-07-30 DIAGNOSIS — Z6833 Body mass index (BMI) 33.0-33.9, adult: Secondary | ICD-10-CM | POA: Insufficient documentation

## 2015-07-30 DIAGNOSIS — G4733 Obstructive sleep apnea (adult) (pediatric): Secondary | ICD-10-CM | POA: Insufficient documentation

## 2015-07-30 DIAGNOSIS — T8482XA Fibrosis due to internal orthopedic prosthetic devices, implants and grafts, initial encounter: Secondary | ICD-10-CM | POA: Diagnosis present

## 2015-07-30 DIAGNOSIS — Z7982 Long term (current) use of aspirin: Secondary | ICD-10-CM | POA: Insufficient documentation

## 2015-07-30 DIAGNOSIS — M1712 Unilateral primary osteoarthritis, left knee: Principal | ICD-10-CM | POA: Insufficient documentation

## 2015-07-30 DIAGNOSIS — Z791 Long term (current) use of non-steroidal anti-inflammatories (NSAID): Secondary | ICD-10-CM | POA: Insufficient documentation

## 2015-07-30 DIAGNOSIS — Z7984 Long term (current) use of oral hypoglycemic drugs: Secondary | ICD-10-CM | POA: Insufficient documentation

## 2015-07-30 DIAGNOSIS — Z9989 Dependence on other enabling machines and devices: Secondary | ICD-10-CM | POA: Diagnosis not present

## 2015-07-30 DIAGNOSIS — Z96651 Presence of right artificial knee joint: Secondary | ICD-10-CM | POA: Diagnosis not present

## 2015-07-30 DIAGNOSIS — E669 Obesity, unspecified: Secondary | ICD-10-CM | POA: Diagnosis not present

## 2015-07-30 DIAGNOSIS — E785 Hyperlipidemia, unspecified: Secondary | ICD-10-CM | POA: Diagnosis not present

## 2015-07-30 HISTORY — PX: PARTIAL KNEE ARTHROPLASTY: SHX2174

## 2015-07-30 LAB — GLUCOSE, CAPILLARY
Glucose-Capillary: 147 mg/dL — ABNORMAL HIGH (ref 65–99)
Glucose-Capillary: 252 mg/dL — ABNORMAL HIGH (ref 65–99)
Glucose-Capillary: 160 mg/dL — ABNORMAL HIGH (ref 65–99)

## 2015-07-30 SURGERY — ARTHROPLASTY, KNEE, UNICOMPARTMENTAL
Anesthesia: General | Site: Knee | Laterality: Left

## 2015-07-30 MED ORDER — SODIUM CHLORIDE 0.9 % IJ SOLN
INTRAMUSCULAR | Status: DC | PRN
  Administered 2015-07-30: 30 mL

## 2015-07-30 MED ORDER — SODIUM CHLORIDE 0.9 % IV SOLN
INTRAVENOUS | Status: DC
  Administered 2015-07-30: 100 mL/h via INTRAVENOUS

## 2015-07-30 MED ORDER — LACTATED RINGERS IV SOLN
INTRAVENOUS | Status: DC
  Administered 2015-07-30: 1000 mL via INTRAVENOUS

## 2015-07-30 MED ORDER — PIOGLITAZONE HCL-METFORMIN HCL 15-500 MG PO TABS: 1.0000 | ORAL_TABLET | Freq: Every day | ORAL | Status: DC

## 2015-07-30 MED ORDER — METOCLOPRAMIDE HCL 5 MG PO TABS: 5.0000 mg | ORAL_TABLET | Freq: Three times a day (TID) | ORAL | Status: DC | PRN

## 2015-07-30 MED ORDER — LIDOCAINE HCL (CARDIAC) 20 MG/ML IV SOLN
INTRAVENOUS | Status: AC
  Filled 2015-07-30: qty 5

## 2015-07-30 MED ORDER — ACETAMINOPHEN 650 MG RE SUPP: 650.0000 mg | Freq: Four times a day (QID) | RECTAL | Status: DC | PRN

## 2015-07-30 MED ORDER — ACETAMINOPHEN 325 MG PO TABS: 650.0000 mg | ORAL_TABLET | Freq: Four times a day (QID) | ORAL | Status: DC | PRN

## 2015-07-30 MED ORDER — MIDAZOLAM HCL 2 MG/2ML IJ SOLN
INTRAMUSCULAR | Status: AC
  Filled 2015-07-30: qty 2

## 2015-07-30 MED ORDER — PROPOFOL 10 MG/ML IV BOLUS
INTRAVENOUS | Status: DC | PRN
  Administered 2015-07-30: 30 mg via INTRAVENOUS
  Administered 2015-07-30: 170 mg via INTRAVENOUS

## 2015-07-30 MED ORDER — METHOCARBAMOL 500 MG PO TABS
500.0000 mg | ORAL_TABLET | Freq: Four times a day (QID) | ORAL | Status: DC | PRN
  Administered 2015-07-30 – 2015-07-31 (×3): 500 mg via ORAL
  Filled 2015-07-30 (×3): qty 1

## 2015-07-30 MED ORDER — ROCURONIUM BROMIDE 100 MG/10ML IV SOLN
INTRAVENOUS | Status: DC | PRN
  Administered 2015-07-30: 40 mg via INTRAVENOUS

## 2015-07-30 MED ORDER — CEFAZOLIN SODIUM-DEXTROSE 2-4 GM/100ML-% IV SOLN
INTRAVENOUS | Status: AC
  Filled 2015-07-30: qty 100

## 2015-07-30 MED ORDER — ACETAMINOPHEN 10 MG/ML IV SOLN
INTRAVENOUS | Status: AC
  Filled 2015-07-30: qty 100

## 2015-07-30 MED ORDER — CHLORHEXIDINE GLUCONATE 4 % EX LIQD: 60.0000 mL | Freq: Once | CUTANEOUS | Status: DC

## 2015-07-30 MED ORDER — POLYETHYLENE GLYCOL 3350 17 G PO PACK: 17.0000 g | PACK | Freq: Every day | ORAL | Status: DC | PRN

## 2015-07-30 MED ORDER — FLEET ENEMA 7-19 GM/118ML RE ENEM: 1.0000 | ENEMA | Freq: Once | RECTAL | Status: DC | PRN

## 2015-07-30 MED ORDER — RIVAROXABAN 10 MG PO TABS
10.0000 mg | ORAL_TABLET | Freq: Every day | ORAL | Status: DC
  Administered 2015-07-31: 10 mg via ORAL
  Filled 2015-07-30: qty 1

## 2015-07-30 MED ORDER — BUPIVACAINE LIPOSOME 1.3 % IJ SUSP
20.0000 mL | Freq: Once | INTRAMUSCULAR | Status: DC
  Filled 2015-07-30: qty 20

## 2015-07-30 MED ORDER — FINASTERIDE 5 MG PO TABS
5.0000 mg | ORAL_TABLET | Freq: Every day | ORAL | Status: DC
  Administered 2015-07-30 – 2015-07-31 (×2): 5 mg via ORAL
  Filled 2015-07-30 (×2): qty 1

## 2015-07-30 MED ORDER — TRANEXAMIC ACID 1000 MG/10ML IV SOLN
1000.0000 mg | INTRAVENOUS | Status: AC
  Administered 2015-07-30: 1000 mg via INTRAVENOUS
  Filled 2015-07-30: qty 10

## 2015-07-30 MED ORDER — FENTANYL CITRATE (PF) 100 MCG/2ML IJ SOLN
INTRAMUSCULAR | Status: AC
  Filled 2015-07-30: qty 2

## 2015-07-30 MED ORDER — METHOCARBAMOL 1000 MG/10ML IJ SOLN
500.0000 mg | Freq: Four times a day (QID) | INTRAVENOUS | Status: DC | PRN
  Administered 2015-07-30: 500 mg via INTRAVENOUS
  Filled 2015-07-30: qty 550
  Filled 2015-07-30: qty 5

## 2015-07-30 MED ORDER — SUGAMMADEX SODIUM 200 MG/2ML IV SOLN
INTRAVENOUS | Status: AC
  Filled 2015-07-30: qty 2

## 2015-07-30 MED ORDER — ACETAMINOPHEN 10 MG/ML IV SOLN
1000.0000 mg | Freq: Once | INTRAVENOUS | Status: AC
  Administered 2015-07-30: 1000 mg via INTRAVENOUS

## 2015-07-30 MED ORDER — PIOGLITAZONE HCL 15 MG PO TABS
15.0000 mg | ORAL_TABLET | Freq: Every day | ORAL | Status: DC
  Administered 2015-07-31: 15 mg via ORAL
  Filled 2015-07-30: qty 1

## 2015-07-30 MED ORDER — LIDOCAINE HCL (CARDIAC) 20 MG/ML IV SOLN
INTRAVENOUS | Status: DC | PRN
  Administered 2015-07-30: 100 mg via INTRAVENOUS

## 2015-07-30 MED ORDER — ATORVASTATIN CALCIUM 20 MG PO TABS
80.0000 mg | ORAL_TABLET | ORAL | Status: DC
  Administered 2015-07-30: 80 mg via ORAL
  Filled 2015-07-30: qty 4

## 2015-07-30 MED ORDER — ATORVASTATIN CALCIUM 20 MG PO TABS
40.0000 mg | ORAL_TABLET | ORAL | Status: DC
  Administered 2015-07-31: 40 mg via ORAL

## 2015-07-30 MED ORDER — DIPHENHYDRAMINE HCL 12.5 MG/5ML PO ELIX
12.5000 mg | ORAL_SOLUTION | ORAL | Status: DC | PRN
  Administered 2015-07-31: 12.5 mg via ORAL
  Filled 2015-07-30: qty 5

## 2015-07-30 MED ORDER — PROPOFOL 10 MG/ML IV BOLUS
INTRAVENOUS | Status: AC
  Filled 2015-07-30: qty 20

## 2015-07-30 MED ORDER — ONDANSETRON HCL 4 MG PO TABS: 4.0000 mg | ORAL_TABLET | Freq: Four times a day (QID) | ORAL | Status: DC | PRN

## 2015-07-30 MED ORDER — OXYCODONE HCL 5 MG PO TABS
5.0000 mg | ORAL_TABLET | ORAL | Status: DC | PRN
  Administered 2015-07-30 – 2015-07-31 (×8): 10 mg via ORAL
  Filled 2015-07-30 (×8): qty 2

## 2015-07-30 MED ORDER — BISACODYL 10 MG RE SUPP: 10.0000 mg | Freq: Every day | RECTAL | Status: DC | PRN

## 2015-07-30 MED ORDER — MENTHOL 3 MG MT LOZG: 1.0000 | LOZENGE | OROMUCOSAL | Status: DC | PRN

## 2015-07-30 MED ORDER — METOCLOPRAMIDE HCL 5 MG/ML IJ SOLN: 5.0000 mg | Freq: Three times a day (TID) | INTRAMUSCULAR | Status: DC | PRN

## 2015-07-30 MED ORDER — CEFAZOLIN SODIUM-DEXTROSE 2-4 GM/100ML-% IV SOLN
2.0000 g | INTRAVENOUS | Status: AC
  Administered 2015-07-30: 2 g via INTRAVENOUS

## 2015-07-30 MED ORDER — SODIUM CHLORIDE 0.9 % IJ SOLN
INTRAMUSCULAR | Status: AC
  Filled 2015-07-30: qty 50

## 2015-07-30 MED ORDER — ATROPINE SULFATE 0.4 MG/ML IJ SOLN
INTRAMUSCULAR | Status: AC
  Filled 2015-07-30: qty 1

## 2015-07-30 MED ORDER — ONDANSETRON HCL 4 MG/2ML IJ SOLN
INTRAMUSCULAR | Status: AC
  Filled 2015-07-30: qty 2

## 2015-07-30 MED ORDER — METFORMIN HCL 500 MG PO TABS
500.0000 mg | ORAL_TABLET | Freq: Every day | ORAL | Status: DC
  Administered 2015-07-31: 500 mg via ORAL
  Filled 2015-07-30: qty 1

## 2015-07-30 MED ORDER — EPHEDRINE SULFATE 50 MG/ML IJ SOLN
INTRAMUSCULAR | Status: AC
  Filled 2015-07-30: qty 1

## 2015-07-30 MED ORDER — TRANEXAMIC ACID 1000 MG/10ML IV SOLN
1000.0000 mg | Freq: Once | INTRAVENOUS | Status: AC
  Administered 2015-07-30: 1000 mg via INTRAVENOUS
  Filled 2015-07-30: qty 10

## 2015-07-30 MED ORDER — ACETAMINOPHEN 500 MG PO TABS
1000.0000 mg | ORAL_TABLET | Freq: Four times a day (QID) | ORAL | Status: AC
Start: 2015-07-30 — End: 2015-07-31
  Administered 2015-07-30 – 2015-07-31 (×4): 1000 mg via ORAL
  Filled 2015-07-30 (×4): qty 2

## 2015-07-30 MED ORDER — DEXAMETHASONE SODIUM PHOSPHATE 10 MG/ML IJ SOLN
10.0000 mg | Freq: Once | INTRAMUSCULAR | Status: AC
  Administered 2015-07-31: 10 mg via INTRAVENOUS
  Filled 2015-07-30: qty 1

## 2015-07-30 MED ORDER — MORPHINE SULFATE (PF) 2 MG/ML IV SOLN
1.0000 mg | Freq: Once | INTRAVENOUS | Status: AC
  Administered 2015-07-30: 1 mg via INTRAVENOUS
  Filled 2015-07-30: qty 1

## 2015-07-30 MED ORDER — PHENOL 1.4 % MT LIQD
1.0000 | OROMUCOSAL | Status: DC | PRN
  Filled 2015-07-30: qty 177

## 2015-07-30 MED ORDER — DOCUSATE SODIUM 100 MG PO CAPS
100.0000 mg | ORAL_CAPSULE | Freq: Two times a day (BID) | ORAL | Status: DC
  Administered 2015-07-30 – 2015-07-31 (×3): 100 mg via ORAL
  Filled 2015-07-30 (×3): qty 1

## 2015-07-30 MED ORDER — DEXAMETHASONE SODIUM PHOSPHATE 10 MG/ML IJ SOLN
10.0000 mg | Freq: Once | INTRAMUSCULAR | Status: AC
  Administered 2015-07-30: 10 mg via INTRAVENOUS

## 2015-07-30 MED ORDER — SODIUM CHLORIDE 0.9 % IR SOLN
Status: DC | PRN
  Administered 2015-07-30: 1000 mL

## 2015-07-30 MED ORDER — ROCURONIUM BROMIDE 100 MG/10ML IV SOLN
INTRAVENOUS | Status: AC
  Filled 2015-07-30: qty 1

## 2015-07-30 MED ORDER — SODIUM CHLORIDE 0.9 % IJ SOLN
INTRAMUSCULAR | Status: AC
  Filled 2015-07-30: qty 10

## 2015-07-30 MED ORDER — ONDANSETRON HCL 4 MG/2ML IJ SOLN
INTRAMUSCULAR | Status: DC | PRN
  Administered 2015-07-30: 4 mg via INTRAVENOUS

## 2015-07-30 MED ORDER — FENTANYL CITRATE (PF) 250 MCG/5ML IJ SOLN
INTRAMUSCULAR | Status: AC
  Filled 2015-07-30: qty 5

## 2015-07-30 MED ORDER — MORPHINE SULFATE (PF) 2 MG/ML IV SOLN
1.0000 mg | INTRAVENOUS | Status: DC | PRN
  Administered 2015-07-30 – 2015-07-31 (×3): 1 mg via INTRAVENOUS
  Filled 2015-07-30 (×3): qty 1

## 2015-07-30 MED ORDER — INSULIN ASPART 100 UNIT/ML ~~LOC~~ SOLN
0.0000 [IU] | Freq: Three times a day (TID) | SUBCUTANEOUS | Status: DC
  Administered 2015-07-30: 8 [IU] via SUBCUTANEOUS
  Administered 2015-07-31: 2 [IU] via SUBCUTANEOUS
  Filled 2015-07-30: qty 1

## 2015-07-30 MED ORDER — PANTOPRAZOLE SODIUM 40 MG PO TBEC
40.0000 mg | DELAYED_RELEASE_TABLET | Freq: Every day | ORAL | Status: DC
  Administered 2015-07-31: 40 mg via ORAL
  Filled 2015-07-30: qty 1

## 2015-07-30 MED ORDER — BUPIVACAINE-EPINEPHRINE (PF) 0.5% -1:200000 IJ SOLN
INTRAMUSCULAR | Status: AC
  Filled 2015-07-30: qty 30

## 2015-07-30 MED ORDER — SUCCINYLCHOLINE CHLORIDE 20 MG/ML IJ SOLN
INTRAMUSCULAR | Status: DC | PRN
  Administered 2015-07-30 (×2): 100 mg via INTRAVENOUS

## 2015-07-30 MED ORDER — MIDAZOLAM HCL 5 MG/5ML IJ SOLN
INTRAMUSCULAR | Status: DC | PRN
  Administered 2015-07-30: 0.5 mg via INTRAVENOUS
  Administered 2015-07-30: 1.5 mg via INTRAVENOUS

## 2015-07-30 MED ORDER — FENTANYL CITRATE (PF) 100 MCG/2ML IJ SOLN
25.0000 ug | INTRAMUSCULAR | Status: DC | PRN
  Administered 2015-07-30 (×3): 50 ug via INTRAVENOUS

## 2015-07-30 MED ORDER — BUPIVACAINE HCL (PF) 0.25 % IJ SOLN
INTRAMUSCULAR | Status: AC
  Filled 2015-07-30: qty 30

## 2015-07-30 MED ORDER — SUGAMMADEX SODIUM 200 MG/2ML IV SOLN
INTRAVENOUS | Status: DC | PRN
  Administered 2015-07-30: 200 mg via INTRAVENOUS

## 2015-07-30 MED ORDER — CEFAZOLIN SODIUM-DEXTROSE 2-4 GM/100ML-% IV SOLN
2.0000 g | Freq: Four times a day (QID) | INTRAVENOUS | Status: AC
  Administered 2015-07-30 (×2): 2 g via INTRAVENOUS
  Filled 2015-07-30 (×2): qty 100

## 2015-07-30 MED ORDER — DEXAMETHASONE SODIUM PHOSPHATE 10 MG/ML IJ SOLN
INTRAMUSCULAR | Status: AC
  Filled 2015-07-30: qty 1

## 2015-07-30 MED ORDER — ONDANSETRON HCL 4 MG/2ML IJ SOLN: 4.0000 mg | Freq: Four times a day (QID) | INTRAMUSCULAR | Status: DC | PRN

## 2015-07-30 MED ORDER — BUPIVACAINE LIPOSOME 1.3 % IJ SUSP
INTRAMUSCULAR | Status: DC | PRN
  Administered 2015-07-30: 50 mL

## 2015-07-30 MED ORDER — TRAMADOL HCL 50 MG PO TABS
50.0000 mg | ORAL_TABLET | Freq: Four times a day (QID) | ORAL | Status: DC | PRN
  Administered 2015-07-31: 100 mg via ORAL
  Filled 2015-07-30: qty 2

## 2015-07-30 MED ORDER — FENTANYL CITRATE (PF) 100 MCG/2ML IJ SOLN
INTRAMUSCULAR | Status: DC | PRN
  Administered 2015-07-30: 50 ug via INTRAVENOUS
  Administered 2015-07-30: 25 ug via INTRAVENOUS
  Administered 2015-07-30 (×2): 50 ug via INTRAVENOUS
  Administered 2015-07-30: 75 ug via INTRAVENOUS

## 2015-07-30 SURGICAL SUPPLY — 43 items
BAG DECANTER FOR FLEXI CONT (MISCELLANEOUS) IMPLANT
BAG ZIPLOCK 12X15 (MISCELLANEOUS) IMPLANT
BANDAGE ACE 6X5 VEL STRL LF (GAUZE/BANDAGES/DRESSINGS) ×3 IMPLANT
BLADE SAW RECIPROCATING 77.5 (BLADE) ×3 IMPLANT
BLADE SAW SGTL 13.0X1.19X90.0M (BLADE) ×3 IMPLANT
BOWL SMART MIX CTS (DISPOSABLE) ×3 IMPLANT
BUR OVAL CARBIDE 4.0 (BURR) ×3 IMPLANT
CAPT KNEE PARTIAL 2 ×2 IMPLANT
CEMENT HV SMART SET (Cement) ×3 IMPLANT
CLOSURE WOUND 1/2 X4 (GAUZE/BANDAGES/DRESSINGS) ×2
CLOTH BEACON ORANGE TIMEOUT ST (SAFETY) ×3 IMPLANT
CUFF TOURN SGL QUICK 34 (TOURNIQUET CUFF) ×2
CUFF TRNQT CYL 34X4X40X1 (TOURNIQUET CUFF) ×1 IMPLANT
DRSG ADAPTIC 3X8 NADH LF (GAUZE/BANDAGES/DRESSINGS) ×3 IMPLANT
DRSG PAD ABDOMINAL 8X10 ST (GAUZE/BANDAGES/DRESSINGS) ×3 IMPLANT
DURAPREP 26ML APPLICATOR (WOUND CARE) ×3 IMPLANT
ELECT REM PT RETURN 9FT ADLT (ELECTROSURGICAL) ×3
ELECTRODE REM PT RTRN 9FT ADLT (ELECTROSURGICAL) ×1 IMPLANT
EVACUATOR 1/8 PVC DRAIN (DRAIN) ×3 IMPLANT
GAUZE SPONGE 4X4 12PLY STRL (GAUZE/BANDAGES/DRESSINGS) ×3 IMPLANT
GLOVE BIO SURGEON STRL SZ7.5 (GLOVE) ×15 IMPLANT
GLOVE BIO SURGEON STRL SZ8 (GLOVE) ×3 IMPLANT
GLOVE BIOGEL PI IND STRL 8 (GLOVE) ×2 IMPLANT
GLOVE BIOGEL PI INDICATOR 8 (GLOVE) ×4
GOWN STRL REUS W/TWL LRG LVL3 (GOWN DISPOSABLE) ×9 IMPLANT
GOWN STRL REUS W/TWL XL LVL3 (GOWN DISPOSABLE) ×3 IMPLANT
HANDPIECE INTERPULSE COAX TIP (DISPOSABLE) ×3
IMMOBILIZER KNEE 20 (SOFTGOODS) ×3
IMMOBILIZER KNEE 20 THIGH 36 (SOFTGOODS) ×1 IMPLANT
KIT IMPL STRL TIB IPOLY IUNI ×1 IMPLANT
MANIFOLD NEPTUNE II (INSTRUMENTS) ×3 IMPLANT
PACK TOTAL KNEE CUSTOM (KITS) ×3 IMPLANT
PADDING CAST COTTON 6X4 STRL (CAST SUPPLIES) ×6 IMPLANT
POSITIONER SURGICAL ARM (MISCELLANEOUS) ×3 IMPLANT
SET HNDPC FAN SPRY TIP SCT (DISPOSABLE) ×1 IMPLANT
STRIP CLOSURE SKIN 1/2X4 (GAUZE/BANDAGES/DRESSINGS) ×4 IMPLANT
SUT MNCRL AB 4-0 PS2 18 (SUTURE) ×3 IMPLANT
SUT VIC AB 2-0 CT1 27 (SUTURE) ×6
SUT VIC AB 2-0 CT1 TAPERPNT 27 (SUTURE) ×3 IMPLANT
SUT VLOC 180 0 24IN GS25 (SUTURE) ×3 IMPLANT
SYR 50ML LL SCALE MARK (SYRINGE) IMPLANT
TRAY FOLEY W/METER SILVER 14FR (SET/KITS/TRAYS/PACK) IMPLANT
TRAY FOLEY W/METER SILVER 16FR (SET/KITS/TRAYS/PACK) IMPLANT

## 2015-07-30 NOTE — Anesthesia Procedure Notes (Addendum)
Procedure Name: Intubation Date/Time: 07/30/2015 10:03 AM Performed by: Epimenio SarinJARVELA, JOSHUA R Pre-anesthesia Checklist: Patient identified, Emergency Drugs available, Suction available, Patient being monitored and Timeout performed Patient Re-evaluated:Patient Re-evaluated prior to inductionOxygen Delivery Method: Circle system utilized Preoxygenation: Pre-oxygenation with 100% oxygen Intubation Type: IV induction Ventilation: Mask ventilation without difficulty and Oral airway inserted - appropriate to patient size Laryngoscope Size: Mac and 3 Grade View: Grade III Tube type: Oral Tube size: 7.5 mm Number of attempts: 3 Airway Equipment and Method: Stylet Placement Confirmation: ETT inserted through vocal cords under direct vision,  positive ETCO2 and breath sounds checked- equal and bilateral Secured at: 23 cm Tube secured with: Tape Dental Injury: Teeth and Oropharynx as per pre-operative assessment and Injury to lip  Difficulty Due To: Difficult Airway- due to immobile epiglottis and Difficult Airway- due to anterior larynx Future Recommendations: Recommend- induction with short-acting agent, and alternative techniques readily available Comments: MAC3, grade 3 view unable to lift epiglottis, MAC4 grade 3 view, Glidescope 4 blade initially grade 3 view, difficulty lifting epiglottis to view cords, after manipulating blade several times grade 2 view obtained and ETT easily passed through cords. Small laceration to upper lip, petroleum gauze applied.    Anesthesia Regional Block:  Adductor canal block  Pre-Anesthetic Checklist: ,, timeout performed, Correct Patient, Correct Site, Correct Laterality, Correct Procedure, Correct Position, site marked, Risks and benefits discussed,  Surgical consent,  Pre-op evaluation,  At surgeon's request and post-op pain management  Laterality: Left  Prep: chloraprep       Needles:   Needle Type: Stimulator Needle - 80          Additional  Needles:  Procedures: Doppler guided and ultrasound guided (picture in chart) Adductor canal block Narrative:  Start time: 07/30/2015 11:50 AM End time: 07/30/2015 12:00 PM Injection made incrementally with aspirations every 5 mL.  Performed by: Personally  Anesthesiologist: Judie PetitEDWARDS, Jayra Choyce

## 2015-07-30 NOTE — Progress Notes (Signed)
PT Cancellation Note  Patient Details Name: Ronn MelenaGary P Ansell MRN: [id number] DOB: 03-21-1940   Cancelled Treatment:    Reason Eval/Treat Not Completed: Pain limiting ability to participate   Rada HayHill, Cosimo Schertzer Elizabeth 07/30/2015, 4:14 PM

## 2015-07-30 NOTE — Op Note (Signed)
OPERATIVE REPORT  PREOPERATIVE DIAGNOSIS: Medial compartment osteoarthritis, Left knee  POSTOPERATIVE DIAGNOSIS: Medial compartment osteoarthritis, Left knee  PROCEDURE:Left knee medial unicompartmental arthroplasty.   SURGEON: Ollen GrossFrank Encarnacion Scioneaux, MD   ASSISTANT: Leilani AbleSteve Chabon, PA-C  ANESTHESIA:  Adductor canal block plus general.   ESTIMATED BLOOD LOSS: Minimal.   DRAINS: Hemovac x1.   TOURNIQUET TIME: 29 minutes at 300 mmHg.   COMPLICATIONS: None.   CONDITION: Stable to recovery.   BRIEF CLINICAL NOTE:[name] is a 75 y.o. male, who has  significant isolated medial compartment arthritis of the Left knee. He has had nonoperative management including injections. He has had  cortisone and viscous supplements. Unfortunately, the pain persists.  Radiograph showed isolated medial compartment bone-on-bone arthritis  with normal-appearing patellofemoral and lateral compartments. He  presents now for left knee unicompartmental arthroplasty.   PROCEDURE IN DETAIL: After successful administration of  Adductor canal block plus General anesthetic, a tourniquet was placed high on the  Left thigh and left lower extremity prepped and draped in usual sterile fashion. Extremity was wrapped in an Esmarch, knee flexed, and tourniquet inflated to 300 mmHg. A midline incision was made with a 10 blade through subcutaneous  tissue to the extensor mechanism. A fresh blade was used to make a  medial parapatellar arthrotomy. Soft tissue on the proximal medial  tibia subperiosteally elevated to the joint line with a knife and into  the semimembranosus bursa with a Cobb elevator. The patella was  subluxed laterally, and the knee flexed 90 degrees. The ACL was intact.  The marginal osteophytes on the medial femur and tibia were removed with  a rongeur. The medial meniscus was also removed. The femoral cutting  block where the conformis unicompartmental knee system was placed along   the femur. There was excellent fit. I traced the outline. We then  removed any remaining cartilage within this outline. We then placed the  cutting block again and pinned in position. The posterior femoral cut  was made, it was approximately 6.5 mm. The lug holes for the femoral  component were then drilled through the cutting block. The cutting  block was subsequently removed. We then utilized the high speed burr to  create a small trough at the superior aspect of the components that of  the inset and would not overhang the cartilage. The trial was placed,  it had excellent fit. The trial was subsequently removed.       The trial was placed again and the B chip was placed. There was  excellent balance throughout full motion. Also with excellent fit on  her tibia. This was removed as was the femoral trial. A curette was  used to remove any remaining cartilage from the tibia. The tibial  cutting block was then placed and there was a perfect fit on the tibial  surface. The appropriate slope was placed and it was pinned in  position. The reciprocating saw was used to make the central cut and  then the oscillating saw used to make the horizontal cut. The bone  fragment was then removed. The tibial trial was placed and had perfect  fit on the tibia. We then drilled the 2 lug holes and did the keel punch.  We then placed tibia trial femur, and a 6 mm trial insert. There was  excellent stability throughout full range of motion and no impingement.  The trial was then removed. We drilled small holes in the distal  femur in order to create more conduits for  the cement. The cut bone  surfaces were thoroughly irrigated with pulsatile lavage while the  cement was mixed on the back table. We then cemented the tibial  component into place, impacted it and removed the extruded cement. The  same was done for the femoral component. Trial 6-mm inserts placed,  knee held in full extension, and all extruded  cement removed. While the  cement was hardening, I injected the extensor mechanism, periosteum of  the femur and subcu tissues, a total of 20 mL of Exparel mixed with 30  mL of saline.  When the cement had fully hardened,  then the permanent polyethylene was placed in tibial tray. There was  excellent stability throughout full range of motion with no lift off the  component and no evidence of any impingement. Wound was copiously  irrigated with saline solution, and the arthrotomy closed over a Hemovac  drain with a running #1 V-Loc suture. The subcutaneous was closed with  interrupted 2-0 Vicryl and subcuticular running 4-0 Monocryl. The drain  was hooked to suction. Incision cleaned and dried and Steri-Strips and  a bulky sterile dressing applied. The tourniquet was released after a  total time of 29 minutes. This was done after closing the extensor  mechanism. The wound was closed and a bulky sterile dressing was  applied. He was awakened and  transported to recovery room in stable condition.  Please note that a surgical assistant was a medical necessity for this  procedure in order to perform it in a safe and expeditious manner.  Assistance was necessary for retracting vital ligaments, neurovascular  structures, as well as for proper positioning of the limb to allow for  appropriate bone cuts and appropriate placement of the prosthesis.    [name] Rankin Sokhna Christoph, MD

## 2015-07-30 NOTE — Care Management Note (Signed)
Case Management Note  Patient Details  Name: [name] MRN: [id number] Date of Birth: 01/09/1941  Subjective/Objective:                  Left knee medial unicompartmental arthroplasty.  Action/Plan: Discharge planning Expected Discharge Date:  07/31/15               Expected Discharge Plan:  Home w Home Health Services  In-House Referral:     Discharge planning Services  CM Consult  Post Acute Care Choice:    Choice offered to:  Patient  DME Arranged:  N/A DME Agency:  NA  HH Arranged:  PT HH Agency:  [facility] (now [facility])  Status of Service:  Completed, signed off  If discussed at Long Length of Stay Meetings, dates discussed:    Additional Comments: Cm met with pt in room to offer choice of home health agency. Pt chooses [facility] to render HHPT.  Referral given to [facility] rep, tim.  Pt has all DME needed at home and denies need for additional DMe.  No other Cm needs were communicated. Jeffries, Sarah Christine, RN 07/30/2015, 1:18 PM  

## 2015-07-30 NOTE — Care Management Obs Status (Signed)
MEDICARE OBSERVATION STATUS NOTIFICATION   Patient Details  Name: [name] MRN: [id number] Date of Birth: 27-Jun-1940   Medicare Observation Status Notification Given:  Yes MOON GIVEN AND SIGNED; copy given to pt; original given to CMA to be scanned into medical record. Yves DillJeffries, [name] Demario Christine, RN 07/30/2015, 1:17 PM

## 2015-07-30 NOTE — Progress Notes (Signed)
Assisted Dr. Judie Petitharlene Edwards with left, ultrasound guided, adductor canal block. Side rails up, monitors on throughout procedure. See vital signs in flow sheet. Tolerated Procedure well.

## 2015-07-30 NOTE — Anesthesia Preprocedure Evaluation (Addendum)
Anesthesia Evaluation  Patient identified by MRN, date of birth, ID band Patient awake    Reviewed: Allergy & Precautions, NPO status , Patient's Chart, lab work & pertinent test results  Airway Mallampati: II  TM Distance: >3 FB Neck ROM: Full    Dental   Pulmonary sleep apnea , former smoker,    breath sounds clear to auscultation       Cardiovascular negative cardio ROS   Rhythm:Regular Rate:Normal     Neuro/Psych    GI/Hepatic Neg liver ROS, GERD  ,  Endo/Other  diabetes  Renal/GU negative Renal ROS     Musculoskeletal   Abdominal   Peds  Hematology   Anesthesia Other Findings   Reproductive/Obstetrics                       Anesthesia Physical Anesthesia Plan  ASA: III  Anesthesia Plan: General   Post-op Pain Management: GA combined w/ Regional for post-op pain   Induction: Intravenous  Airway Management Planned: Oral ETT  Additional Equipment:   Intra-op Plan:   Post-operative Plan: Extubation in OR  Informed Consent: I have reviewed the patients History and Physical, chart, labs and discussed the procedure including the risks, benefits and alternatives for the proposed anesthesia with the patient or authorized representative who has indicated his/her understanding and acceptance.   Dental advisory given  Plan Discussed with: CRNA, Anesthesiologist and Surgeon  Anesthesia Plan Comments:        Anesthesia Quick Evaluation

## 2015-07-30 NOTE — Anesthesia Postprocedure Evaluation (Signed)
Anesthesia Post Note  Patient: [name] MelenaGary P Hoefer  Procedure(s) Performed: Procedure(s) (LRB): UNICOMPARTMENTAL LEFT KNEE MEDIAL (Left)  Patient location during evaluation: PACU Anesthesia Type: General Level of consciousness: awake Vital Signs Assessment: post-procedure vital signs reviewed and stable Respiratory status: spontaneous breathing Cardiovascular status: stable Anesthetic complications: no    Last Vitals:  Filed Vitals:   07/30/15 1315 07/30/15 1413  BP: 136/79 136/67  Pulse: 75 76  Temp: 36.6 C 36.4 C  Resp: 14 14    Last Pain:  Filed Vitals:   07/30/15 1414  PainSc: 8                  EDWARDS,Huy Majid

## 2015-07-30 NOTE — Transfer of Care (Signed)
Immediate Anesthesia Transfer of Care Note  Patient: [name] MelenaGary P Drennen  Procedure(s) Performed: Procedure(s): UNICOMPARTMENTAL LEFT KNEE MEDIAL (Left)  Patient Location: PACU  Anesthesia Type:General  Level of Consciousness:  sedated, patient cooperative and responds to stimulation  Airway & Oxygen Therapy:Patient Spontanous Breathing and Patient connected to face mask oxgen  Post-op Assessment:  Report given to PACU RN and Post -op Vital signs reviewed and stable  Post vital signs:  Reviewed and stable  Last Vitals:  Filed Vitals:   07/30/15 0740 07/30/15 1124  BP: 120/68 165/82  Pulse: 64   Temp: 36.6 C 36.9 C  Resp: 18 18    Complications: No apparent anesthesia complications

## 2015-07-30 NOTE — Interval H&P Note (Signed)
History and Physical Interval Note:  07/30/2015 9:31 AM  [name]  has presented today for surgery, with the diagnosis of Medial compartment osteoarthritis left knee  The various methods of treatment have been discussed with the patient and family. After consideration of risks, benefits and other options for treatment, the patient has consented to  Procedure(s): UNICOMPARTMENTAL LEFT KNEE MEDIAL (Left) as a surgical intervention .  The patient's history has been reviewed, patient examined, no change in status, stable for surgery.  I have reviewed the patient's chart and labs.  Questions were answered to the patient's satisfaction.     Loanne DrillingALUISIO,[name]

## 2015-07-31 ENCOUNTER — Encounter (HOSPITAL_COMMUNITY): Payer: Self-pay | Admitting: Orthopedic Surgery

## 2015-07-31 DIAGNOSIS — M1712 Unilateral primary osteoarthritis, left knee: Secondary | ICD-10-CM | POA: Diagnosis not present

## 2015-07-31 LAB — CBC
HCT: 35.6 % — ABNORMAL LOW (ref 39.0–52.0)
Hemoglobin: 12.3 g/dL — ABNORMAL LOW (ref 13.0–17.0)
MCH: 33.9 pg (ref 26.0–34.0)
MCHC: 34.6 g/dL (ref 30.0–36.0)
MCV: 98.1 fL (ref 78.0–100.0)
Platelets: 203 10*3/uL (ref 150–400)
RBC: 3.63 MIL/uL — ABNORMAL LOW (ref 4.22–5.81)
RDW: 13.4 % (ref 11.5–15.5)
WBC: 14.3 10*3/uL — ABNORMAL HIGH (ref 4.0–10.5)

## 2015-07-31 LAB — BASIC METABOLIC PANEL
Anion gap: 5 (ref 5–15)
BUN: 18 mg/dL (ref 6–20)
CO2: 25 mmol/L (ref 22–32)
Calcium: 8.4 mg/dL — ABNORMAL LOW (ref 8.9–10.3)
Chloride: 107 mmol/L (ref 101–111)
Creatinine, Ser: 0.82 mg/dL (ref 0.61–1.24)
GFR calc Af Amer: >60 mL/min (ref >60)
GFR calc non Af Amer: >60 mL/min (ref >60)
Glucose, Bld: 149 mg/dL — ABNORMAL HIGH (ref 65–99)
Potassium: 4.1 mmol/L (ref 3.5–5.1)
Sodium: 137 mmol/L (ref 135–145)

## 2015-07-31 LAB — GLUCOSE, CAPILLARY
Glucose-Capillary: 144 mg/dL — ABNORMAL HIGH (ref 65–99)
Glucose-Capillary: 167 mg/dL — ABNORMAL HIGH (ref 65–99)

## 2015-07-31 MED ORDER — OXYCODONE HCL 5 MG PO TABS: 5.0000 mg | ORAL_TABLET | ORAL | Status: DC | PRN

## 2015-07-31 MED ORDER — RIVAROXABAN 10 MG PO TABS: 10.0000 mg | ORAL_TABLET | Freq: Every day | ORAL | Status: DC

## 2015-07-31 MED ORDER — METHOCARBAMOL 500 MG PO TABS: 500.0000 mg | ORAL_TABLET | Freq: Four times a day (QID) | ORAL | Status: DC | PRN

## 2015-07-31 MED ORDER — TRAMADOL HCL 50 MG PO TABS: 50.0000 mg | ORAL_TABLET | Freq: Four times a day (QID) | ORAL | Status: DC | PRN

## 2015-07-31 NOTE — Evaluation (Signed)
Physical Therapy One Time Evaluation Patient Details Name: [name] MRN: [id number] DOB: 17-Jan-1941 Today's Date: 07/31/2015   History of Present Illness  Pt is a 75 year old male s/p UNICOMPARTMENTAL LEFT KNEE MEDIAL  Clinical Impression  Patient evaluated by Physical Therapy with no further acute PT needs identified. All education has been completed and the patient has no further questions.  Pt ambulating well and practiced one step.  Pt also performed LE exercises.  Pt plans to d/c home today and f/u with HHPT. PT is signing off. Thank you for this referral.     Follow Up Recommendations Home health PT    Equipment Recommendations  None recommended by PT    Recommendations for Other Services       Precautions / Restrictions Precautions Precautions: Knee Restrictions Weight Bearing Restrictions: No Other Position/Activity Restrictions: WBAT      Mobility  Bed Mobility Overal bed mobility: Modified Independent               Transfers Overall transfer level: Needs assistance Equipment used: Rolling walker (2 wheeled) Transfers: Sit to/from Stand Sit to Stand: Supervision            Ambulation/Gait Ambulation/Gait assistance: Supervision Ambulation Distance (Feet): 160 Feet Assistive device: Rolling walker (2 wheeled) Gait Pattern/deviations: Step-to pattern;Antalgic     General Gait Details: verbal cues for sequence, RW positioning, heel strike, posture  Stairs Stairs: Yes Stairs assistance: Min guard Stair Management: Step to pattern;Forwards;With walker Number of Stairs: 1 General stair comments: verbal cues for sequence and safety  Wheelchair Mobility    Modified Rankin (Stroke Patients Only)       Balance                                             Pertinent Vitals/Pain Pain Assessment: 0-10 Pain Score: 3  Pain Location: L knee Pain Descriptors / Indicators: Tightness;Burning Pain Intervention(s): Limited  activity within patient's tolerance;Premedicated before session;Monitored during session;Repositioned;Ice applied    Home Living Family/patient expects to be discharged to:: Private residence Living Arrangements: Spouse/significant other Available Help at Discharge: Family;Available 24 hours/day Type of Home: House Home Access: Stairs to enter Entrance Stairs-Rails: None Entrance Stairs-Number of Steps: 1 Home Layout: One level Home Equipment: Walker - 2 wheels;Cane - single point;Crutches;Bedside commode;Shower seat;Shower seat - built Investment banker, operational      Prior Function Level of Independence: Independent               Hand Dominance   Dominant Hand: Right    Extremity/Trunk Assessment   Upper Extremity Assessment: Overall WFL for tasks assessed           Lower Extremity Assessment: LLE deficits/detail   LLE Deficits / Details: unable to perform SLR, good quad contraction, L knee AAROM 80*  Cervical / Trunk Assessment: Normal  Communication   Communication: No difficulties  Cognition Arousal/Alertness: Awake/alert Behavior During Therapy: WFL for tasks assessed/performed Overall Cognitive Status: Within Functional Limits for tasks assessed                      General Comments      Exercises Total Joint Exercises Ankle Circles/Pumps: AROM;Both;10 reps Quad Sets: AROM;Both;10 reps Short Arc Quad: AROM;Left;10 reps Heel Slides: Seated;AAROM;Left;10 reps Hip ABduction/ADduction: AROM;Left;10 reps Straight Leg Raises: AAROM;Left;10 reps      Assessment/Plan  PT Assessment All further PT needs can be met in the next venue of care  PT Diagnosis Difficulty walking;Acute pain   PT Problem List Decreased strength;Decreased range of motion;Pain  PT Treatment Interventions     PT Goals (Current goals can be found in the Care Plan section) Acute Rehab PT Goals Patient Stated Goal: home today PT Goal Formulation: All assessment and education  complete, DC therapy    Frequency     Barriers to discharge        Co-evaluation               End of Session   Activity Tolerance: Patient tolerated treatment well Patient left: in chair;with call bell/phone within reach Nurse Communication: Mobility status    Functional Assessment Tool Used: clinical judgement Functional Limitation: Mobility: Walking and moving around Mobility: Walking and Moving Around Current Status ([id number]): At least 1 percent but less than 20 percent impaired, limited or restricted Mobility: Walking and Moving Around Goal Status [phone]): At least 1 percent but less than 20 percent impaired, limited or restricted Mobility: Walking and Moving Around Discharge Status [phone]): At least 1 percent but less than 20 percent impaired, limited or restricted    Time: 0936-0952 PT Time Calculation (min) (ACUTE ONLY): 16 min   Charges:   PT Evaluation $PT Eval Low Complexity: 1 Procedure     PT G Codes:   PT G-Codes **NOT FOR INPATIENT CLASS** Functional Assessment Tool Used: clinical judgement Functional Limitation: Mobility: Walking and moving around Mobility: Walking and Moving Around Current Status (H0623): At least 1 percent but less than 20 percent impaired, limited or restricted Mobility: Walking and Moving Around Goal Status [phone]): At least 1 percent but less than 20 percent impaired, limited or restricted Mobility: Walking and Moving Around Discharge Status [phone]): At least 1 percent but less than 20 percent impaired, limited or restricted    Arshad Oberholzer,KATHrine E 07/31/2015, 12:34 PM Carmelia Bake, PT, DPT 07/31/2015 Pager: [phone]

## 2015-07-31 NOTE — Discharge Instructions (Addendum)
° °Dr. Frank Aluisio °Total Joint Specialist °[facility] °[address] °[place], [place] [address] °[phone] ° °UNI KNEE REPLACEMENT POSTOPERATIVE DIRECTIONS ° ° °Knee Rehabilitation, Guidelines Following Surgery  °Results after knee surgery are often greatly improved when you follow the exercise, range of motion and muscle strengthening exercises prescribed by your doctor. Safety measures are also important to protect the knee from further injury. Any time any of these exercises cause you to have increased pain or swelling in your knee joint, decrease the amount until you are comfortable again and slowly increase them. If you have problems or questions, call your caregiver or physical therapist for advice.  ° °HOME CARE INSTRUCTIONS  °Remove items at home which could result in a fall. This includes throw rugs or furniture in walking pathways.  °· ICE to the affected knee every three hours for 30 minutes at a time and then as needed for pain and swelling.  Continue to use ice on the knee for pain and swelling from surgery. You may notice swelling that will progress down to the foot and ankle.  This is normal after surgery.  Elevate the leg when you are not up walking on it.   °· Continue to use the breathing machine which will help keep your temperature down.  It is common for your temperature to cycle up and down following surgery, especially at night when you are not up moving around and exerting yourself.  The breathing machine keeps your lungs expanded and your temperature down. °· Do not place pillow under knee, focus on keeping the knee straight while resting ° °DIET °You may resume your previous home diet once your are discharged from the hospital. ° °DRESSING / WOUND CARE / SHOWERING °You may shower 3 days after surgery, but keep the wounds dry during showering.  You may use an occlusive plastic wrap (Press'n Seal for example), NO SOAKING/SUBMERGING IN THE BATHTUB.  If the  bandage gets wet, change with a clean dry gauze.  If the incision gets wet, pat the wound dry with a clean towel. °You may start showering once you are discharged home but do not submerge the incision under water. Just pat the incision dry and apply a dry gauze dressing on daily. °Change the surgical dressing daily and reapply a dry dressing each time. ° °ACTIVITY °Walk with your walker as instructed. °Use walker as long as suggested by your caregivers. °Avoid periods of inactivity such as sitting longer than an hour when not asleep. This helps prevent blood clots.  °You may resume a sexual relationship in one month or when given the OK by your doctor.  °You may return to work once you are cleared by your doctor.  °Do not drive a car for 6 weeks or until released by you surgeon.  °Do not drive while taking narcotics. ° °WEIGHT BEARING °Weight bearing as tolerated with assist device (walker, cane, etc) as directed, use it as long as suggested by your surgeon or therapist, typically at least 4-6 weeks. ° °POSTOPERATIVE CONSTIPATION PROTOCOL °Constipation - defined medically as fewer than three stools per week and severe constipation as less than one stool per week. ° °One of the most common issues patients have following surgery is constipation.  Even if you have a regular bowel pattern at home, your normal regimen is likely to be disrupted due to multiple reasons following surgery.  Combination of anesthesia, postoperative narcotics, change in appetite and fluid intake all can affect your bowels.    In order to avoid complications following surgery, here are some recommendations in order to help you during your recovery period.  Colace (docusate) - Pick up an over-the-counter form of Colace or another stool softener and take twice a day as long as you are requiring postoperative pain medications.  Take with a full glass of water daily.  If you experience loose stools or diarrhea, hold the colace until you stool forms  back up.  If your symptoms do not get better within 1 week or if they get worse, check with your doctor.  Dulcolax (bisacodyl) - Pick up over-the-counter and take as directed by the product packaging as needed to assist with the movement of your bowels.  Take with a full glass of water.  Use this product as needed if not relieved by Colace only.   MiraLax (polyethylene glycol) - Pick up over-the-counter to have on hand.  MiraLax is a solution that will increase the amount of water in your bowels to assist with bowel movements.  Take as directed and can mix with a glass of water, juice, soda, coffee, or tea.  Take if you go more than two days without a movement. Do not use MiraLax more than once per day. Call your doctor if you are still constipated or irregular after using this medication for 7 days in a row.  If you continue to have problems with postoperative constipation, please contact the office for further assistance and recommendations.  If you experience "the worst abdominal pain ever" or develop nausea or vomiting, please contact the office immediatly for further recommendations for treatment.  ITCHING  If you experience itching with your medications, try taking only a single pain pill, or even half a pain pill at a time.  You can also use Benadryl over the counter for itching or also to help with sleep.   TED HOSE STOCKINGS Wear the elastic stockings on both legs for three weeks following surgery during the day but you may remove then at night for sleeping.  MEDICATIONS See your medication summary on the After Visit Summary that the nursing staff will review with you prior to discharge.  You may have some home medications which will be placed on hold until you complete the course of blood thinner medication.  It is important for you to complete the blood thinner medication as prescribed by your surgeon.  Continue your approved medications as instructed at time of  discharge.  PRECAUTIONS If you experience chest pain or shortness of breath - call 911 immediately for transfer to the hospital emergency department.  If you develop a fever greater that 101 F, purulent drainage from wound, increased redness or drainage from wound, foul odor from the wound/dressing, or calf pain - CONTACT YOUR SURGEON.                                                   FOLLOW-UP APPOINTMENTS Make sure you keep all of your appointments after your operation with your surgeon and caregivers. You should call the office at the above phone number and make an appointment for approximately two weeks after the date of your surgery or on the date instructed by your surgeon outlined in the "After Visit Summary".  RANGE OF MOTION AND STRENGTHENING EXERCISES  Rehabilitation of the knee is important following a knee injury or an  operation. After just a few days of immobilization, the muscles of the thigh which control the knee become weakened and shrink (atrophy). Knee exercises are designed to build up the tone and strength of the thigh muscles and to improve knee motion. Often times heat used for twenty to thirty minutes before working out will loosen up your tissues and help with improving the range of motion but do not use heat for the first two weeks following surgery. These exercises can be done on a training (exercise) mat, on the floor, on a table or on a bed. Use what ever works the best and is most comfortable for you Knee exercises include:  Leg Lifts - While your knee is still immobilized in a splint or cast, you can do straight leg raises. Lift the leg to 60 degrees, hold for 3 sec, and slowly lower the leg. Repeat 10-20 times 2-3 times daily. Perform this exercise against resistance later as your knee gets better.  Quad and Hamstring Sets - Tighten up the muscle on the front of the thigh (Quad) and hold for 5-10 sec. Repeat this 10-20 times hourly. Hamstring sets are done by pushing the  foot backward against an object and holding for 5-10 sec. Repeat as with quad sets.   Leg Slides: Lying on your back, slowly slide your foot toward your buttocks, bending your knee up off the floor (only go as far as is comfortable). Then slowly slide your foot back down until your leg is flat on the floor again.  Angel Wings: Lying on your back spread your legs to the side as far apart as you can without causing discomfort.  A rehabilitation program following serious knee injuries can speed recovery and prevent re-injury in the future due to weakened muscles. Contact your doctor or a physical therapist for more information on knee rehabilitation.   IF YOU ARE TRANSFERRED TO A SKILLED REHAB FACILITY If the patient is transferred to a skilled rehab facility following release from the hospital, a list of the current medications will be sent to the facility for the patient to continue.  When discharged from the skilled rehab facility, please have the facility set up the patient's Home Health Physical Therapy prior to being released. Also, the skilled facility will be responsible for providing the patient with their medications at time of release from the facility to include their pain medication, the muscle relaxants, and their blood thinner medication. If the patient is still at the rehab facility at time of the two week follow up appointment, the skilled rehab facility will also need to assist the patient in arranging follow up appointment in our office and any transportation needs.  MAKE SURE YOU:  Understand these instructions.  Get help right away if you are not doing well or get worse.    Pick up stool softner and laxative for home use following surgery while on pain medications. Do not submerge incision under water. Please use good hand washing techniques while changing dressing each day. May shower starting three days after surgery. Please use a clean towel to pat the incision dry following  showers. Continue to use ice for pain and swelling after surgery. Do not use any lotions or creams on the incision until instructed by your surgeon. Take aspirin 325mg  daily after the completion of 10 days of Xarelto   Information on my medicine - XARELTO (Rivaroxaban)  This medication education was reviewed with me or my healthcare representative as part of my discharge  preparation.  The pharmacist that spoke with me during my hospital stay was:  Damarian Priola, [facility]  Why was Xarelto prescribed for you? Xarelto was prescribed for you to reduce the risk of blood clots forming after orthopedic surgery. The medical term for these abnormal blood clots is venous thromboembolism (VTE).  What do you need to know about xarelto ? Take your Xarelto ONCE DAILY at the same time every day. You may take it either with or without food.  If you have difficulty swallowing the tablet whole, you may crush it and mix in applesauce just prior to taking your dose.  Take Xarelto exactly as prescribed by your doctor and DO NOT stop taking Xarelto without talking to the doctor who prescribed the medication.  Stopping without other VTE prevention medication to take the place of Xarelto may increase your risk of developing a clot.  After discharge, you should have regular check-up appointments with your healthcare provider that is prescribing your Xarelto.    What do you do if you miss a dose? If you miss a dose, take it as soon as you remember on the same day then continue your regularly scheduled once daily regimen the next day. Do not take two doses of Xarelto on the same day.   Important Safety Information A possible side effect of Xarelto is bleeding. You should call your healthcare provider right away if you experience any of the following: ? Bleeding from an injury or your nose that does not stop. ? Unusual colored urine (red or dark brown) or unusual colored stools (red or black). ? Unusual  bruising for unknown reasons. ? A serious fall or if you hit your head (even if there is no bleeding).  Some medicines may interact with Xarelto and might increase your risk of bleeding while on Xarelto. To help avoid this, consult your healthcare provider or pharmacist prior to using any new prescription or non-prescription medications, including herbals, vitamins, non-steroidal anti-inflammatory drugs (NSAIDs) and supplements.  This website has more information on Xarelto: [email or web address].

## 2015-07-31 NOTE — Progress Notes (Signed)
Occupational Therapy Evaluation Patient Details Name: [name] MelenaGary P Dyer MRN: [id number] DOB: Aug 14, 1940 Today's Date: 07/31/2015    History of Present Illness s/p UNICOMPARTMENTAL LEFT KNEE MEDIAL (Left)   Clinical Impression   All OT education completed and pt questions answered. No further OT needs at this time. Wife to assist pt with ADLs at discharge. OT will sign off.    Follow Up Recommendations  No OT follow up;Supervision/Assistance - 24 hour    Equipment Recommendations  None recommended by OT    Recommendations for Other Services       Precautions / Restrictions Precautions Precautions: Knee Restrictions Weight Bearing Restrictions: No Other Position/Activity Restrictions: WBAT      Mobility Bed Mobility               General bed mobility comments: NT -- up in recliner  Transfers Overall transfer level: Needs assistance Equipment used: Rolling walker (2 wheeled) Transfers: Sit to/from Stand Sit to Stand: Supervision              Balance                                            ADL Overall ADL's : Needs assistance/impaired                 Upper Body Dressing : Set up   Lower Body Dressing: Minimal assistance;Sit to/from stand   Toilet Transfer: Supervision/safety;Ambulation;Comfort height toilet;BSC;RW   Toileting- Clothing Manipulation and Hygiene: Supervision/safety;Sit to/from stand   Tub/ Shower Transfer: Walk-in shower;Supervision/safety;Ambulation;Shower seat;Rolling walker;Grab bars   Functional mobility during ADLs: Supervision/safety;Rolling walker       Vision     Perception     Praxis      Pertinent Vitals/Pain Pain Assessment: 0-10 Pain Score: 2  Pain Location: L knee Pain Descriptors / Indicators: Aching;Sore Pain Intervention(s): Limited activity within patient's tolerance;Monitored during session;Repositioned     Hand Dominance Right   Extremity/Trunk Assessment Upper Extremity  Assessment Upper Extremity Assessment: Overall WFL for tasks assessed   Lower Extremity Assessment Lower Extremity Assessment: Defer to PT evaluation   Cervical / Trunk Assessment Cervical / Trunk Assessment: Normal   Communication Communication Communication: No difficulties   Cognition Arousal/Alertness: Awake/alert Behavior During Therapy: WFL for tasks assessed/performed Overall Cognitive Status: Within Functional Limits for tasks assessed                     General Comments       Exercises       Shoulder Instructions      Home Living Family/patient expects to be discharged to:: Private residence Living Arrangements: Spouse/significant other Available Help at Discharge: Family;Available 24 hours/day Type of Home: House Home Access: Stairs to enter Entergy CorporationEntrance Stairs-Number of Steps: 1 Entrance Stairs-Rails: None Home Layout: One level     Bathroom Shower/Tub: Producer, television/film/videoWalk-in shower   Bathroom Toilet: Handicapped height Bathroom Accessibility: Yes How Accessible: Accessible via walker Home Equipment: Walker - 2 wheels;Cane - single point;Crutches;Bedside commode;Shower seat;Shower seat - built Designer, fashion/clothingin;Adaptive equipment Adaptive Equipment: Reacher        Prior Functioning/Environment Level of Independence: Independent             OT Diagnosis: Acute pain   OT Problem List: Decreased strength;Decreased range of motion;Pain   OT Treatment/Interventions:      OT Goals(Current goals can be found in the care  plan section) Acute Rehab OT Goals Patient Stated Goal: home today OT Goal Formulation: All assessment and education complete, DC therapy  OT Frequency:     Barriers to D/C:            Co-evaluation              End of Session Equipment Utilized During Treatment: Rolling walker  Activity Tolerance: Patient tolerated treatment well Patient left: in chair;with call bell/phone within reach;with chair alarm set   Time: [id number] OT Time  Calculation (min): 10 min Charges:  OT General Charges $OT Visit: 1 Procedure OT Evaluation $OT Eval Low Complexity: 1 Procedure G-Codes: OT G-codes **NOT FOR INPATIENT CLASS** Functional Assessment Tool Used: clinical judgment Functional Limitation: Self care Self Care Current Status (V4098(G8987): At least 20 percent but less than 40 percent impaired, limited or restricted Self Care Goal Status ([id number]): At least 1 percent but less than 20 percent impaired, limited or restricted Self Care Discharge Status [phone](G8989): At least 20 percent but less than 40 percent impaired, limited or restricted  Lyrica Mcclarty A 07/31/2015, 12:24 PM

## 2015-07-31 NOTE — Progress Notes (Signed)
Patient alert oriented with spouse at bedside. Pain controlled. Patient received discharge instructions and prescriptions. All questions and concerns answered. Home health setup. Patient verbalized understanding of all discharge instructions.  

## 2015-07-31 NOTE — Progress Notes (Signed)
   Subjective: 1 Day Post-Op Procedure(s) (LRB): UNICOMPARTMENTAL LEFT KNEE MEDIAL (Left) Patient reports pain as mild.   Patient seen in rounds with Dr. Wynelle Link. Patient is well, and has had no acute complaints or problems. Says his pain is under better control this morning than yesterday. Plan is to go Home after hospital stay.  Objective: Vital signs in last 24 hours: Temp:  [97.6 F (36.4 C)-98.4 F (36.9 C)] 98 F (36.7 C) (06/22 0600) Pulse Rate:  [60-82] 64 (06/22 0600) Resp:  [10-19] 18 (06/22 0600) BP: (90-165)/(60-82) 109/64 mmHg (06/22 0600) SpO2:  [90 %-100 %] 98 % (06/22 0600) Weight:  [96.163 kg (212 lb)] 96.163 kg (212 lb) (06/21 1217)  Intake/Output from previous day:  Intake/Output Summary (Last 24 hours) at 07/31/15 0736 Last data filed at 07/31/15 0500  Gross per 24 hour  Intake 3251.66 ml  Output   2430 ml  Net 821.66 ml     Labs:  Recent Labs  07/31/15 0413  HGB 12.3*    Recent Labs  07/31/15 0413  WBC 14.3*  RBC 3.63*  HCT 35.6*  PLT 203    Recent Labs  07/31/15 0413  NA 137  K 4.1  CL 107  CO2 25  BUN 18  CREATININE 0.82  GLUCOSE 149*  CALCIUM 8.4*    EXAM General - Patient is Alert and Oriented Extremity - Neurologically intact Intact pulses distally Dorsiflexion/Plantar flexion intact No cellulitis present Compartment soft Dressing - dressing C/D/I Motor Function - intact, moving foot and toes well on exam.  Hemovac pulled without difficulty.  Past Medical History  Diagnosis Date  . Hyperlipidemia     takes Lipitor and Fish Oil daily  . Arthritis   . Joint pain   . Joint swelling   . Chronic back pain     DDD  . Dysphagia     EGD with dilitation  . GERD (gastroesophageal reflux disease)     takes Omeprazole daily  . Multiple duodenal ulcers   . Enlarged prostate     sees a urologist  . Diabetes mellitus     takes Actos-met  . Insomnia     d/t pain  . Constipation   . Barrett's esophagus with  dysplasia   . History of bronchitis   . Wears glasses   . Nocturia   . Urinary frequency   . Hard of hearing   . Sleep apnea     uses CPAP- does not know settings     Assessment/Plan: 1 Day Post-Op Procedure(s) (LRB): UNICOMPARTMENTAL LEFT KNEE MEDIAL (Left) Principal Problem:   OA (osteoarthritis) of knee  Estimated body mass index is 33.2 kg/(m^2) as calculated from the following:   Height as of this encounter: '5\' 7"'$  (1.702 m).   Weight as of this encounter: 96.163 kg (212 lb). Advance diet Up with therapy D/C IV fluids Discharge home with home health  DVT Prophylaxis - Xarelto Weight-Bearing as tolerated to left leg D/C O2 and Pulse OX and try on Room Air  Will have him work with therapy for a couple sessions today. DC home if he progresses well.   Ardeen Jourdain, PA-C Orthopaedic Surgery 07/31/2015, 7:36 AM

## 2015-08-01 NOTE — Discharge Summary (Signed)
Physician Discharge Summary   Patient ID: [name] MRN: [id number] DOB/AGE: 76-Mar-1942 76 y.o.  Admit date: 07/30/2015 Discharge date: 07/31/2015  Primary Diagnosis: Primary osteoarthritis left knee   Admission Diagnoses:  Past Medical History  Diagnosis Date  . Hyperlipidemia     takes Lipitor and Fish Oil daily  . Arthritis   . Joint pain   . Joint swelling   . Chronic back pain     DDD  . Dysphagia     EGD with dilitation  . GERD (gastroesophageal reflux disease)     takes Omeprazole daily  . Multiple duodenal ulcers   . Enlarged prostate     sees a urologist  . Diabetes mellitus     takes Actos-met  . Insomnia     d/t pain  . Constipation   . Barrett's esophagus with dysplasia   . History of bronchitis   . Wears glasses   . Nocturia   . Urinary frequency   . Hard of hearing   . Sleep apnea     uses CPAP- does not know settings    Discharge Diagnoses:   Principal Problem:   OA (osteoarthritis) of knee  Estimated body mass index is 33.2 kg/(m^2) as calculated from the following:   Height as of this encounter: 5' 7"  (1.702 m).   Weight as of this encounter: 96.163 kg (212 lb).  Procedure:  Procedure(s) (LRB): UNICOMPARTMENTAL LEFT KNEE MEDIAL (Left)   Consults: None  HPI: [name], 75 y.o. male, has a history of pain and functional disability in the left knee due to arthritis and has failed non-surgical conservative treatments for greater than 12 weeks to includeNSAID's and/or analgesics, corticosteriod injections, flexibility and strengthening excercises and activity modification. Onset of symptoms was gradual, starting >10 years ago with gradually worsening course since that time. The patient noted no past surgery on the left knee(s). Patient currently rates pain in the left knee(s) at 8 out of 10 with activity. Patient has night pain, worsening of pain with activity and weight bearing, pain that interferes with activities of daily living, pain  with passive range of motion, crepitus and joint swelling. Patient has evidence of periarticular osteophytes and joint space narrowing by imaging studies. There is no active infection.  Laboratory Data: Admission on 07/30/2015, Discharged on 07/31/2015  Component Date Value Ref Range Status  . ABO/RH(D) 07/30/2015 A POS   Final  . Antibody Screen 07/30/2015 POS   Final  . Sample Expiration 07/30/2015 08/02/2015   Final  . DAT, IgG 07/30/2015 NEG   Final  . Unit Number 07/30/2015 [id number]   Final  . Blood Component Type 07/30/2015 RED CELLS,LR   Final  . Unit division 07/30/2015 00   Final  . Status of Unit 07/30/2015 ALLOCATED   Final  . Transfusion Status 07/30/2015 OK TO TRANSFUSE   Final  . Crossmatch Result 07/30/2015 COMPATIBLE   Final  . Glucose-Capillary 07/30/2015 147* 65 - 99 mg/dL Final  . Comment 1 07/30/2015 Notify RN   Final  . WBC 07/31/2015 14.3* 4.0 - 10.5 K/uL Final  . RBC 07/31/2015 3.63* 4.22 - 5.81 MIL/uL Final  . Hemoglobin 07/31/2015 12.3* 13.0 - 17.0 g/dL Final  . HCT 07/31/2015 35.6* 39.0 - 52.0 % Final  . MCV 07/31/2015 98.1  78.0 - 100.0 fL Final  . MCH 07/31/2015 33.9  26.0 - 34.0 pg Final  . MCHC 07/31/2015 34.6  30.0 - 36.0 g/dL Final  . RDW 07/31/2015 13.4  11.5 -  15.5 % Final  . Platelets 07/31/2015 203  150 - 400 K/uL Final  . Sodium 07/31/2015 137  135 - 145 mmol/L Final  . Potassium 07/31/2015 4.1  3.5 - 5.1 mmol/L Final  . Chloride 07/31/2015 107  101 - 111 mmol/L Final  . CO2 07/31/2015 25  22 - 32 mmol/L Final  . Glucose, Bld 07/31/2015 149* 65 - 99 mg/dL Final  . BUN 07/31/2015 18  6 - 20 mg/dL Final  . Creatinine, Ser 07/31/2015 0.82  0.61 - 1.24 mg/dL Final  . Calcium 07/31/2015 8.4* 8.9 - 10.3 mg/dL Final  . GFR calc non Af Amer 07/31/2015 >60  >60 mL/min Final  . GFR calc Af Amer 07/31/2015 >60  >60 mL/min Final   Comment: (NOTE) The eGFR has been calculated using the CKD EPI equation. This calculation has not been validated in all  clinical situations. eGFR's persistently <60 mL/min signify possible Chronic Kidney Disease.   . Anion gap 07/31/2015 5  5 - 15 Final  . Glucose-Capillary 07/30/2015 252* 65 - 99 mg/dL Final  . Glucose-Capillary 07/30/2015 160* 65 - 99 mg/dL Final  . Comment 1 07/30/2015 Notify RN   Final  . Glucose-Capillary 07/31/2015 144* 65 - 99 mg/dL Final  . Glucose-Capillary 07/31/2015 167* 65 - 99 mg/dL Final  Hospital Outpatient Visit on 07/24/2015  Component Date Value Ref Range Status  . aPTT 07/24/2015 41* 24 - 37 seconds Final   Comment:        IF BASELINE aPTT IS ELEVATED, SUGGEST PATIENT RISK ASSESSMENT BE USED TO DETERMINE APPROPRIATE ANTICOAGULANT THERAPY.   . WBC 07/24/2015 6.7  4.0 - 10.5 K/uL Final  . RBC 07/24/2015 4.12* 4.22 - 5.81 MIL/uL Final  . Hemoglobin 07/24/2015 14.0  13.0 - 17.0 g/dL Final  . HCT 07/24/2015 39.8  39.0 - 52.0 % Final  . MCV 07/24/2015 96.6  78.0 - 100.0 fL Final  . MCH 07/24/2015 34.0  26.0 - 34.0 pg Final  . MCHC 07/24/2015 35.2  30.0 - 36.0 g/dL Final  . RDW 07/24/2015 13.2  11.5 - 15.5 % Final  . Platelets 07/24/2015 231  150 - 400 K/uL Final  . Sodium 07/24/2015 138  135 - 145 mmol/L Final  . Potassium 07/24/2015 4.1  3.5 - 5.1 mmol/L Final  . Chloride 07/24/2015 106  101 - 111 mmol/L Final  . CO2 07/24/2015 27  22 - 32 mmol/L Final  . Glucose, Bld 07/24/2015 153* 65 - 99 mg/dL Final  . BUN 07/24/2015 16  6 - 20 mg/dL Final  . Creatinine, Ser 07/24/2015 0.78  0.61 - 1.24 mg/dL Final  . Calcium 07/24/2015 9.0  8.9 - 10.3 mg/dL Final  . Total Protein 07/24/2015 6.6  6.5 - 8.1 g/dL Final  . Albumin 07/24/2015 4.0  3.5 - 5.0 g/dL Final  . AST 07/24/2015 23  15 - 41 U/L Final  . ALT 07/24/2015 23  17 - 63 U/L Final  . Alkaline Phosphatase 07/24/2015 51  38 - 126 U/L Final  . Total Bilirubin 07/24/2015 1.2  0.3 - 1.2 mg/dL Final  . GFR calc non Af Amer 07/24/2015 >60  >60 mL/min Final  . GFR calc Af Amer 07/24/2015 >60  >60 mL/min Final    Comment: (NOTE) The eGFR has been calculated using the CKD EPI equation. This calculation has not been validated in all clinical situations. eGFR's persistently <60 mL/min signify possible Chronic Kidney Disease.   . Anion gap 07/24/2015 5  5 - 15 Final  .  Prothrombin Time 07/24/2015 13.7  11.6 - 15.2 seconds Final  . INR 07/24/2015 1.07  0.00 - 1.49 Final  . ABO/RH(D) 07/24/2015 A POS   Final  . Antibody Screen 07/24/2015 POS   Final  . Sample Expiration 07/24/2015 07/27/2015   Final  . DAT, IgG 07/24/2015 NEG   Final  . Antibody Identification [id number] NO CLINICALLY SIGNIFICANT ANTIBODY IDENTIFIED   Final  . Color, Urine 07/24/2015 YELLOW  YELLOW Final  . APPearance 07/24/2015 CLEAR  CLEAR Final  . Specific Gravity, Urine 07/24/2015 1.023  1.005 - 1.030 Final  . pH 07/24/2015 5.0  5.0 - 8.0 Final  . Glucose, UA 07/24/2015 NEGATIVE  NEGATIVE mg/dL Final  . Hgb urine dipstick 07/24/2015 NEGATIVE  NEGATIVE Final  . Bilirubin Urine 07/24/2015 NEGATIVE  NEGATIVE Final  . Ketones, ur 07/24/2015 NEGATIVE  NEGATIVE mg/dL Final  . Protein, ur 07/24/2015 NEGATIVE  NEGATIVE mg/dL Final  . Nitrite 07/24/2015 NEGATIVE  NEGATIVE Final  . Leukocytes, UA 07/24/2015 NEGATIVE  NEGATIVE Final   MICROSCOPIC NOT DONE ON URINES WITH NEGATIVE PROTEIN, BLOOD, LEUKOCYTES, NITRITE, OR GLUCOSE <1000 mg/dL.  [name] Kitchen MRSA, PCR 07/24/2015 NEGATIVE  NEGATIVE Final  . Staphylococcus aureus 07/24/2015 NEGATIVE  NEGATIVE Final   Comment:        The Xpert SA Assay (FDA approved for NASAL specimens in patients over 84 years of age), is one component of a comprehensive surveillance program.  Test performance has been validated by [facility] for patients greater than or equal to 89 year old. It is not intended to diagnose infection nor to guide or monitor treatment.   . Hgb A1c MFr Bld 07/24/2015 6.6* 4.8 - 5.6 % Final   Comment: (NOTE)         Pre-diabetes: 5.7 - 6.4         Diabetes: >6.4         Glycemic  control for adults with diabetes: <7.0   . Mean Plasma Glucose 07/24/2015 143   Final   Comment: (NOTE) Performed At: [facility] [place], [place] [id number] Lindon Romp MD [id number]      EKG: Orders placed or performed during the hospital encounter of 04/08/15  . EKG test  . EKG test     Hospital Course: [name] is a 75 y.o. who was admitted to [facility]. They were brought to the operating room on 07/30/2015 and underwent Procedure(s): UNICOMPARTMENTAL LEFT KNEE MEDIAL.  Patient tolerated the procedure well and was later transferred to the recovery room and then to the orthopaedic floor for postoperative care.  They were given PO and IV analgesics for pain control following their surgery.  They were given 24 hours of postoperative antibiotics of  Anti-infectives    Start     Dose/Rate Route Frequency Ordered Stop   07/30/15 1600  ceFAZolin (ANCEF) IVPB 2g/100 mL premix     2 g 200 mL/hr over 30 Minutes Intravenous Every 6 hours 07/30/15 1226 07/30/15 2202   07/30/15 0728  ceFAZolin (ANCEF) IVPB 2g/100 mL premix     2 g 200 mL/hr over 30 Minutes Intravenous On call to O.R. 07/30/15 8381 07/30/15 1005     and started on DVT prophylaxis in the form of Xarelto.   PT and OT were ordered for total joint protocol.  Discharge planning consulted to help with postop disposition and equipment needs.  Patient had a good night on the evening of surgery.  They started to get up OOB  with therapy on day one. Hemovac drain was pulled without difficulty.  The patient had progressed with therapy and meeting their goals.  Incision was healing well.  Patient was seen in rounds and was ready to go home.   Diet: Cardiac diet and Diabetic diet Activity:WBAT Follow-up:in 2 weeks Disposition - Home Discharged Condition: stable   Discharge Instructions    Call MD / Call 911    Complete by:  As directed   If you experience chest pain or shortness of  breath, CALL 911 and be transported to the hospital emergency room.  If you develope a fever above 101 F, pus (white drainage) or increased drainage or redness at the wound, or calf pain, call your surgeon's office.     Constipation Prevention    Complete by:  As directed   Drink plenty of fluids.  Prune juice may be helpful.  You may use a stool softener, such as Colace (over the counter) 100 mg twice a day.  Use MiraLax (over the counter) for constipation as needed.     Diet - low sodium heart healthy    Complete by:  As directed      Diet Carb Modified    Complete by:  As directed      Discharge instructions    Complete by:  As directed   Dr. Gaynelle Arabian Total Joint Specialist [facility] [address], [place], [place] [address] [phone]  UNI KNEE REPLACEMENT POSTOPERATIVE DIRECTIONS   Knee Rehabilitation, Guidelines Following Surgery  Results after knee surgery are often greatly improved when you follow the exercise, range of motion and muscle strengthening exercises prescribed by your doctor. Safety measures are also important to protect the knee from further injury. Any time any of these exercises cause you to have increased pain or swelling in your knee joint, decrease the amount until you are comfortable again and slowly increase them. If you have problems or questions, call your caregiver or physical therapist for advice.   HOME CARE INSTRUCTIONS  Remove items at home which could result in a fall. This includes throw rugs or furniture in walking pathways.  ICE to the affected knee every three hours for 30 minutes at a time and then as needed for pain and swelling.  Continue to use ice on the knee for pain and swelling from surgery. You may notice swelling that will progress down to the foot and ankle.  This is normal after surgery.  Elevate the leg when you are not up walking on it.   Continue to use the breathing machine which will help keep your  temperature down.  It is common for your temperature to cycle up and down following surgery, especially at night when you are not up moving around and exerting yourself.  The breathing machine keeps your lungs expanded and your temperature down. Do not place pillow under knee, focus on keeping the knee straight while resting  DIET You may resume your previous home diet once your are discharged from the hospital.  DRESSING / WOUND CARE / SHOWERING You may shower 3 days after surgery, but keep the wounds dry during showering.  You may use an occlusive plastic wrap (Press'n Seal for example), NO SOAKING/SUBMERGING IN THE BATHTUB.  If the bandage gets wet, change with a clean dry gauze.  If the incision gets wet, pat the wound dry with a clean towel. You may start showering once you are discharged home but do not submerge the incision  under water. Just pat the incision dry and apply a dry gauze dressing on daily. Change the surgical dressing daily and reapply a dry dressing each time.  ACTIVITY Walk with your walker as instructed. Use walker as long as suggested by your caregivers. Avoid periods of inactivity such as sitting longer than an hour when not asleep. This helps prevent blood clots.  You may resume a sexual relationship in one month or when given the OK by your doctor.  You may return to work once you are cleared by your doctor.  Do not drive a car for 6 weeks or until released by you surgeon.  Do not drive while taking narcotics.  WEIGHT BEARING Weight bearing as tolerated with assist device (walker, cane, etc) as directed, use it as long as suggested by your surgeon or therapist, typically at least 4-6 weeks.  POSTOPERATIVE CONSTIPATION PROTOCOL Constipation - defined medically as fewer than three stools per week and severe constipation as less than one stool per week.  One of the most common issues patients have following surgery is constipation.  Even if you have a regular bowel  pattern at home, your normal regimen is likely to be disrupted due to multiple reasons following surgery.  Combination of anesthesia, postoperative narcotics, change in appetite and fluid intake all can affect your bowels.  In order to avoid complications following surgery, here are some recommendations in order to help you during your recovery period.  Colace (docusate) - Pick up an over-the-counter form of Colace or another stool softener and take twice a day as long as you are requiring postoperative pain medications.  Take with a full glass of water daily.  If you experience loose stools or diarrhea, hold the colace until you stool forms back up.  If your symptoms do not get better within 1 week or if they get worse, check with your doctor.  Dulcolax (bisacodyl) - Pick up over-the-counter and take as directed by the product packaging as needed to assist with the movement of your bowels.  Take with a full glass of water.  Use this product as needed if not relieved by Colace only.   MiraLax (polyethylene glycol) - Pick up over-the-counter to have on hand.  MiraLax is a solution that will increase the amount of water in your bowels to assist with bowel movements.  Take as directed and can mix with a glass of water, juice, soda, coffee, or tea.  Take if you go more than two days without a movement. Do not use MiraLax more than once per day. Call your doctor if you are still constipated or irregular after using this medication for 7 days in a row.  If you continue to have problems with postoperative constipation, please contact the office for further assistance and recommendations.  If you experience "the worst abdominal pain ever" or develop nausea or vomiting, please contact the office immediatly for further recommendations for treatment.  ITCHING  If you experience itching with your medications, try taking only a single pain pill, or even half a pain pill at a time.  You can also use Benadryl over the  counter for itching or also to help with sleep.   TED HOSE STOCKINGS Wear the elastic stockings on both legs for three weeks following surgery during the day but you may remove then at night for sleeping.  MEDICATIONS See your medication summary on the "After Visit Summary" that the nursing staff will review with you prior to discharge.  You  may have some home medications which will be placed on hold until you complete the course of blood thinner medication.  It is important for you to complete the blood thinner medication as prescribed by your surgeon.  Continue your approved medications as instructed at time of discharge.  PRECAUTIONS If you experience chest pain or shortness of breath - call 911 immediately for transfer to the hospital emergency department.  If you develop a fever greater that 101 F, purulent drainage from wound, increased redness or drainage from wound, foul odor from the wound/dressing, or calf pain - CONTACT YOUR SURGEON.                                                   FOLLOW-UP APPOINTMENTS Make sure you keep all of your appointments after your operation with your surgeon and caregivers. You should call the office at the above phone number and make an appointment for approximately two weeks after the date of your surgery or on the date instructed by your surgeon outlined in the "After Visit Summary".  RANGE OF MOTION AND STRENGTHENING EXERCISES  Rehabilitation of the knee is important following a knee injury or an operation. After just a few days of immobilization, the muscles of the thigh which control the knee become weakened and shrink (atrophy). Knee exercises are designed to build up the tone and strength of the thigh muscles and to improve knee motion. Often times heat used for twenty to thirty minutes before working out will loosen up your tissues and help with improving the range of motion but do not use heat for the first two weeks following surgery. These exercises  can be done on a training (exercise) mat, on the floor, on a table or on a bed. Use what ever works the best and is most comfortable for you Knee exercises include:  Leg Lifts - While your knee is still immobilized in a splint or cast, you can do straight leg raises. Lift the leg to 60 degrees, hold for 3 sec, and slowly lower the leg. Repeat 10-20 times 2-3 times daily. Perform this exercise against resistance later as your knee gets better.  Quad and Hamstring Sets - Tighten up the muscle on the front of the thigh (Quad) and hold for 5-10 sec. Repeat this 10-20 times hourly. Hamstring sets are done by pushing the foot backward against an object and holding for 5-10 sec. Repeat as with quad sets.  Leg Slides: Lying on your back, slowly slide your foot toward your buttocks, bending your knee up off the floor (only go as far as is comfortable). Then slowly slide your foot back down until your leg is flat on the floor again. Angel Wings: Lying on your back spread your legs to the side as far apart as you can without causing discomfort.  A rehabilitation program following serious knee injuries can speed recovery and prevent re-injury in the future due to weakened muscles. Contact your doctor or a physical therapist for more information on knee rehabilitation.   IF YOU ARE TRANSFERRED TO A SKILLED REHAB FACILITY If the patient is transferred to a skilled rehab facility following release from the hospital, a list of the current medications will be sent to the facility for the patient to continue.  When discharged from the skilled rehab facility, please have the facility set  up the patient's [place] prior to being released. Also, the skilled facility will be responsible for providing the patient with their medications at time of release from the facility to include their pain medication, the muscle relaxants, and their blood thinner medication. If the patient is still at the rehab facility at  time of the two week follow up appointment, the skilled rehab facility will also need to assist the patient in arranging follow up appointment in our office and any transportation needs.  MAKE SURE YOU:  Understand these instructions.  Get help right away if you are not doing well or get worse.    Pick up stool softner and laxative for home use following surgery while on pain medications. Do not submerge incision under water. Please use good hand washing techniques while changing dressing each day. May shower starting three days after surgery. Please use a clean towel to pat the incision dry following showers. Continue to use ice for pain and swelling after surgery. Do not use any lotions or creams on the incision until instructed by your surgeon.     Increase activity slowly as tolerated    Complete by:  As directed             Medication List    STOP taking these medications        aspirin EC 81 MG tablet     Biotin 5000 MCG Caps     Fish Oil 1200 MG Caps     glucosamine-chondroitin 500-400 MG tablet     multivitamin with minerals Tabs tablet     naproxen sodium 220 MG tablet  Commonly known as:  ANAPROX     SAW PALMETTO PO     triamcinolone 55 MCG/ACT Aero nasal inhaler  Commonly known as:  NASACORT     vitamin B-12 1000 MCG tablet  Commonly known as:  CYANOCOBALAMIN     Vitamin D3 2000 units Tabs      TAKE these medications        atorvastatin 80 MG tablet  Commonly known as:  LIPITOR  Take 40-80 mg by mouth daily. Take 80 mg on Monday, Wednesday and Friday , and 40 mg all other days.     BIOFREEZE EX  Apply 1 application topically as needed (For pain.).     CITRUCEL PO  Take 2 capsules by mouth daily.     docusate sodium 100 MG capsule  Commonly known as:  COLACE  Take 200 mg by mouth daily.     doxycycline 100 MG tablet  Commonly known as:  VIBRA-TABS  Take 100 mg by mouth 2 (two) times daily.     finasteride 5 MG tablet  Commonly known as:   PROSCAR  Take 5 mg by mouth daily.     methocarbamol 500 MG tablet  Commonly known as:  ROBAXIN  Take 1 tablet (500 mg total) by mouth every 6 (six) hours as needed for muscle spasms.     omeprazole 20 MG capsule  Commonly known as:  PRILOSEC  Take 20 mg by mouth daily.     oxyCODONE 5 MG immediate release tablet  Commonly known as:  Oxy IR/ROXICODONE  Take 1-2 tablets (5-10 mg total) by mouth every 3 (three) hours as needed for breakthrough pain.     pioglitazone-metformin 15-500 MG tablet  Commonly known as:  ACTOPLUS MET  Take 1 tablet by mouth daily.     rivaroxaban 10 MG Tabs tablet  Commonly known as:  XARELTO  Take 1 tablet (10 mg total) by mouth daily with breakfast.     traMADol 50 MG tablet  Commonly known as:  ULTRAM  Take 1-2 tablets (50-100 mg total) by mouth every 6 (six) hours as needed for moderate pain.           Follow-up Information    Follow up with [facility].   Why:  home health physical therapy   Contact information:   [place] [address] [phone]       Follow up with [name], MD. Schedule an appointment as soon as possible for a visit on 08/15/2015.   Specialty:  Orthopedic Surgery   Contact information:   [address] [place] [address] [phone]       Signed: Ardeen Jourdain, PA-C Orthopaedic Surgery 08/01/2015, 8:40 AM

## 2015-08-03 LAB — TYPE AND SCREEN
ABO/RH(D): A POS
Antibody Screen: POSITIVE
DAT, IgG: NEGATIVE
Unit division: 0

## 2015-08-28 ENCOUNTER — Emergency Department (HOSPITAL_COMMUNITY): Payer: Medicare Other

## 2015-08-28 ENCOUNTER — Observation Stay (HOSPITAL_COMMUNITY): Payer: Medicare Other

## 2015-08-28 ENCOUNTER — Inpatient Hospital Stay (HOSPITAL_COMMUNITY)
Admission: EM | Admit: 2015-08-28 | Discharge: 2015-09-02 | DRG: 560 | Disposition: A | Discharge status: Skilled Nursing Facility | Payer: Medicare Other | Attending: Internal Medicine | Admitting: Internal Medicine

## 2015-08-28 ENCOUNTER — Encounter (HOSPITAL_COMMUNITY): Payer: Self-pay | Admitting: Emergency Medicine

## 2015-08-28 DIAGNOSIS — K219 Gastro-esophageal reflux disease without esophagitis: Secondary | ICD-10-CM | POA: Diagnosis present

## 2015-08-28 DIAGNOSIS — S82831A Other fracture of upper and lower end of right fibula, initial encounter for closed fracture: Secondary | ICD-10-CM | POA: Diagnosis present

## 2015-08-28 DIAGNOSIS — S8701XA Crushing injury of right knee, initial encounter: Secondary | ICD-10-CM | POA: Diagnosis present

## 2015-08-28 DIAGNOSIS — E119 Type 2 diabetes mellitus without complications: Secondary | ICD-10-CM | POA: Diagnosis present

## 2015-08-28 DIAGNOSIS — Z87891 Personal history of nicotine dependence: Secondary | ICD-10-CM | POA: Diagnosis not present

## 2015-08-28 DIAGNOSIS — M545 Low back pain, unspecified: Secondary | ICD-10-CM

## 2015-08-28 DIAGNOSIS — R059 Cough, unspecified: Secondary | ICD-10-CM

## 2015-08-28 DIAGNOSIS — M549 Dorsalgia, unspecified: Secondary | ICD-10-CM | POA: Diagnosis present

## 2015-08-28 DIAGNOSIS — S82142A Displaced bicondylar fracture of left tibia, initial encounter for closed fracture: Secondary | ICD-10-CM | POA: Diagnosis present

## 2015-08-28 DIAGNOSIS — S8700XD Crushing injury of unspecified knee, subsequent encounter: Secondary | ICD-10-CM | POA: Diagnosis not present

## 2015-08-28 DIAGNOSIS — M9712XA Periprosthetic fracture around internal prosthetic left knee joint, initial encounter: Secondary | ICD-10-CM | POA: Diagnosis present

## 2015-08-28 DIAGNOSIS — S8780XA Crushing injury of unspecified lower leg, initial encounter: Secondary | ICD-10-CM | POA: Diagnosis present

## 2015-08-28 DIAGNOSIS — Z79899 Other long term (current) drug therapy: Secondary | ICD-10-CM | POA: Diagnosis not present

## 2015-08-28 DIAGNOSIS — Z981 Arthrodesis status: Secondary | ICD-10-CM

## 2015-08-28 DIAGNOSIS — K5903 Drug induced constipation: Secondary | ICD-10-CM | POA: Diagnosis present

## 2015-08-28 DIAGNOSIS — S8702XA Crushing injury of left knee, initial encounter: Secondary | ICD-10-CM | POA: Diagnosis present

## 2015-08-28 DIAGNOSIS — T402X5A Adverse effect of other opioids, initial encounter: Secondary | ICD-10-CM | POA: Diagnosis not present

## 2015-08-28 DIAGNOSIS — Z8711 Personal history of peptic ulcer disease: Secondary | ICD-10-CM | POA: Diagnosis not present

## 2015-08-28 DIAGNOSIS — S8700XA Crushing injury of unspecified knee, initial encounter: Secondary | ICD-10-CM | POA: Diagnosis present

## 2015-08-28 DIAGNOSIS — R05 Cough: Secondary | ICD-10-CM | POA: Diagnosis not present

## 2015-08-28 DIAGNOSIS — S82092A Other fracture of left patella, initial encounter for closed fracture: Secondary | ICD-10-CM | POA: Diagnosis present

## 2015-08-28 DIAGNOSIS — E781 Pure hyperglyceridemia: Secondary | ICD-10-CM

## 2015-08-28 DIAGNOSIS — G4733 Obstructive sleep apnea (adult) (pediatric): Secondary | ICD-10-CM | POA: Diagnosis present

## 2015-08-28 DIAGNOSIS — Z96653 Presence of artificial knee joint, bilateral: Secondary | ICD-10-CM | POA: Diagnosis present

## 2015-08-28 DIAGNOSIS — E785 Hyperlipidemia, unspecified: Secondary | ICD-10-CM | POA: Diagnosis present

## 2015-08-28 DIAGNOSIS — N400 Enlarged prostate without lower urinary tract symptoms: Secondary | ICD-10-CM | POA: Diagnosis present

## 2015-08-28 DIAGNOSIS — H9190 Unspecified hearing loss, unspecified ear: Secondary | ICD-10-CM | POA: Diagnosis present

## 2015-08-28 DIAGNOSIS — Z9989 Dependence on other enabling machines and devices: Secondary | ICD-10-CM

## 2015-08-28 DIAGNOSIS — Z7901 Long term (current) use of anticoagulants: Secondary | ICD-10-CM | POA: Diagnosis not present

## 2015-08-28 HISTORY — DX: Dependence on other enabling machines and devices: Z99.89

## 2015-08-28 HISTORY — DX: Low back pain: M54.5

## 2015-08-28 HISTORY — DX: Type 2 diabetes mellitus without complications: E11.9

## 2015-08-28 HISTORY — DX: Low back pain, unspecified: M54.50

## 2015-08-28 HISTORY — DX: Obstructive sleep apnea (adult) (pediatric): G47.33

## 2015-08-28 HISTORY — DX: Other chronic pain: G89.29

## 2015-08-28 LAB — COMPREHENSIVE METABOLIC PANEL
ALT: 23 U/L (ref 17–63)
AST: 26 U/L (ref 15–41)
Albumin: 4.1 g/dL (ref 3.5–5.0)
Alkaline Phosphatase: 61 U/L (ref 38–126)
Anion gap: 8 (ref 5–15)
BUN: 20 mg/dL (ref 6–20)
CO2: 23 mmol/L (ref 22–32)
Calcium: 9 mg/dL (ref 8.9–10.3)
Chloride: 107 mmol/L (ref 101–111)
Creatinine, Ser: 0.99 mg/dL (ref 0.61–1.24)
GFR calc Af Amer: >60 mL/min (ref >60)
GFR calc non Af Amer: >60 mL/min (ref >60)
Glucose, Bld: 167 mg/dL — ABNORMAL HIGH (ref 65–99)
Potassium: 3.7 mmol/L (ref 3.5–5.1)
Sodium: 138 mmol/L (ref 135–145)
Total Bilirubin: 1.6 mg/dL — ABNORMAL HIGH (ref 0.3–1.2)
Total Protein: 6.3 g/dL — ABNORMAL LOW (ref 6.5–8.1)

## 2015-08-28 LAB — CBC WITH DIFFERENTIAL/PLATELET
Basophils Absolute: 0 10*3/uL (ref 0.0–0.1)
Basophils Relative: 0 %
Eosinophils Absolute: 0.3 10*3/uL (ref 0.0–0.7)
Eosinophils Relative: 3 %
HCT: 39.8 % (ref 39.0–52.0)
Hemoglobin: 13.4 g/dL (ref 13.0–17.0)
Lymphocytes Relative: 13 %
Lymphs Abs: 1.8 10*3/uL (ref 0.7–4.0)
MCH: 32.8 pg (ref 26.0–34.0)
MCHC: 33.7 g/dL (ref 30.0–36.0)
MCV: 97.3 fL (ref 78.0–100.0)
Monocytes Absolute: 1 10*3/uL (ref 0.1–1.0)
Monocytes Relative: 8 %
Neutro Abs: 10.3 10*3/uL — ABNORMAL HIGH (ref 1.7–7.7)
Neutrophils Relative %: 76 %
Platelets: 216 10*3/uL (ref 150–400)
RBC: 4.09 MIL/uL — ABNORMAL LOW (ref 4.22–5.81)
RDW: 13.3 % (ref 11.5–15.5)
WBC: 13.4 10*3/uL — ABNORMAL HIGH (ref 4.0–10.5)

## 2015-08-28 LAB — GLUCOSE, CAPILLARY
Glucose-Capillary: 163 mg/dL — ABNORMAL HIGH (ref 65–99)
Glucose-Capillary: 136 mg/dL — ABNORMAL HIGH (ref 65–99)

## 2015-08-28 MED ORDER — TRAZODONE HCL 50 MG PO TABS
25.0000 mg | ORAL_TABLET | Freq: Every evening | ORAL | Status: DC | PRN
  Administered 2015-08-29: 25 mg via ORAL
  Filled 2015-08-28: qty 1

## 2015-08-28 MED ORDER — ACETAMINOPHEN 650 MG RE SUPP: 650.0000 mg | Freq: Four times a day (QID) | RECTAL | Status: DC | PRN

## 2015-08-28 MED ORDER — ONDANSETRON HCL 4 MG/2ML IJ SOLN: 4.0000 mg | Freq: Four times a day (QID) | INTRAMUSCULAR | Status: DC | PRN

## 2015-08-28 MED ORDER — ACETAMINOPHEN 325 MG PO TABS: 650.0000 mg | ORAL_TABLET | Freq: Four times a day (QID) | ORAL | Status: DC | PRN

## 2015-08-28 MED ORDER — ATORVASTATIN CALCIUM 80 MG PO TABS
80.0000 mg | ORAL_TABLET | Freq: Every day | ORAL | Status: DC
  Administered 2015-08-28 – 2015-08-30 (×3): 80 mg via ORAL
  Filled 2015-08-28 (×2): qty 1

## 2015-08-28 MED ORDER — DOCUSATE SODIUM 100 MG PO CAPS
200.0000 mg | ORAL_CAPSULE | Freq: Every day | ORAL | Status: DC
  Administered 2015-08-28 – 2015-09-02 (×6): 200 mg via ORAL
  Filled 2015-08-28 (×7): qty 2

## 2015-08-28 MED ORDER — METHYLCELLULOSE (LAXATIVE) PO POWD: 1.0000 | Freq: Every day | ORAL | Status: DC

## 2015-08-28 MED ORDER — POLYETHYLENE GLYCOL 3350 17 G PO PACK: 17.0000 g | PACK | Freq: Every day | ORAL | Status: DC | PRN

## 2015-08-28 MED ORDER — OXYCODONE HCL ER 10 MG PO T12A
10.0000 mg | EXTENDED_RELEASE_TABLET | Freq: Two times a day (BID) | ORAL | Status: DC
  Administered 2015-08-28 – 2015-09-02 (×10): 10 mg via ORAL
  Filled 2015-08-28 (×10): qty 1

## 2015-08-28 MED ORDER — BISACODYL 5 MG PO TBEC: 5.0000 mg | DELAYED_RELEASE_TABLET | Freq: Every day | ORAL | Status: DC | PRN

## 2015-08-28 MED ORDER — INSULIN ASPART 100 UNIT/ML ~~LOC~~ SOLN
0.0000 [IU] | Freq: Three times a day (TID) | SUBCUTANEOUS | Status: DC
  Administered 2015-08-28 – 2015-08-29 (×2): 2 [IU] via SUBCUTANEOUS
  Administered 2015-08-29: 1 [IU] via SUBCUTANEOUS
  Administered 2015-08-29 – 2015-08-31 (×7): 2 [IU] via SUBCUTANEOUS
  Administered 2015-09-01: 1 [IU] via SUBCUTANEOUS
  Administered 2015-09-01: 2 [IU] via SUBCUTANEOUS
  Administered 2015-09-01 – 2015-09-02 (×2): 1 [IU] via SUBCUTANEOUS

## 2015-08-28 MED ORDER — FENTANYL CITRATE (PF) 100 MCG/2ML IJ SOLN
100.0000 ug | Freq: Once | INTRAMUSCULAR | Status: AC
  Administered 2015-08-28: 100 ug via INTRAVENOUS
  Filled 2015-08-28: qty 2

## 2015-08-28 MED ORDER — PANTOPRAZOLE SODIUM 40 MG PO TBEC
40.0000 mg | DELAYED_RELEASE_TABLET | Freq: Every day | ORAL | Status: DC
  Administered 2015-08-28 – 2015-09-02 (×6): 40 mg via ORAL
  Filled 2015-08-28 (×6): qty 1

## 2015-08-28 MED ORDER — OXYCODONE HCL ER 10 MG PO T12A: 10.0000 mg | EXTENDED_RELEASE_TABLET | Freq: Two times a day (BID) | ORAL | Status: DC | PRN

## 2015-08-28 MED ORDER — DOXYCYCLINE HYCLATE 100 MG PO TABS
100.0000 mg | ORAL_TABLET | Freq: Two times a day (BID) | ORAL | Status: DC
  Administered 2015-08-28 – 2015-09-02 (×11): 100 mg via ORAL
  Filled 2015-08-28 (×11): qty 1

## 2015-08-28 MED ORDER — FENTANYL CITRATE (PF) 100 MCG/2ML IJ SOLN: 100.0000 ug | INTRAMUSCULAR | Status: DC | PRN

## 2015-08-28 MED ORDER — FENTANYL CITRATE (PF) 100 MCG/2ML IJ SOLN
100.0000 ug | Freq: Once | INTRAMUSCULAR | Status: AC
Start: 2015-08-28 — End: 2015-08-28
  Administered 2015-08-28: 100 ug via INTRAVENOUS
  Filled 2015-08-28: qty 2

## 2015-08-28 MED ORDER — ATORVASTATIN CALCIUM 40 MG PO TABS
40.0000 mg | ORAL_TABLET | Freq: Every day | ORAL | Status: DC
  Filled 2015-08-28: qty 2

## 2015-08-28 MED ORDER — ONDANSETRON HCL 4 MG PO TABS: 4.0000 mg | ORAL_TABLET | Freq: Four times a day (QID) | ORAL | Status: DC | PRN

## 2015-08-28 MED ORDER — MORPHINE SULFATE (PF) 4 MG/ML IV SOLN
4.0000 mg | INTRAVENOUS | Status: DC | PRN
  Administered 2015-08-28 – 2015-08-30 (×15): 4 mg via INTRAVENOUS
  Filled 2015-08-28 (×15): qty 1

## 2015-08-28 MED ORDER — HYDROMORPHONE HCL 1 MG/ML IJ SOLN
0.5000 mg | Freq: Once | INTRAMUSCULAR | Status: AC
  Administered 2015-08-28: 0.5 mg via INTRAVENOUS
  Filled 2015-08-28: qty 1

## 2015-08-28 MED ORDER — METHOCARBAMOL 500 MG PO TABS
500.0000 mg | ORAL_TABLET | Freq: Four times a day (QID) | ORAL | Status: DC | PRN
  Administered 2015-08-28 – 2015-09-02 (×7): 500 mg via ORAL
  Filled 2015-08-28 (×7): qty 1

## 2015-08-28 MED ORDER — FINASTERIDE 5 MG PO TABS
5.0000 mg | ORAL_TABLET | Freq: Every day | ORAL | Status: DC
  Administered 2015-08-28 – 2015-09-02 (×6): 5 mg via ORAL
  Filled 2015-08-28 (×6): qty 1

## 2015-08-28 NOTE — Progress Notes (Signed)
Report received from Angela,RN for admission to 5W09

## 2015-08-28 NOTE — Progress Notes (Signed)
[name] MelenaGary P Dyer is a 75 y.o. male patient admitted from ED awake, alert - oriented  X 4 - no acute distress noted.  VSS - Blood pressure 129/92, pulse 91, temperature 98.5 F (36.9 C), temperature source Oral, resp. rate 18, height 5\' 7"  (1.702 m), weight 92.534 kg (204 lb), SpO2 94 %.    IV in place, occlusive dsg intact without redness.  Orientation to room, and floor completed with information packet given to patient/family.  Patient declined safety video at this time.  Admission INP armband ID verified with patient/family, and in place.   SR up x 2, fall assessment complete, with patient and family able to verbalize understanding of risk associated with falls, and verbalized understanding to call nsg before up out of bed.  Call light within reach, patient able to voice, and demonstrate understanding.  Bilateral knee bruising. +2 bilateral pedal pulses. No sensation loss noted.    Will cont to eval and treat per MD orders.  Kendall FlackL'ESPERANCE, Ethelreda Sukhu C, RN 08/28/2015 4:31 PM

## 2015-08-28 NOTE — ED Notes (Signed)
Patient transported to X-ray 
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
## 2015-08-28 NOTE — Progress Notes (Signed)
Orthopedic Tech Progress Note Patient Details:  [name] MelenaGary P Dyer Oct 25, 1940 [id number]  Ortho Devices Type of Ortho Device: Knee Immobilizer Ortho Device/Splint Location: LLE Ortho Device/Splint Interventions: Ordered, Application   [name] MoccasinHughes, [name] 08/28/2015, 7:07 PM
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
## 2015-08-29 DIAGNOSIS — S8700XD Crushing injury of unspecified knee, subsequent encounter: Secondary | ICD-10-CM

## 2015-08-29 LAB — CBC
HCT: 35.3 % — ABNORMAL LOW (ref 39.0–52.0)
Hemoglobin: 12 g/dL — ABNORMAL LOW (ref 13.0–17.0)
MCH: 33.1 pg (ref 26.0–34.0)
MCHC: 34 g/dL (ref 30.0–36.0)
MCV: 97.5 fL (ref 78.0–100.0)
Platelets: 217 10*3/uL (ref 150–400)
RBC: 3.62 MIL/uL — ABNORMAL LOW (ref 4.22–5.81)
RDW: 13.6 % (ref 11.5–15.5)
WBC: 7.6 10*3/uL (ref 4.0–10.5)

## 2015-08-29 LAB — BASIC METABOLIC PANEL WITH GFR
Anion gap: 8 (ref 5–15)
BUN: 17 mg/dL (ref 6–20)
CO2: 23 mmol/L (ref 22–32)
Calcium: 8.6 mg/dL — ABNORMAL LOW (ref 8.9–10.3)
Chloride: 105 mmol/L (ref 101–111)
Creatinine, Ser: 0.86 mg/dL (ref 0.61–1.24)
GFR calc Af Amer: >60 mL/min (ref >60)
GFR calc non Af Amer: >60 mL/min (ref >60)
Glucose, Bld: 179 mg/dL — ABNORMAL HIGH (ref 65–99)
Potassium: 3.5 mmol/L (ref 3.5–5.1)
Sodium: 136 mmol/L (ref 135–145)

## 2015-08-29 LAB — GLUCOSE, CAPILLARY
Glucose-Capillary: 181 mg/dL — ABNORMAL HIGH (ref 65–99)
Glucose-Capillary: 148 mg/dL — ABNORMAL HIGH (ref 65–99)
Glucose-Capillary: 190 mg/dL — ABNORMAL HIGH (ref 65–99)
Glucose-Capillary: 202 mg/dL — ABNORMAL HIGH (ref 65–99)

## 2015-08-29 MED ORDER — SENNA 8.6 MG PO TABS
1.0000 | ORAL_TABLET | Freq: Every day | ORAL | Status: DC
  Administered 2015-08-29 – 2015-09-01 (×5): 8.6 mg via ORAL
  Filled 2015-08-29 (×4): qty 1

## 2015-08-29 MED ORDER — POLYETHYLENE GLYCOL 3350 17 G PO PACK
17.0000 g | PACK | Freq: Two times a day (BID) | ORAL | Status: DC
  Administered 2015-08-29 – 2015-09-02 (×10): 17 g via ORAL
  Filled 2015-08-29 (×7): qty 1

## 2015-08-29 MED ORDER — ENOXAPARIN SODIUM 40 MG/0.4ML ~~LOC~~ SOLN
40.0000 mg | SUBCUTANEOUS | Status: DC
  Administered 2015-08-29 – 2015-09-01 (×4): 40 mg via SUBCUTANEOUS
  Filled 2015-08-29 (×4): qty 0.4

## 2015-08-29 MED ORDER — DIPHENHYDRAMINE HCL 25 MG PO CAPS
25.0000 mg | ORAL_CAPSULE | Freq: Four times a day (QID) | ORAL | Status: DC | PRN
  Administered 2015-08-29 – 2015-08-30 (×3): 25 mg via ORAL
  Filled 2015-08-29 (×3): qty 1

## 2015-08-29 MED ORDER — OXYCODONE HCL 5 MG PO TABS
5.0000 mg | ORAL_TABLET | ORAL | Status: DC | PRN
  Administered 2015-08-29 – 2015-09-02 (×6): 5 mg via ORAL
  Filled 2015-08-29 (×6): qty 1

## 2015-08-29 NOTE — NC FL2 (Signed)
[place] MEDICAID FL2 LEVEL OF CARE SCREENING TOOL     IDENTIFICATION  Patient Name: [name] Birthdate: [birth year] Sex: male Admission Date (Current Location): 08/28/2015  [facility] [place]   Facility and Address:  [facility] [place], [address], [place], [place] [address]      Provider Number: [id number]  Attending Physician Name and Address:  Alba CoryBelkys A Regalado, MD  Relative Name and Phone Number:       Current Level of Care: Hospital Recommended Level of Care: Skilled Nursing Facility Prior Approval Number:    Date Approved/Denied:   PASRR Number: [id number]  Discharge Plan: SNF    Current Diagnoses: Patient Active Problem List   Diagnosis Date Noted  . Crush injury knee 08/28/2015  . Diabetes mellitus, type 2 (HCC) 08/28/2015  . Hyperlipidemia 08/28/2015  . GERD (gastroesophageal reflux disease) 08/28/2015  . Crush injury lower leg 08/28/2015  . OA (osteoarthritis) of knee 07/30/2015  . Other seasonal allergic rhinitis 05/27/2015  . OSA on CPAP 05/27/2015  . Obesity 05/27/2015  . S/P lumbar spinal fusion 04/16/2015    Orientation RESPIRATION BLADDER Height & Weight     Self, Time, Situation, Place  Normal Continent Weight: 204 lb (92.534 kg) Height:  5\' 7"  (170.2 cm)  BEHAVIORAL SYMPTOMS/MOOD NEUROLOGICAL BOWEL NUTRITION STATUS      Continent Diet (Heart Healthy / Carb Modified)  AMBULATORY STATUS COMMUNICATION OF NEEDS Skin   Extensive Assist Verbally Normal                       Personal Care Assistance Level of Assistance  Bathing, Feeding, Dressing Bathing Assistance: Limited assistance Feeding assistance: Independent Dressing Assistance: Limited assistance     Functional Limitations Info  Sight, Hearing, Speech Sight Info: Adequate Hearing Info: Adequate Speech Info: Adequate    SPECIAL CARE FACTORS FREQUENCY  PT (By licensed PT), OT (By licensed OT)     PT Frequency: 3 OT  Frequency: 3            Contractures Contractures Info: Not present    Additional Factors Info  Code Status, Allergies, Insulin Sliding Scale Code Status Info: Full Code Allergies Info: No Known Allergies   Insulin Sliding Scale Info: Novolog 3 times daily with meals       Current Medications (08/29/2015):  This is the current hospital active medication list Current Facility-Administered Medications  Medication Dose Route Frequency Provider Last Rate Last Dose  . acetaminophen (TYLENOL) tablet 650 mg  650 mg Oral Q6H PRN Meredith PelPaula M Guenther, NP       Or  . acetaminophen (TYLENOL) suppository 650 mg  650 mg Rectal Q6H PRN Meredith PelPaula M Guenther, NP      . atorvastatin (LIPITOR) tablet 80 mg  80 mg Oral Daily Leatha Gildingostin M Gherghe, MD   80 mg at 08/29/15 0951  . bisacodyl (DULCOLAX) EC tablet 5 mg  5 mg Oral Daily PRN Meredith PelPaula M Guenther, NP      . diphenhydrAMINE (BENADRYL) capsule 25 mg  25 mg Oral Q6H PRN Belkys A Regalado, MD   25 mg at 08/29/15 0952  . docusate sodium (COLACE) capsule 200 mg  200 mg Oral Daily Meredith PelPaula M Guenther, NP   200 mg at 08/29/15 0951  . doxycycline (VIBRA-TABS) tablet 100 mg  100 mg Oral BID Meredith PelPaula M Guenther, NP   100 mg at 08/29/15 0951  . enoxaparin (LOVENOX) injection 40 mg  40 mg Subcutaneous Q24H  Belkys A Regalado, MD      . finasteride (PROSCAR) tablet 5 mg  5 mg Oral Daily Meredith Pel, NP   5 mg at 08/29/15 0951  . insulin aspart (novoLOG) injection 0-9 Units  0-9 Units Subcutaneous TID WC Meredith Pel, NP   1 Units at 08/29/15 1227  . methocarbamol (ROBAXIN) tablet 500 mg  500 mg Oral Q6H PRN Meredith Pel, NP   500 mg at 08/29/15 1610  . morphine 4 MG/ML injection 4 mg  4 mg Intravenous Q2H PRN Leatha Gilding, MD   4 mg at 08/29/15 1558  . ondansetron (ZOFRAN) tablet 4 mg  4 mg Oral Q6H PRN Meredith Pel, NP       Or  . ondansetron [facility]) injection 4 mg  4 mg Intravenous Q6H PRN Meredith Pel, NP      . oxyCODONE (Oxy IR/ROXICODONE)  immediate release tablet 5 mg  5 mg Oral Q4H PRN Belkys A Regalado, MD      . oxyCODONE (OXYCONTIN) 12 hr tablet 10 mg  10 mg Oral q12n4p Meredith Pel, NP   10 mg at 08/29/15 1535  . pantoprazole (PROTONIX) EC tablet 40 mg  40 mg Oral Daily Meredith Pel, NP   40 mg at 08/29/15 0951  . polyethylene glycol (MIRALAX / GLYCOLAX) packet 17 g  17 g Oral BID Belkys A Regalado, MD   17 g at 08/29/15 1626  . senna (SENOKOT) tablet 8.6 mg  1 tablet Oral QHS Belkys A Regalado, MD      . traZODone (DESYREL) tablet 25 mg  25 mg Oral QHS PRN Meredith Pel, NP   25 mg at 08/29/15 0143     Discharge Medications: Please see discharge summary for a list of discharge medications.  Relevant Imaging Results:  Relevant Lab Results:   Additional Information SSN [id number]  [place], [place] [phone]
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
## 2015-08-29 NOTE — Progress Notes (Signed)
Orthopedic Tech Progress Note Patient Details:  [name] MelenaGary P Dyer [birth year] [id number]  Ortho Devices Type of Ortho Device: Knee Immobilizer Ortho Device/Splint Location: Trapeze bar Ortho Device/Splint Interventions: Application   Saul FordyceJennifer C Eagle Pitta 08/29/2015, 1:56 PM

## 2015-08-30 DIAGNOSIS — S8700XD Crushing injury of unspecified knee, subsequent encounter: Secondary | ICD-10-CM | POA: Diagnosis not present

## 2015-08-30 LAB — GLUCOSE, CAPILLARY
Glucose-Capillary: 178 mg/dL — ABNORMAL HIGH (ref 65–99)
Glucose-Capillary: 165 mg/dL — ABNORMAL HIGH (ref 65–99)
Glucose-Capillary: 152 mg/dL — ABNORMAL HIGH (ref 65–99)
Glucose-Capillary: 159 mg/dL — ABNORMAL HIGH (ref 65–99)

## 2015-08-30 MED ORDER — ASPIRIN EC 81 MG PO TBEC
81.0000 mg | DELAYED_RELEASE_TABLET | Freq: Every day | ORAL | Status: DC
  Administered 2015-08-30 – 2015-09-01 (×3): 81 mg via ORAL
  Filled 2015-08-30 (×3): qty 1

## 2015-08-30 MED ORDER — MORPHINE SULFATE (PF) 4 MG/ML IV SOLN
4.0000 mg | INTRAVENOUS | Status: DC | PRN
  Administered 2015-08-30 – 2015-09-01 (×12): 4 mg via INTRAVENOUS
  Filled 2015-08-30 (×12): qty 1

## 2015-08-30 MED ORDER — ATORVASTATIN CALCIUM 80 MG PO TABS
80.0000 mg | ORAL_TABLET | Freq: Every day | ORAL | Status: DC
  Administered 2015-08-31 – 2015-09-01 (×2): 80 mg via ORAL
  Filled 2015-08-30 (×3): qty 1

## 2015-08-30 MED ORDER — KETOROLAC TROMETHAMINE 15 MG/ML IJ SOLN
15.0000 mg | Freq: Four times a day (QID) | INTRAMUSCULAR | Status: DC | PRN
  Administered 2015-08-30 – 2015-09-01 (×6): 15 mg via INTRAVENOUS
  Filled 2015-08-30 (×6): qty 1
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
## 2015-08-30 NOTE — Progress Notes (Signed)
Subjective: Patient complains of pain as expected. Unable to mobilize well   Objective: Vital signs in last 24 hours:.. Temp:  [98.4 F (36.9 C)-98.5 F (36.9 C)] 98.4 F (36.9 C) (07/22 0711) Pulse Rate:  [98-104] 98 (07/22 0711) Resp:  [18] 18 (07/22 0711) BP: (113-123)/(69-81) 123/69 mmHg (07/22 0711) SpO2:  [96 %] 96 % (07/22 0711)  Intake/Output from previous day: 07/21 0701 - 07/22 0700 In: 680 [P.O.:680] Out: 300 [Urine:300] Intake/Output this shift: Total I/O In: 360 [P.O.:360] Out: -    Recent Labs  08/28/15 1204 08/29/15 0550  HGB 13.4 12.0*    Recent Labs  08/28/15 1204 08/29/15 0550  WBC 13.4* 7.6  RBC 4.09* 3.62*  HCT 39.8 35.3*  PLT 216 217    Recent Labs  08/28/15 1204 08/29/15 0550  NA 138 136  K 3.7 3.5  CL 107 105  CO2 23 23  BUN 20 17  CREATININE 0.99 0.86  GLUCOSE 167* 179*  CALCIUM 9.0 8.6*   No results for input(s): LABPT, INR in the last 72 hours.  Sensation intact distally Intact pulses distally Dorsiflexion/Plantar flexion intact Compartment soft  Assessment/Plan: PT hasrec SNF. Continueprevious Ortho recs.   Knee. Immob,NWB left LE. If SNF arranged over weekend please arrange follow up with Dr Lequita Halt for about 2 weeks.   [name] 08/30/2015, 10:25 AM

## 2015-08-31 ENCOUNTER — Inpatient Hospital Stay (HOSPITAL_COMMUNITY): Payer: Medicare Other

## 2015-08-31 DIAGNOSIS — R059 Cough, unspecified: Secondary | ICD-10-CM

## 2015-08-31 DIAGNOSIS — R05 Cough: Secondary | ICD-10-CM

## 2015-08-31 DIAGNOSIS — S82142A Displaced bicondylar fracture of left tibia, initial encounter for closed fracture: Secondary | ICD-10-CM | POA: Diagnosis not present

## 2015-08-31 DIAGNOSIS — S82092A Other fracture of left patella, initial encounter for closed fracture: Secondary | ICD-10-CM | POA: Diagnosis not present

## 2015-08-31 DIAGNOSIS — S8700XD Crushing injury of unspecified knee, subsequent encounter: Secondary | ICD-10-CM | POA: Diagnosis not present

## 2015-08-31 DIAGNOSIS — M9712XA Periprosthetic fracture around internal prosthetic left knee joint, initial encounter: Secondary | ICD-10-CM | POA: Diagnosis not present

## 2015-08-31 DIAGNOSIS — E119 Type 2 diabetes mellitus without complications: Secondary | ICD-10-CM | POA: Diagnosis not present

## 2015-08-31 LAB — BASIC METABOLIC PANEL
Anion gap: 9 (ref 5–15)
BUN: 17 mg/dL (ref 6–20)
CO2: 22 mmol/L (ref 22–32)
Calcium: 8.3 mg/dL — ABNORMAL LOW (ref 8.9–10.3)
Chloride: 104 mmol/L (ref 101–111)
Creatinine, Ser: 0.92 mg/dL (ref 0.61–1.24)
GFR calc Af Amer: >60 mL/min (ref >60)
GFR calc non Af Amer: >60 mL/min (ref >60)
Glucose, Bld: 161 mg/dL — ABNORMAL HIGH (ref 65–99)
Potassium: 3.5 mmol/L (ref 3.5–5.1)
Sodium: 135 mmol/L (ref 135–145)

## 2015-08-31 LAB — GLUCOSE, CAPILLARY
Glucose-Capillary: 197 mg/dL — ABNORMAL HIGH (ref 65–99)
Glucose-Capillary: 171 mg/dL — ABNORMAL HIGH (ref 65–99)
Glucose-Capillary: 159 mg/dL — ABNORMAL HIGH (ref 65–99)
Glucose-Capillary: 177 mg/dL — ABNORMAL HIGH (ref 65–99)

## 2015-08-31 MED ORDER — ALBUTEROL SULFATE (2.5 MG/3ML) 0.083% IN NEBU: 2.5000 mg | INHALATION_SOLUTION | Freq: Four times a day (QID) | RESPIRATORY_TRACT | Status: DC | PRN

## 2015-08-31 MED ORDER — GUAIFENESIN ER 600 MG PO TB12
600.0000 mg | ORAL_TABLET | Freq: Two times a day (BID) | ORAL | Status: DC
  Administered 2015-08-31 – 2015-09-02 (×5): 600 mg via ORAL
  Filled 2015-08-31 (×5): qty 1

## 2015-08-31 NOTE — Progress Notes (Signed)
Subjective: S/p crush injury B/L LE Pain about the same Concerned about bruising in his lower back his wife noticed yesterday when PT was up. He denies pain in the lower back at this time but is concerned. Had surgery on his back in February.   Objective: Vital signs in last 24 hours: Temp:  [98.1 F (36.7 C)-98.3 F (36.8 C)] 98.2 F (36.8 C) (07/23 0558) Pulse Rate:  [88-98] 88 (07/23 0558) Resp:  [16-19] 18 (07/23 0558) BP: (116-129)/(61-67) 116/61 (07/23 0558) SpO2:  [94 %-96 %] 96 % (07/23 0558)  Intake/Output from previous day: 07/22 0701 - 07/23 0700 In: 480 [P.O.:480] Out: 800 [Urine:800] Intake/Output this shift: No intake/output data recorded.   Recent Labs  08/28/15 1204 08/29/15 0550  HGB 13.4 12.0*    Recent Labs  08/28/15 1204 08/29/15 0550  WBC 13.4* 7.6  RBC 4.09* 3.62*  HCT 39.8 35.3*  PLT 216 217    Recent Labs  08/29/15 0550 08/31/15 0540  NA 136 135  K 3.5 3.5  CL 105 104  CO2 23 22  BUN 17 17  CREATININE 0.86 0.92  GLUCOSE 179* 161*  CALCIUM 8.6* 8.3*   No results for input(s): LABPT, INR in the last 72 hours.  Neurologically intact ABD soft Neurovascular intact Sensation intact distally Intact pulses distally Dorsiflexion/Plantar flexion intact No cellulitis present Compartment soft immobilizer in place L leg. No calf pain or sign of DVT  Assessment/Plan: B/L LE crush injury L tibial plateau fx, prior L unicompartmental knee replacement R fibular fx, stable LBP with hx of spinal surgery 5 months ago  Plan xrays Lspine today NWB LLE Will follow up with Dr. Lequita Halt as outpt   Ardeth Repetto M. 08/31/2015, 8:55 AM

## 2015-08-31 NOTE — Procedures (Signed)
Patient refused CPAP.  Stated he does not want to wear while in the hospital.
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
## 2015-09-01 ENCOUNTER — Telehealth: Payer: Self-pay | Admitting: Neurology

## 2015-09-01 DIAGNOSIS — S8700XD Crushing injury of unspecified knee, subsequent encounter: Secondary | ICD-10-CM | POA: Diagnosis not present

## 2015-09-01 LAB — GLUCOSE, CAPILLARY
Glucose-Capillary: 146 mg/dL — ABNORMAL HIGH (ref 65–99)
Glucose-Capillary: 140 mg/dL — ABNORMAL HIGH (ref 65–99)
Glucose-Capillary: 168 mg/dL — ABNORMAL HIGH (ref 65–99)
Glucose-Capillary: 173 mg/dL — ABNORMAL HIGH (ref 65–99)

## 2015-09-01 MED ORDER — ALBUTEROL SULFATE (2.5 MG/3ML) 0.083% IN NEBU: 2.5000 mg | INHALATION_SOLUTION | Freq: Four times a day (QID) | RESPIRATORY_TRACT | 12 refills | Status: DC | PRN

## 2015-09-01 MED ORDER — NALOXEGOL OXALATE 25 MG PO TABS
25.0000 mg | ORAL_TABLET | Freq: Every day | ORAL | Status: DC
  Administered 2015-09-01 – 2015-09-02 (×2): 25 mg via ORAL
  Filled 2015-09-01 (×2): qty 1

## 2015-09-01 MED ORDER — OXYCODONE HCL 5 MG PO TABS: 5.0000 mg | ORAL_TABLET | ORAL | 0 refills | Status: DC | PRN

## 2015-09-01 MED ORDER — ACETAMINOPHEN 325 MG PO TABS: 650.0000 mg | ORAL_TABLET | Freq: Four times a day (QID) | ORAL | 0 refills | Status: DC | PRN

## 2015-09-01 MED ORDER — BISACODYL 5 MG PO TBEC: 5.0000 mg | DELAYED_RELEASE_TABLET | Freq: Every day | ORAL | 0 refills | Status: DC | PRN

## 2015-09-01 MED ORDER — GUAIFENESIN ER 600 MG PO TB12: 600.0000 mg | ORAL_TABLET | Freq: Two times a day (BID) | ORAL | 0 refills | Status: DC

## 2015-09-01 MED ORDER — OXYCODONE HCL ER 10 MG PO T12A: 10.0000 mg | EXTENDED_RELEASE_TABLET | Freq: Two times a day (BID) | ORAL | 0 refills | Status: DC

## 2015-09-01 MED ORDER — MORPHINE SULFATE (PF) 4 MG/ML IV SOLN: 4.0000 mg | INTRAVENOUS | Status: DC | PRN

## 2015-09-01 NOTE — Progress Notes (Signed)
Patient ID: [name], male   DOB: 1940/07/24, 75 y.o.   MRN: [id number]    Subjective:    Patient reports pain as 5 on 0-10 scale.   Denies CP or SOB.  Voiding without difficulty. Positive flatus. Positive BM.  Reports still has opioid constipation Objective: Vital signs in last 24 hours: Temp:  [98.2 F (36.8 C)-98.3 F (36.8 C)] 98.3 F (36.8 C) (07/24 0508) Pulse Rate:  [87-96] 87 (07/24 0508) Resp:  [18] 18 (07/23 1623) BP: (122-133)/(65-73) 128/65 (07/24 0508) SpO2:  [96 %-98 %] 97 % (07/24 0508)  Intake/Output from previous day: 07/23 0701 - 07/24 0700 In: 360 [P.O.:360] Out: 600 [Urine:600] Intake/Output this shift: Total I/O In: 300 [P.O.:300] Out: 250 [Urine:250]  Labs: No results for input(s): HGB in the last 72 hours. No results for input(s): WBC, RBC, HCT, PLT in the last 72 hours.  Recent Labs  08/31/15 0540  NA 135  K 3.5  CL 104  CO2 22  BUN 17  CREATININE 0.92  GLUCOSE 161*  CALCIUM 8.3*   No results for input(s): LABPT, INR in the last 72 hours.  Physical Exam: Neurologically intact ABD soft Sensation intact distally Dorsiflexion/Plantar flexion intact Compartment soft  Assessment/Plan:    Consulted with Dr. Berton Lan - he will come by and see pt later today or tomorrow Started medication for opioid constipation  [facility], Baxter Kail for Dr. Venita Lick [facility] [phone] 09/01/2015, 12:06 PM
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
## 2015-09-02 ENCOUNTER — Ambulatory Visit: Payer: Self-pay | Admitting: Neurology

## 2015-09-02 LAB — GLUCOSE, CAPILLARY
Glucose-Capillary: 142 mg/dL — ABNORMAL HIGH (ref 65–99)
Glucose-Capillary: 134 mg/dL — ABNORMAL HIGH (ref 65–99)

## 2015-09-02 MED ORDER — METHOCARBAMOL 500 MG PO TABS: 500.0000 mg | ORAL_TABLET | Freq: Four times a day (QID) | ORAL | 0 refills | Status: DC | PRN

## 2015-09-02 MED ORDER — NALOXEGOL OXALATE 25 MG PO TABS: 25.0000 mg | ORAL_TABLET | Freq: Every day | ORAL | 0 refills | Status: DC

## 2015-09-02 NOTE — Progress Notes (Signed)
Patient ID: OMEED KLEPPE, male   DOB: Jan 26, 1941, 75 y.o.   MRN: [id number]    Subjective:    Patient reports pain as 4 on 0-10 scale.   Denies CP or SOB.  Voiding without difficulty. Positive BM. Objective: Vital signs in last 24 hours: Temp:  [98.4 F (36.9 C)-98.6 F (37 C)] 98.4 F (36.9 C) (07/25 0553) Pulse Rate:  [84-95] 85 (07/25 0553) Resp:  [17-18] 18 (07/25 0553) BP: (123-145)/(59-65) 145/60 (07/25 0553) SpO2:  [96 %-97 %] 97 % (07/25 0553)  Intake/Output from previous day: 07/24 0701 - 07/25 0700 In: 650 [P.O.:650] Out: 1775 [Urine:1775] Intake/Output this shift: No intake/output data recorded.  Labs: No results for input(s): HGB in the last 72 hours. No results for input(s): WBC, RBC, HCT, PLT in the last 72 hours.  Recent Labs  08/31/15 0540  NA 135  K 3.5  CL 104  CO2 22  BUN 17  CREATININE 0.92  GLUCOSE 161*  CALCIUM 8.3*   No results for input(s): LABPT, INR in the last 72 hours.  Physical Exam: Neurologically intact ABD soft Sensation intact distally Intact pulses distally Dorsiflexion/Plantar flexion intact Compartment soft  Assessment/Plan:    1/Pt is being discharged to SNF to continue to heal from trama 2/Pt will present in clinic in 2 weeks to see Dr. Berton Lan about on going care/future surgical intervension left knee.  Continue to wear the knee immobilizer 3/Post surgical changes noted in the pts lumbar xray of one of the sacral screw. Pt will consult with his Neurosurgeon  [place], Baxter [place] for Dr. Venita Lick [facility] [phone] 09/02/2015, 7:10 AM
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
## 2015-09-02 NOTE — Progress Notes (Signed)
Patient will discharge to [facility] Anticipated discharge date: 7/25 Family notified:wife Transportation by [facility]- set up for 12pm  CSW signing off.  Merlyn Lot, LCSWA Clinical Social Worker [phone]

## 2015-09-02 NOTE — Care Management Important Message (Signed)
Important Message  Patient Details  Name: [name] MRN: [id number] Date of Birth: Jun 19, 1940   Medicare Important Message Given:  Yes    Kyla Balzarine 09/02/2015, 10:38 AM

## 2015-09-02 NOTE — Clinical Social Work Placement (Signed)
   CLINICAL SOCIAL WORK PLACEMENT  NOTE  Date:  09/02/2015  Patient Details  Name: [name] MRN: [id number] Date of Birth: 03/30/1940  Clinical Social Work is seeking post-discharge placement for this patient at the Skilled  Nursing Facility level of care (*CSW will initial, date and re-position this form in  chart as items are completed):  Yes   Patient/family provided with [place] Clinical Social Work Department's list of facilities offering this level of care within the geographic area requested by the patient (or if unable, by the patient's family).  Yes   Patient/family informed of their freedom to choose among providers that offer the needed level of care, that participate in Medicare, Medicaid or managed care program needed by the patient, have an available bed and are willing to accept the patient.  Yes   Patient/family informed of [place]'s ownership interest in [facility] and [facility], as well as of the fact that they are under no obligation to receive care at these facilities.  PASRR submitted to EDS on 08/29/15     PASRR number received on 08/29/15     Existing PASRR number confirmed on       FL2 transmitted to all facilities in geographic area requested by pt/family on 08/29/15     FL2 transmitted to all facilities within larger geographic area on       Patient informed that his/her managed care company has contracts with or will negotiate with certain facilities, including the following:        Yes   Patient/family informed of bed offers received.  Patient chooses bed at [facility]     Physician recommends and patient chooses bed at      Patient to be transferred to [facility] on 09/02/15.  Patient to be transferred to facility by ptar     Patient family notified on 09/02/15 of transfer.  Name of family member notified:  wife at bedside     PHYSICIAN Please sign FL2     Additional Comment:     _______________________________________________ Izora Ribas, LCSW 09/02/2015, 11:18 AM

## 2015-09-02 NOTE — Progress Notes (Signed)
Pt will be discharged to [facility] via [facility]. Pt and wife in understanding. Transport arranged for 12pm.

## 2015-09-02 NOTE — Care Management Note (Signed)
Case Management Note  Patient Details  Name: [name] MRN: [id number] Date of Birth: 10-24-1940  Subjective/Objective:                    Action/Plan:  Patient to DC to SNF today.  Expected Discharge Date:                  Expected Discharge Plan:  Skilled Nursing Facility  In-House Referral:  Clinical Social Work  Discharge planning Services  CM Consult  Post Acute Care Choice:    Choice offered to:     DME Arranged:    DME Agency:     HH Arranged:    HH Agency:     Status of Service:  Completed, signed off  If discussed at [employer], dates discussed:    Additional Comments:  Lawerance Sabal, RN 09/02/2015, 11:21 AM

## 2015-09-04 ENCOUNTER — Encounter: Payer: Self-pay | Admitting: Internal Medicine

## 2015-09-04 ENCOUNTER — Non-Acute Institutional Stay (SKILLED_NURSING_FACILITY): Payer: Medicare Other | Admitting: Internal Medicine

## 2015-09-04 DIAGNOSIS — S82401S Unspecified fracture of shaft of right fibula, sequela: Secondary | ICD-10-CM

## 2015-09-04 DIAGNOSIS — K5900 Constipation, unspecified: Secondary | ICD-10-CM

## 2015-09-04 DIAGNOSIS — N400 Enlarged prostate without lower urinary tract symptoms: Secondary | ICD-10-CM | POA: Diagnosis not present

## 2015-09-04 DIAGNOSIS — R2681 Unsteadiness on feet: Secondary | ICD-10-CM | POA: Diagnosis not present

## 2015-09-04 DIAGNOSIS — S82002S Unspecified fracture of left patella, sequela: Secondary | ICD-10-CM

## 2015-09-04 DIAGNOSIS — S82202S Unspecified fracture of shaft of left tibia, sequela: Secondary | ICD-10-CM

## 2015-09-04 DIAGNOSIS — K219 Gastro-esophageal reflux disease without esophagitis: Secondary | ICD-10-CM | POA: Diagnosis not present

## 2015-09-04 DIAGNOSIS — E119 Type 2 diabetes mellitus without complications: Secondary | ICD-10-CM | POA: Diagnosis not present

## 2015-09-04 DIAGNOSIS — G4733 Obstructive sleep apnea (adult) (pediatric): Secondary | ICD-10-CM | POA: Diagnosis not present

## 2015-09-04 DIAGNOSIS — R030 Elevated blood-pressure reading, without diagnosis of hypertension: Secondary | ICD-10-CM | POA: Diagnosis not present

## 2015-09-04 DIAGNOSIS — E785 Hyperlipidemia, unspecified: Secondary | ICD-10-CM

## 2015-09-04 DIAGNOSIS — IMO0001 Reserved for inherently not codable concepts without codable children: Secondary | ICD-10-CM

## 2015-09-04 DIAGNOSIS — D649 Anemia, unspecified: Secondary | ICD-10-CM | POA: Diagnosis not present

## 2015-09-04 DIAGNOSIS — Z9989 Dependence on other enabling machines and devices: Secondary | ICD-10-CM
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
## 2015-09-08 LAB — CBC AND DIFFERENTIAL
HCT: 29 % — AB (ref 41–53)
Hemoglobin: 10.1 g/dL — AB (ref 13.5–17.5)
Neutrophils Absolute: 5 /uL
Platelets: 272 10*3/uL (ref 150–399)
WBC: 8 10^3/mL

## 2015-09-08 LAB — HEPATIC FUNCTION PANEL
ALT: 12 U/L (ref 10–40)
AST: 12 U/L — AB (ref 14–40)
Alkaline Phosphatase: 66 U/L (ref 25–125)
Bilirubin, Total: 1.2 mg/dL

## 2015-09-08 LAB — BASIC METABOLIC PANEL
BUN: 14 mg/dL (ref 4–21)
Creatinine: 0.8 mg/dL (ref 0.6–1.3)
Glucose: 141 mg/dL
Potassium: 4.5 mmol/L (ref 3.4–5.3)
Sodium: 144 mmol/L (ref 137–147)

## 2015-09-15 ENCOUNTER — Encounter: Payer: Self-pay | Admitting: Adult Health

## 2015-09-15 ENCOUNTER — Other Ambulatory Visit: Payer: Self-pay | Admitting: Surgical

## 2015-09-15 ENCOUNTER — Non-Acute Institutional Stay (SKILLED_NURSING_FACILITY): Payer: Medicare Other | Admitting: Adult Health

## 2015-09-15 DIAGNOSIS — S82401S Unspecified fracture of shaft of right fibula, sequela: Secondary | ICD-10-CM | POA: Diagnosis not present

## 2015-09-15 DIAGNOSIS — R2681 Unsteadiness on feet: Secondary | ICD-10-CM

## 2015-09-15 DIAGNOSIS — E119 Type 2 diabetes mellitus without complications: Secondary | ICD-10-CM | POA: Diagnosis not present

## 2015-09-15 DIAGNOSIS — E785 Hyperlipidemia, unspecified: Secondary | ICD-10-CM

## 2015-09-15 DIAGNOSIS — G4733 Obstructive sleep apnea (adult) (pediatric): Secondary | ICD-10-CM

## 2015-09-15 DIAGNOSIS — K5900 Constipation, unspecified: Secondary | ICD-10-CM

## 2015-09-15 DIAGNOSIS — S82002S Unspecified fracture of left patella, sequela: Secondary | ICD-10-CM

## 2015-09-15 DIAGNOSIS — D649 Anemia, unspecified: Secondary | ICD-10-CM

## 2015-09-15 DIAGNOSIS — N400 Enlarged prostate without lower urinary tract symptoms: Secondary | ICD-10-CM

## 2015-09-15 DIAGNOSIS — Z9989 Dependence on other enabling machines and devices: Secondary | ICD-10-CM

## 2015-09-15 DIAGNOSIS — K219 Gastro-esophageal reflux disease without esophagitis: Secondary | ICD-10-CM

## 2015-09-15 DIAGNOSIS — S82202S Unspecified fracture of shaft of left tibia, sequela: Secondary | ICD-10-CM
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
## 2015-09-16 ENCOUNTER — Encounter (HOSPITAL_COMMUNITY): Payer: Self-pay | Admitting: *Deleted

## 2015-09-16 NOTE — Progress Notes (Addendum)
Preop instructions for:  [name] MelenaGARY P Mcaleer                    Date of Birth   [birth year]                         Date of Procedure: 09/24/2015      Doctor:  Dr Ollen GrossFrank Aluisio [facility]: [facility] [address], [place]; [facility] to [facility] to [facility] Stay  Any procedure time changes, MD office will notify you!  BRING CPAP MASK AND TUBING DAY OF SURGERY   Do not eat or drink past midnight the night before your procedure.(To include any tube feedings-must be discontinued) BUT MAY TAKE CLEAR LIQUID DIET TILL 9:00 AM DAY OF SURGERY THEN NOTHING BY MOUTH.  Reminder:Follow bowel prep instructions per MD office!   Take these morning medications only with sips of water.(or give through gastrostomy or feeding tube).   Omeprazole (Prilosec); Oxycodone if needed; May use Albuterol Nebulizer Treatment if needed   Note: No Insulin or Diabetic meds should be given or taken the morning of the procedure!   Facility contact: [facility]              Phone:   [phone]   Health Care POA: NONE  Transportation contact phone#:  [facility] [phone]  Please send day of procedure:current med list and meds last taken that day, confirm nothing by mouth status from what time, Patient Demographic info( to include DNR status, problem list, allergies)   [place] SHORT STAY [phone]  Bring Insurance card and picture ID Leave all jewelry and other valuables at place where living( no metal or rings to be worn) No contact lens FOLLOW CHLORHEXIDINE SHOWER INSTRUCTIONS NIGHT PRIOR AND MORNING OF SURGERY  Men-no colognes,lotions     [facility]                   Phone:[phone]                   Fax:[phone]  Sent by :RN: Kathe BectonNYA Gwendlyon Zumbro RN BSN

## 2015-09-17 NOTE — Progress Notes (Signed)
Please clarify surgical consent-abbreviations noted. Thanks.

## 2015-09-18 ENCOUNTER — Other Ambulatory Visit: Payer: Self-pay | Admitting: Surgical

## 2015-09-18 ENCOUNTER — Encounter (HOSPITAL_COMMUNITY)
Admission: RE | Admit: 2015-09-18 | Discharge: 2015-09-18 | Disposition: A | Discharge status: Home / Self Care | Payer: Medicare Other | Source: Ambulatory Visit | Attending: Orthopedic Surgery | Admitting: Orthopedic Surgery

## 2015-09-18 ENCOUNTER — Encounter (HOSPITAL_COMMUNITY): Payer: Self-pay

## 2015-09-18 ENCOUNTER — Other Ambulatory Visit (HOSPITAL_COMMUNITY): Payer: Medicare Other

## 2015-09-18 DIAGNOSIS — Z01810 Encounter for preprocedural cardiovascular examination: Secondary | ICD-10-CM | POA: Insufficient documentation

## 2015-09-18 DIAGNOSIS — Z01812 Encounter for preprocedural laboratory examination: Secondary | ICD-10-CM | POA: Diagnosis present

## 2015-09-18 LAB — PROTIME-INR
INR: 0.98
Prothrombin Time: 13 seconds (ref 11.4–15.2)

## 2015-09-18 LAB — CBC WITH DIFFERENTIAL/PLATELET
Basophils Absolute: 0 10*3/uL (ref 0.0–0.1)
Basophils Relative: 0 %
Eosinophils Absolute: 0.2 10*3/uL (ref 0.0–0.7)
Eosinophils Relative: 3 %
HCT: 36.4 % — ABNORMAL LOW (ref 39.0–52.0)
Hemoglobin: 12.2 g/dL — ABNORMAL LOW (ref 13.0–17.0)
Lymphocytes Relative: 18 %
Lymphs Abs: 1.3 10*3/uL (ref 0.7–4.0)
MCH: 33.9 pg (ref 26.0–34.0)
MCHC: 33.5 g/dL (ref 30.0–36.0)
MCV: 101.1 fL — ABNORMAL HIGH (ref 78.0–100.0)
Monocytes Absolute: 0.8 10*3/uL (ref 0.1–1.0)
Monocytes Relative: 11 %
Neutro Abs: 4.9 10*3/uL (ref 1.7–7.7)
Neutrophils Relative %: 68 %
Platelets: 396 10*3/uL (ref 150–400)
RBC: 3.6 MIL/uL — ABNORMAL LOW (ref 4.22–5.81)
RDW: 14.3 % (ref 11.5–15.5)
WBC: 7.2 10*3/uL (ref 4.0–10.5)

## 2015-09-18 LAB — SURGICAL PCR SCREEN
MRSA, PCR: NEGATIVE
Staphylococcus aureus: NEGATIVE

## 2015-09-18 NOTE — Progress Notes (Signed)
CBCD and CMP results per chart 09/08/2015

## 2015-09-18 NOTE — Patient Instructions (Signed)
[name] MelenaGary P Dyer  09/18/2015   Your procedure is scheduled on: Wednesday September 24, 2015  Report to [facility]  Entrance take [place]  elevators to 3rd floor to  Short Stay Center at 12:00 NOON.  Call this number if you have problems the morning of surgery [phone]   Remember: ONLY 1 PERSON MAY GO WITH YOU TO SHORT STAY TO GET  READY MORNING OF YOUR SURGERY.  Do not eat food After Midnight but may take clear liquid diet till 9:00 am day of surgery then nothing by mouth.      Take these medicines the morning of surgery with A SIP OF WATER: Oxycodone if needed  DO NOT TAKE ANY DIABETIC MEDICATIONS DAY OF YOUR SURGERY                               You may not have any metal on your body including hair pins and              piercings  Do not wear jewelry, lotions, powders or colognes, deodorant                           Men may shave face and neck.   Do not bring valuables to the hospital. [facility] IS NOT             RESPONSIBLE   FOR VALUABLES.  Contacts, dentures or bridgework may not be worn into surgery.  Leave suitcase in the car. After surgery it may be brought to your room.    Special Instructions: NO SMOKING 24 HOURS PRIOR TO SURGERY; BRING CPAP MASK AND TUBING WITH YOU DAY OF SURGERY               Please read over the following fact sheets you were given:MRSA INFORMATION SHEET; INCENTIVE SPIROMETER; BLOOD TRANSFUSION INFORMATION SHEET  _____________________________________________________________________             [facility] - Preparing for Surgery Before surgery, you can play an important role.  Because skin is not sterile, your skin needs to be as free of germs as possible.  You can reduce the number of germs on your skin by washing with CHG (chlorahexidine gluconate) soap before surgery.  CHG is an antiseptic cleaner which kills germs and bonds with the skin to continue killing germs even after washing. Please DO NOT use if you have an  allergy to CHG or antibacterial soaps.  If your skin becomes reddened/irritated stop using the CHG and inform your nurse when you arrive at Short Stay. Do not shave (including legs and underarms) for at least 48 hours prior to the first CHG shower.  You may shave your face/neck. Please follow these instructions carefully:  1.  Shower with CHG Soap the night before surgery and the  morning of Surgery.  2.  If you choose to wash your hair, wash your hair first as usual with your  normal  shampoo.  3.  After you shampoo, rinse your hair and body thoroughly to remove the  shampoo.                           4.  Use CHG as you would any other liquid soap.  You can apply chg directly  to the skin and wash                       Gently with a scrungie or clean washcloth.  5.  Apply the CHG Soap to your body ONLY FROM THE NECK DOWN.   Do not use on face/ open                           Wound or open sores. Avoid contact with eyes, ears mouth and genitals (private parts).                       Wash face,  Genitals (private parts) with your normal soap.             6.  Wash thoroughly, paying special attention to the area where your surgery  will be performed.  7.  Thoroughly rinse your body with warm water from the neck down.  8.  DO NOT shower/wash with your normal soap after using and rinsing off  the CHG Soap.                9.  Pat yourself dry with a clean towel.            10.  Wear clean pajamas.            11.  Place clean sheets on your bed the night of your first shower and do not  sleep with pets. Day of Surgery : Do not apply any lotions/deodorants the morning of surgery.  Please wear clean clothes to the hospital/surgery center.  FAILURE TO FOLLOW THESE INSTRUCTIONS MAY RESULT IN THE CANCELLATION OF YOUR SURGERY PATIENT SIGNATURE_________________________________  NURSE  SIGNATURE__________________________________  ________________________________________________________________________    CLEAR LIQUID DIET   Foods Allowed                                                                     Foods Excluded  Coffee and tea, regular and decaf                             liquids that you cannot  Plain Jell-O in any flavor                                             see through such as: Fruit ices (not with fruit pulp)                                     milk, soups, orange juice  Iced Popsicles                                    All solid food Carbonated beverages, regular and diet  Cranberry, grape and apple juices Sports drinks like Gatorade Lightly seasoned clear broth or consume(fat free) Sugar, honey syrup  Sample Menu Breakfast                                Lunch                                     Supper Cranberry juice                    Beef broth                            Chicken broth Jell-O                                     Grape juice                           Apple juice Coffee or tea                        Jell-O                                      Popsicle                                                Coffee or tea                        Coffee or tea  _____________________________________________________________________    Incentive Spirometer  An incentive spirometer is a tool that can help keep your lungs clear and active. This tool measures how well you are filling your lungs with each breath. Taking long deep breaths may help reverse or decrease the chance of developing breathing (pulmonary) problems (especially infection) following:  A long period of time when you are unable to move or be active. BEFORE THE PROCEDURE   If the spirometer includes an indicator to show your best effort, your nurse or respiratory therapist will set it to a desired goal.  If possible, sit up straight or lean  slightly forward. Try not to slouch.  Hold the incentive spirometer in an upright position. INSTRUCTIONS FOR USE  1. Sit on the edge of your bed if possible, or sit up as far as you can in bed or on a chair. 2. Hold the incentive spirometer in an upright position. 3. Breathe out normally. 4. Place the mouthpiece in your mouth and seal your lips tightly around it. 5. Breathe in slowly and as deeply as possible, raising the piston or the ball toward the top of the column. 6. Hold your breath for 3-5 seconds or for as long as possible. Allow the piston or ball to fall to the bottom of the column. 7. Remove the mouthpiece from your mouth and breathe out normally. 8. Rest for a few seconds and repeat Steps 1 through 7 at  least 10 times every 1-2 hours when you are awake. Take your time and take a few normal breaths between deep breaths. 9. The spirometer may include an indicator to show your best effort. Use the indicator as a goal to work toward during each repetition. 10. After each set of 10 deep breaths, practice coughing to be sure your lungs are clear. If you have an incision (the cut made at the time of surgery), support your incision when coughing by placing a pillow or rolled up towels firmly against it. Once you are able to get out of bed, walk around indoors and cough well. You may stop using the incentive spirometer when instructed by your caregiver.  RISKS AND COMPLICATIONS  Take your time so you do not get dizzy or light-headed.  If you are in pain, you may need to take or ask for pain medication before doing incentive spirometry. It is harder to take a deep breath if you are having pain. AFTER USE  Rest and breathe slowly and easily.  It can be helpful to keep track of a log of your progress. Your caregiver can provide you with a simple table to help with this. If you are using the spirometer at home, follow these instructions: SEEK MEDICAL CARE IF:   You are having difficultly  using the spirometer.  You have trouble using the spirometer as often as instructed.  Your pain medication is not giving enough relief while using the spirometer.  You develop fever of 100.5 F (38.1 C) or higher. SEEK IMMEDIATE MEDICAL CARE IF:   You cough up bloody sputum that had not been present before.  You develop fever of 102 F (38.9 C) or greater.  You develop worsening pain at or near the incision site. MAKE SURE YOU:   Understand these instructions.  Will watch your condition.  Will get help right away if you are not doing well or get worse. Document Released: 06/07/2006 Document Revised: 04/19/2011 Document Reviewed: 08/08/2006 Exit[employer] Patient Information 2014 ExitCare, [place].   ________________________________________________________________________  WHAT IS A BLOOD TRANSFUSION? Blood Transfusion Information  A transfusion is the replacement of blood or some of its parts. Blood is made up of multiple cells which provide different functions.  Red blood cells carry oxygen and are used for blood loss replacement.  White blood cells fight against infection.  Platelets control bleeding.  Plasma helps clot blood.  Other blood products are available for specialized needs, such as hemophilia or other clotting disorders. BEFORE THE TRANSFUSION  Who gives blood for transfusions?   Healthy volunteers who are fully evaluated to make sure their blood is safe. This is blood bank blood. Transfusion therapy is the safest it has ever been in the practice of medicine. Before blood is taken from a donor, a complete history is taken to make sure that person has no history of diseases nor engages in risky social behavior (examples are intravenous drug use or sexual activity with multiple partners). The donor's travel history is screened to minimize risk of transmitting infections, such as malaria. The donated blood is tested for signs of infectious diseases, such as HIV and  hepatitis. The blood is then tested to be sure it is compatible with you in order to minimize the chance of a transfusion reaction. If you or a relative donates blood, this is often done in anticipation of surgery and is not appropriate for emergency situations. It takes many days to process the donated blood. RISKS AND COMPLICATIONS Although transfusion  therapy is very safe and saves many lives, the main dangers of transfusion include:   Getting an infectious disease.  Developing a transfusion reaction. This is an allergic reaction to something in the blood you were given. Every precaution is taken to prevent this. The decision to have a blood transfusion has been considered carefully by your caregiver before blood is given. Blood is not given unless the benefits outweigh the risks. AFTER THE TRANSFUSION  Right after receiving a blood transfusion, you will usually feel much better and more energetic. This is especially true if your red blood cells have gotten low (anemic). The transfusion raises the level of the red blood cells which carry oxygen, and this usually causes an energy increase.  The nurse administering the transfusion will monitor you carefully for complications. HOME CARE INSTRUCTIONS  No special instructions are needed after a transfusion. You may find your energy is better. Speak with your caregiver about any limitations on activity for underlying diseases you may have. SEEK MEDICAL CARE IF:   Your condition is not improving after your transfusion.  You develop redness or irritation at the intravenous (IV) site. SEEK IMMEDIATE MEDICAL CARE IF:  Any of the following symptoms occur over the next 12 hours:  Shaking chills.  You have a temperature by mouth above 102 F (38.9 C), not controlled by medicine.  Chest, back, or muscle pain.  People around you feel you are not acting correctly or are confused.  Shortness of breath or difficulty breathing.  Dizziness and  fainting.  You get a rash or develop hives.  You have a decrease in urine output.  Your urine turns a dark color or changes to pink, red, or brown. Any of the following symptoms occur over the next 10 days:  You have a temperature by mouth above 102 F (38.9 C), not controlled by medicine.  Shortness of breath.  Weakness after normal activity.  The white part of the eye turns yellow (jaundice).  You have a decrease in the amount of urine or are urinating less often.  Your urine turns a dark color or changes to pink, red, or brown. Document Released: 01/23/2000 Document Revised: 04/19/2011 Document Reviewed: 09/11/2007 [facility] Patient Information 2014 [place], [place].  _______________________________________________________________________

## 2015-09-19 LAB — HEMOGLOBIN A1C
Hgb A1c MFr Bld: 6.2 % — ABNORMAL HIGH (ref 4.8–5.6)
Mean Plasma Glucose: 131 mg/dL

## 2015-09-23 ENCOUNTER — Ambulatory Visit: Payer: Self-pay | Admitting: Orthopedic Surgery

## 2015-09-24 ENCOUNTER — Encounter (HOSPITAL_COMMUNITY): Payer: Self-pay | Admitting: *Deleted

## 2015-09-24 ENCOUNTER — Inpatient Hospital Stay (HOSPITAL_COMMUNITY): Payer: Medicare Other | Admitting: Anesthesiology

## 2015-09-24 ENCOUNTER — Encounter (HOSPITAL_COMMUNITY)
Admission: RE | Disposition: A | Discharge status: Home Health | Payer: Self-pay | Source: Ambulatory Visit | Attending: Orthopedic Surgery

## 2015-09-24 ENCOUNTER — Inpatient Hospital Stay (HOSPITAL_COMMUNITY): Payer: Medicare Other

## 2015-09-24 ENCOUNTER — Inpatient Hospital Stay (HOSPITAL_COMMUNITY)
Admission: RE | Admit: 2015-09-24 | Discharge: 2015-09-26 | DRG: 494 | Disposition: A | Discharge status: Home Health | Payer: Medicare Other | Source: Ambulatory Visit | Attending: Orthopedic Surgery | Admitting: Orthopedic Surgery

## 2015-09-24 DIAGNOSIS — Z96652 Presence of left artificial knee joint: Secondary | ICD-10-CM | POA: Diagnosis present

## 2015-09-24 DIAGNOSIS — Z6832 Body mass index (BMI) 32.0-32.9, adult: Secondary | ICD-10-CM | POA: Diagnosis not present

## 2015-09-24 DIAGNOSIS — K219 Gastro-esophageal reflux disease without esophagitis: Secondary | ICD-10-CM | POA: Diagnosis present

## 2015-09-24 DIAGNOSIS — M25562 Pain in left knee: Secondary | ICD-10-CM | POA: Diagnosis present

## 2015-09-24 DIAGNOSIS — E785 Hyperlipidemia, unspecified: Secondary | ICD-10-CM | POA: Diagnosis present

## 2015-09-24 DIAGNOSIS — E119 Type 2 diabetes mellitus without complications: Secondary | ICD-10-CM | POA: Diagnosis present

## 2015-09-24 DIAGNOSIS — H9190 Unspecified hearing loss, unspecified ear: Secondary | ICD-10-CM | POA: Diagnosis present

## 2015-09-24 DIAGNOSIS — Z87891 Personal history of nicotine dependence: Secondary | ICD-10-CM | POA: Diagnosis not present

## 2015-09-24 DIAGNOSIS — M978XXA Periprosthetic fracture around other internal prosthetic joint, initial encounter: Principal | ICD-10-CM

## 2015-09-24 DIAGNOSIS — Z419 Encounter for procedure for purposes other than remedying health state, unspecified: Secondary | ICD-10-CM

## 2015-09-24 DIAGNOSIS — Z96659 Presence of unspecified artificial knee joint: Secondary | ICD-10-CM

## 2015-09-24 DIAGNOSIS — G4733 Obstructive sleep apnea (adult) (pediatric): Secondary | ICD-10-CM | POA: Diagnosis present

## 2015-09-24 HISTORY — PX: ORIF TIBIA PLATEAU: SHX2132

## 2015-09-24 HISTORY — DX: Crushing injury of unspecified knee, initial encounter: S87.00XA

## 2015-09-24 HISTORY — DX: Unspecified fracture of shaft of left tibia, initial encounter for closed fracture: S82.202A

## 2015-09-24 HISTORY — DX: Other fracture of left patella, initial encounter for closed fracture: S82.092A

## 2015-09-24 HISTORY — DX: Unsteadiness on feet: R26.81

## 2015-09-24 LAB — GLUCOSE, CAPILLARY
Glucose-Capillary: 131 mg/dL — ABNORMAL HIGH (ref 65–99)
Glucose-Capillary: 151 mg/dL — ABNORMAL HIGH (ref 65–99)
Glucose-Capillary: 236 mg/dL — ABNORMAL HIGH (ref 65–99)

## 2015-09-24 LAB — CBC
HCT: 35.1 % — ABNORMAL LOW (ref 39.0–52.0)
Hemoglobin: 11.6 g/dL — ABNORMAL LOW (ref 13.0–17.0)
MCH: 32.3 pg (ref 26.0–34.0)
MCHC: 33 g/dL (ref 30.0–36.0)
MCV: 97.8 fL (ref 78.0–100.0)
Platelets: 232 10*3/uL (ref 150–400)
RBC: 3.59 MIL/uL — ABNORMAL LOW (ref 4.22–5.81)
RDW: 14.1 % (ref 11.5–15.5)
WBC: 11.3 10*3/uL — ABNORMAL HIGH (ref 4.0–10.5)

## 2015-09-24 LAB — TYPE AND SCREEN
ABO/RH(D): A POS
Antibody Screen: POSITIVE
DAT, IgG: NEGATIVE

## 2015-09-24 LAB — CREATININE, SERUM
Creatinine, Ser: 0.8 mg/dL (ref 0.61–1.24)
GFR calc Af Amer: >60 mL/min (ref >60)
GFR calc non Af Amer: >60 mL/min (ref >60)

## 2015-09-24 SURGERY — OPEN REDUCTION INTERNAL FIXATION (ORIF) TIBIAL PLATEAU
Anesthesia: General | Laterality: Left

## 2015-09-24 MED ORDER — HYDROMORPHONE HCL 1 MG/ML IJ SOLN
INTRAMUSCULAR | Status: AC
  Administered 2015-09-24: 0.5 mg via INTRAVENOUS
  Filled 2015-09-24: qty 1

## 2015-09-24 MED ORDER — ENOXAPARIN SODIUM 40 MG/0.4ML ~~LOC~~ SOLN
40.0000 mg | SUBCUTANEOUS | Status: DC
  Administered 2015-09-25 – 2015-09-26 (×2): 40 mg via SUBCUTANEOUS
  Filled 2015-09-24 (×2): qty 0.4

## 2015-09-24 MED ORDER — METOCLOPRAMIDE HCL 5 MG PO TABS: 5.0000 mg | ORAL_TABLET | Freq: Three times a day (TID) | ORAL | Status: DC | PRN

## 2015-09-24 MED ORDER — PANTOPRAZOLE SODIUM 40 MG PO TBEC
40.0000 mg | DELAYED_RELEASE_TABLET | Freq: Every day | ORAL | Status: DC
  Administered 2015-09-25 – 2015-09-26 (×2): 40 mg via ORAL
  Filled 2015-09-24 (×2): qty 1

## 2015-09-24 MED ORDER — INSULIN ASPART 100 UNIT/ML ~~LOC~~ SOLN
0.0000 [IU] | Freq: Three times a day (TID) | SUBCUTANEOUS | Status: DC
  Administered 2015-09-25: 2 [IU] via SUBCUTANEOUS

## 2015-09-24 MED ORDER — BUPIVACAINE LIPOSOME 1.3 % IJ SUSP
20.0000 mL | Freq: Once | INTRAMUSCULAR | Status: DC
  Filled 2015-09-24: qty 20

## 2015-09-24 MED ORDER — DIPHENHYDRAMINE HCL 25 MG PO CAPS
25.0000 mg | ORAL_CAPSULE | Freq: Four times a day (QID) | ORAL | Status: DC | PRN
  Administered 2015-09-24: 25 mg via ORAL
  Filled 2015-09-24: qty 1

## 2015-09-24 MED ORDER — IBUPROFEN 400 MG PO TABS: 800.0000 mg | ORAL_TABLET | Freq: Four times a day (QID) | ORAL | Status: DC | PRN

## 2015-09-24 MED ORDER — SUCCINYLCHOLINE CHLORIDE 20 MG/ML IJ SOLN
INTRAMUSCULAR | Status: DC | PRN
  Administered 2015-09-24: 120 mg via INTRAVENOUS

## 2015-09-24 MED ORDER — OXYCODONE HCL 5 MG PO TABS
5.0000 mg | ORAL_TABLET | ORAL | Status: DC | PRN
  Administered 2015-09-24: 10 mg via ORAL
  Administered 2015-09-24: 5 mg via ORAL
  Administered 2015-09-25 – 2015-09-26 (×12): 10 mg via ORAL
  Filled 2015-09-24 (×4): qty 2
  Filled 2015-09-24: qty 1
  Filled 2015-09-24 (×9): qty 2

## 2015-09-24 MED ORDER — BUPIVACAINE-EPINEPHRINE (PF) 0.25% -1:200000 IJ SOLN
INTRAMUSCULAR | Status: AC
  Filled 2015-09-24: qty 30

## 2015-09-24 MED ORDER — POVIDONE-IODINE 10 % EX SWAB: 2.0000 "application " | Freq: Once | CUTANEOUS | Status: DC

## 2015-09-24 MED ORDER — CEFAZOLIN SODIUM-DEXTROSE 2-4 GM/100ML-% IV SOLN: 2.0000 g | INTRAVENOUS | Status: DC

## 2015-09-24 MED ORDER — PIOGLITAZONE HCL-METFORMIN HCL 15-500 MG PO TABS: 1.0000 | ORAL_TABLET | Freq: Every day | ORAL | Status: DC

## 2015-09-24 MED ORDER — DEXAMETHASONE SODIUM PHOSPHATE 10 MG/ML IJ SOLN
INTRAMUSCULAR | Status: AC
  Filled 2015-09-24: qty 1

## 2015-09-24 MED ORDER — FENTANYL CITRATE (PF) 250 MCG/5ML IJ SOLN
INTRAMUSCULAR | Status: AC
  Filled 2015-09-24: qty 5

## 2015-09-24 MED ORDER — MIDAZOLAM HCL 5 MG/5ML IJ SOLN
INTRAMUSCULAR | Status: DC | PRN
  Administered 2015-09-24: 2 mg via INTRAVENOUS

## 2015-09-24 MED ORDER — FINASTERIDE 5 MG PO TABS
5.0000 mg | ORAL_TABLET | Freq: Every day | ORAL | Status: DC
  Administered 2015-09-24 – 2015-09-25 (×2): 5 mg via ORAL
  Filled 2015-09-24 (×2): qty 1

## 2015-09-24 MED ORDER — LACTATED RINGERS IV SOLN
INTRAVENOUS | Status: DC
  Administered 2015-09-24: 14:00:00 via INTRAVENOUS

## 2015-09-24 MED ORDER — SUGAMMADEX SODIUM 200 MG/2ML IV SOLN
INTRAVENOUS | Status: DC | PRN
  Administered 2015-09-24: 150 mg via INTRAVENOUS

## 2015-09-24 MED ORDER — PROMETHAZINE HCL 25 MG/ML IJ SOLN: 6.2500 mg | INTRAMUSCULAR | Status: DC | PRN

## 2015-09-24 MED ORDER — ACETAMINOPHEN 500 MG PO TABS
1000.0000 mg | ORAL_TABLET | Freq: Four times a day (QID) | ORAL | Status: AC
  Administered 2015-09-24 – 2015-09-25 (×2): 1000 mg via ORAL
  Filled 2015-09-24 (×3): qty 2

## 2015-09-24 MED ORDER — SUGAMMADEX SODIUM 200 MG/2ML IV SOLN
INTRAVENOUS | Status: AC
  Filled 2015-09-24: qty 2

## 2015-09-24 MED ORDER — LACTATED RINGERS IV SOLN
INTRAVENOUS | Status: DC | PRN
  Administered 2015-09-24 (×2): via INTRAVENOUS

## 2015-09-24 MED ORDER — ROCURONIUM BROMIDE 100 MG/10ML IV SOLN
INTRAVENOUS | Status: DC | PRN
  Administered 2015-09-24: 30 mg via INTRAVENOUS
  Administered 2015-09-24: 5 mg via INTRAVENOUS

## 2015-09-24 MED ORDER — MORPHINE SULFATE (PF) 2 MG/ML IV SOLN
1.0000 mg | INTRAVENOUS | Status: DC | PRN
  Administered 2015-09-24 – 2015-09-25 (×4): 1 mg via INTRAVENOUS
  Filled 2015-09-24 (×4): qty 1

## 2015-09-24 MED ORDER — ONDANSETRON HCL 4 MG PO TABS: 4.0000 mg | ORAL_TABLET | Freq: Four times a day (QID) | ORAL | Status: DC | PRN

## 2015-09-24 MED ORDER — METHOCARBAMOL 500 MG PO TABS
500.0000 mg | ORAL_TABLET | Freq: Four times a day (QID) | ORAL | Status: DC | PRN
  Administered 2015-09-24 – 2015-09-26 (×7): 500 mg via ORAL
  Filled 2015-09-24 (×7): qty 1

## 2015-09-24 MED ORDER — LABETALOL HCL 5 MG/ML IV SOLN
INTRAVENOUS | Status: DC | PRN
  Administered 2015-09-24 (×2): 5 mg via INTRAVENOUS

## 2015-09-24 MED ORDER — ACETAMINOPHEN 10 MG/ML IV SOLN
INTRAVENOUS | Status: AC
  Filled 2015-09-24: qty 100

## 2015-09-24 MED ORDER — ONDANSETRON HCL 4 MG/2ML IJ SOLN: 4.0000 mg | Freq: Four times a day (QID) | INTRAMUSCULAR | Status: DC | PRN

## 2015-09-24 MED ORDER — METFORMIN HCL 500 MG PO TABS: 500.0000 mg | ORAL_TABLET | Freq: Every day | ORAL | Status: DC

## 2015-09-24 MED ORDER — PROPOFOL 10 MG/ML IV BOLUS
INTRAVENOUS | Status: AC
  Filled 2015-09-24: qty 20

## 2015-09-24 MED ORDER — KETAMINE HCL 10 MG/ML IJ SOLN
INTRAMUSCULAR | Status: AC
  Filled 2015-09-24: qty 1

## 2015-09-24 MED ORDER — CEFAZOLIN SODIUM-DEXTROSE 2-4 GM/100ML-% IV SOLN
2.0000 g | Freq: Four times a day (QID) | INTRAVENOUS | Status: AC
  Administered 2015-09-24 – 2015-09-25 (×3): 2 g via INTRAVENOUS
  Filled 2015-09-24 (×3): qty 100

## 2015-09-24 MED ORDER — BISACODYL 10 MG RE SUPP: 10.0000 mg | Freq: Every day | RECTAL | Status: DC | PRN

## 2015-09-24 MED ORDER — METOCLOPRAMIDE HCL 5 MG/ML IJ SOLN: 5.0000 mg | Freq: Three times a day (TID) | INTRAMUSCULAR | Status: DC | PRN

## 2015-09-24 MED ORDER — HYDROMORPHONE HCL 2 MG/ML IJ SOLN
INTRAMUSCULAR | Status: AC
  Filled 2015-09-24: qty 1

## 2015-09-24 MED ORDER — ATORVASTATIN CALCIUM 20 MG PO TABS
40.0000 mg | ORAL_TABLET | Freq: Every day | ORAL | Status: DC
  Administered 2015-09-25 – 2015-09-26 (×2): 40 mg via ORAL
  Filled 2015-09-24 (×2): qty 2

## 2015-09-24 MED ORDER — NALOXEGOL OXALATE 25 MG PO TABS
25.0000 mg | ORAL_TABLET | Freq: Every day | ORAL | Status: DC
  Administered 2015-09-25 – 2015-09-26 (×2): 25 mg via ORAL
  Filled 2015-09-24 (×2): qty 1

## 2015-09-24 MED ORDER — PROPOFOL 10 MG/ML IV BOLUS
INTRAVENOUS | Status: DC | PRN
  Administered 2015-09-24: 200 mg via INTRAVENOUS

## 2015-09-24 MED ORDER — ALBUTEROL SULFATE (2.5 MG/3ML) 0.083% IN NEBU: 2.5000 mg | INHALATION_SOLUTION | Freq: Four times a day (QID) | RESPIRATORY_TRACT | Status: DC | PRN

## 2015-09-24 MED ORDER — GLYCOPYRROLATE 0.2 MG/ML IJ SOLN
INTRAMUSCULAR | Status: DC | PRN
  Administered 2015-09-24: 0.2 mg via INTRAVENOUS

## 2015-09-24 MED ORDER — OXYCODONE HCL ER 10 MG PO T12A
10.0000 mg | EXTENDED_RELEASE_TABLET | Freq: Two times a day (BID) | ORAL | Status: DC
  Administered 2015-09-24 – 2015-09-26 (×4): 10 mg via ORAL
  Filled 2015-09-24 (×4): qty 1

## 2015-09-24 MED ORDER — ACETAMINOPHEN 325 MG PO TABS: 650.0000 mg | ORAL_TABLET | Freq: Four times a day (QID) | ORAL | Status: DC | PRN

## 2015-09-24 MED ORDER — GUAIFENESIN ER 600 MG PO TB12
600.0000 mg | ORAL_TABLET | Freq: Two times a day (BID) | ORAL | Status: DC
  Administered 2015-09-24 – 2015-09-26 (×4): 600 mg via ORAL
  Filled 2015-09-24 (×5): qty 1

## 2015-09-24 MED ORDER — ONDANSETRON HCL 4 MG/2ML IJ SOLN
INTRAMUSCULAR | Status: DC | PRN
  Administered 2015-09-24: 4 mg via INTRAVENOUS

## 2015-09-24 MED ORDER — ACETAMINOPHEN 10 MG/ML IV SOLN: 1000.0000 mg | Freq: Once | INTRAVENOUS | Status: DC

## 2015-09-24 MED ORDER — HYDROMORPHONE HCL 1 MG/ML IJ SOLN
INTRAMUSCULAR | Status: DC | PRN
  Administered 2015-09-24 (×2): 1 mg via INTRAVENOUS

## 2015-09-24 MED ORDER — LACTATED RINGERS IV SOLN: INTRAVENOUS | Status: DC

## 2015-09-24 MED ORDER — METHOCARBAMOL 1000 MG/10ML IJ SOLN
500.0000 mg | Freq: Four times a day (QID) | INTRAVENOUS | Status: DC | PRN
  Filled 2015-09-24: qty 5

## 2015-09-24 MED ORDER — KETAMINE HCL 10 MG/ML IJ SOLN
INTRAMUSCULAR | Status: DC | PRN
  Administered 2015-09-24: 20 mg via INTRAVENOUS

## 2015-09-24 MED ORDER — HYDROMORPHONE HCL 1 MG/ML IJ SOLN: 0.2500 mg | INTRAMUSCULAR | Status: DC | PRN

## 2015-09-24 MED ORDER — DEXAMETHASONE SODIUM PHOSPHATE 10 MG/ML IJ SOLN
INTRAMUSCULAR | Status: DC | PRN
  Administered 2015-09-24: 10 mg via INTRAVENOUS

## 2015-09-24 MED ORDER — ACETAMINOPHEN 10 MG/ML IV SOLN
INTRAVENOUS | Status: DC | PRN
  Administered 2015-09-24: 1000 mg via INTRAVENOUS

## 2015-09-24 MED ORDER — DOCUSATE SODIUM 100 MG PO CAPS
100.0000 mg | ORAL_CAPSULE | Freq: Two times a day (BID) | ORAL | Status: DC
  Administered 2015-09-24 – 2015-09-26 (×4): 100 mg via ORAL
  Filled 2015-09-24 (×4): qty 1

## 2015-09-24 MED ORDER — HYDROMORPHONE HCL 1 MG/ML IJ SOLN
0.2500 mg | INTRAMUSCULAR | Status: DC | PRN
  Administered 2015-09-24 (×6): 0.5 mg via INTRAVENOUS

## 2015-09-24 MED ORDER — TRAMADOL HCL 50 MG PO TABS
100.0000 mg | ORAL_TABLET | Freq: Four times a day (QID) | ORAL | Status: DC | PRN
  Administered 2015-09-24 – 2015-09-25 (×2): 100 mg via ORAL
  Filled 2015-09-24 (×2): qty 2

## 2015-09-24 MED ORDER — LABETALOL HCL 5 MG/ML IV SOLN
INTRAVENOUS | Status: AC
  Filled 2015-09-24: qty 4

## 2015-09-24 MED ORDER — HYDROCODONE-ACETAMINOPHEN 7.5-325 MG PO TABS
1.0000 | ORAL_TABLET | Freq: Once | ORAL | Status: AC | PRN
  Administered 2015-09-24: 1 via ORAL

## 2015-09-24 MED ORDER — MIDAZOLAM HCL 2 MG/2ML IJ SOLN
INTRAMUSCULAR | Status: AC
  Filled 2015-09-24: qty 2

## 2015-09-24 MED ORDER — FLEET ENEMA 7-19 GM/118ML RE ENEM: 1.0000 | ENEMA | Freq: Once | RECTAL | Status: DC | PRN

## 2015-09-24 MED ORDER — DEXAMETHASONE SODIUM PHOSPHATE 10 MG/ML IJ SOLN: 10.0000 mg | Freq: Once | INTRAMUSCULAR | Status: DC

## 2015-09-24 MED ORDER — LIDOCAINE HCL (CARDIAC) 20 MG/ML IV SOLN
INTRAVENOUS | Status: DC | PRN
  Administered 2015-09-24: 75 mg via INTRAVENOUS
  Administered 2015-09-24: 25 mg via INTRATRACHEAL

## 2015-09-24 MED ORDER — LIDOCAINE HCL (CARDIAC) 20 MG/ML IV SOLN
INTRAVENOUS | Status: AC
  Filled 2015-09-24: qty 5

## 2015-09-24 MED ORDER — CEFAZOLIN SODIUM-DEXTROSE 2-4 GM/100ML-% IV SOLN
INTRAVENOUS | Status: AC
Start: 2015-09-24 — End: 2015-09-24
  Filled 2015-09-24: qty 100

## 2015-09-24 MED ORDER — SODIUM CHLORIDE 0.9 % IV SOLN
INTRAVENOUS | Status: DC
  Administered 2015-09-24: 100 mL/h via INTRAVENOUS

## 2015-09-24 MED ORDER — HYDROCODONE-ACETAMINOPHEN 7.5-325 MG PO TABS
ORAL_TABLET | ORAL | Status: AC
  Filled 2015-09-24: qty 1

## 2015-09-24 MED ORDER — FENTANYL CITRATE (PF) 100 MCG/2ML IJ SOLN
INTRAMUSCULAR | Status: DC | PRN
  Administered 2015-09-24 (×4): 100 ug via INTRAVENOUS
  Administered 2015-09-24 (×2): 50 ug via INTRAVENOUS

## 2015-09-24 MED ORDER — SODIUM CHLORIDE 0.9 % IJ SOLN
INTRAMUSCULAR | Status: AC
  Filled 2015-09-24: qty 50

## 2015-09-24 MED ORDER — CHLORHEXIDINE GLUCONATE 4 % EX LIQD: 60.0000 mL | Freq: Once | CUTANEOUS | Status: DC

## 2015-09-24 MED ORDER — PIOGLITAZONE HCL 15 MG PO TABS
15.0000 mg | ORAL_TABLET | Freq: Every day | ORAL | Status: DC
  Administered 2015-09-25 – 2015-09-26 (×2): 15 mg via ORAL
  Filled 2015-09-24 (×2): qty 1

## 2015-09-24 MED ORDER — POLYETHYLENE GLYCOL 3350 17 G PO PACK: 17.0000 g | PACK | Freq: Every day | ORAL | Status: DC | PRN

## 2015-09-24 MED ORDER — ACETAMINOPHEN 650 MG RE SUPP: 650.0000 mg | Freq: Four times a day (QID) | RECTAL | Status: DC | PRN

## 2015-09-24 SURGICAL SUPPLY — 55 items
BAG ZIPLOCK 12X15 (MISCELLANEOUS) ×3 IMPLANT
BANDAGE ACE 6X5 VEL STRL LF (GAUZE/BANDAGES/DRESSINGS) ×3 IMPLANT
BANDAGE ESMARK 6X9 LF (GAUZE/BANDAGES/DRESSINGS) ×1 IMPLANT
BNDG ESMARK 6X9 LF (GAUZE/BANDAGES/DRESSINGS) ×3
CLOSURE WOUND 1/2 X4 (GAUZE/BANDAGES/DRESSINGS) ×1
CUFF TOURN SGL QUICK 34 (TOURNIQUET CUFF) ×2
CUFF TRNQT CYL 34X4X40X1 (TOURNIQUET CUFF) ×1 IMPLANT
DRAPE C-ARM 42X120 X-RAY (DRAPES) ×3 IMPLANT
DRAPE SHEET LG 3/4 BI-LAMINATE (DRAPES) ×3 IMPLANT
DRAPE U-SHAPE 47X51 STRL (DRAPES) ×3 IMPLANT
DRSG ADAPTIC 3X8 NADH LF (GAUZE/BANDAGES/DRESSINGS) ×3 IMPLANT
DRSG EMULSION OIL 3X16 NADH (GAUZE/BANDAGES/DRESSINGS) ×3 IMPLANT
DRSG PAD ABDOMINAL 8X10 ST (GAUZE/BANDAGES/DRESSINGS) ×3 IMPLANT
DURAPREP 26ML APPLICATOR (WOUND CARE) ×3 IMPLANT
ELECT REM PT RETURN 9FT ADLT (ELECTROSURGICAL) ×3
ELECTRODE REM PT RTRN 9FT ADLT (ELECTROSURGICAL) ×1 IMPLANT
GAUZE SPONGE 4X4 12PLY STRL (GAUZE/BANDAGES/DRESSINGS) ×3 IMPLANT
GLOVE BIO SURGEON STRL SZ7.5 (GLOVE) ×3 IMPLANT
GLOVE BIO SURGEON STRL SZ8 (GLOVE) ×3 IMPLANT
GLOVE BIOGEL PI IND STRL 8 (GLOVE) ×3 IMPLANT
GLOVE BIOGEL PI INDICATOR 8 (GLOVE) ×6
GOWN STRL REUS W/TWL LRG LVL3 (GOWN DISPOSABLE) ×3 IMPLANT
GOWN STRL REUS W/TWL XL LVL3 (GOWN DISPOSABLE) ×3 IMPLANT
IMMOBILIZER KNEE 20 (SOFTGOODS)
IMMOBILIZER KNEE 20 THIGH 36 (SOFTGOODS) IMPLANT
KIT BASIN OR (CUSTOM PROCEDURE TRAY) ×3 IMPLANT
MANIFOLD NEPTUNE II (INSTRUMENTS) ×3 IMPLANT
NS IRRIG 1000ML POUR BTL (IV SOLUTION) ×3 IMPLANT
PACK TOTAL JOINT (CUSTOM PROCEDURE TRAY) ×3 IMPLANT
PAD CAST 4YDX4 CTTN HI CHSV (CAST SUPPLIES) ×2 IMPLANT
PADDING CAST COTTON 4X4 STRL (CAST SUPPLIES) ×4
PADDING CAST COTTON 6X4 STRL (CAST SUPPLIES) ×3 IMPLANT
PIN THREADED GUIDE ACE (PIN) ×6 IMPLANT
POSITIONER SURGICAL ARM (MISCELLANEOUS) ×3 IMPLANT
SCREW CANN 6.5 75MM (Screw) ×3 IMPLANT
SCREW CANN 6.5 80MM (Screw) ×2 IMPLANT
SCREW CANN LG 6.5 FLT 75X22 (Screw) ×1 IMPLANT
SCREW CANN LG 6.5 FLT 80X22 (Screw) ×1 IMPLANT
SPONGE LAP 18X18 X RAY DECT (DISPOSABLE) ×6 IMPLANT
STOCKINETTE 8 INCH (MISCELLANEOUS) ×3 IMPLANT
STRIP CLOSURE SKIN 1/2X4 (GAUZE/BANDAGES/DRESSINGS) ×2 IMPLANT
SUCTION FRAZIER HANDLE 10FR (MISCELLANEOUS) ×2
SUCTION TUBE FRAZIER 10FR DISP (MISCELLANEOUS) ×1 IMPLANT
SUT ETHILON 4 0 PS 2 18 (SUTURE) ×3 IMPLANT
SUT MNCRL AB 4-0 PS2 18 (SUTURE) ×3 IMPLANT
SUT PDS AB 1 CT1 27 (SUTURE) ×3 IMPLANT
SUT VIC AB 1 CT1 27 (SUTURE) ×10
SUT VIC AB 1 CT1 27XBRD ANTBC (SUTURE) ×5 IMPLANT
SUT VIC AB 2-0 CT1 27 (SUTURE) ×3
SUT VIC AB 2-0 CT1 TAPERPNT 27 (SUTURE) ×1 IMPLANT
SUT VIC AB 2-0 CT2 27 (SUTURE) IMPLANT
SUT VLOC 180 0 24IN GS25 (SUTURE) ×3 IMPLANT
TOWEL OR 17X26 10 PK STRL BLUE (TOWEL DISPOSABLE) ×6 IMPLANT
WASHER ACECAN 6.5 (Washer) ×6 IMPLANT
WATER STERILE IRR 1500ML POUR (IV SOLUTION) ×3 IMPLANT

## 2015-09-24 NOTE — Transfer of Care (Signed)
Immediate Anesthesia Transfer of Care Note  Patient: [name]  Procedure(s) Performed: Procedure(s): OPEN REDUCTION INTERNAL FIXATION (ORIF) LEFT TIBIAL PLATEAU (Left)  Patient Location: PACU  Anesthesia Type:General  Level of Consciousness: awake, alert , oriented and patient cooperative  Airway & Oxygen Therapy: Patient Spontanous Breathing and Patient connected to face mask oxygen  Post-op Assessment: Report given to RN, Post -op Vital signs reviewed and stable and Patient moving all extremities X 4  Post vital signs: stable  Last Vitals:  Vitals:   09/24/15 1256 09/24/15 1645  BP: 124/74   Pulse: 73   Resp: 18 10  Temp: 36.7 C     Last Pain:  Vitals:   09/24/15 1256  TempSrc: Oral  PainSc:       Patients Stated Pain Goal: 4 (09/24/15 1253)  Complications: No apparent anesthesia complications

## 2015-09-24 NOTE — Interval H&P Note (Signed)
History and Physical Interval Note:  09/24/2015 2:47 PM  [name] MelenaGary P Goodchild  has presented today for surgery, with the diagnosis of periprosthestic fracture left tibial plateau  The various methods of treatment have been discussed with the patient and family. After consideration of risks, benefits and other options for treatment, the patient has consented to  Procedure(s): OPEN REDUCTION INTERNAL FIXATION (ORIF) LEFT TIBIAL PLATEAU (Left) as a surgical intervention .  The patient's history has been reviewed, patient examined, no change in status, stable for surgery.  I have reviewed the patient's chart and labs.  Questions were answered to the patient's satisfaction.     Loanne DrillingALUISIO,Adonis Ryther V

## 2015-09-24 NOTE — Anesthesia Procedure Notes (Signed)
Performed by: Amrutha Avera J       

## 2015-09-24 NOTE — Brief Op Note (Signed)
09/24/2015  4:08 PM  PATIENT:  Ronn MelenaGary P Dumas  75 y.o. male  PRE-OPERATIVE DIAGNOSIS:  periprosthestic fracture left tibial plateau  POST-OPERATIVE DIAGNOSIS:  periprosthestic fracture left tibial plateau  PROCEDURE:  Procedure(s): OPEN REDUCTION INTERNAL FIXATION (ORIF) LEFT TIBIAL PLATEAU (Left)  SURGEON:  Surgeon(s) and Role:    * Ollen GrossFrank Shannara Winbush, MD - Primary  PHYSICIAN ASSISTANT:   ASSISTANTS: Avel Peacerew Perkins, PA-C   ANESTHESIA:   general  EBL:  Total I/O In: 1000 [I.V.:1000] Out: -   BLOOD ADMINISTERED:none  DRAINS: none   LOCAL MEDICATIONS USED:  NONE  COUNTS:  YES  TOURNIQUET:   Total Tourniquet Time Documented: Thigh (Left) - 37 minutes Total: Thigh (Left) - 37 minutes   DICTATION: .Other Dictation: Dictation Number [id number]  PLAN OF CARE: Admit to inpatient   PATIENT DISPOSITION:  PACU - hemodynamically stable.

## 2015-09-24 NOTE — Progress Notes (Signed)
Offered to place pt on nocturnal cpap, but he refused.  Pt stated he will be fine for tonight.  Pt was advised that RT is available all night and encouraged him to call, should he change his mind.  RN aware.

## 2015-09-24 NOTE — Anesthesia Postprocedure Evaluation (Signed)
Anesthesia Post Note  Patient: [name] MelenaGary P Tretter  Procedure(s) Performed: Procedure(s) (LRB): OPEN REDUCTION INTERNAL FIXATION (ORIF) LEFT TIBIAL PLATEAU (Left)  Patient location during evaluation: PACU Anesthesia Type: General Level of consciousness: awake and alert and patient cooperative Pain management: pain level controlled Vital Signs Assessment: post-procedure vital signs reviewed and stable Respiratory status: spontaneous breathing and respiratory function stable Cardiovascular status: stable Anesthetic complications: no    Last Vitals:  Vitals:   09/24/15 1800 09/24/15 1815  BP: 137/82 (!) 145/75  Pulse: 90 85  Resp: 18 16  Temp:  36.8 C    Last Pain:  Vitals:   09/24/15 1815  TempSrc: Oral  PainSc: 5                  Hanford Lust S

## 2015-09-24 NOTE — Anesthesia Preprocedure Evaluation (Signed)
Anesthesia Evaluation  Patient identified by MRN, date of birth, ID band Patient awake    Reviewed: Allergy & Precautions, NPO status , Patient's Chart, lab work & pertinent test results  Airway Mallampati: II  TM Distance: >3 FB Neck ROM: Full    Dental   Pulmonary sleep apnea , former smoker,    breath sounds clear to auscultation       Cardiovascular negative cardio ROS   Rhythm:Regular Rate:Normal     Neuro/Psych    GI/Hepatic Neg liver ROS, GERD  ,  Endo/Other  diabetes  Renal/GU negative Renal ROS     Musculoskeletal   Abdominal   Peds  Hematology   Anesthesia Other Findings   Reproductive/Obstetrics                             Anesthesia Physical  Anesthesia Plan  ASA: III  Anesthesia Plan: General   Post-op Pain Management:    Induction: Intravenous  Airway Management Planned: Oral ETT  Additional Equipment:   Intra-op Plan:   Post-operative Plan: Extubation in OR  Informed Consent: I have reviewed the patients History and Physical, chart, labs and discussed the procedure including the risks, benefits and alternatives for the proposed anesthesia with the patient or authorized representative who has indicated his/her understanding and acceptance.   Dental advisory given  Plan Discussed with: CRNA  Anesthesia Plan Comments:         Anesthesia Quick Evaluation
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
## 2015-09-24 NOTE — Anesthesia Procedure Notes (Addendum)
Procedure Name: Intubation Date/Time: 09/24/2015 3:00 PM Performed by: Illene SilverEVANS, Lilyahna Sirmon E Pre-anesthesia Checklist: Patient identified, Emergency Drugs available, Suction available and Patient being monitored Patient Re-evaluated:Patient Re-evaluated prior to inductionOxygen Delivery Method: Circle system utilized Preoxygenation: Pre-oxygenation with 100% oxygen Intubation Type: IV induction Ventilation: Mask ventilation without difficulty Laryngoscope Size: Mac, 4 and Glidescope Grade View: Grade III Tube type: Oral Tube size: 8.0 mm Number of attempts: 1 Airway Equipment and Method: Stylet and Oral airway Placement Confirmation: ETT inserted through vocal cords under direct vision,  positive ETCO2 and breath sounds checked- equal and bilateral Secured at: 22 cm Tube secured with: Tape Dental Injury: Teeth and Oropharynx as per pre-operative assessment  Comments: History of previous  glidescope use. Elective glidescope , floppy  Epiglottis with repositioning of glidescope blade to improve visualization

## 2015-09-25 ENCOUNTER — Encounter (HOSPITAL_COMMUNITY): Payer: Self-pay | Admitting: Orthopedic Surgery

## 2015-09-25 LAB — GLUCOSE, CAPILLARY
Glucose-Capillary: 150 mg/dL — ABNORMAL HIGH (ref 65–99)
Glucose-Capillary: 128 mg/dL — ABNORMAL HIGH (ref 65–99)
Glucose-Capillary: 135 mg/dL — ABNORMAL HIGH (ref 65–99)
Glucose-Capillary: 140 mg/dL — ABNORMAL HIGH (ref 65–99)

## 2015-09-25 NOTE — Progress Notes (Signed)
   Subjective: 1 Day Post-Op Procedure(s) (LRB): OPEN REDUCTION INTERNAL FIXATION (ORIF) LEFT TIBIAL PLATEAU (Left) Patient reports pain as mild and moderate.   Patient seen in rounds by Dr. Lequita HaltAluisio.  Doing better this morning. Patient is well, but has had some minor complaints of pain in the knee, requiring pain medications We will start therapy today.  Plan is to go Home after hospital stay.  Objective: Vital signs in last 24 hours: Temp:  [97.6 F (36.4 C)-98.3 F (36.8 C)] 98.3 F (36.8 C) (08/17 0713) Pulse Rate:  [65-90] 65 (08/17 0713) Resp:  [10-18] 16 (08/17 0713) BP: (115-168)/(48-88) 121/63 (08/17 0713) SpO2:  [94 %-100 %] 98 % (08/17 0713) Weight:  [94.8 kg (209 lb)] 94.8 kg (209 lb) (08/16 1815)  Intake/Output from previous day: 08/16 0701 - 08/17 0700 In: 2981.7 [P.O.:480; I.V.:2301.7; IV Piggyback:200] Out: 2625 [Urine:2575; Blood:50] Intake/Output this shift: Total I/O In: 360 [P.O.:360] Out: 350 [Urine:350]   Recent Labs  09/24/15 1824  HGB 11.6*    Recent Labs  09/24/15 1824  WBC 11.3*  RBC 3.59*  HCT 35.1*  PLT 232    Recent Labs  09/24/15 1824  CREATININE 0.80   No results for input(s): LABPT, INR in the last 72 hours.  EXAM General - Patient is Alert, Appropriate and Oriented Extremity - Neurovascular intact Sensation intact distally Dressing - dressing C/D/I Motor Function - intact, moving foot and toes well on exam.   Past Medical History:  Diagnosis Date  . Anemia   . Arthritis    "hands; probably in my back too" (08/28/2015)  . Barrett's esophagus with dysplasia   . Chronic lower back pain    DDD  . Closed patellar sleeve fracture of left knee   . Constipation   . Crush injury knee    bilateral  . Dysphagia    EGD with dilitation  . Enlarged prostate    sees a urologist  . GERD (gastroesophageal reflux disease)    takes Omeprazole daily  . Hard of hearing   . Hyperlipidemia    takes Lipitor and Fish Oil daily  .  Insomnia    d/t pain  . Joint pain   . Joint swelling   . Left tibial fracture   . Multiple duodenal ulcers   . Nocturia   . OSA on CPAP   . Pneumonia   . Type II diabetes mellitus (HCC)   . Unsteady gait   . Urinary frequency   . Wears glasses     Assessment/Plan: 1 Day Post-Op Procedure(s) (LRB): OPEN REDUCTION INTERNAL FIXATION (ORIF) LEFT TIBIAL PLATEAU (Left) Principal Problem:   Periprosthetic fracture of proximal end of tibia  Estimated body mass index is 32.73 kg/m as calculated from the following:   Height as of this encounter: 5\' 7"  (1.702 m).   Weight as of this encounter: 94.8 kg (209 lb). Advance diet Up with therapy Discharge home with home health probably tomorrow  DVT Prophylaxis - Lovenox Touch Down Weight Bearing Only to the left leg D/C O2 and Pulse OX and try on Room Air  Avel Peacerew Genowefa Morga, PA-C Orthopaedic Surgery 09/25/2015, 9:30 AM
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
## 2015-09-25 NOTE — Care Management Note (Signed)
Case Management Note  Patient Details  Name: [name] MRN: [id number] Date of Birth: [birth year]  Subjective/Objective:                  OPEN REDUCTION INTERNAL FIXATION (ORIF) LEFT TIBIAL PLATEAU (Left) Action/Plan: Discharge planning Expected Discharge Date:                  Expected Discharge Plan:  Home/Self Care  In-House Referral:     Discharge planning Services  CM Consult  Post Acute Care Choice:    Choice offered to:     DME Arranged:  N/A DME Agency:  NA  HH Arranged:  NA HH Agency:  NA  Status of Service:  Completed, signed off  If discussed at [place] of Stay Meetings, dates discussed:    Additional Comments: Cm met with pt in room to confirm plan is for outpt PT; pt confirms.  Pt states he has all DMe from previous surgeries at home.  NO other CM needs were communicated. Dellie Catholic, RN 09/25/2015, 11:59 AM
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
## 2015-09-25 NOTE — Progress Notes (Signed)
Pt refused Nocturnal CPAP.  Education provided.  RT will continue to monitor as needed.
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
## 2015-09-26 LAB — GLUCOSE, CAPILLARY: Glucose-Capillary: 114 mg/dL — ABNORMAL HIGH (ref 65–99)

## 2015-09-26 MED ORDER — ASPIRIN EC 325 MG PO TBEC: 325.0000 mg | DELAYED_RELEASE_TABLET | Freq: Every day | ORAL | 0 refills | Status: DC

## 2015-09-26 MED ORDER — OXYCODONE HCL 5 MG PO TABS: 5.0000 mg | ORAL_TABLET | ORAL | 0 refills | Status: DC | PRN

## 2015-09-26 MED ORDER — TRAMADOL HCL 50 MG PO TABS: 100.0000 mg | ORAL_TABLET | Freq: Four times a day (QID) | ORAL | 1 refills | Status: DC | PRN

## 2015-09-26 MED ORDER — METHOCARBAMOL 500 MG PO TABS: 500.0000 mg | ORAL_TABLET | Freq: Four times a day (QID) | ORAL | 0 refills | Status: DC | PRN

## 2015-09-26 NOTE — Progress Notes (Addendum)
Subjective: 2 Days Post-Op Procedure(s) (LRB): OPEN REDUCTION INTERNAL FIXATION (ORIF) LEFT TIBIAL PLATEAU (Left) Patient reports pain as mild.   Patient seen in rounds with Dr. Lequita HaltAluisio. Patient is well, but has had some minor complaints of pain in the knee, requiring pain medications Patient is ready to go home today.  Objective: Vital signs in last 24 hours: Temp:  [98.1 F (36.7 C)-98.9 F (37.2 C)] 98.9 F (37.2 C) (08/18 0555) Pulse Rate:  [69-77] 75 (08/18 0555) Resp:  [16] 16 (08/18 0555) BP: (112-117)/(56-65) 117/65 (08/18 0555) SpO2:  [96 %-99 %] 97 % (08/18 0555)  Intake/Output from previous day:  Intake/Output Summary (Last 24 hours) at 09/26/15 0849 Last data filed at 09/26/15 0600  Gross per 24 hour  Intake             1200 ml  Output             2125 ml  Net             -925 ml    Intake/Output this shift: No intake/output data recorded.  Labs:  Recent Labs  09/24/15 1824  HGB 11.6*    Recent Labs  09/24/15 1824  WBC 11.3*  RBC 3.59*  HCT 35.1*  PLT 232    Recent Labs  09/24/15 1824  CREATININE 0.80   No results for input(s): LABPT, INR in the last 72 hours.  EXAM: General - Patient is Alert, Appropriate and Oriented Extremity - Neurovascular intact Sensation intact distally Dorsiflexion/Plantar flexion intact Incision - clean, dry, no drainage Motor Function - intact, moving foot and toes well on exam.   Assessment/Plan: 2 Days Post-Op Procedure(s) (LRB): OPEN REDUCTION INTERNAL FIXATION (ORIF) LEFT TIBIAL PLATEAU (Left) Procedure(s) (LRB): OPEN REDUCTION INTERNAL FIXATION (ORIF) LEFT TIBIAL PLATEAU (Left) Past Medical History:  Diagnosis Date  . Anemia   . Arthritis    "hands; probably in my back too" (08/28/2015)  . Barrett's esophagus with dysplasia   . Chronic lower back pain    DDD  . Closed patellar sleeve fracture of left knee   . Constipation   . Crush injury knee    bilateral  . Dysphagia    EGD with  dilitation  . Enlarged prostate    sees a urologist  . GERD (gastroesophageal reflux disease)    takes Omeprazole daily  . Hard of hearing   . Hyperlipidemia    takes Lipitor and Fish Oil daily  . Insomnia    d/t pain  . Joint pain   . Joint swelling   . Left tibial fracture   . Multiple duodenal ulcers   . Nocturia   . OSA on CPAP   . Pneumonia   . Type II diabetes mellitus (HCC)   . Unsteady gait   . Urinary frequency   . Wears glasses    Principal Problem:   Periprosthetic fracture of proximal end of tibia  Estimated body mass index is 32.73 kg/m as calculated from the following:   Height as of this encounter: 5\' 7"  (1.702 m).   Weight as of this encounter: 94.8 kg (209 lb). Up with therapy Discharge home with home health  ROM - patient may do gentle active ROM to the left knee, may also do gentle passive ROM with therapy. Knee Immobilizer when up but ay have off when in bad or the chair. Diet - Cardiac diet and Diabetic diet Follow up - in 2 weeks Activity - Touch Down Weight Bearing left leg Disposition -  Home Condition Upon Discharge - Good D/C Meds - See DC Summary DVT Prophylaxis - Aspirin 325 mg daily for three weeks and then reduce to a baby Aspirin 81 mg daily for three additional weeks.  Avel Peacerew Perkins, PA-C Orthopaedic Surgery 09/26/2015, 8:49 AM

## 2015-09-26 NOTE — Progress Notes (Signed)
This CM was contacted as pt was discharging to inform me that pt now needed HHPT. Per nursing pt has used [facility] before and would like to use them again. Order received and [facility] rep alerted of referral. No other CM needs communicated. Sandford Crazeora Harrington Jobe RN,BSN,NCM [phone]

## 2015-09-26 NOTE — Discharge Instructions (Signed)
ROM - patient may do gentle active ROM to the left knee, may also do gentle passive ROM with therapy. Knee Immobilizer when up but may have off when in bed or the chair. Diet - Cardiac diet and Diabetic diet Follow up - in 2 weeks Activity - Touch Down Weight Bearing to the left leg Disposition - Home DVT Prophylaxis - Aspirin 325 mg daily for three weeks and then reduce to a baby Aspirin 81 mg daily for three additional weeks.  Pick up stool softner and laxative for home use following surgery while on pain medications. Do not submerge incision under water. May change the surgical dressing tomorrow, Saturday 09/27/2015, and then apply a dry gauze dressing daily. Please use good hand washing techniques while changing dressing each day. May shower starting three days after surgery starting Saturday 09/27/2015. Please use a clean towel to pat the incision dry following showers. Continue to use ice for pain and swelling after surgery. Do not use any lotions or creams on the incision until instructed by your surgeon.  Postoperative Constipation Protocol  Constipation - defined medically as fewer than three stools per week and severe constipation as less than one stool per week.  One of the most common issues patients have following surgery is constipation. Even if you have a regular bowel pattern at home, your normal regimen is likely to be disrupted due to multiple reasons following surgery. Combination of anesthesia, postoperative narcotics, change in appetite and fluid intake all can affect your bowels. In order to avoid complications following surgery, here are some recommendations in order to help you during your recovery period.  Colace (docusate) - Pick up an over-the-counter form of Colace or another stool softener and take twice a day as long as you are requiring postoperative pain medications. Take with a full glass of water daily. If you experience loose stools or diarrhea, hold the  colace until you stool forms back up. If your symptoms do not get better within 1 week or if they get worse, check with your doctor.  Dulcolax (bisacodyl) - Pick up over-the-counter and take as directed by the product packaging as needed to assist with the movement of your bowels. Take with a full glass of water. Use this product as needed if not relieved by Colace only.   MiraLax (polyethylene glycol) - Pick up over-the-counter to have on hand. MiraLax is a solution that will increase the amount of water in your bowels to assist with bowel movements. Take as directed and can mix with a glass of water, juice, soda, coffee, or tea. Take if you go more than two days without a movement. Do not use MiraLax more than once per day. Call your doctor if you are still constipated or irregular after using this medication for 7 days in a row.  If you continue to have problems with postoperative constipation, please contact the office for further assistance and recommendations. If you experience "the worst abdominal pain ever" or develop nausea or vomiting, please contact the office immediatly for further recommendations for treatment.
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
## 2015-09-28 LAB — TYPE AND SCREEN
ABO/RH(D): A POS
Antibody Screen: POSITIVE
DAT, IgG: NEGATIVE
Unit division: 0
Unit division: 0

## 2015-10-10 ENCOUNTER — Telehealth: Payer: Self-pay | Admitting: Neurology

## 2015-10-10 NOTE — Telephone Encounter (Signed)
Pt called requesting new cpap f/u appt. He states he needs to be seen before Sept 9th or so. Can he be worked in? Please call [phone]

## 2015-10-10 NOTE — Telephone Encounter (Signed)
Needs to see NP, I am out of the office. CD

## 2015-10-14 NOTE — Telephone Encounter (Signed)
I can see the patient if I have any openings.

## 2015-10-14 NOTE — Telephone Encounter (Signed)
I have spoken witih [name] this morning and given an appt. with MM tomorrow at 0900/fim

## 2015-10-15 ENCOUNTER — Encounter: Payer: Self-pay | Admitting: Adult Health

## 2015-10-15 ENCOUNTER — Ambulatory Visit (INDEPENDENT_AMBULATORY_CARE_PROVIDER_SITE_OTHER): Payer: Medicare Other | Admitting: Adult Health

## 2015-10-15 VITALS — BP 118/73 | HR 75 | Resp 16

## 2015-10-15 DIAGNOSIS — G4733 Obstructive sleep apnea (adult) (pediatric): Secondary | ICD-10-CM

## 2015-10-15 DIAGNOSIS — Z9989 Dependence on other enabling machines and devices: Principal | ICD-10-CM
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
## 2015-10-15 NOTE — Patient Instructions (Signed)
Continue using the CPAP nightly. If your symptoms worsen or you develop new symptoms please let us know.   

## 2015-12-25 ENCOUNTER — Other Ambulatory Visit: Payer: Self-pay | Admitting: Adult Health

## 2016-02-19 NOTE — Progress Notes (Signed)
Please place orders in epic-  Has PST appt 02/24/15  thanks

## 2016-02-20 ENCOUNTER — Other Ambulatory Visit (HOSPITAL_COMMUNITY): Payer: Self-pay | Admitting: *Deleted

## 2016-02-20 ENCOUNTER — Ambulatory Visit: Payer: Self-pay | Admitting: Orthopedic Surgery

## 2016-02-20 NOTE — Progress Notes (Signed)
EKG 09-18-15 EPIC CHEST XRAY 1 VIEW XRAY 08-31-15 EPIC
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
## 2016-02-20 NOTE — Patient Instructions (Addendum)
[name]  02/20/2016   Your procedure is scheduled on: 03-03-16  Report to [facility] Main  Entrance take [facility]  elevators to 3rd floor to  [place] at 655 AM.  Call this number if you have problems the morning of surgery [phone]   Remember: ONLY 1 PERSON MAY GO WITH YOU TO SHORT STAY TO GET  READY MORNING OF [place].  Do not eat food or drink liquids :After Midnight.   CPAP: only bring you mask and tubing. The device will be provided for you.     Take these medicines the morning of surgery with A SIP OF WATER: atorvastatin (lipitor), omeprazole (prilosec), oxycodone if needed,  DO NOT TAKE ANY DIABETIC MEDICATIONS DAY OF YOUR SURGERY                               You may not have any metal on your body including hair pins and              piercings  Do not wear jewelry, make-up, lotions, powders or perfumes, deodorant             Do not wear nail polish.  Do not shave  48 hours prior to surgery.              Men may shave face and neck.   Do not bring valuables to the hospital. [place].  Contacts, dentures or bridgework may not be worn into surgery.  Leave suitcase in the car. After surgery it may be brought to your room.                 Please read over the following fact sheets you were given: _____________________________________________________________________             How to Manage Your Diabetes Before and After Surgery  Why is it important to control my blood sugar before and after surgery? . Improving blood sugar levels before and after surgery helps healing and can limit problems. . A way of improving blood sugar control is eating a healthy diet by: o  Eating less sugar and carbohydrates o  Increasing activity/exercise o  Talking with your doctor about reaching your blood sugar goals . High blood sugars (greater than 180 mg/dL) can raise your risk of  infections and slow your recovery, so you will need to focus on controlling your diabetes during the weeks before surgery. . Make sure that the doctor who takes care of your diabetes knows about your planned surgery including the date and location.  How do I manage my blood sugar before surgery? . Check your blood sugar at least 4 times a day, starting 2 days before surgery, to make sure that the level is not too high or low. o Check your blood sugar the morning of your surgery when you wake up and every 2 hours until you get to the Short Stay unit. . If your blood sugar is less than 70 mg/dL, you will need to treat for low blood sugar: o Do not take insulin. o Treat a low blood sugar (less than 70 mg/dL) with  cup of clear juice (cranberry or  apple), 4 glucose tablets, OR glucose gel. o Recheck blood sugar in 15 minutes after treatment (to make sure it is greater than 70 mg/dL). If your blood sugar is not greater than 70 mg/dL on recheck, call [phone] for further instructions. . Report your blood sugar to the short stay nurse when you get to Short Stay.  . If you are admitted to the hospital after surgery: o Your blood sugar will be checked by the staff and you will probably be given insulin after surgery (instead of oral diabetes medicines) to make sure you have good blood sugar levels. o The goal for blood sugar control after surgery is 80-180 mg/dL.   WHAT DO I DO ABOUT MY DIABETES MEDICATION?  YOU MAY TAKE YOUR PIOGOTAZONE-METFORMIN (ACTOPLUS MET DAY BEFORE SURGERY 03-02-16 . Do not take ACTOPLUS MET AM OF SURGERY 03-03-16 oral diabetes medicines (pills) the morning of surgery.  .      Patient Signature:  Date:   Nurse Signature:  Date:   Reviewed and Endorsed by [facility] Patient Education Committee, August 2015[facility] - Preparing for Surgery Before surgery, you can play an important role.  Because skin is not sterile, your skin needs to be as free of germs as possible.   You can reduce the number of germs on your skin by washing with CHG (chlorahexidine gluconate) soap before surgery.  CHG is an antiseptic cleaner which kills germs and bonds with the skin to continue killing germs even after washing. Please DO NOT use if you have an allergy to CHG or antibacterial soaps.  If your skin becomes reddened/irritated stop using the CHG and inform your nurse when you arrive at Short Stay. Do not shave (including legs and underarms) for at least 48 hours prior to the first CHG shower.  You may shave your face/neck. Please follow these instructions carefully:  1.  Shower with CHG Soap the night before surgery and the  morning of Surgery.  2.  If you choose to wash your hair, wash your hair first as usual with your  normal  shampoo.  3.  After you shampoo, rinse your hair and body thoroughly to remove the  shampoo.                           4.  Use CHG as you would any other liquid soap.  You can apply chg directly  to the skin and wash                       Gently with a scrungie or clean washcloth.  5.  Apply the CHG Soap to your body ONLY FROM THE NECK DOWN.   Do not use on face/ open                           Wound or open sores. Avoid contact with eyes, ears mouth and genitals (private parts).                       Wash face,  Genitals (private parts) with your normal soap.             6.  Wash thoroughly, paying special attention to the area where your surgery  will be performed.  7.  Thoroughly rinse your body with warm water from the neck down.  8.  DO NOT shower/wash with your normal  soap after using and rinsing off  the CHG Soap.                9.  Pat yourself dry with a clean towel.            10.  Wear clean pajamas.            11.  Place clean sheets on your bed the night of your first shower and do not  sleep with pets. Day of Surgery : Do not apply any lotions/deodorants the morning of surgery.  Please wear clean clothes to the hospital/surgery  center.    Incentive Spirometer  An incentive spirometer is a tool that can help keep your lungs clear and active. This tool measures how well you are filling your lungs with each breath. Taking long deep breaths may help reverse or decrease the chance of developing breathing (pulmonary) problems (especially infection) following:  A long period of time when you are unable to move or be active. BEFORE THE PROCEDURE   If the spirometer includes an indicator to show your best effort, your nurse or respiratory therapist will set it to a desired goal.  If possible, sit up straight or lean slightly forward. Try not to slouch.  Hold the incentive spirometer in an upright position. INSTRUCTIONS FOR USE  1. Sit on the edge of your bed if possible, or sit up as far as you can in bed or on a chair. 2. Hold the incentive spirometer in an upright position. 3. Breathe out normally. 4. Place the mouthpiece in your mouth and seal your lips tightly around it. 5. Breathe in slowly and as deeply as possible, raising the piston or the ball toward the top of the column. 6. Hold your breath for 3-5 seconds or for as long as possible. Allow the piston or ball to fall to the bottom of the column. 7. Remove the mouthpiece from your mouth and breathe out normally. 8. Rest for a few seconds and repeat Steps 1 through 7 at least 10 times every 1-2 hours when you are awake. Take your time and take a few normal breaths between deep breaths. 9. The spirometer may include an indicator to show your best effort. Use the indicator as a goal to work toward during each repetition. 10. After each set of 10 deep breaths, practice coughing to be sure your lungs are clear. If you have an incision (the cut made at the time of surgery), support your incision when coughing by placing a pillow or rolled up towels firmly against it. Once you are able to get out of bed, walk around indoors and cough well. You may stop using the  incentive spirometer when instructed by your caregiver.  RISKS AND COMPLICATIONS  Take your time so you do not get dizzy or light-headed.  If you are in pain, you may need to take or ask for pain medication before doing incentive spirometry. It is harder to take a deep breath if you are having pain. AFTER USE  Rest and breathe slowly and easily.  It can be helpful to keep track of a log of your progress. Your caregiver can provide you with a simple table to help with this. If you are using the spirometer at home, follow these instructions: [place] IF:   You are having difficultly using the spirometer.  You have trouble using the spirometer as often as instructed.  Your pain medication is not giving enough relief while  using the spirometer.  You develop fever of 100.5 F (38.1 C) or higher. SEEK IMMEDIATE MEDICAL CARE IF:   You cough up bloody sputum that had not been present before.  You develop fever of 102 F (38.9 C) or greater.  You develop worsening pain at or near the incision site. MAKE SURE YOU:   Understand these instructions.  Will watch your condition.  Will get help right away if you are not doing well or get worse. Document Released: 06/07/2006 Document Revised: 04/19/2011 Document Reviewed: 08/08/2006 ExitCare Patient Information 2014 ExitCare, [place].   ________________________________________________________________________  WHAT IS A BLOOD TRANSFUSION? Blood Transfusion Information  A transfusion is the replacement of blood or some of its parts. Blood is made up of multiple cells which provide different functions.  Red blood cells carry oxygen and are used for blood loss replacement.  White blood cells fight against infection.  Platelets control bleeding.  Plasma helps clot blood.  Other blood products are available for specialized needs, such as hemophilia or other clotting disorders. BEFORE THE TRANSFUSION  Who gives blood for  transfusions?   Healthy volunteers who are fully evaluated to make sure their blood is safe. This is blood bank blood. Transfusion therapy is the safest it has ever been in the practice of medicine. Before blood is taken from a donor, a complete history is taken to make sure that person has no history of diseases nor engages in risky social behavior (examples are intravenous drug use or sexual activity with multiple partners). The donor's travel history is screened to minimize risk of transmitting infections, such as malaria. The donated blood is tested for signs of infectious diseases, such as HIV and hepatitis. The blood is then tested to be sure it is compatible with you in order to minimize the chance of a transfusion reaction. If you or a relative donates blood, this is often done in anticipation of surgery and is not appropriate for emergency situations. It takes many days to process the donated blood. RISKS AND COMPLICATIONS Although transfusion therapy is very safe and saves many lives, the main dangers of transfusion include:   Getting an infectious disease.  Developing a transfusion reaction. This is an allergic reaction to something in the blood you were given. Every precaution is taken to prevent this. The decision to have a blood transfusion has been considered carefully by your caregiver before blood is given. Blood is not given unless the benefits outweigh the risks. AFTER THE TRANSFUSION  Right after receiving a blood transfusion, you will usually feel much better and more energetic. This is especially true if your red blood cells have gotten low (anemic). The transfusion raises the level of the red blood cells which carry oxygen, and this usually causes an energy increase.  The nurse administering the transfusion will monitor you carefully for complications. HOME CARE INSTRUCTIONS  No special instructions are needed after a transfusion. You may find your energy is better. Speak with  your caregiver about any limitations on activity for underlying diseases you may have. SEEK MEDICAL CARE IF:   Your condition is not improving after your transfusion.  You develop redness or irritation at the intravenous (IV) site. SEEK IMMEDIATE MEDICAL CARE IF:  Any of the following symptoms occur over the next 12 hours:  Shaking chills.  You have a temperature by mouth above 102 F (38.9 C), not controlled by medicine.  Chest, back, or muscle pain.  People around you feel you are not acting correctly or  are confused.  Shortness of breath or difficulty breathing.  Dizziness and fainting.  You get a rash or develop hives.  You have a decrease in urine output.  Your urine turns a dark color or changes to pink, red, or brown. Any of the following symptoms occur over the next 10 days:  You have a temperature by mouth above 102 F (38.9 C), not controlled by medicine.  Shortness of breath.  Weakness after normal activity.  The white part of the eye turns yellow (jaundice).  You have a decrease in the amount of urine or are urinating less often.  Your urine turns a dark color or changes to pink, red, or brown. Document Released: 01/23/2000 Document Revised: 04/19/2011 Document Reviewed: 09/11/2007 [facility] Patient Information 2014 [place], [place].  _______________________________________________________________________

## 2016-02-24 ENCOUNTER — Ambulatory Visit: Payer: Self-pay | Admitting: Orthopedic Surgery

## 2016-02-24 ENCOUNTER — Encounter (HOSPITAL_COMMUNITY): Payer: Self-pay

## 2016-02-24 ENCOUNTER — Encounter (HOSPITAL_COMMUNITY)
Admission: RE | Admit: 2016-02-24 | Discharge: 2016-02-24 | Disposition: A | Discharge status: Home / Self Care | Payer: Medicare Other | Source: Ambulatory Visit | Attending: Orthopedic Surgery | Admitting: Orthopedic Surgery

## 2016-02-24 DIAGNOSIS — E119 Type 2 diabetes mellitus without complications: Secondary | ICD-10-CM | POA: Diagnosis not present

## 2016-02-24 DIAGNOSIS — Z01818 Encounter for other preprocedural examination: Secondary | ICD-10-CM | POA: Diagnosis present

## 2016-02-24 LAB — APTT: aPTT: 37 seconds — ABNORMAL HIGH (ref 24–36)

## 2016-02-24 LAB — CBC
HCT: 39.8 % (ref 39.0–52.0)
Hemoglobin: 13.7 g/dL (ref 13.0–17.0)
MCH: 34 pg (ref 26.0–34.0)
MCHC: 34.4 g/dL (ref 30.0–36.0)
MCV: 98.8 fL (ref 78.0–100.0)
Platelets: 207 10*3/uL (ref 150–400)
RBC: 4.03 MIL/uL — ABNORMAL LOW (ref 4.22–5.81)
RDW: 13.5 % (ref 11.5–15.5)
WBC: 6.5 10*3/uL (ref 4.0–10.5)

## 2016-02-24 LAB — COMPREHENSIVE METABOLIC PANEL
ALT: 22 U/L (ref 17–63)
AST: 27 U/L (ref 15–41)
Albumin: 3.9 g/dL (ref 3.5–5.0)
Alkaline Phosphatase: 43 U/L (ref 38–126)
Anion gap: 7 (ref 5–15)
BUN: 17 mg/dL (ref 6–20)
CO2: 27 mmol/L (ref 22–32)
Calcium: 9.4 mg/dL (ref 8.9–10.3)
Chloride: 106 mmol/L (ref 101–111)
Creatinine, Ser: 0.74 mg/dL (ref 0.61–1.24)
GFR calc Af Amer: >60 mL/min (ref >60)
GFR calc non Af Amer: >60 mL/min (ref >60)
Glucose, Bld: 175 mg/dL — ABNORMAL HIGH (ref 65–99)
Potassium: 4.3 mmol/L (ref 3.5–5.1)
Sodium: 140 mmol/L (ref 135–145)
Total Bilirubin: 1.3 mg/dL — ABNORMAL HIGH (ref 0.3–1.2)
Total Protein: 6.5 g/dL (ref 6.5–8.1)

## 2016-02-24 LAB — GLUCOSE, CAPILLARY: Glucose-Capillary: 200 mg/dL — ABNORMAL HIGH (ref 65–99)

## 2016-02-24 LAB — TYPE AND SCREEN
ABO/RH(D): A POS
Antibody Screen: POSITIVE
DAT, IgG: NEGATIVE

## 2016-02-24 LAB — PROTIME-INR
INR: 0.97
Prothrombin Time: 12.9 seconds (ref 11.4–15.2)

## 2016-02-24 LAB — SURGICAL PCR SCREEN
MRSA, PCR: NEGATIVE
Staphylococcus aureus: NEGATIVE

## 2016-02-25 LAB — HEMOGLOBIN A1C
Hgb A1c MFr Bld: 6.8 % — ABNORMAL HIGH (ref 4.8–5.6)
Mean Plasma Glucose: 148 mg/dL

## 2016-03-01 NOTE — Progress Notes (Signed)
Left message on cell number and spoke with wife katherine at home number wife aware of surgery time change and to arrive at 615 am 03-04-15 [place] short stay

## 2016-03-03 ENCOUNTER — Inpatient Hospital Stay (HOSPITAL_COMMUNITY): Payer: Medicare Other | Admitting: Anesthesiology

## 2016-03-03 ENCOUNTER — Encounter (HOSPITAL_COMMUNITY): Payer: Self-pay | Admitting: *Deleted

## 2016-03-03 ENCOUNTER — Encounter (HOSPITAL_COMMUNITY)
Admission: RE | Disposition: A | Discharge status: Home Health | Payer: Self-pay | Source: Ambulatory Visit | Attending: Orthopedic Surgery

## 2016-03-03 ENCOUNTER — Inpatient Hospital Stay (HOSPITAL_COMMUNITY)
Admission: RE | Admit: 2016-03-03 | Discharge: 2016-03-05 | DRG: 468 | Disposition: A | Discharge status: Home Health | Payer: Medicare Other | Source: Ambulatory Visit | Attending: Orthopedic Surgery | Admitting: Orthopedic Surgery

## 2016-03-03 DIAGNOSIS — N400 Enlarged prostate without lower urinary tract symptoms: Secondary | ICD-10-CM | POA: Diagnosis present

## 2016-03-03 DIAGNOSIS — Z96651 Presence of right artificial knee joint: Secondary | ICD-10-CM | POA: Diagnosis present

## 2016-03-03 DIAGNOSIS — Z7982 Long term (current) use of aspirin: Secondary | ICD-10-CM

## 2016-03-03 DIAGNOSIS — E119 Type 2 diabetes mellitus without complications: Secondary | ICD-10-CM | POA: Diagnosis present

## 2016-03-03 DIAGNOSIS — E7800 Pure hypercholesterolemia, unspecified: Secondary | ICD-10-CM | POA: Diagnosis present

## 2016-03-03 DIAGNOSIS — M9712XA Periprosthetic fracture around internal prosthetic left knee joint, initial encounter: Secondary | ICD-10-CM | POA: Diagnosis present

## 2016-03-03 DIAGNOSIS — M25562 Pain in left knee: Secondary | ICD-10-CM | POA: Diagnosis present

## 2016-03-03 DIAGNOSIS — G4733 Obstructive sleep apnea (adult) (pediatric): Secondary | ICD-10-CM | POA: Diagnosis present

## 2016-03-03 DIAGNOSIS — M978XXS Periprosthetic fracture around other internal prosthetic joint, sequela: Secondary | ICD-10-CM

## 2016-03-03 DIAGNOSIS — M175 Other unilateral secondary osteoarthritis of knee: Secondary | ICD-10-CM | POA: Diagnosis present

## 2016-03-03 DIAGNOSIS — K219 Gastro-esophageal reflux disease without esophagitis: Secondary | ICD-10-CM | POA: Diagnosis present

## 2016-03-03 DIAGNOSIS — Z96659 Presence of unspecified artificial knee joint: Secondary | ICD-10-CM

## 2016-03-03 DIAGNOSIS — T8482XA Fibrosis due to internal orthopedic prosthetic devices, implants and grafts, initial encounter: Secondary | ICD-10-CM | POA: Diagnosis present

## 2016-03-03 DIAGNOSIS — K22719 Barrett's esophagus with dysplasia, unspecified: Secondary | ICD-10-CM | POA: Diagnosis present

## 2016-03-03 DIAGNOSIS — Z87891 Personal history of nicotine dependence: Secondary | ICD-10-CM

## 2016-03-03 DIAGNOSIS — Z79899 Other long term (current) drug therapy: Secondary | ICD-10-CM

## 2016-03-03 DIAGNOSIS — H9190 Unspecified hearing loss, unspecified ear: Secondary | ICD-10-CM | POA: Diagnosis present

## 2016-03-03 HISTORY — PX: TOTAL KNEE ARTHROPLASTY WITH REVISION COMPONENTS: SHX6198

## 2016-03-03 LAB — GLUCOSE, CAPILLARY
Glucose-Capillary: 151 mg/dL — ABNORMAL HIGH (ref 65–99)
Glucose-Capillary: 192 mg/dL — ABNORMAL HIGH (ref 65–99)
Glucose-Capillary: 199 mg/dL — ABNORMAL HIGH (ref 65–99)
Glucose-Capillary: 132 mg/dL — ABNORMAL HIGH (ref 65–99)

## 2016-03-03 SURGERY — TOTAL KNEE ARTHROPLASTY WITH REVISION COMPONENTS
Anesthesia: General | Site: Knee | Laterality: Left

## 2016-03-03 MED ORDER — BUPIVACAINE HCL (PF) 0.25 % IJ SOLN
INTRAMUSCULAR | Status: AC
  Filled 2016-03-03: qty 30

## 2016-03-03 MED ORDER — HYDROMORPHONE HCL 1 MG/ML IJ SOLN
INTRAMUSCULAR | Status: AC
  Filled 2016-03-03: qty 1

## 2016-03-03 MED ORDER — FLEET ENEMA 7-19 GM/118ML RE ENEM: 1.0000 | ENEMA | Freq: Once | RECTAL | Status: DC | PRN

## 2016-03-03 MED ORDER — RIVAROXABAN 10 MG PO TABS
10.0000 mg | ORAL_TABLET | Freq: Every day | ORAL | Status: DC
  Administered 2016-03-04 – 2016-03-05 (×2): 10 mg via ORAL
  Filled 2016-03-03 (×2): qty 1

## 2016-03-03 MED ORDER — LIDOCAINE 2% (20 MG/ML) 5 ML SYRINGE
INTRAMUSCULAR | Status: DC | PRN
  Administered 2016-03-03: 60 mg via INTRAVENOUS

## 2016-03-03 MED ORDER — ONDANSETRON HCL 4 MG PO TABS: 4.0000 mg | ORAL_TABLET | Freq: Four times a day (QID) | ORAL | Status: DC | PRN

## 2016-03-03 MED ORDER — STERILE WATER FOR IRRIGATION IR SOLN
Status: DC | PRN
Start: 2016-03-03 — End: 2016-03-03
  Administered 2016-03-03: 2000 mL

## 2016-03-03 MED ORDER — METHOCARBAMOL 1000 MG/10ML IJ SOLN
500.0000 mg | Freq: Four times a day (QID) | INTRAVENOUS | Status: DC | PRN
  Administered 2016-03-03: 500 mg via INTRAVENOUS
  Filled 2016-03-03: qty 5
  Filled 2016-03-03: qty 550

## 2016-03-03 MED ORDER — SODIUM CHLORIDE 0.9 % IR SOLN
Status: DC | PRN
  Administered 2016-03-03: 1000 mL

## 2016-03-03 MED ORDER — ACETAMINOPHEN 500 MG PO TABS
1000.0000 mg | ORAL_TABLET | Freq: Four times a day (QID) | ORAL | Status: AC
  Administered 2016-03-03 – 2016-03-04 (×4): 1000 mg via ORAL
  Filled 2016-03-03 (×4): qty 2

## 2016-03-03 MED ORDER — FENTANYL CITRATE (PF) 100 MCG/2ML IJ SOLN
50.0000 ug | Freq: Once | INTRAMUSCULAR | Status: AC
  Administered 2016-03-03: 50 ug via INTRAVENOUS

## 2016-03-03 MED ORDER — HYDROMORPHONE HCL 1 MG/ML IJ SOLN
0.2500 mg | INTRAMUSCULAR | Status: DC | PRN
  Administered 2016-03-03 (×6): 0.5 mg via INTRAVENOUS

## 2016-03-03 MED ORDER — METOCLOPRAMIDE HCL 5 MG PO TABS: 5.0000 mg | ORAL_TABLET | Freq: Three times a day (TID) | ORAL | Status: DC | PRN

## 2016-03-03 MED ORDER — DOXYCYCLINE HYCLATE 100 MG PO TABS
200.0000 mg | ORAL_TABLET | Freq: Every day | ORAL | Status: DC
  Administered 2016-03-04 – 2016-03-05 (×2): 200 mg via ORAL
  Filled 2016-03-03 (×2): qty 2

## 2016-03-03 MED ORDER — SUCCINYLCHOLINE CHLORIDE 200 MG/10ML IV SOSY
PREFILLED_SYRINGE | INTRAVENOUS | Status: DC | PRN
  Administered 2016-03-03: 120 mg via INTRAVENOUS

## 2016-03-03 MED ORDER — ACETAMINOPHEN 325 MG PO TABS: 650.0000 mg | ORAL_TABLET | Freq: Four times a day (QID) | ORAL | Status: DC | PRN

## 2016-03-03 MED ORDER — CEFAZOLIN SODIUM-DEXTROSE 2-4 GM/100ML-% IV SOLN
2.0000 g | Freq: Four times a day (QID) | INTRAVENOUS | Status: AC
  Administered 2016-03-03 (×2): 2 g via INTRAVENOUS
  Filled 2016-03-03 (×2): qty 100

## 2016-03-03 MED ORDER — SUGAMMADEX SODIUM 200 MG/2ML IV SOLN
INTRAVENOUS | Status: AC
  Filled 2016-03-03: qty 2

## 2016-03-03 MED ORDER — CEFAZOLIN SODIUM-DEXTROSE 2-4 GM/100ML-% IV SOLN
INTRAVENOUS | Status: AC
  Filled 2016-03-03: qty 100

## 2016-03-03 MED ORDER — HYDROMORPHONE HCL 1 MG/ML IJ SOLN: 1.0000 mg | Freq: Once | INTRAMUSCULAR | Status: DC

## 2016-03-03 MED ORDER — LACTATED RINGERS IV SOLN
INTRAVENOUS | Status: DC
  Administered 2016-03-03 (×2): via INTRAVENOUS

## 2016-03-03 MED ORDER — ACETAMINOPHEN 10 MG/ML IV SOLN
INTRAVENOUS | Status: AC
  Filled 2016-03-03: qty 100

## 2016-03-03 MED ORDER — ROPIVACAINE HCL 7.5 MG/ML IJ SOLN
INTRAMUSCULAR | Status: DC | PRN
  Administered 2016-03-03: 20 mL via PERINEURAL

## 2016-03-03 MED ORDER — HYDROMORPHONE HCL 1 MG/ML IJ SOLN: 0.5000 mg | INTRAMUSCULAR | Status: DC | PRN

## 2016-03-03 MED ORDER — BUPIVACAINE LIPOSOME 1.3 % IJ SUSP
20.0000 mL | Freq: Once | INTRAMUSCULAR | Status: DC
  Filled 2016-03-03: qty 20

## 2016-03-03 MED ORDER — PROMETHAZINE HCL 25 MG/ML IJ SOLN: 6.2500 mg | INTRAMUSCULAR | Status: DC | PRN

## 2016-03-03 MED ORDER — ATORVASTATIN CALCIUM 20 MG PO TABS
80.0000 mg | ORAL_TABLET | ORAL | Status: DC
  Administered 2016-03-03: 17:00:00 80 mg via ORAL
  Filled 2016-03-03: qty 4

## 2016-03-03 MED ORDER — SODIUM CHLORIDE 0.9 % IJ SOLN
INTRAMUSCULAR | Status: AC
  Filled 2016-03-03: qty 50

## 2016-03-03 MED ORDER — 0.9 % SODIUM CHLORIDE (POUR BTL) OPTIME
TOPICAL | Status: DC | PRN
  Administered 2016-03-03: 1000 mL

## 2016-03-03 MED ORDER — DEXAMETHASONE SODIUM PHOSPHATE 10 MG/ML IJ SOLN
INTRAMUSCULAR | Status: AC
  Filled 2016-03-03: qty 1

## 2016-03-03 MED ORDER — DOCUSATE SODIUM 100 MG PO CAPS
100.0000 mg | ORAL_CAPSULE | Freq: Two times a day (BID) | ORAL | Status: DC
Start: 2016-03-03 — End: 2016-03-05
  Administered 2016-03-03 – 2016-03-05 (×4): 100 mg via ORAL
  Filled 2016-03-03 (×4): qty 1

## 2016-03-03 MED ORDER — FENTANYL CITRATE (PF) 100 MCG/2ML IJ SOLN
INTRAMUSCULAR | Status: AC
  Filled 2016-03-03: qty 4

## 2016-03-03 MED ORDER — ONDANSETRON HCL 4 MG/2ML IJ SOLN
INTRAMUSCULAR | Status: DC | PRN
  Administered 2016-03-03: 4 mg via INTRAVENOUS

## 2016-03-03 MED ORDER — METOCLOPRAMIDE HCL 5 MG/ML IJ SOLN: 5.0000 mg | Freq: Three times a day (TID) | INTRAMUSCULAR | Status: DC | PRN

## 2016-03-03 MED ORDER — POLYETHYLENE GLYCOL 3350 17 G PO PACK: 17.0000 g | PACK | Freq: Every day | ORAL | Status: DC | PRN

## 2016-03-03 MED ORDER — SODIUM CHLORIDE 0.9 % IV SOLN
1000.0000 mg | INTRAVENOUS | Status: AC
  Administered 2016-03-03: 1000 mg via INTRAVENOUS
  Filled 2016-03-03: qty 1100

## 2016-03-03 MED ORDER — LIDOCAINE 2% (20 MG/ML) 5 ML SYRINGE
INTRAMUSCULAR | Status: AC
  Filled 2016-03-03: qty 5

## 2016-03-03 MED ORDER — SODIUM CHLORIDE 0.9 % IV SOLN
INTRAVENOUS | Status: DC
  Administered 2016-03-03 – 2016-03-04 (×2): via INTRAVENOUS

## 2016-03-03 MED ORDER — ACETAMINOPHEN 650 MG RE SUPP: 650.0000 mg | Freq: Four times a day (QID) | RECTAL | Status: DC | PRN

## 2016-03-03 MED ORDER — MORPHINE SULFATE (PF) 2 MG/ML IV SOLN
1.0000 mg | INTRAVENOUS | Status: DC | PRN
  Administered 2016-03-03: 15:00:00 1 mg via INTRAVENOUS
  Filled 2016-03-03: qty 1

## 2016-03-03 MED ORDER — ROPIVACAINE HCL 7.5 MG/ML IJ SOLN
INTRAMUSCULAR | Status: AC
  Filled 2016-03-03: qty 20

## 2016-03-03 MED ORDER — ONDANSETRON HCL 4 MG/2ML IJ SOLN
INTRAMUSCULAR | Status: AC
  Filled 2016-03-03: qty 2

## 2016-03-03 MED ORDER — CEFAZOLIN SODIUM-DEXTROSE 2-4 GM/100ML-% IV SOLN
2.0000 g | INTRAVENOUS | Status: AC
  Administered 2016-03-03: 2 g via INTRAVENOUS
  Filled 2016-03-03: qty 100

## 2016-03-03 MED ORDER — ACETAMINOPHEN 10 MG/ML IV SOLN
1000.0000 mg | Freq: Once | INTRAVENOUS | Status: AC
Start: 2016-03-03 — End: 2016-03-03
  Administered 2016-03-03: 1000 mg via INTRAVENOUS
  Filled 2016-03-03: qty 100

## 2016-03-03 MED ORDER — BISACODYL 10 MG RE SUPP: 10.0000 mg | Freq: Every day | RECTAL | Status: DC | PRN

## 2016-03-03 MED ORDER — OXYCODONE HCL 5 MG PO TABS
10.0000 mg | ORAL_TABLET | ORAL | Status: DC | PRN
  Administered 2016-03-03 (×2): 15 mg via ORAL
  Administered 2016-03-03: 14:00:00 10 mg via ORAL
  Administered 2016-03-04 – 2016-03-05 (×9): 15 mg via ORAL
  Filled 2016-03-03 (×2): qty 3
  Filled 2016-03-03: qty 4
  Filled 2016-03-03 (×6): qty 3
  Filled 2016-03-03: qty 2
  Filled 2016-03-03 (×2): qty 3

## 2016-03-03 MED ORDER — METHOCARBAMOL 500 MG PO TABS
500.0000 mg | ORAL_TABLET | Freq: Four times a day (QID) | ORAL | Status: DC | PRN
  Administered 2016-03-04 – 2016-03-05 (×3): 500 mg via ORAL
  Filled 2016-03-03 (×3): qty 1

## 2016-03-03 MED ORDER — PROPOFOL 10 MG/ML IV BOLUS
INTRAVENOUS | Status: AC
  Filled 2016-03-03: qty 60

## 2016-03-03 MED ORDER — INSULIN ASPART 100 UNIT/ML ~~LOC~~ SOLN
0.0000 [IU] | Freq: Three times a day (TID) | SUBCUTANEOUS | Status: DC
  Administered 2016-03-03: 3 [IU] via SUBCUTANEOUS
  Administered 2016-03-04: 5 [IU] via SUBCUTANEOUS
  Administered 2016-03-04 (×2): 3 [IU] via SUBCUTANEOUS
  Administered 2016-03-05: 08:00:00 2 [IU] via SUBCUTANEOUS
  Filled 2016-03-03 (×2): qty 1

## 2016-03-03 MED ORDER — FENTANYL CITRATE (PF) 100 MCG/2ML IJ SOLN: 50.0000 ug | INTRAMUSCULAR | Status: AC

## 2016-03-03 MED ORDER — PHENOL 1.4 % MT LIQD
1.0000 | OROMUCOSAL | Status: DC | PRN
Start: 2016-03-03 — End: 2016-03-05

## 2016-03-03 MED ORDER — ATORVASTATIN CALCIUM 20 MG PO TABS
40.0000 mg | ORAL_TABLET | ORAL | Status: DC
  Administered 2016-03-04: 18:00:00 40 mg via ORAL
  Filled 2016-03-03: qty 2

## 2016-03-03 MED ORDER — PIOGLITAZONE HCL-METFORMIN HCL 15-500 MG PO TABS: 1.0000 | ORAL_TABLET | Freq: Every day | ORAL | Status: DC

## 2016-03-03 MED ORDER — SODIUM CHLORIDE 0.9 % IJ SOLN
INTRAMUSCULAR | Status: DC | PRN
Start: 2016-03-03 — End: 2016-03-03
  Administered 2016-03-03: 30 mL

## 2016-03-03 MED ORDER — BUPIVACAINE LIPOSOME 1.3 % IJ SUSP
INTRAMUSCULAR | Status: DC | PRN
  Administered 2016-03-03: 20 mL

## 2016-03-03 MED ORDER — DEXAMETHASONE SODIUM PHOSPHATE 10 MG/ML IJ SOLN
10.0000 mg | Freq: Once | INTRAMUSCULAR | Status: AC
  Administered 2016-03-03: 10 mg via INTRAVENOUS

## 2016-03-03 MED ORDER — MENTHOL 3 MG MT LOZG: 1.0000 | LOZENGE | OROMUCOSAL | Status: DC | PRN

## 2016-03-03 MED ORDER — FENTANYL CITRATE (PF) 250 MCG/5ML IJ SOLN
INTRAMUSCULAR | Status: DC | PRN
  Administered 2016-03-03 (×3): 50 ug via INTRAVENOUS

## 2016-03-03 MED ORDER — ONDANSETRON HCL 4 MG/2ML IJ SOLN: 4.0000 mg | Freq: Four times a day (QID) | INTRAMUSCULAR | Status: DC | PRN

## 2016-03-03 MED ORDER — METFORMIN HCL 500 MG PO TABS
500.0000 mg | ORAL_TABLET | Freq: Every day | ORAL | Status: DC
  Administered 2016-03-04 – 2016-03-05 (×2): 500 mg via ORAL
  Filled 2016-03-03 (×2): qty 1

## 2016-03-03 MED ORDER — SUCCINYLCHOLINE CHLORIDE 200 MG/10ML IV SOSY
PREFILLED_SYRINGE | INTRAVENOUS | Status: AC
  Filled 2016-03-03: qty 10

## 2016-03-03 MED ORDER — DIPHENHYDRAMINE HCL 12.5 MG/5ML PO ELIX
12.5000 mg | ORAL_SOLUTION | ORAL | Status: DC | PRN
  Administered 2016-03-04: 25 mg via ORAL
  Administered 2016-03-04: 12.5 mg via ORAL
  Filled 2016-03-03: qty 5
  Filled 2016-03-03: qty 10

## 2016-03-03 MED ORDER — MIDAZOLAM HCL 2 MG/2ML IJ SOLN
INTRAMUSCULAR | Status: AC
  Filled 2016-03-03: qty 2

## 2016-03-03 MED ORDER — DEXAMETHASONE SODIUM PHOSPHATE 10 MG/ML IJ SOLN
10.0000 mg | Freq: Once | INTRAMUSCULAR | Status: AC
  Administered 2016-03-04: 10 mg via INTRAVENOUS
  Filled 2016-03-03: qty 1

## 2016-03-03 MED ORDER — FINASTERIDE 5 MG PO TABS
5.0000 mg | ORAL_TABLET | Freq: Every day | ORAL | Status: DC
  Administered 2016-03-03 – 2016-03-04 (×2): 5 mg via ORAL
  Filled 2016-03-03 (×2): qty 1

## 2016-03-03 MED ORDER — PANTOPRAZOLE SODIUM 40 MG PO TBEC
40.0000 mg | DELAYED_RELEASE_TABLET | Freq: Every day | ORAL | Status: DC
Start: 2016-03-04 — End: 2016-03-04
  Administered 2016-03-04: 08:00:00 40 mg via ORAL
  Filled 2016-03-03: qty 1

## 2016-03-03 MED ORDER — TRAMADOL HCL 50 MG PO TABS: 50.0000 mg | ORAL_TABLET | Freq: Four times a day (QID) | ORAL | Status: DC | PRN

## 2016-03-03 MED ORDER — ROCURONIUM BROMIDE 50 MG/5ML IV SOSY
PREFILLED_SYRINGE | INTRAVENOUS | Status: AC
  Filled 2016-03-03: qty 5

## 2016-03-03 MED ORDER — MIDAZOLAM HCL 2 MG/2ML IJ SOLN
2.0000 mg | Freq: Once | INTRAMUSCULAR | Status: AC
  Administered 2016-03-03: 2 mg via INTRAVENOUS

## 2016-03-03 MED ORDER — FENTANYL CITRATE (PF) 100 MCG/2ML IJ SOLN: 100.0000 ug | Freq: Once | INTRAMUSCULAR | Status: DC

## 2016-03-03 MED ORDER — PIOGLITAZONE HCL 15 MG PO TABS
15.0000 mg | ORAL_TABLET | Freq: Every day | ORAL | Status: DC
  Administered 2016-03-04 – 2016-03-05 (×2): 15 mg via ORAL
  Filled 2016-03-03 (×2): qty 1

## 2016-03-03 MED ORDER — NALOXEGOL OXALATE 25 MG PO TABS
25.0000 mg | ORAL_TABLET | Freq: Every day | ORAL | Status: DC
Start: 2016-03-04 — End: 2016-03-05
  Administered 2016-03-04 – 2016-03-05 (×2): 25 mg via ORAL
  Filled 2016-03-03 (×2): qty 1

## 2016-03-03 MED ORDER — PROPOFOL 10 MG/ML IV BOLUS
INTRAVENOUS | Status: DC | PRN
  Administered 2016-03-03: 150 mg via INTRAVENOUS

## 2016-03-03 SURGICAL SUPPLY — 51 items
AUG TIB SZ3 10 REV STP WDG (Knees) ×1 IMPLANT
BAG DECANTER FOR FLEXI CONT (MISCELLANEOUS) ×3 IMPLANT
BAG ZIPLOCK 12X15 (MISCELLANEOUS) ×3 IMPLANT
BANDAGE ACE 6X5 VEL STRL LF (GAUZE/BANDAGES/DRESSINGS) ×3 IMPLANT
BLADE SAG 18X100X1.27 (BLADE) ×3 IMPLANT
BLADE SAW SGTL 11.0X1.19X90.0M (BLADE) ×3 IMPLANT
BONE CEMENT GENTAMICIN (Cement) ×9 IMPLANT
CAP KNEE TOTAL 3 SIGMA ×3 IMPLANT
CEMENT BONE GENTAMICIN 40 (Cement) ×3 IMPLANT
CEMENT RESTRICTOR DEPUY SZ 4 (Cement) ×3 IMPLANT
CLOSURE WOUND 1/2 X4 (GAUZE/BANDAGES/DRESSINGS) ×2
CLOTH BEACON ORANGE TIMEOUT ST (SAFETY) ×3 IMPLANT
CUFF TOURN SGL QUICK 34 (TOURNIQUET CUFF) ×2
CUFF TRNQT CYL 34X4X40X1 (TOURNIQUET CUFF) ×1 IMPLANT
DRAPE U-SHAPE 47X51 STRL (DRAPES) ×3 IMPLANT
DRSG ADAPTIC 3X8 NADH LF (GAUZE/BANDAGES/DRESSINGS) ×3 IMPLANT
DURAPREP 26ML APPLICATOR (WOUND CARE) ×3 IMPLANT
ELECT REM PT RETURN 9FT ADLT (ELECTROSURGICAL) ×3
ELECTRODE REM PT RTRN 9FT ADLT (ELECTROSURGICAL) ×1 IMPLANT
EVACUATOR 1/8 PVC DRAIN (DRAIN) ×3 IMPLANT
GAUZE SPONGE 4X4 12PLY STRL (GAUZE/BANDAGES/DRESSINGS) ×3 IMPLANT
GLOVE BIO SURGEON STRL SZ7.5 (GLOVE) ×12 IMPLANT
GLOVE BIO SURGEON STRL SZ8 (GLOVE) ×3 IMPLANT
GLOVE BIOGEL PI IND STRL 7.5 (GLOVE) ×1 IMPLANT
GLOVE BIOGEL PI IND STRL 8 (GLOVE) ×1 IMPLANT
GLOVE BIOGEL PI INDICATOR 7.5 (GLOVE) ×2
GLOVE BIOGEL PI INDICATOR 8 (GLOVE) ×2
GLOVE SURG SS PI 7.5 STRL IVOR (GLOVE) ×6 IMPLANT
GOWN STRL REUS W/TWL LRG LVL3 (GOWN DISPOSABLE) ×3 IMPLANT
GOWN STRL REUS W/TWL XL LVL3 (GOWN DISPOSABLE) ×9 IMPLANT
HANDPIECE INTERPULSE COAX TIP (DISPOSABLE) ×2
IMMOBILIZER KNEE 20 (SOFTGOODS) ×3
IMMOBILIZER KNEE 20 THIGH 36 (SOFTGOODS) ×1 IMPLANT
MANIFOLD NEPTUNE II (INSTRUMENTS) ×3 IMPLANT
PACK TOTAL KNEE CUSTOM (KITS) ×3 IMPLANT
PAD ABD 8X10 STRL (GAUZE/BANDAGES/DRESSINGS) ×3 IMPLANT
PADDING CAST COTTON 6X4 STRL (CAST SUPPLIES) ×9 IMPLANT
POSITIONER SURGICAL ARM (MISCELLANEOUS) ×3 IMPLANT
SET HNDPC FAN SPRY TIP SCT (DISPOSABLE) ×1 IMPLANT
STEM TIBIA PFC 13X30MM (Stem) ×3 IMPLANT
STRIP CLOSURE SKIN 1/2X4 (GAUZE/BANDAGES/DRESSINGS) ×4 IMPLANT
SUT VIC AB 2-0 CT1 27 (SUTURE) ×6
SUT VIC AB 2-0 CT1 TAPERPNT 27 (SUTURE) ×3 IMPLANT
SUT VLOC 180 0 24IN GS25 (SUTURE) ×3 IMPLANT
SYR 50ML LL SCALE MARK (SYRINGE) ×6 IMPLANT
TOWER CARTRIDGE SMART MIX (DISPOSABLE) ×3 IMPLANT
TRAY FOLEY W/METER SILVER 16FR (SET/KITS/TRAYS/PACK) ×3 IMPLANT
TRAY REVISION SZ 4 (Knees) ×3 IMPLANT
TRAY SLEEVE CEM ML (Knees) ×3 IMPLANT
WEDGE SZ 3 10MM (Knees) ×3 IMPLANT
WRAP KNEE MAXI GEL POST OP (GAUZE/BANDAGES/DRESSINGS) ×3 IMPLANT

## 2016-03-03 NOTE — Brief Op Note (Signed)
03/03/2016  11:12 AM  PATIENT:  Ronn MelenaGary P Neumann  76 y.o. male  PRE-OPERATIVE DIAGNOSIS:  Failed Left knee unicompartmental arthroplasty   POST-OPERATIVE DIAGNOSIS:  Failed Left knee unicompartmental arthroplasty   PROCEDURE:  Procedure(s) with comments: REVISION LEFT KNEE UNICOMPARTMENTAL ARTHROPLASTY TO TOTAL KNEE ARTHROPLASTY (Left) - requests 2hrs; Adductor Block  SURGEON:  Surgeon(s) and Role:    * Ollen GrossFrank Klaryssa Fauth, MD - Primary  PHYSICIAN ASSISTANT:   ASSISTANTS: Avel Peacerew Perkins, PA-C   ANESTHESIA:   General plus adductor canal block  EBL:  Total I/O In: 1000 [I.V.:1000] Out: 200 [Urine:100; Blood:100]  BLOOD ADMINISTERED:none  DRAINS: (Medium) Hemovact drain(s) in the left knee with  Suction Open   LOCAL MEDICATIONS USED:  OTHER Exparel  COUNTS:  YES  TOURNIQUET:   Total Tourniquet Time Documented: Thigh (Left) - 73 minutes Total: Thigh (Left) - 73 minutes   DICTATION: .Other Dictation: Dictation Number [id number]  PLAN OF CARE: Admit to inpatient   PATIENT DISPOSITION:  PACU - hemodynamically stable.
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
## 2016-03-03 NOTE — Anesthesia Procedure Notes (Signed)
Anesthesia Regional Block:  Adductor canal block  Pre-Anesthetic Checklist: ,, timeout performed, Correct Patient, Correct Site, Correct Laterality, Correct Procedure, Correct Position, site marked, Risks and benefits discussed,  Surgical consent,  Pre-op evaluation,  At surgeon's request and post-op pain management  Laterality: Left  Prep: chloraprep       Needles:  Injection technique: Single-shot  Needle Type: Echogenic Needle     Needle Length: 9cm 9 cm Needle Gauge: 21 G    Additional Needles:  Procedures: ultrasound guided (picture in chart) Adductor canal block Narrative:  Start time: 03/03/2016 8:36 AM End time: 03/03/2016 8:46 AM Injection made incrementally with aspirations every 5 mL.  Performed by: Personally  Anesthesiologist: Arnoldo Hildreth  Additional Notes: Patient tolerated the procedure well without complications

## 2016-03-03 NOTE — Anesthesia Procedure Notes (Signed)
Procedure Name: Intubation Date/Time: 03/03/2016 9:28 AM Performed by: Dione Booze Pre-anesthesia Checklist: Emergency Drugs available, Suction available, Patient being monitored and Patient identified Patient Re-evaluated:Patient Re-evaluated prior to inductionOxygen Delivery Method: Circle system utilized Preoxygenation: Pre-oxygenation with 100% oxygen Intubation Type: IV induction Laryngoscope Size: Mac and 4 Grade View: Grade II Tube type: Oral Tube size: 7.5 mm Number of attempts: 1 Airway Equipment and Method: Stylet Placement Confirmation: ETT inserted through vocal cords under direct vision,  positive ETCO2 and breath sounds checked- equal and bilateral Secured at: 22 cm Tube secured with: Tape Dental Injury: Teeth and Oropharynx as per pre-operative assessment

## 2016-03-03 NOTE — Anesthesia Preprocedure Evaluation (Addendum)
Anesthesia Evaluation  Patient identified by MRN, date of birth, ID band Patient awake    Reviewed: Allergy & Precautions, NPO status , Patient's Chart, lab work & pertinent test results  Airway Mallampati: II  TM Distance: >3 FB Neck ROM: Full    Dental no notable dental hx.    Pulmonary sleep apnea , former smoker,    Pulmonary exam normal breath sounds clear to auscultation       Cardiovascular negative cardio ROS Normal cardiovascular exam Rhythm:Regular Rate:Normal     Neuro/Psych negative neurological ROS  negative psych ROS   GI/Hepatic negative GI ROS, Neg liver ROS,   Endo/Other  diabetes  Renal/GU negative Renal ROS  negative genitourinary   Musculoskeletal negative musculoskeletal ROS (+)   Abdominal   Peds negative pediatric ROS (+)  Hematology negative hematology ROS (+)   Anesthesia Other Findings   Reproductive/Obstetrics negative OB ROS                             Anesthesia Physical Anesthesia Plan  ASA: II  Anesthesia Plan: General   Post-op Pain Management:    Induction: Intravenous  Airway Management Planned: LMA and Oral ETT  Additional Equipment:   Intra-op Plan:   Post-operative Plan: Extubation in OR  Informed Consent: I have reviewed the patients History and Physical, chart, labs and discussed the procedure including the risks, benefits and alternatives for the proposed anesthesia with the patient or authorized representative who has indicated his/her understanding and acceptance.   Dental advisory given  Plan Discussed with: CRNA and Surgeon  Anesthesia Plan Comments:        Anesthesia Quick Evaluation

## 2016-03-03 NOTE — Progress Notes (Signed)
Pt. seen about CPAP, refused at this time, made aware to notify if needed.

## 2016-03-03 NOTE — Progress Notes (Signed)
Assisted Dr. Rose with left, ultrasound guided, adductor canal block. Side rails up, monitors on throughout procedure. See vital signs in flow sheet. Tolerated Procedure well.  

## 2016-03-03 NOTE — Interval H&P Note (Signed)
History and Physical Interval Note:  03/03/2016 9:11 AM  [name]  has presented today for surgery, with the diagnosis of Failed Left knee unicompartmental arthroplasty   The various methods of treatment have been discussed with the patient and family. After consideration of risks, benefits and other options for treatment, the patient has consented to  Procedure(s) with comments: Revision Left knee unicompartmental arthroplasty to Total Knee Arthroplasty (Left) - requests 2hrs as a surgical intervention .  The patient's history has been reviewed, patient examined, no change in status, stable for surgery.  I have reviewed the patient's chart and labs.  Questions were answered to the patient's satisfaction.     Loanne DrillingALUISIO,Tayley Mudrick V

## 2016-03-03 NOTE — Anesthesia Postprocedure Evaluation (Addendum)
Anesthesia Post Note  Patient: Ronn MelenaGary P Mcbeth  Procedure(s) Performed: Procedure(s) (LRB): REVISION LEFT KNEE UNICOMPARTMENTAL ARTHROPLASTY TO TOTAL KNEE ARTHROPLASTY (Left)  Patient location during evaluation: PACU Anesthesia Type: General and Regional Level of consciousness: awake and alert Pain management: pain level controlled Vital Signs Assessment: post-procedure vital signs reviewed and stable Respiratory status: spontaneous breathing, nonlabored ventilation, respiratory function stable and patient connected to nasal cannula oxygen Cardiovascular status: blood pressure returned to baseline and stable Postop Assessment: no signs of nausea or vomiting Anesthetic complications: no       Last Vitals:  Vitals:   03/03/16 1315 03/03/16 1335  BP: 136/75 (!) 141/81  Pulse: 81 83  Resp: 12 16  Temp:  36.7 C    Last Pain:  Vitals:   03/03/16 1315  TempSrc:   PainSc: 4                  Jobany Montellano S

## 2016-03-03 NOTE — Transfer of Care (Signed)
Immediate Anesthesia Transfer of Care Note  Patient: [name] MelenaGary P Dyer  Procedure(s) Performed: Procedure(s) with comments: REVISION LEFT KNEE UNICOMPARTMENTAL ARTHROPLASTY TO TOTAL KNEE ARTHROPLASTY (Left) - requests 2hrs; Adductor Block  Patient Location: PACU  Anesthesia Type:General and Regional  Level of Consciousness: awake and patient cooperative  Airway & Oxygen Therapy: Patient Spontanous Breathing and Patient connected to face mask oxygen  Post-op Assessment: Report given to RN and Post -op Vital signs reviewed and stable  Post vital signs: Reviewed and stable  Last Vitals:  Vitals:   03/03/16 0634  BP: 127/67  Pulse: 64  Resp: 18  Temp: 36.5 C    Last Pain:  Vitals:   03/03/16 0634  TempSrc: Oral      Patients Stated Pain Goal: 3 (03/03/16 16100635)  Complications: No apparent anesthesia complications

## 2016-03-03 NOTE — Evaluation (Signed)
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  Pt should progress to dc home with family assist and HHPT follow up.    Follow Up Recommendations Home health PT    Equipment Recommendations  None recommended by PT    Recommendations for Other Services OT consult     Precautions / Restrictions Precautions Precautions: Knee;Fall Required Braces or Orthoses: Knee Immobilizer - Left Knee Immobilizer - Left: Discontinue once straight leg raise with < 10 degree lag Restrictions Weight Bearing Restrictions: No Other Position/Activity Restrictions: WBAT      Mobility  Bed Mobility Overal bed mobility: Needs Assistance Bed Mobility: Supine to Sit     Supine to sit: Min assist     General bed mobility comments: cues for sequence and use of R LE to self assist  Transfers Overall transfer level: Needs assistance Equipment used: Rolling walker (2 wheeled) Transfers: Sit to/from Stand Sit to Stand: Min assist         General transfer comment: cues for LE management and use of UEs to self assist  Ambulation/Gait Ambulation/Gait assistance: Min assist Ambulation Distance (Feet): 55 Feet Assistive device: Rolling walker (2 wheeled) Gait Pattern/deviations: Step-to pattern;Decreased step length - right;Decreased step length - left;Shuffle;Trunk flexed Gait velocity: decr Gait velocity interpretation: Below normal speed for age/gender General Gait Details: cues for sequence, posture and position from [employer]            Wheelchair Mobility    Modified Rankin (Stroke Patients Only)       Balance Overall balance assessment: No apparent balance deficits (not formally  assessed)                                           Pertinent Vitals/Pain Pain Assessment: 0-10 Pain Score: 5  Pain Location: L knee Pain Descriptors / Indicators: Aching;Sore Pain Intervention(s): Limited activity within patient's tolerance;Monitored during session;Premedicated before session;Ice applied    Home Living Family/patient expects to be discharged to:: Private residence Living Arrangements: Spouse/significant other Available Help at Discharge: Family;Available 24 hours/day Type of Home: House Home Access: Stairs to enter Entrance Stairs-Rails: None Entrance Stairs-Number of Steps: 1+1 Home Layout: One level Home Equipment: Walker - 2 wheels;Bedside commode;Grab bars - tub/shower;Shower seat - built in;Crutches;Cane - single point Additional Comments: has a grab and 1/2 wall to push up from    Prior Function Level of Independence: Independent with assistive device(s)   Gait / Transfers Assistance Needed: Pt using RW           Hand Dominance   Dominant Hand: Right    Extremity/Trunk Assessment   Upper Extremity Assessment Upper Extremity Assessment: Overall WFL for tasks assessed    Lower Extremity Assessment Lower Extremity Assessment: LLE deficits/detail    Cervical / Trunk Assessment Cervical / Trunk Assessment: Normal  Communication   Communication: No difficulties  Cognition Arousal/Alertness: Awake/alert Behavior During Therapy: WFL for tasks assessed/performed Overall Cognitive Status: Within Functional Limits for tasks assessed  General Comments      Exercises Total Joint Exercises Ankle Circles/Pumps: AROM;Both;15 reps;Supine   Assessment/Plan    PT Assessment Patient needs continued PT services  PT Problem List Decreased strength;Decreased range of motion;Decreased activity tolerance;Decreased mobility;Decreased knowledge of use of DME;Pain          PT Treatment Interventions DME  instruction;Stair training;Gait training;Functional mobility training;Therapeutic activities;Therapeutic exercise;Patient/family education    PT Goals (Current goals can be found in the Care Plan section)  Acute Rehab PT Goals Patient Stated Goal: walk without assistive device PT Goal Formulation: With patient Time For Goal Achievement: 03/06/16 Potential to Achieve Goals: Good    Frequency 7X/week   Barriers to discharge        Co-evaluation               End of Session Equipment Utilized During Treatment: Gait belt;Left knee immobilizer Activity Tolerance: Patient tolerated treatment well Patient left: in chair;with call bell/phone within reach;with family/visitor present Nurse Communication: Mobility status         Time: 1645-1710 PT Time Calculation (min) (ACUTE ONLY): 25 min   Charges:   PT Evaluation $PT Eval Low Complexity: 1 Procedure PT Treatments $Gait Training: 8-22 mins   PT G Codes:        Juanita Devincent 03/03/2016, 5:32 PM

## 2016-03-04 LAB — BASIC METABOLIC PANEL
Anion gap: 5 (ref 5–15)
BUN: 14 mg/dL (ref 6–20)
CO2: 25 mmol/L (ref 22–32)
Calcium: 8.2 mg/dL — ABNORMAL LOW (ref 8.9–10.3)
Chloride: 109 mmol/L (ref 101–111)
Creatinine, Ser: 0.87 mg/dL (ref 0.61–1.24)
GFR calc Af Amer: >60 mL/min (ref >60)
GFR calc non Af Amer: >60 mL/min (ref >60)
Glucose, Bld: 145 mg/dL — ABNORMAL HIGH (ref 65–99)
Potassium: 3.5 mmol/L (ref 3.5–5.1)
Sodium: 139 mmol/L (ref 135–145)

## 2016-03-04 LAB — CBC
HCT: 31.9 % — ABNORMAL LOW (ref 39.0–52.0)
Hemoglobin: 10.8 g/dL — ABNORMAL LOW (ref 13.0–17.0)
MCH: 32.3 pg (ref 26.0–34.0)
MCHC: 33.9 g/dL (ref 30.0–36.0)
MCV: 95.5 fL (ref 78.0–100.0)
Platelets: 166 10*3/uL (ref 150–400)
RBC: 3.34 MIL/uL — ABNORMAL LOW (ref 4.22–5.81)
RDW: 13.3 % (ref 11.5–15.5)
WBC: 9.3 10*3/uL (ref 4.0–10.5)

## 2016-03-04 LAB — GLUCOSE, CAPILLARY
Glucose-Capillary: 163 mg/dL — ABNORMAL HIGH (ref 65–99)
Glucose-Capillary: 200 mg/dL — ABNORMAL HIGH (ref 65–99)
Glucose-Capillary: 208 mg/dL — ABNORMAL HIGH (ref 65–99)
Glucose-Capillary: 177 mg/dL — ABNORMAL HIGH (ref 65–99)

## 2016-03-04 MED ORDER — NON FORMULARY: 20.0000 mg | Freq: Every day | Status: DC

## 2016-03-04 MED ORDER — OMEPRAZOLE 20 MG PO CPDR
20.0000 mg | DELAYED_RELEASE_CAPSULE | Freq: Every day | ORAL | Status: DC
  Administered 2016-03-05: 11:00:00 20 mg via ORAL
  Filled 2016-03-04: qty 1

## 2016-03-04 NOTE — Progress Notes (Signed)
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  Therex deferred to after next pain meds.  Follow Up Recommendations  Home health PT     Equipment Recommendations  None recommended by PT    Recommendations for Other Services OT consult     Precautions / Restrictions Precautions Precautions: Knee;Fall Required Braces or Orthoses: Knee Immobilizer - Left Knee Immobilizer - Left: Discontinue once straight leg raise with < 10 degree lag Restrictions Weight Bearing Restrictions: No Other Position/Activity Restrictions: WBAT    Mobility  Bed Mobility         Supine to sit: Min assist     General bed mobility comments: OOB with OT  Transfers Overall transfer level: Needs assistance Equipment used: Rolling walker (2 wheeled) Transfers: Sit to/from Stand Sit to Stand: Min guard         General transfer comment: cues to extend leg when sitting and for use of UEs to self assist  Ambulation/Gait Ambulation/Gait assistance: Min assist Ambulation Distance (Feet): 111 Feet Assistive device: Rolling walker (2 wheeled) Gait Pattern/deviations: Step-to pattern;Decreased step length - right;Decreased step length - left;Shuffle;Trunk flexed Gait velocity: decr Gait velocity interpretation: Below normal speed for age/gender General Gait Details: cues for sequence, posture and position from [name]   Stairs            Wheelchair Mobility    Modified Rankin (Stroke Patients Only)       Balance Overall balance assessment: No apparent balance deficits (not formally assessed)                                  Cognition Arousal/Alertness: Awake/alert Behavior During Therapy: WFL for tasks assessed/performed Overall Cognitive Status:  Within Functional Limits for tasks assessed                      Exercises      General Comments        Pertinent Vitals/Pain Pain Assessment: 0-10 Pain Score: 6  Pain Location: L knee Pain Descriptors / Indicators: Aching;Sore Pain Intervention(s): Limited activity within patient's tolerance;Monitored during session;Premedicated before session;Ice applied    Home Living Family/patient expects to be discharged to:: Private residence Living Arrangements: Spouse/significant other Available Help at Discharge: Family;Available 24 hours/day         Home Equipment: Walker - 2 wheels;Bedside commode;Grab bars - tub/shower;Shower seat - built in;Crutches;Cane - single point Additional Comments: has a grab and 1/2 wall to push up from: doesn't use 3:1 over toilet.  Needs a new shower seat    Prior Function Level of Independence: Independent with assistive device(s)  Gait / Transfers Assistance Needed: Pt using RW   Comments: pt has been able to gets socks on from supine position; wife can assist as needed   PT Goals (current goals can now be found in the care plan section) Acute Rehab PT Goals Patient Stated Goal: walk without assistive device PT Goal Formulation: With patient Time For Goal Achievement: 03/06/16 Potential to Achieve Goals: Good Progress towards PT goals: Progressing toward goals    Frequency    7X/week      PT Plan Current plan remains appropriate  Co-evaluation             End of Session           Time: 8119-1478 PT Time Calculation (min) (ACUTE ONLY): 18 min  Charges:  $Gait Training: 8-22 mins                    G Codes:      [name] 2016-03-29, 11:37 AM

## 2016-03-04 NOTE — Progress Notes (Signed)
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), lumbar fusion and DM    PT Comments    Steady progress with mobility with continued c/o increased pain with attempts to extend knee  Follow Up Recommendations  Home health PT     Equipment Recommendations  None recommended by PT    Recommendations for Other Services OT consult     Precautions / Restrictions Precautions Precautions: Knee;Fall Required Braces or Orthoses: Knee Immobilizer - Left Knee Immobilizer - Left: Discontinue once straight leg raise with < 10 degree lag Restrictions Weight Bearing Restrictions: No Other Position/Activity Restrictions: WBAT    Mobility  Bed Mobility Overal bed mobility: Needs Assistance Bed Mobility: Supine to Sit     Supine to sit: Min assist     General bed mobility comments: cues for sequence and use of R LE to self assist  Transfers Overall transfer level: Needs assistance Equipment used: Rolling walker (2 wheeled) Transfers: Sit to/from Stand Sit to Stand: Min guard         General transfer comment: cues to extend leg when sitting and for use of UEs to self assist  Ambulation/Gait Ambulation/Gait assistance: Min guard Ambulation Distance (Feet): 147 Feet Assistive device: Rolling walker (2 wheeled) Gait Pattern/deviations: Step-to pattern;Decreased step length - right;Decreased step length - left;Shuffle;Trunk flexed Gait velocity: decr Gait velocity interpretation: Below normal speed for age/gender General Gait Details: cues for sequence, posture and position from [employer]            Wheelchair Mobility    Modified Rankin (Stroke Patients Only)       Balance                                    Cognition Arousal/Alertness: Awake/alert Behavior During Therapy: WFL for tasks  assessed/performed Overall Cognitive Status: Within Functional Limits for tasks assessed                      Exercises Total Joint Exercises Ankle Circles/Pumps: AROM;Both;15 reps;Supine Quad Sets: AROM;Both;10 reps;Supine Heel Slides: AAROM;Left;15 reps;Supine Straight Leg Raises: AAROM;Left;15 reps;Supine Goniometric ROM: AAROM L knee -10 - 55    General Comments        Pertinent Vitals/Pain Pain Assessment: 0-10 Pain Score: 4  Pain Location: L knee Pain Descriptors / Indicators: Aching;Sore Pain Intervention(s): Limited activity within patient's tolerance;Premedicated before session;Monitored during session    Home Living                      Prior Function            PT Goals (current goals can now be found in the care plan section) Acute Rehab PT Goals Patient Stated Goal: walk without assistive device PT Goal Formulation: With patient Time For Goal Achievement: 03/06/16 Potential to Achieve Goals: Good Progress towards PT goals: Progressing toward goals    Frequency    7X/week      PT Plan Current plan remains appropriate    Co-evaluation             End of Session Equipment Utilized During Treatment: Gait belt;Left knee immobilizer Activity Tolerance: Patient tolerated treatment well Patient left: in bed;with call bell/phone within reach     Time:  [id number] PT Time Calculation (min) (ACUTE ONLY): 18 min  Charges:  $Gait Training: 8-22 mins $Therapeutic Exercise: 8-22 mins                    G Codes:      Avaleen Brownley 03/04/2016, 2:13 PM

## 2016-03-04 NOTE — Progress Notes (Signed)
Subjective: 1 Day Post-Op Procedure(s) (LRB): REVISION LEFT KNEE UNICOMPARTMENTAL ARTHROPLASTY TO TOTAL KNEE ARTHROPLASTY (Left) Patient reports pain as mild.   Patient seen in rounds by Dr. Lequita HaltAluisio. Patient is well, but has had some minor complaints of pain in the knee, requiring pain medications We will start therapy today.  Plan is to go Home after hospital stay.  Objective: Vital signs in last 24 hours: Temp:  [98 F (36.7 C)-98.7 F (37.1 C)] 98.1 F (36.7 C) (01/25 0533) Pulse Rate:  [70-98] 70 (01/25 0533) Resp:  [7-22] 18 (01/25 0533) BP: (112-161)/(58-111) 124/66 (01/25 0533) SpO2:  [94 %-100 %] 94 % (01/25 0533)  Intake/Output from previous day:  Intake/Output Summary (Last 24 hours) at 03/04/16 0828 Last data filed at 03/04/16 0617  Gross per 24 hour  Intake             3085 ml  Output             2155 ml  Net              930 ml    Intake/Output this shift: No intake/output data recorded.  Labs:  Recent Labs  03/04/16 0441  HGB 10.8*    Recent Labs  03/04/16 0441  WBC 9.3  RBC 3.34*  HCT 31.9*  PLT 166    Recent Labs  03/04/16 0441  NA 139  K 3.5  CL 109  CO2 25  BUN 14  CREATININE 0.87  GLUCOSE 145*  CALCIUM 8.2*   No results for input(s): LABPT, INR in the last 72 hours.  EXAM General - Patient is Alert, Appropriate and Oriented Extremity - Neurovascular intact Sensation intact distally Dorsiflexion/Plantar flexion intact Dressing - dressing C/D/I Motor Function - intact, moving foot and toes well on exam.  Hemovac pulled without difficulty.  Past Medical History:  Diagnosis Date  . 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 Barrett's esophagus with dysplasia   . Chronic lower back pain    DDD  . Closed patellar sleeve fracture of left knee   . Constipation   . Crush injury knee    bilateral  . Dysphagia    EGD with dilitation  . Enlarged prostate    sees a urologist  . GERD  (gastroesophageal reflux disease)    takes Omeprazole daily  . Hard of hearing   . Hyperlipidemia    takes Lipitor and Fish Oil daily  . Insomnia    d/t pain  . Joint pain   . Joint swelling   . Left tibial fracture   . Multiple duodenal ulcers   . Nocturia   . OSA on CPAP   . Pneumonia    "about 5 years ago"  . Type II diabetes mellitus (HCC)    type 2  . Unsteady gait   . Urinary frequency   . Wears glasses     Assessment/Plan: 1 Day Post-Op Procedure(s) (LRB): REVISION LEFT KNEE UNICOMPARTMENTAL ARTHROPLASTY TO TOTAL KNEE ARTHROPLASTY (Left) Active Problems:   OA (osteoarthritis) of knee  Estimated body mass index is 33.77 kg/m as calculated from the following:   Height as of this encounter: 5\' 7"  (1.702 m).   Weight as of this encounter: 97.8 kg (215 lb 9.6 oz). Advance diet Up with therapy Plan for discharge tomorrow Discharge home with home health  - Home with HHPT through [facility]  DVT Prophylaxis - Xarelto Weight-Bearing as tolerated to left leg D/C  O2 and Pulse OX and try on Room Air  Avel Peace, PA-C Orthopaedic Surgery 03/04/2016, 8:28 AM

## 2016-03-04 NOTE — Progress Notes (Signed)
Pt declined cpap tonight.  Pt was advised that RT is available all night should he change his mind. ?
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
## 2016-03-04 NOTE — Evaluation (Signed)
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  All education was completed. No further OT is needed at this time    Follow Up Recommendations  Supervision/Assistance - 24 hour    Equipment Recommendations  None recommended by OT (pt will look into shower seat)    Recommendations for Other Services       Precautions / Restrictions Precautions Precautions: Knee;Fall Required Braces or Orthoses: Knee Immobilizer - Left Knee Immobilizer - Left: Discontinue once straight leg raise with < 10 degree lag Restrictions Weight Bearing Restrictions: No Other Position/Activity Restrictions: WBAT      Mobility Bed Mobility         Supine to sit: Min assist     General bed mobility comments: assist for LLE  Transfers   Equipment used: Rolling walker (2 wheeled)   Sit to Stand: Min guard;From elevated surface         General transfer comment: cues to extend leg when sitting    Balance                                            ADL Overall ADL's : Needs assistance/impaired     Grooming: Oral care;Supervision/safety;Standing       Lower Body Bathing: Minimal assistance;Sit to/from stand       Lower Body Dressing: Moderate assistance;Sit to/from stand   Toilet Transfer: Min guard;Ambulation;RW (chair)             General ADL Comments: pt verbalizes that he has been backing into shower and coming out forward with RW.  Educated on keeping KI on until he is sitting then reapplying once he pats dry.  His wife can assist with water temp as it takes awhile to get warm.  He states he needs a new shower seat:  these are an out of pocket expense, and he will look into it.  Pt feels his comfort height  commode set up will be fine.  He has a 3:1 to go over toilet if needed     Vision     Perception     Praxis      Pertinent Vitals/Pain Pain Score: 6  (when standing/ambulating) Pain Location: L knee Pain Descriptors / Indicators: Aching;Sore Pain Intervention(s): Limited activity within patient's tolerance;Monitored during session;Premedicated before session;Repositioned;Ice applied     Hand Dominance     Extremity/Trunk Assessment Upper Extremity Assessment Upper Extremity Assessment: Overall WFL for tasks assessed           Communication Communication Communication: No difficulties   Cognition Arousal/Alertness: Awake/alert Behavior During Therapy: WFL for tasks assessed/performed Overall Cognitive Status: Within Functional Limits for tasks assessed                     General Comments       Exercises       Shoulder Instructions      Home Living Family/patient expects to be discharged to:: Private residence Living Arrangements: Spouse/significant other Available Help at Discharge: Family;Available 24 hours/day  Bathroom Shower/Tub: Producer, television/film/videoWalk-in shower   Bathroom Toilet: Handicapped height     Home Equipment: Environmental consultantWalker - 2 wheels;Bedside commode;Grab bars - tub/shower;Shower seat - built in;Crutches;Cane - single point   Additional Comments: has a grab and 1/2 wall to push up from: doesn't use 3:1 over toilet.  Needs a new shower seat      Prior Functioning/Environment Level of Independence: Independent with assistive device(s)  Gait / Transfers Assistance Needed: Pt using RW     Comments: pt has been able to gets socks on from supine position; wife can assist as needed        OT Problem List:     OT Treatment/Interventions:      OT Goals(Current goals can be found in the care plan section) Acute Rehab OT Goals Patient Stated Goal: walk without assistive device OT Goal Formulation: All assessment and education complete, DC  therapy  OT Frequency:     Barriers to D/C:            Co-evaluation              End of Session    Activity Tolerance: Patient tolerated treatment well Patient left: in chair;with call bell/phone within reach   Time: [id number] OT Time Calculation (min): 19 min Charges:  OT General Charges $OT Visit: 1 Procedure OT Evaluation $OT Eval Low Complexity: 1 Procedure G-Codes:    [place] 03/04/2016, 8:34 AM Marica OtterMaryellen Yanira Tolsma, OTR/L [phone] 03/04/2016

## 2016-03-04 NOTE — Care Management Note (Signed)
Case Management Note  Patient Details  Name: [name] MRN: [id number] Date of Birth: 1940/03/11  Subjective/Objective:                  REVISION LEFT KNEE UNICOMPARTMENTAL ARTHROPLASTY TO TOTAL KNEE ARTHROPLASTY (Left) Action/Plan: Discharge planning Expected Discharge Date:  03/05/16               Expected Discharge Plan:  [place]  In-House Referral:     Discharge planning Services  CM Consult  Post Acute Care Choice:  Home Health Choice offered to:  Patient  DME Arranged:  Walker rolling DME Agency:  [place]:  PT [place] Agency:  [facility]  Status of Service:  Completed, signed off  If discussed at [employer] of Stay Meetings, dates discussed:    Additional Comments: CM met with pt in room to offer choice of home health agency. [facility] to render HHPT.  CM has requested face to face and HHPT orders.  Referral given to [facility] rep, Tim. CM notified [facility] [employer] rep, Brad to please deliver the rolling walker to room prior to discharge. No other CM needs were communicated. Dellie Catholic, RN 03/04/2016, 12:34 PM

## 2016-03-05 LAB — BASIC METABOLIC PANEL
Anion gap: 5 (ref 5–15)
BUN: 16 mg/dL (ref 6–20)
CO2: 26 mmol/L (ref 22–32)
Calcium: 8.3 mg/dL — ABNORMAL LOW (ref 8.9–10.3)
Chloride: 107 mmol/L (ref 101–111)
Creatinine, Ser: 0.85 mg/dL (ref 0.61–1.24)
GFR calc Af Amer: >60 mL/min (ref >60)
GFR calc non Af Amer: >60 mL/min (ref >60)
Glucose, Bld: 193 mg/dL — ABNORMAL HIGH (ref 65–99)
Potassium: 3.3 mmol/L — ABNORMAL LOW (ref 3.5–5.1)
Sodium: 138 mmol/L (ref 135–145)

## 2016-03-05 LAB — CBC
HCT: 31.7 % — ABNORMAL LOW (ref 39.0–52.0)
Hemoglobin: 10.8 g/dL — ABNORMAL LOW (ref 13.0–17.0)
MCH: 33.5 pg (ref 26.0–34.0)
MCHC: 34.1 g/dL (ref 30.0–36.0)
MCV: 98.4 fL (ref 78.0–100.0)
Platelets: 161 10*3/uL (ref 150–400)
RBC: 3.22 MIL/uL — ABNORMAL LOW (ref 4.22–5.81)
RDW: 13.5 % (ref 11.5–15.5)
WBC: 9.4 10*3/uL (ref 4.0–10.5)

## 2016-03-05 LAB — GLUCOSE, CAPILLARY: Glucose-Capillary: 122 mg/dL — ABNORMAL HIGH (ref 65–99)

## 2016-03-05 MED ORDER — TRAMADOL HCL 50 MG PO TABS: 50.0000 mg | ORAL_TABLET | Freq: Four times a day (QID) | ORAL | 0 refills | Status: DC | PRN

## 2016-03-05 MED ORDER — RIVAROXABAN 10 MG PO TABS: 10.0000 mg | ORAL_TABLET | Freq: Every day | ORAL | 0 refills | Status: DC

## 2016-03-05 MED ORDER — METHOCARBAMOL 500 MG PO TABS: 500.0000 mg | ORAL_TABLET | Freq: Four times a day (QID) | ORAL | 0 refills | Status: DC | PRN

## 2016-03-05 MED ORDER — OXYCODONE HCL 10 MG PO TABS: 10.0000 mg | ORAL_TABLET | ORAL | 0 refills | Status: DC | PRN

## 2016-03-05 NOTE — Discharge Instructions (Signed)
° °Dr. Frank Aluisio °Total Joint Specialist °[facility] °[address] °[place], [place] [address] °[phone] ° °TOTAL KNEE REPLACEMENT POSTOPERATIVE DIRECTIONS ° °Knee Rehabilitation, Guidelines Following Surgery  °Results after knee surgery are often greatly improved when you follow the exercise, range of motion and muscle strengthening exercises prescribed by your doctor. Safety measures are also important to protect the knee from further injury. Any time any of these exercises cause you to have increased pain or swelling in your knee joint, decrease the amount until you are comfortable again and slowly increase them. If you have problems or questions, call your caregiver or physical therapist for advice.  ° °HOME CARE INSTRUCTIONS  °Remove items at home which could result in a fall. This includes throw rugs or furniture in walking pathways.  °· ICE to the affected knee every three hours for 30 minutes at a time and then as needed for pain and swelling.  Continue to use ice on the knee for pain and swelling from surgery. You may notice swelling that will progress down to the foot and ankle.  This is normal after surgery.  Elevate the leg when you are not up walking on it.   °· Continue to use the breathing machine which will help keep your temperature down.  It is common for your temperature to cycle up and down following surgery, especially at night when you are not up moving around and exerting yourself.  The breathing machine keeps your lungs expanded and your temperature down. °· Do not place pillow under knee, focus on keeping the knee straight while resting ° °DIET °You may resume your previous home diet once your are discharged from the hospital. ° °DRESSING / WOUND CARE / SHOWERING °You may shower 3 days after surgery, but keep the wounds dry during showering.  You may use an occlusive plastic wrap (Press'n Seal for example), NO SOAKING/SUBMERGING IN THE BATHTUB.  If the  bandage gets wet, change with a clean dry gauze.  If the incision gets wet, pat the wound dry with a clean towel. °You may start showering once you are discharged home but do not submerge the incision under water. Just pat the incision dry and apply a dry gauze dressing on daily. °Change the surgical dressing daily and reapply a dry dressing each time. ° °ACTIVITY °Walk with your walker as instructed. °Use walker as long as suggested by your caregivers. °Avoid periods of inactivity such as sitting longer than an hour when not asleep. This helps prevent blood clots.  °You may resume a sexual relationship in one month or when given the OK by your doctor.  °You may return to work once you are cleared by your doctor.  °Do not drive a car for 6 weeks or until released by you surgeon.  °Do not drive while taking narcotics. ° °WEIGHT BEARING °Weight bearing as tolerated with assist device (walker, cane, etc) as directed, use it as long as suggested by your surgeon or therapist, typically at least 4-6 weeks. ° °POSTOPERATIVE CONSTIPATION PROTOCOL °Constipation - defined medically as fewer than three stools per week and severe constipation as less than one stool per week. ° °One of the most common issues patients have following surgery is constipation.  Even if you have a regular bowel pattern at home, your normal regimen is likely to be disrupted due to multiple reasons following surgery.  Combination of anesthesia, postoperative narcotics, change in appetite and fluid intake all can affect your bowels.    In order to avoid complications following surgery, here are some recommendations in order to help you during your recovery period. ° °Colace (docusate) - Pick up an over-the-counter form of Colace or another stool softener and take twice a day as long as you are requiring postoperative pain medications.  Take with a full glass of water daily.  If you experience loose stools or diarrhea, hold the colace until you stool forms  back up.  If your symptoms do not get better within 1 week or if they get worse, check with your doctor. ° °Dulcolax (bisacodyl) - Pick up over-the-counter and take as directed by the product packaging as needed to assist with the movement of your bowels.  Take with a full glass of water.  Use this product as needed if not relieved by Colace only.  ° °MiraLax (polyethylene glycol) - Pick up over-the-counter to have on hand.  MiraLax is a solution that will increase the amount of water in your bowels to assist with bowel movements.  Take as directed and can mix with a glass of water, juice, soda, coffee, or tea.  Take if you go more than two days without a movement. °Do not use MiraLax more than once per day. Call your doctor if you are still constipated or irregular after using this medication for 7 days in a row. ° °If you continue to have problems with postoperative constipation, please contact the office for further assistance and recommendations.  If you experience "the worst abdominal pain ever" or develop nausea or vomiting, please contact the office immediatly for further recommendations for treatment. ° °ITCHING ° If you experience itching with your medications, try taking only a single pain pill, or even half a pain pill at a time.  You can also use Benadryl over the counter for itching or also to help with sleep.  ° °TED HOSE STOCKINGS °Wear the elastic stockings on both legs for three weeks following surgery during the day but you may remove then at night for sleeping. ° °MEDICATIONS °See your medication summary on the “After Visit Summary” that the nursing staff will review with you prior to discharge.  You may have some home medications which will be placed on hold until you complete the course of blood thinner medication.  It is important for you to complete the blood thinner medication as prescribed by your surgeon.  Continue your approved medications as instructed at time of  discharge. ° °PRECAUTIONS °If you experience chest pain or shortness of breath - call 911 immediately for transfer to the hospital emergency department.  °If you develop a fever greater that 101 F, purulent drainage from wound, increased redness or drainage from wound, foul odor from the wound/dressing, or calf pain - CONTACT YOUR SURGEON.   °                                                °FOLLOW-UP APPOINTMENTS °Make sure you keep all of your appointments after your operation with your surgeon and caregivers. You should call the office at the above phone number and make an appointment for approximately two weeks after the date of your surgery or on the date instructed by your surgeon outlined in the "After Visit Summary". ° ° °RANGE OF MOTION AND STRENGTHENING EXERCISES  °Rehabilitation of the knee is important following a knee injury or   an operation. After just a few days of immobilization, the muscles of the thigh which control the knee become weakened and shrink (atrophy). Knee exercises are designed to build up the tone and strength of the thigh muscles and to improve knee motion. Often times heat used for twenty to thirty minutes before working out will loosen up your tissues and help with improving the range of motion but do not use heat for the first two weeks following surgery. These exercises can be done on a training (exercise) mat, on the floor, on a table or on a bed. Use what ever works the best and is most comfortable for you Knee exercises include:  °Leg Lifts - While your knee is still immobilized in a splint or cast, you can do straight leg raises. Lift the leg to 60 degrees, hold for 3 sec, and slowly lower the leg. Repeat 10-20 times 2-3 times daily. Perform this exercise against resistance later as your knee gets better.  °Quad and Hamstring Sets - Tighten up the muscle on the front of the thigh (Quad) and hold for 5-10 sec. Repeat this 10-20 times hourly. Hamstring sets are done by pushing the  foot backward against an object and holding for 5-10 sec. Repeat as with quad sets.  °· Leg Slides: Lying on your back, slowly slide your foot toward your buttocks, bending your knee up off the floor (only go as far as is comfortable). Then slowly slide your foot back down until your leg is flat on the floor again. °· Angel Wings: Lying on your back spread your legs to the side as far apart as you can without causing discomfort.  °A rehabilitation program following serious knee injuries can speed recovery and prevent re-injury in the future due to weakened muscles. Contact your doctor or a physical therapist for more information on knee rehabilitation.  ° °IF YOU ARE TRANSFERRED TO A SKILLED REHAB FACILITY °If the patient is transferred to a skilled rehab facility following release from the hospital, a list of the current medications will be sent to the facility for the patient to continue.  When discharged from the skilled rehab facility, please have the facility set up the patient's Home Health Physical Therapy prior to being released. Also, the skilled facility will be responsible for providing the patient with their medications at time of release from the facility to include their pain medication, the muscle relaxants, and their blood thinner medication. If the patient is still at the rehab facility at time of the two week follow up appointment, the skilled rehab facility will also need to assist the patient in arranging follow up appointment in our office and any transportation needs. ° °MAKE SURE YOU:  °Understand these instructions.  °Get help right away if you are not doing well or get worse.  ° ° °Pick up stool softner and laxative for home use following surgery while on pain medications. °Do not submerge incision under water. °Please use good hand washing techniques while changing dressing each day. °May shower starting three days after surgery. °Please use a clean towel to pat the incision dry following  showers. °Continue to use ice for pain and swelling after surgery. °Do not use any lotions or creams on the incision until instructed by your surgeon. ° °Take Xarelto for two and a half more weeks following discharge from the hospital, then discontinue Xarelto. °Once the patient has completed the Xarelto, they may resume the 81 mg Aspirin. ° ° ° ° °Information   on my medicine - XARELTO® (Rivaroxaban) ° °This medication education was reviewed with me or my healthcare representative as part of my discharge preparation.  The pharmacist that spoke with me during my hospital stay was:  Hussein Askar, Student-PharmD ° °Why was Xarelto® prescribed for you? °Xarelto® was prescribed for you to reduce the risk of blood clots forming after orthopedic surgery. The medical term for these abnormal blood clots is venous thromboembolism (VTE). ° °What do you need to know about xarelto® ? °Take your Xarelto® ONCE DAILY at the same time every day. °You may take it either with or without food. ° °If you have difficulty swallowing the tablet whole, you may crush it and mix in applesauce just prior to taking your dose. ° °Take Xarelto® exactly as prescribed by your doctor and DO NOT stop taking Xarelto® without talking to the doctor who prescribed the medication.  Stopping without other VTE prevention medication to take the place of Xarelto® may increase your risk of developing a clot. ° °After discharge, you should have regular check-up appointments with your healthcare provider that is prescribing your Xarelto®.   ° °What do you do if you miss a dose? °If you miss a dose, take it as soon as you remember on the same day then continue your regularly scheduled once daily regimen the next day. Do not take two doses of Xarelto® on the same day.  ° °Important Safety Information °A possible side effect of Xarelto® is bleeding. You should call your healthcare provider right away if you experience any of the following: °? Bleeding from an  injury or your nose that does not stop. °? Unusual colored urine (red or dark brown) or unusual colored stools (red or black). °? Unusual bruising for unknown reasons. °? A serious fall or if you hit your head (even if there is no bleeding). ° °Some medicines may interact with Xarelto® and might increase your risk of bleeding while on Xarelto®. To help avoid this, consult your healthcare provider or pharmacist prior to using any new prescription or non-prescription medications, including herbals, vitamins, non-steroidal anti-inflammatory drugs (NSAIDs) and supplements. ° °This website has more information on Xarelto®: www.[email or web address].com. ° ° ° °

## 2016-03-05 NOTE — Progress Notes (Signed)
Subjective: 2 Days Post-Op Procedure(s) (LRB): REVISION LEFT KNEE UNICOMPARTMENTAL ARTHROPLASTY TO TOTAL KNEE ARTHROPLASTY (Left) Patient reports pain as mild.   Patient seen in rounds by Dr. Lequita Halt. Patient is well, but has had some minor complaints of pain in the knee, requiring pain medications Patient is ready to go home today.  Objective: Vital signs in last 24 hours: Temp:  [98.5 F (36.9 C)-99.4 F (37.4 C)] 98.5 F (36.9 C) (01/26 0540) Pulse Rate:  [72-77] 72 (01/26 0540) Resp:  [14-15] 15 (01/26 0540) BP: (113-124)/(61-67) 113/64 (01/26 0540) SpO2:  [97 %-98 %] 98 % (01/26 0540)  Intake/Output from previous day:  Intake/Output Summary (Last 24 hours) at 03/05/16 0823 Last data filed at 03/05/16 0541  Gross per 24 hour  Intake          2301.17 ml  Output             1500 ml  Net           801.17 ml    Intake/Output this shift: No intake/output data recorded.  Labs:  Recent Labs  03/04/16 0441 03/05/16 0443  HGB 10.8* 10.8*    Recent Labs  03/04/16 0441 03/05/16 0443  WBC 9.3 9.4  RBC 3.34* 3.22*  HCT 31.9* 31.7*  PLT 166 161    Recent Labs  03/04/16 0441 03/05/16 0443  NA 139 138  K 3.5 3.3*  CL 109 107  CO2 25 26  BUN 14 16  CREATININE 0.87 0.85  GLUCOSE 145* 193*  CALCIUM 8.2* 8.3*   No results for input(s): LABPT, INR in the last 72 hours.  EXAM: General - Patient is Alert, Appropriate and Oriented Extremity - Neurovascular intact Sensation intact distally Dorsiflexion/Plantar flexion intact Incision - clean, dry Motor Function - intact, moving foot and toes well on exam.   Assessment/Plan: 2 Days Post-Op Procedure(s) (LRB): REVISION LEFT KNEE UNICOMPARTMENTAL ARTHROPLASTY TO TOTAL KNEE ARTHROPLASTY (Left) Procedure(s) (LRB): REVISION LEFT KNEE UNICOMPARTMENTAL ARTHROPLASTY TO TOTAL KNEE ARTHROPLASTY (Left) Past Medical History:  Diagnosis Date  . Arthritis    "hands; probably in my back too" (08/28/2015); osteo related    . Barrett's esophagus with dysplasia   . Chronic lower back pain    DDD  . Closed patellar sleeve fracture of left knee   . Constipation   . Crush injury knee    bilateral  . Dysphagia    EGD with dilitation  . Enlarged prostate    sees a urologist  . GERD (gastroesophageal reflux disease)    takes Omeprazole daily  . Hard of hearing   . Hyperlipidemia    takes Lipitor and Fish Oil daily  . Insomnia    d/t pain  . Joint pain   . Joint swelling   . Left tibial fracture   . Multiple duodenal ulcers   . Nocturia   . OSA on CPAP   . Pneumonia    "about 5 years ago"  . Type II diabetes mellitus (HCC)    type 2  . Unsteady gait   . Urinary frequency   . Wears glasses    Active Problems:   OA (osteoarthritis) of knee  Estimated body mass index is 33.77 kg/m as calculated from the following:   Height as of this encounter: 5\' 7"  (1.702 m).   Weight as of this encounter: 97.8 kg (215 lb 9.6 oz). Up with therapy Discharge home with home health Diet - Cardiac diet and Diabetic diet Follow up - in 2 weeks  Activity - WBAT Disposition - Home Condition Upon Discharge - Good D/C Meds - See DC Summary DVT Prophylaxis - Xarelto  Avel Peacerew Perkins, PA-C Orthopaedic Surgery 03/05/2016, 8:23 AM

## 2016-03-05 NOTE — Progress Notes (Signed)
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  Reviewed stairs, therex, KI and use of ice.  Follow Up Recommendations  Home health PT     Equipment Recommendations  None recommended by PT    Recommendations for Other Services OT consult     Precautions / Restrictions Precautions Precautions: Knee;Fall Required Braces or Orthoses: Knee Immobilizer - Left Knee Immobilizer - Left: Discontinue once straight leg raise with < 10 degree lag Restrictions Weight Bearing Restrictions: No Other Position/Activity Restrictions: WBAT    Mobility  Bed Mobility Overal bed mobility: Needs Assistance Bed Mobility: Supine to Sit     Supine to sit: Supervision     General bed mobility comments: Increased time with cues for sequence and cues for use fo R LE  Transfers Overall transfer level: Needs assistance Equipment used: Rolling walker (2 wheeled) Transfers: Sit to/from Stand Sit to Stand: Supervision         General transfer comment: cues to extend leg when sitting and for use of UEs to self assist  Ambulation/Gait Ambulation/Gait assistance: Min guard;Supervision Ambulation Distance (Feet): 200 Feet Assistive device: Rolling walker (2 wheeled) Gait Pattern/deviations: Decreased step length - right;Decreased step length - left;Shuffle;Trunk flexed;Step-to pattern;Step-through pattern Gait velocity: decr Gait velocity interpretation: Below normal speed for age/gender General Gait Details: cues for sequence, posture and position from RW   Stairs Stairs: Yes   Stair Management: No rails;Step to pattern;Backwards;With walker Number of Stairs: 3 General stair comments: single step 3x  Wheelchair Mobility    Modified Rankin (Stroke Patients Only)        Balance                                    Cognition Arousal/Alertness: Awake/alert Behavior During Therapy: WFL for tasks assessed/performed Overall Cognitive Status: Within Functional Limits for tasks assessed                      Exercises Total Joint Exercises Ankle Circles/Pumps: AROM;Both;15 reps;Supine Quad Sets: AROM;Both;10 reps;Supine Heel Slides: AAROM;Left;Supine;20 reps Straight Leg Raises: AAROM;Left;Supine;20 reps    General Comments        Pertinent Vitals/Pain Pain Assessment: 0-10 Pain Score: 7  Pain Location: L knee Pain Descriptors / Indicators: Aching;Sore Pain Intervention(s): Limited activity within patient's tolerance;Monitored during session;Premedicated before session;Ice applied    Home Living                      Prior Function            PT Goals (current goals can now be found in the care plan section) Acute Rehab PT Goals Patient Stated Goal: walk without assistive device PT Goal Formulation: With patient Time For Goal Achievement: 03/06/16 Potential to Achieve Goals: Good Progress towards PT goals: Progressing toward goals    Frequency    7X/week      PT Plan Current plan remains appropriate    Co-evaluation             End of Session Equipment Utilized During Treatment: Gait belt;Left knee immobilizer Activity Tolerance: Patient tolerated treatment well Patient left: in chair;with call bell/phone within reach  Time: 1610-9604 PT Time Calculation (min) (ACUTE ONLY): 42 min  Charges:  $Gait Training: 8-22 mins $Therapeutic Exercise: 8-22 mins $Therapeutic Activity: 8-22 mins                    G Codes:      Kortlyn Koltz 03-17-2016, 12:18 PM
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
## 2016-03-07 LAB — TYPE AND SCREEN
ABO/RH(D): A POS
Antibody Screen: POSITIVE
DAT, IgG: NEGATIVE
Unit division: 0
Unit division: 0

## 2016-04-11 ENCOUNTER — Encounter: Payer: Self-pay | Admitting: Neurology

## 2016-04-13 ENCOUNTER — Encounter: Payer: Self-pay | Admitting: Neurology

## 2016-04-13 ENCOUNTER — Ambulatory Visit (INDEPENDENT_AMBULATORY_CARE_PROVIDER_SITE_OTHER): Payer: Medicare Other | Admitting: Neurology

## 2016-04-13 VITALS — BP 124/70 | HR 76 | Resp 20 | Ht 67.0 in | Wt 210.0 lb

## 2016-04-13 DIAGNOSIS — G4733 Obstructive sleep apnea (adult) (pediatric): Secondary | ICD-10-CM | POA: Diagnosis not present

## 2016-04-13 DIAGNOSIS — Z9989 Dependence on other enabling machines and devices: Secondary | ICD-10-CM | POA: Diagnosis not present
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
## 2016-05-06 ENCOUNTER — Ambulatory Visit: Payer: Self-pay | Admitting: Orthopedic Surgery

## 2016-06-14 ENCOUNTER — Ambulatory Visit: Payer: Self-pay | Admitting: Orthopedic Surgery

## 2016-06-14 NOTE — Patient Instructions (Addendum)
[name] MelenaGary P Dyer  06/14/2016   Your procedure is scheduled on: 06/21/16  [facility]   Report to admitting at     1215PM   Call this number if you have problems the morning of surgery  [phone]   Remember: ONLY 1 PERSON MAY GO WITH YOU TO SHORT STAY TO GET  READY MORNING OF YOUR SURGERY.   Do not eat food s :After Midnight.   You  May have clear liquids until 8:50 am then nothing by mouth     Take these medicines the morning of surgery with A SIP OF WATER: propanalol, oxycodone if needed, omeprazole,gabapentin, doxycline, lipitor  DO NOT TAKE ANY DIABETIC MEDICATIONS DAY OF YOUR SURGERY                               You may not have any metal on your body including hair pins and              piercings  Do not wear jewelry, , lotions, powders or perfumes, deodorant                          Men may shave face and neck.   Do not bring valuables to the hospital. [facility] IS NOT             RESPONSIBLE   FOR VALUABLES.  Contacts, dentures or bridgework may not be worn into surgery.       Patients discharged the day of surgery will not be allowed to drive home.  Name and phone number of your driver:  Special Instructions: N/A              Please read over the following fact sheets you were given: _____________________________________________________________________             [facility] Pre[facility] Before surgery, you can play an important role.  Because skin is not sterile, your skin needs to be as free of germs as possible.  You can reduce the number of germs on your skin by washing with CHG (chlorahexidine gluconate) soap before surgery.  CHG is an antiseptic cleaner which kills germs and bonds with the skin to continue killing germs even after washing. Please DO NOT use if you have an allergy to CHG or antibacterial soaps.  If your skin becomes reddened/irritated stop using the CHG and inform your nurse when you  arrive at Short Stay. Do not shave (including legs and underarms) for at least 48 hours prior to the first CHG shower.  You may shave your face/neck. Please follow these instructions carefully:  1.  Shower with CHG Soap the night before surgery and the  morning of Surgery.  2.  If you choose to wash your hair, wash your hair first as usual with your  normal  shampoo.  3.  After you shampoo, rinse your hair and body thoroughly to remove the  shampoo.                           4.  Use CHG as you would any other liquid soap.  You can apply chg directly  to the skin and wash  Gently with a scrungie or clean washcloth.  5.  Apply the CHG Soap to your body ONLY FROM THE NECK DOWN.   Do not use on face/ open                           Wound or open sores. Avoid contact with eyes, ears mouth and genitals (private parts).                       Wash face,  Genitals (private parts) with your normal soap.             6.  Wash thoroughly, paying special attention to the area where your surgery  will be performed.  7.  Thoroughly rinse your body with warm water from the neck down.  8.  DO NOT shower/wash with your normal soap after using and rinsing off  the CHG Soap.                9.  Pat yourself dry with a clean towel.            10.  Wear clean pajamas.            11.  Place clean sheets on your bed the night of your first shower and do not  sleep with pets. Day of Surgery : Do not apply any lotions/deodorants the morning of surgery.  Please wear clean clothes to the hospital/surgery center.  FAILURE TO FOLLOW THESE INSTRUCTIONS MAY RESULT IN THE CANCELLATION OF YOUR SURGERY PATIENT SIGNATURE_________________________________  NURSE SIGNATURE__________________________________  ________________________________________________________________________   [name]  An incentive spirometer is a tool that can help keep your lungs clear and active. This tool measures how  well you are filling your lungs with each breath. Taking long deep breaths may help reverse or decrease the chance of developing breathing (pulmonary) problems (especially infection) following:  A long period of time when you are unable to move or be active. BEFORE THE PROCEDURE   If the spirometer includes an indicator to show your best effort, your nurse or respiratory therapist will set it to a desired goal.  If possible, sit up straight or lean slightly forward. Try not to slouch.  Hold the incentive spirometer in an upright position. INSTRUCTIONS FOR USE  1. Sit on the edge of your bed if possible, or sit up as far as you can in bed or on a chair. 2. Hold the incentive spirometer in an upright position. 3. Breathe out normally. 4. Place the mouthpiece in your mouth and seal your lips tightly around it. 5. Breathe in slowly and as deeply as possible, raising the piston or the ball toward the top of the column. 6. Hold your breath for 3-5 seconds or for as long as possible. Allow the piston or ball to fall to the bottom of the column. 7. Remove the mouthpiece from your mouth and breathe out normally. 8. Rest for a few seconds and repeat Steps 1 through 7 at least 10 times every 1-2 hours when you are awake. Take your time and take a few normal breaths between deep breaths. 9. The spirometer may include an indicator to show your best effort. Use the indicator as a goal to work toward during each repetition. 10. After each set of 10 deep breaths, practice coughing to be sure your lungs are clear. If you have an incision (the cut made at the time of surgery),  support your incision when coughing by placing a pillow or rolled up towels firmly against it. Once you are able to get out of bed, walk around indoors and cough well. You may stop using the incentive spirometer when instructed by your caregiver.  RISKS AND COMPLICATIONS  Take your time so you do not get dizzy or light-headed.  If you  are in pain, you may need to take or ask for pain medication before doing incentive spirometry. It is harder to take a deep breath if you are having pain. AFTER USE  Rest and breathe slowly and easily.  It can be helpful to keep track of a log of your progress. Your caregiver can provide you with a simple table to help with this. If you are using the spirometer at home, follow these instructions: [place] IF:   You are having difficultly using the spirometer.  You have trouble using the spirometer as often as instructed.  Your pain medication is not giving enough relief while using the spirometer.  You develop fever of 100.5 F (38.1 C) or higher. SEEK IMMEDIATE MEDICAL CARE IF:   You cough up bloody sputum that had not been present before.  You develop fever of 102 F (38.9 C) or greater.  You develop worsening pain at or near the incision site. MAKE SURE YOU:   Understand these instructions.  Will watch your condition.  Will get help right away if you are not doing well or get worse. Document Released: 06/07/2006 Document Revised: 04/19/2011 Document Reviewed: 08/08/2006 ExitCare Patient Information 2014 [place].   ________________________________________________________________________    CLEAR LIQUID DIET   Foods Allowed                                                                     Foods Excluded  Coffee and tea, regular and decaf                             liquids that you cannot  Plain Jell-O in any flavor                                             see through such as: Fruit ices (not with fruit pulp)                                     milk, soups, orange juice  Iced Popsicles                                    All solid food Carbonated beverages, regular and diet                                    Cranberry, grape and apple juices Sports drinks like Gatorade Lightly seasoned clear broth or consume(fat free) Sugar, honey  syrup  Sample Menu Breakfast  Lunch                                     Supper Cranberry juice                    Beef broth                            Chicken broth Jell-O                                     Grape juice                           Apple juice Coffee or tea                        Jell-O                                      Popsicle                                                Coffee or tea                        Coffee or tea  _____________________________________________________________________

## 2016-06-16 ENCOUNTER — Encounter (HOSPITAL_COMMUNITY): Payer: Self-pay

## 2016-06-16 ENCOUNTER — Encounter (HOSPITAL_COMMUNITY)
Admission: RE | Admit: 2016-06-16 | Discharge: 2016-06-16 | Disposition: A | Discharge status: Home / Self Care | Payer: Medicare Other | Source: Ambulatory Visit | Attending: Orthopedic Surgery | Admitting: Orthopedic Surgery

## 2016-06-16 DIAGNOSIS — M24662 Ankylosis, left knee: Secondary | ICD-10-CM | POA: Insufficient documentation

## 2016-06-16 DIAGNOSIS — Z01812 Encounter for preprocedural laboratory examination: Secondary | ICD-10-CM | POA: Insufficient documentation

## 2016-06-16 LAB — CBC
HCT: 42.5 % (ref 39.0–52.0)
Hemoglobin: 14.6 g/dL (ref 13.0–17.0)
MCH: 33.6 pg (ref 26.0–34.0)
MCHC: 34.4 g/dL (ref 30.0–36.0)
MCV: 97.9 fL (ref 78.0–100.0)
Platelets: 244 10*3/uL (ref 150–400)
RBC: 4.34 MIL/uL (ref 4.22–5.81)
RDW: 13.8 % (ref 11.5–15.5)
WBC: 6.5 10*3/uL (ref 4.0–10.5)

## 2016-06-16 LAB — BASIC METABOLIC PANEL
Anion gap: 7 (ref 5–15)
BUN: 16 mg/dL (ref 6–20)
CO2: 26 mmol/L (ref 22–32)
Calcium: 9.2 mg/dL (ref 8.9–10.3)
Chloride: 107 mmol/L (ref 101–111)
Creatinine, Ser: 0.96 mg/dL (ref 0.61–1.24)
GFR calc Af Amer: >60 mL/min (ref >60)
GFR calc non Af Amer: >60 mL/min (ref >60)
Glucose, Bld: 217 mg/dL — ABNORMAL HIGH (ref 65–99)
Potassium: 4.4 mmol/L (ref 3.5–5.1)
Sodium: 140 mmol/L (ref 135–145)

## 2016-06-16 LAB — GLUCOSE, CAPILLARY: Glucose-Capillary: 209 mg/dL — ABNORMAL HIGH (ref 65–99)

## 2016-06-16 NOTE — Progress Notes (Signed)
ekg 09/18/15 epic

## 2016-06-21 ENCOUNTER — Ambulatory Visit (HOSPITAL_COMMUNITY)
Admission: RE | Admit: 2016-06-21 | Discharge: 2016-06-21 | Disposition: A | Discharge status: Home / Self Care | Payer: Medicare Other | Source: Ambulatory Visit | Attending: Orthopedic Surgery | Admitting: Orthopedic Surgery

## 2016-06-21 ENCOUNTER — Encounter (HOSPITAL_COMMUNITY)
Admission: RE | Disposition: A | Discharge status: Home / Self Care | Payer: Self-pay | Source: Ambulatory Visit | Attending: Orthopedic Surgery

## 2016-06-21 ENCOUNTER — Other Ambulatory Visit: Payer: Self-pay | Admitting: Physician Assistant

## 2016-06-21 ENCOUNTER — Ambulatory Visit (HOSPITAL_COMMUNITY): Payer: Medicare Other | Admitting: Anesthesiology

## 2016-06-21 ENCOUNTER — Encounter (HOSPITAL_COMMUNITY): Payer: Self-pay | Admitting: *Deleted

## 2016-06-21 DIAGNOSIS — Z96652 Presence of left artificial knee joint: Secondary | ICD-10-CM | POA: Diagnosis not present

## 2016-06-21 DIAGNOSIS — I4891 Unspecified atrial fibrillation: Secondary | ICD-10-CM | POA: Diagnosis not present

## 2016-06-21 DIAGNOSIS — Z7982 Long term (current) use of aspirin: Secondary | ICD-10-CM | POA: Diagnosis not present

## 2016-06-21 DIAGNOSIS — N400 Enlarged prostate without lower urinary tract symptoms: Secondary | ICD-10-CM | POA: Diagnosis not present

## 2016-06-21 DIAGNOSIS — Z8719 Personal history of other diseases of the digestive system: Secondary | ICD-10-CM | POA: Diagnosis not present

## 2016-06-21 DIAGNOSIS — E119 Type 2 diabetes mellitus without complications: Secondary | ICD-10-CM | POA: Diagnosis not present

## 2016-06-21 DIAGNOSIS — Z7984 Long term (current) use of oral hypoglycemic drugs: Secondary | ICD-10-CM | POA: Diagnosis not present

## 2016-06-21 DIAGNOSIS — G4700 Insomnia, unspecified: Secondary | ICD-10-CM | POA: Diagnosis not present

## 2016-06-21 DIAGNOSIS — G4733 Obstructive sleep apnea (adult) (pediatric): Secondary | ICD-10-CM | POA: Insufficient documentation

## 2016-06-21 DIAGNOSIS — E785 Hyperlipidemia, unspecified: Secondary | ICD-10-CM | POA: Insufficient documentation

## 2016-06-21 DIAGNOSIS — Z79899 Other long term (current) drug therapy: Secondary | ICD-10-CM | POA: Diagnosis not present

## 2016-06-21 DIAGNOSIS — K219 Gastro-esophageal reflux disease without esophagitis: Secondary | ICD-10-CM | POA: Insufficient documentation

## 2016-06-21 DIAGNOSIS — I480 Paroxysmal atrial fibrillation: Secondary | ICD-10-CM

## 2016-06-21 DIAGNOSIS — M24662 Ankylosis, left knee: Secondary | ICD-10-CM | POA: Diagnosis present

## 2016-06-21 DIAGNOSIS — T8482XA Fibrosis due to internal orthopedic prosthetic devices, implants and grafts, initial encounter: Secondary | ICD-10-CM

## 2016-06-21 HISTORY — PX: KNEE CLOSED REDUCTION: SHX995

## 2016-06-21 LAB — MAGNESIUM: Magnesium: 2.1 mg/dL (ref 1.7–2.4)

## 2016-06-21 LAB — T4, FREE: Free T4: 0.96 ng/dL (ref 0.61–1.12)

## 2016-06-21 LAB — TSH: TSH: 6.509 u[IU]/mL — ABNORMAL HIGH (ref 0.350–4.500)

## 2016-06-21 LAB — GLUCOSE, CAPILLARY: Glucose-Capillary: 192 mg/dL — ABNORMAL HIGH (ref 65–99)

## 2016-06-21 SURGERY — MANIPULATION, KNEE, CLOSED
Anesthesia: General | Site: Knee | Laterality: Left

## 2016-06-21 MED ORDER — PROPOFOL 10 MG/ML IV BOLUS
INTRAVENOUS | Status: AC
  Filled 2016-06-21: qty 20

## 2016-06-21 MED ORDER — FENTANYL CITRATE (PF) 100 MCG/2ML IJ SOLN
INTRAMUSCULAR | Status: AC
  Filled 2016-06-21: qty 2

## 2016-06-21 MED ORDER — HYDROMORPHONE HCL 1 MG/ML IJ SOLN
0.2500 mg | INTRAMUSCULAR | Status: DC | PRN
  Administered 2016-06-21 (×2): 0.25 mg via INTRAVENOUS
  Administered 2016-06-21: 0.5 mg via INTRAVENOUS

## 2016-06-21 MED ORDER — KETOROLAC TROMETHAMINE 30 MG/ML IJ SOLN: 30.0000 mg | Freq: Once | INTRAMUSCULAR | Status: DC | PRN

## 2016-06-21 MED ORDER — DEXAMETHASONE SODIUM PHOSPHATE 10 MG/ML IJ SOLN: 10.0000 mg | Freq: Once | INTRAMUSCULAR | Status: DC

## 2016-06-21 MED ORDER — POVIDONE-IODINE 10 % EX SWAB: 2.0000 "application " | Freq: Once | CUTANEOUS | Status: DC

## 2016-06-21 MED ORDER — APIXABAN 5 MG PO TABS
5.0000 mg | ORAL_TABLET | Freq: Two times a day (BID) | ORAL | Status: DC
  Administered 2016-06-21: 5 mg via ORAL
  Filled 2016-06-21: qty 1

## 2016-06-21 MED ORDER — MEPERIDINE HCL 50 MG/ML IJ SOLN: 6.2500 mg | INTRAMUSCULAR | Status: DC | PRN

## 2016-06-21 MED ORDER — ACETAMINOPHEN 10 MG/ML IV SOLN
INTRAVENOUS | Status: AC
  Filled 2016-06-21: qty 100

## 2016-06-21 MED ORDER — LIDOCAINE HCL (CARDIAC) 20 MG/ML IV SOLN
INTRAVENOUS | Status: DC | PRN
  Administered 2016-06-21: 50 mg via INTRAVENOUS

## 2016-06-21 MED ORDER — ACETAMINOPHEN 10 MG/ML IV SOLN
1000.0000 mg | Freq: Once | INTRAVENOUS | Status: AC
  Administered 2016-06-21: 1000 mg via INTRAVENOUS

## 2016-06-21 MED ORDER — CHLORHEXIDINE GLUCONATE 4 % EX LIQD: 60.0000 mL | Freq: Once | CUTANEOUS | Status: DC

## 2016-06-21 MED ORDER — PROMETHAZINE HCL 25 MG/ML IJ SOLN: 6.2500 mg | INTRAMUSCULAR | Status: DC | PRN

## 2016-06-21 MED ORDER — HYDROMORPHONE HCL 1 MG/ML IJ SOLN
INTRAMUSCULAR | Status: AC
  Administered 2016-06-21: 0.5 mg via INTRAVENOUS
  Filled 2016-06-21: qty 0.5

## 2016-06-21 MED ORDER — ONDANSETRON HCL 4 MG/2ML IJ SOLN
INTRAMUSCULAR | Status: AC
Start: 2016-06-21 — End: 2016-06-21
  Filled 2016-06-21: qty 2

## 2016-06-21 MED ORDER — PROPOFOL 10 MG/ML IV BOLUS
INTRAVENOUS | Status: DC | PRN
  Administered 2016-06-21: 140 mg via INTRAVENOUS

## 2016-06-21 MED ORDER — FENTANYL CITRATE (PF) 100 MCG/2ML IJ SOLN
INTRAMUSCULAR | Status: DC | PRN
Start: 2016-06-21 — End: 2016-06-21
  Administered 2016-06-21: 50 ug via INTRAVENOUS

## 2016-06-21 MED ORDER — LIDOCAINE 2% (20 MG/ML) 5 ML SYRINGE
INTRAMUSCULAR | Status: AC
Start: 2016-06-21 — End: 2016-06-21
  Filled 2016-06-21: qty 5

## 2016-06-21 MED ORDER — HYDROMORPHONE HCL 1 MG/ML IJ SOLN
INTRAMUSCULAR | Status: AC
  Administered 2016-06-21: 0.25 mg via INTRAVENOUS
  Filled 2016-06-21: qty 0.5

## 2016-06-21 MED ORDER — METOPROLOL TARTRATE 25 MG PO TABS
25.0000 mg | ORAL_TABLET | Freq: Two times a day (BID) | ORAL | Status: DC
  Administered 2016-06-21: 25 mg via ORAL
  Filled 2016-06-21: qty 1

## 2016-06-21 MED ORDER — LACTATED RINGERS IV SOLN
INTRAVENOUS | Status: DC
  Administered 2016-06-21: 12:00:00 via INTRAVENOUS

## 2016-06-21 SURGICAL SUPPLY — 1 items: WRAP KNEE MAXI GEL POST OP (GAUZE/BANDAGES/DRESSINGS) ×3 IMPLANT

## 2016-06-21 NOTE — Progress Notes (Addendum)
ANTICOAGULATION CONSULT NOTE - Initial Consult  Pharmacy Consult for Eliquis Indication: atrial fibrillation  No Known Allergies  Patient Measurements: Height: 5\' 6"  (167.6 cm) Weight: 217 lb (98.4 kg) IBW/kg (Calculated) : 63.8  Vital Signs: Temp: 97.5 F (36.4 C) (05/14 1530) Temp Source: Oral (05/14 1145) BP: 114/44 (05/14 1530) Pulse Rate: 93 (05/14 1530)  Labs: No results for input(s): HGB, HCT, PLT, APTT, LABPROT, INR, HEPARINUNFRC, HEPRLOWMOCWT, CREATININE, CKTOTAL, CKMB, TROPONINI in the last 72 hours.  Estimated Creatinine Clearance: 71.9 mL/min (by C-G formula based on SCr of 0.96 mg/dL).   Medical History: Past Medical History:  Diagnosis Date  . 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 Barrett's esophagus with dysplasia   . Chronic lower back pain    DDD  . Closed patellar sleeve fracture of left knee   . Constipation   . Crush injury knee    bilateral  . Dysphagia    EGD with dilitation  . Enlarged prostate    sees a urologist  . GERD (gastroesophageal reflux disease)    takes Omeprazole daily  . Hard of hearing   . Hyperlipidemia    takes Lipitor and Fish Oil daily  . Insomnia    d/t pain  . Joint pain   . Joint swelling   . Left tibial fracture   . Multiple duodenal ulcers   . Nocturia   . OSA on CPAP    cpap  . Pneumonia    "about 5 years ago"  . Type II diabetes mellitus ([facility])    type 2  . Unsteady gait   . Urinary frequency   . Wears glasses     Assessment:  [age] yr male with PMH significant for arthritis, HLD, DM.  Patient presented to OR today for left knee closed manipulation.  Intra-op developed AFib and cardiology consulted.  Cardiology noted plans to begin Eliquis 5mg  BID and consulted pharmacy to dose.    Spoke with [name] Peacerew Perkins, PA who confirmed OK to begin today  Goal of Therapy:   Monitor platelets by anticoagulation protocol: Yes   Plan:   Eliquis 5mg  BID  Eliquis education  done  Maryellen PilePoindexter, [name], PharmD 06/21/2016,3:53 PM

## 2016-06-21 NOTE — Anesthesia Preprocedure Evaluation (Addendum)
Anesthesia Evaluation  Patient identified by MRN, date of birth, ID band Patient awake    Reviewed: Allergy & Precautions, NPO status , Patient's Chart, lab work & pertinent test results  Airway Mallampati: II  TM Distance: >3 FB Neck ROM: Full    Dental no notable dental hx.    Pulmonary sleep apnea , former smoker,    Pulmonary exam normal breath sounds clear to auscultation       Cardiovascular negative cardio ROS Normal cardiovascular exam Rhythm:Regular Rate:Normal     Neuro/Psych negative neurological ROS  negative psych ROS   GI/Hepatic negative GI ROS, Neg liver ROS, GERD  ,  Endo/Other  diabetes  Renal/GU negative Renal ROS  negative genitourinary   Musculoskeletal negative musculoskeletal ROS (+)   Abdominal   Peds negative pediatric ROS (+)  Hematology negative hematology ROS (+)   Anesthesia Other Findings   Reproductive/Obstetrics negative OB ROS                             Anesthesia Physical  Anesthesia Plan  ASA: II  Anesthesia Plan: General   Post-op Pain Management:    Induction: Intravenous  Airway Management Planned: Mask  Additional Equipment:   Intra-op Plan:   Post-operative Plan:   Informed Consent: I have reviewed the patients History and Physical, chart, labs and discussed the procedure including the risks, benefits and alternatives for the proposed anesthesia with the patient or authorized representative who has indicated his/her understanding and acceptance.   Dental advisory given  Plan Discussed with: CRNA  Anesthesia Plan Comments:         Anesthesia Quick Evaluation

## 2016-06-21 NOTE — Progress Notes (Signed)
Lopressor 25 mg BID and Eloquis 5mg  BID new meds per cardiology consult [facility] [phone]. Pt has had 1 dose of each med today but will need perscriptions for tomorrow.  Notified Dr Lavell Islam. [place] who inturn referred me to Dr Lequita HaltAluisio.  Spoke To Avel Peacerew Perkins, PA reguarding previous who will manage.  Updated Pt as to plan. Discharged home with wife driving.

## 2016-06-21 NOTE — Anesthesia Postprocedure Evaluation (Addendum)
Anesthesia Post Note  Patient: [name]  Procedure(s) Performed: Procedure(s) (LRB): CLOSED MANIPULATION LEFT KNEE (Left)  Patient location during evaluation: PACU Anesthesia Type: General Level of consciousness: sedated and patient cooperative Pain management: pain level controlled Vital Signs Assessment: post-procedure vital signs reviewed and stable Respiratory status: spontaneous breathing Cardiovascular status: stable Anesthetic complications: no Comments: Pt found to be in A-fib intraop. Discussed with Dr. Lequita HaltAluisio. Decision to proceed with closed manip. Immediately postop EKG obtained and cardiology called. Dr. Duke Salviaandolph at bedside soon thereafter discussing plan of care with patient. Aside from afib with RVR, perioperative course was uneventful.        Last Vitals:  Vitals:   06/21/16 1530 06/21/16 1600  BP: (!) 114/44 118/88  Pulse: 93 88  Resp: 16 16  Temp: 36.4 C     Last Pain:  Vitals:   06/21/16 1500  TempSrc:   PainSc: 2     LLE Motor Response: Purposeful movement (06/21/16 1515) LLE Sensation: No numbness;No tingling (06/21/16 1515)          [name]

## 2016-06-21 NOTE — Discharge Instructions (Signed)
Atrial Fibrillation Atrial fibrillation is a type of irregular or rapid heartbeat (arrhythmia). In atrial fibrillation, the heart quivers continuously in a chaotic pattern. This occurs when parts of the heart receive disorganized signals that make the heart unable to pump blood normally. This can increase the risk for stroke, heart failure, and other heart-related conditions. There are different types of atrial fibrillation, including:  Paroxysmal atrial fibrillation. This type starts suddenly, and it usually stops on its own shortly after it starts.  Persistent atrial fibrillation. This type often lasts longer than a week. It may stop on its own or with treatment.  Long-lasting persistent atrial fibrillation. This type lasts longer than 12 months.  Permanent atrial fibrillation. This type does not go away. Talk with your health care provider to learn about the type of atrial fibrillation that you have. What are the causes? This condition is caused by some heart-related conditions or procedures, including:  A heart attack.  Coronary artery disease.  Heart failure.  Heart valve conditions.  High blood pressure.  Inflammation of the sac that surrounds the heart (pericarditis).  Heart surgery.  Certain heart rhythm disorders, such as Wolf-Parkinson-White syndrome. Other causes include:  Pneumonia.  Obstructive sleep apnea.  Blockage of an artery in the lungs (pulmonary embolism, or PE).  Lung cancer.  Chronic lung disease.  Thyroid problems, especially if the thyroid is overactive (hyperthyroidism).  Caffeine.  Excessive alcohol use or illegal drug use.  Use of some medicines, including certain decongestants and diet pills. Sometimes, the cause cannot be found. What increases the risk? This condition is more likely to develop in:  People who are older in age.  People who smoke.  People who have diabetes mellitus.  People who are overweight (obese).  Athletes  who exercise vigorously. What are the signs or symptoms? Symptoms of this condition include:  A feeling that your heart is beating rapidly or irregularly.  A feeling of discomfort or pain in your chest.  Shortness of breath.  Sudden light-headedness or weakness.  Getting tired easily during exercise. In some cases, there are no symptoms. How is this diagnosed? Your health care provider may be able to detect atrial fibrillation when taking your pulse. If detected, this condition may be diagnosed with:  An electrocardiogram (ECG).  A Holter monitor test that records your heartbeat patterns over a 24-hour period.  Transthoracic echocardiogram (TTE) to evaluate how blood flows through your heart.  Transesophageal echocardiogram (TEE) to view more detailed images of your heart.  A stress test.  Imaging tests, such as a CT scan or chest X-ray.  Blood tests. How is this treated? The main goals of treatment are to prevent blood clots from forming and to keep your heart beating at a normal rate and rhythm. The type of treatment that you receive depends on many factors, such as your underlying medical conditions and how you feel when you are experiencing atrial fibrillation. This condition may be treated with:  Medicine to slow down the heart rate, bring the hearts rhythm back to normal, or prevent clots from forming.  Electrical cardioversion. This is a procedure that resets your hearts rhythm by delivering a controlled, low-energy shock to the heart through your skin.  Different types of ablation, such as catheter ablation, catheter ablation with pacemaker, or surgical ablation. These procedures destroy the heart tissues that send abnormal signals. When the pacemaker is used, it is placed under your skin to help your heart beat in a regular rhythm. Follow these instructions  at home:  Take over-the counter and prescription medicines only as told by your health care provider.  If  your health care provider prescribed a blood-thinning medicine (anticoagulant), take it exactly as told. Taking too much blood-thinning medicine can cause bleeding. If you do not take enough blood-thinning medicine, you will not have the protection that you need against stroke and other problems.  Do not use tobacco products, including cigarettes, chewing tobacco, and e-cigarettes. If you need help quitting, ask your health care provider.  If you have obstructive sleep apnea, manage your condition as told by your health care provider.  Do not drink alcohol.  Do not drink beverages that contain caffeine, such as coffee, soda, and tea.  Maintain a healthy weight. Do not use diet pills unless your health care provider approves. Diet pills may make heart problems worse.  Follow diet instructions as told by your health care provider.  Exercise regularly as told by your health care provider.  Keep all follow-up visits as told by your health care provider. This is important. How is this prevented?  Avoid drinking beverages that contain caffeine or alcohol.  Avoid certain medicines, especially medicines that are used for breathing problems.  Avoid certain herbs and herbal medicines, such as those that contain ephedra or ginseng.  Do not use illegal drugs, such as cocaine and amphetamines.  Do not smoke.  Manage your high blood pressure. Contact a health care provider if:  You notice a change in the rate, rhythm, or strength of your heartbeat.  You are taking an anticoagulant and you notice increased bruising.  You tire more easily when you exercise or exert yourself. Get help right away if:  You have chest pain, abdominal pain, sweating, or weakness.  You feel nauseous.  You notice blood in your vomit, bowel movement, or urine.  You have shortness of breath.  You suddenly have swollen feet and ankles.  You feel dizzy.  You have sudden weakness or numbness of the face, arm,  or leg, especially on one side of the body.  You have trouble speaking, trouble understanding, or both (aphasia).  Your face or your eyelid droops on one side. These symptoms may represent a serious problem that is an emergency. Do not wait to see if the symptoms will go away. Get medical help right away. Call your local emergency services (911 in the [place] Do not drive yourself to the hospital. This information is not intended to replace advice given to you by your health care provider. Make sure you discuss any questions you have with your health care provider. Document Released: 01/25/2005 Document Revised: 06/04/2015 Document Reviewed: 05/22/2014 Elsevier Interactive Patient Education  2017 [employer] Inc.  General Anesthesia, Adult, Care After These instructions provide you with information about caring for yourself after your procedure. Your health care provider may also give you more specific instructions. Your treatment has been planned according to current medical practices, but problems sometimes occur. Call your health care provider if you have any problems or questions after your procedure. What can I expect after the procedure? After the procedure, it is common to have:  Vomiting.  A sore throat.  Mental slowness. It is common to feel:  Nauseous.  Cold or shivery.  Sleepy.  Tired.  Sore or achy, even in parts of your body where you did not have surgery. Follow these instructions at home: For at least 24 hours after the procedure:   Do not:  Participate in activities where you could  fall or become injured.  Drive.  Use heavy machinery.  Drink alcohol.  Take sleeping pills or medicines that cause drowsiness.  Make important decisions or sign legal documents.  Take care of children on your own.  Rest. Eating and drinking   If you vomit, drink water, juice, or soup when you can drink without vomiting.  Drink enough fluid to keep your urine clear or pale  yellow.  Make sure you have little or no nausea before eating solid foods.  Follow the diet recommended by your health care provider. General instructions   Have a responsible adult stay with you until you are awake and alert.  Return to your normal activities as told by your health care provider. Ask your health care provider what activities are safe for you.  Take over-the-counter and prescription medicines only as told by your health care provider.  If you smoke, do not smoke without supervision.  Keep all follow-up visits as told by your health care provider. This is important. Contact a health care provider if:  You continue to have nausea or vomiting at home, and medicines are not helpful.  You cannot drink fluids or start eating again.  You cannot urinate after 8-12 hours.  You develop a skin rash.  You have fever.  You have increasing redness at the site of your procedure. Get help right away if:  You have difficulty breathing.  You have chest pain.  You have unexpected bleeding.  You feel that you are having a life-threatening or urgent problem. This information is not intended to replace advice given to you by your health care provider. Make sure you discuss any questions you have with your health care provider. Document Released: 05/03/2000 Document Revised: 06/30/2015 Document Reviewed: 01/09/2015 Elsevier Interactive Patient Education  2017 [employer]

## 2016-06-21 NOTE — H&P (Signed)
CC- [name] is a 76 y.o. male who presents with left knee stiffness.  HPI- . Knee Pain: Patient presents with stiffness involving the  left knee. Onset of the symptoms was several weeks ago. Inciting event: He had a revision left knee unicompart,mental replacement to a total knee arthroplasty approximately 3 months ago and has had persistent stiffness despite adequate physical therapy. He presents now for closed manipulation..  Past Medical History:  Diagnosis Date  . 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 Barrett's esophagus with dysplasia   . Chronic lower back pain    DDD  . Closed patellar sleeve fracture of left knee   . Constipation   . Crush injury knee    bilateral  . Dysphagia    EGD with dilitation  . Enlarged prostate    sees a urologist  . GERD (gastroesophageal reflux disease)    takes Omeprazole daily  . Hard of hearing   . Hyperlipidemia    takes Lipitor and Fish Oil daily  . Insomnia    d/t pain  . Joint pain   . Joint swelling   . Left tibial fracture   . Multiple duodenal ulcers   . Nocturia   . OSA on CPAP    cpap  . Pneumonia    "about 5 years ago"  . Type II diabetes mellitus ([place])    type 2  . Unsteady gait   . Urinary frequency   . Wears glasses     Past Surgical History:  Procedure Laterality Date  . ANTERIOR CERVICAL DECOMP/DISCECTOMY FUSION  10/2010  . ANTERIOR LAT LUMBAR FUSION  12/02/2011   Procedure: ANTERIOR LATERAL LUMBAR FUSION 1 LEVEL;  Surgeon: Eustace Moore, MD;  Location: [facility];  Service: Neurosurgery;  Laterality: Left;  left lumbar three-four  . BACK SURGERY    . COLONOSCOPY  04/2007  . ESOPHAGOGASTRODUODENOSCOPY    . ESOPHAGOGASTRODUODENOSCOPY  04/2007; 07/2008  . ESOPHAGOGASTRODUODENOSCOPY (EGD) WITH ESOPHAGEAL DILATION  "2-3 times"  . EXCISIONAL HEMORRHOIDECTOMY  ~ 1966  . FINGER SURGERY Right    pinky finger  . JOINT REPLACEMENT    . KNEE JOINT MANIPULATION  06/2009  .  LAPAROSCOPIC CHOLECYSTECTOMY  02/2000  . LUMBAR FUSION  03/16/2012   Dr Ronnald Ramp  . ORIF TIBIA PLATEAU Left 09/24/2015   Procedure: OPEN REDUCTION INTERNAL FIXATION (ORIF) LEFT TIBIAL PLATEAU;  Surgeon: Gaynelle Arabian, MD;  Location: [facility];  Service: Orthopedics;  Laterality: Left;  . PARTIAL KNEE ARTHROPLASTY Left 07/30/2015   Procedure: UNICOMPARTMENTAL LEFT KNEE MEDIAL;  Surgeon: Gaynelle Arabian, MD;  Location: [facility];  Service: Orthopedics;  Laterality: Left;  . POSTERIOR LUMBAR FUSION  04/2015   rods inserted L2  . REFRACTIVE SURGERY Right 1980s  . SHOULDER OPEN ROTATOR CUFF REPAIR Bilateral 1990s  . TENNIS ELBOW RELEASE/NIRSCHEL PROCEDURE Right 1980s  . TOTAL KNEE ARTHROPLASTY Right 03/2009  . TOTAL KNEE ARTHROPLASTY WITH REVISION COMPONENTS Left 03/03/2016   Procedure: REVISION LEFT KNEE UNICOMPARTMENTAL ARTHROPLASTY TO TOTAL KNEE ARTHROPLASTY;  Surgeon: Gaynelle Arabian, MD;  Location: [facility];  Service: Orthopedics;  Laterality: Left;  requests 2hrs; Adductor Block    Prior to Admission medications   Medication Sig Start Date End Date Taking? Authorizing Provider  aspirin EC 81 MG tablet Take 81 mg by mouth daily.   Yes [provider]  atorvastatin (LIPITOR) 80 MG tablet Take 40-80 mg by mouth See admin instructions. Takes 80 mg on Monday, Wednesday, Fridays  and 40 mg all other days of the week   Yes [provider]  BIOTIN PO Take 450 mg by mouth daily.   Yes [provider]  Cholecalciferol (VITAMIN D) 2000 units tablet Take 2,000 Units by mouth daily.    Yes [provider]  docusate sodium (COLACE) 100 MG capsule Take 200 mg by mouth at bedtime.    Yes [provider]  doxycycline (VIBRA-TABS) 100 MG tablet Take 200 mg by mouth daily.  12/08/15  Yes [provider]  finasteride (PROSCAR) 5 MG tablet Take 5 mg by mouth at bedtime.    Yes [provider]  gabapentin (NEURONTIN) 300 MG capsule Take 300 mg by mouth 3 (three) times  daily.   Yes [provider]  methocarbamol (ROBAXIN) 500 MG tablet Take 1 tablet (500 mg total) by mouth every 6 (six) hours as needed for muscle spasms. 03/05/16  Yes Perkins, Alexzandrew L, PA-C  Misc Natural Products (GLUCOSAMINE CHOND COMPLEX/MSM PO) Take 1 tablet by mouth daily.   Yes [provider]  multivitamin-iron-minerals-folic acid (CENTRUM) chewable tablet Chew 1 tablet by mouth daily.   Yes [provider]  Omega-3 Fatty Acids (FISH OIL) 1200 MG CAPS Take 1,200 mg by mouth daily.   Yes [provider]  omeprazole (PRILOSEC) 20 MG capsule Take 20 mg by mouth daily.   Yes [provider]  oxyCODONE (OXY IR/ROXICODONE) 5 MG immediate release tablet Take 5 mg by mouth every 4 (four) hours as needed for severe pain.   Yes [provider]  pioglitazone-metformin (ACTOPLUS MET) 15-500 MG per tablet Take 1 tablet by mouth daily.    Yes [provider]  propranolol (INDERAL) 10 MG tablet Take 10 mg by mouth 2 (two) times daily.   Yes [provider]  Saw Palmetto, Serenoa repens, (SAW PALMETTO PO) Take 1 capsule by mouth daily.    Yes [provider]  traMADol (ULTRAM) 50 MG tablet Take 1-2 tablets (50-100 mg total) by mouth every 6 (six) hours as needed (mild pain). 03/05/16  Yes Perkins, Alexzandrew L, PA-C  vitamin B-12 (CYANOCOBALAMIN) 1000 MCG tablet Take 1,000 mcg by mouth daily.   Yes [provider]   Left knee exam antalgic gait, no effusion, reduced range of motion (20-90), collateral ligaments intact  Physical Examination: General appearance - alert, well appearing, and in no distress Mental status - alert, oriented to person, place, and time Chest - clear to auscultation, no wheezes, rales or rhonchi, symmetric air entry Heart - normal rate, regular rhythm, normal S1, S2, no murmurs, rubs, clicks or gallops Abdomen - soft, nontender, nondistended, no masses or organomegaly Neurological -  alert, oriented, normal speech, no focal findings or movement disorder noted   Asessment/Plan--- Left knee arthrofibrosis- - Plan left knee closed manipulation. Procedure risks and potential comps discussed with patient who elects to proceed. Goals are decreased pain and increased function with a high likelihood of achieving both

## 2016-06-21 NOTE — Progress Notes (Signed)
Per Dr. Leonides Sakeandolph's request, I have sent a message to our scheduler requesting an outpatient echocardiogram and follow-up appointment within 1 week in afib clinic, and our office will call the patient with this information. Dayna Dunn PA-C

## 2016-06-21 NOTE — Interval H&P Note (Signed)
History and Physical Interval Note:  06/21/2016 1:03 PM  [name]  has presented today for surgery, with the diagnosis of Arthrofibrosis Left knee  The various methods of treatment have been discussed with the patient and family. After consideration of risks, benefits and other options for treatment, the patient has consented to  Procedure(s): CLOSED MANIPULATION LEFT KNEE (Left) as a surgical intervention .  The patient's history has been reviewed, patient examined, no change in status, stable for surgery.  I have reviewed the patient's chart and labs.  Questions were answered to the patient's satisfaction.     [name],[name]

## 2016-06-21 NOTE — Transfer of Care (Signed)
Immediate Anesthesia Transfer of Care Note  Patient: [name]  Procedure(s) Performed: Procedure(s): CLOSED MANIPULATION LEFT KNEE (Left)  Patient Location: PACU  Anesthesia Type:General  Level of Consciousness: awake, alert  and oriented  Airway & Oxygen Therapy: Patient Spontanous Breathing and Patient connected to face mask oxygen  Post-op Assessment: Report given to RN and Post -op Vital signs reviewed and stable  Post vital signs: Reviewed and stable  Last Vitals:  Vitals:   06/21/16 1145 06/21/16 1330  BP: 119/85 (P) 108/78  Pulse: (!) 52   Resp: 18   Temp: 36.8 C     Last Pain:  Vitals:   06/21/16 1145  TempSrc: Oral      Patients Stated Pain Goal: 3 (06/21/16 1159)  Complications: No apparent anesthesia complications

## 2016-06-21 NOTE — Op Note (Signed)
  OPERATIVE REPORT   PREOPERATIVE DIAGNOSIS: Arthrofibrosis, Left  knee.   POSTOPERATIVE DIAGNOSIS: Arthrofibrosis, Left knee.   PROCEDURE:  Left  knee closed manipulation.   SURGEON: Ollen GrossFrank Jeanny Rymer, MD   ASSISTANT: None.   ANESTHESIA: General.   COMPLICATIONS: None.   CONDITION: Stable to Recovery.   Pre-manipulation range of motion is 20-95.  Post-manipulation range of  Motion is 10-125  PROCEDURE IN DETAIL: After successful administration of general  anesthetic, exam under anesthesia was performed showing range of motion  20-95 degrees. I then placed my chest against the proximal tibia,  flexing the knee with audible lysis of adhesions. I was easily able to  get the knee flexed to 125  degrees. I then put the knee back in extension and with some  patellar manipulation and gentle pressure got within 10 degrees of full  Extension.The patient was subsequently awakened and transported to Recovery in  stable condition.

## 2016-06-21 NOTE — Consult Note (Signed)
Dyer ID: [name] MRN: [id number] DOB/AGE: 1940/03/08 76 y.o.  Admit date: 06/21/2016 Primary Physician [name], MD Primary Cardiologist new Oval Linsey)   Chief Complaint  Atrial fibrillation  HPI: [name] is a 76M with diabetes, OSA on CPAP and hyperlipidemia who developed post-operative atrial fibrillation.  He underwent closed manipulation of the L knee and was noted to be in atrial fibrillation while in the PACU.  He reports increased dyspnea on exertion for the last two weeks but denies chest pain or pressure.  He also denies lower extremity edema, orthopnea or PND.  He wears his CPAP faithfully.  [name] hasn't been getting much exercise due to knee pain.  He denies palpitations, lightheadedness or dizziness.  He was noted to be in atrial fibrillation post-operatively and cardiology was consulted by Dr. Lissa Hoard.  He is currently without complaint and eager to go home.    Review of Systems:  A 12 point review of systems was obtained and was negative with exceptions noted in the HPI.    Past Medical History:  Diagnosis Date  . 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 Barrett's esophagus with dysplasia   . Chronic lower back pain    DDD  . Closed patellar sleeve fracture of left knee   . Constipation   . Crush injury knee    bilateral  . Dysphagia    EGD with dilitation  . Enlarged prostate    sees a urologist  . GERD (gastroesophageal reflux disease)    takes Omeprazole daily  . Hard of hearing   . Hyperlipidemia    takes Lipitor and Fish Oil daily  . Insomnia    d/t pain  . Joint pain   . Joint swelling   . Left tibial fracture   . Multiple duodenal ulcers   . Nocturia   . OSA on CPAP    cpap  . Pneumonia    "about 5 years ago"  . Type II diabetes mellitus ([place])    type 2  . Unsteady gait   . Urinary frequency   . Wears glasses     Medications Prior to Admission  Medication Sig Dispense Refill    . aspirin EC 81 MG tablet Take 81 mg by mouth daily.    [name] Kitchen atorvastatin (LIPITOR) 80 MG tablet Take 40-80 mg by mouth See admin instructions. Takes 80 mg on Monday, Wednesday, Fridays and 40 mg all other days of the week    . BIOTIN PO Take 450 mg by mouth daily.    . Cholecalciferol (VITAMIN D) 2000 units tablet Take 2,000 Units by mouth daily.     [name] Kitchen docusate sodium (COLACE) 100 MG capsule Take 200 mg by mouth at bedtime.     [name] Kitchen doxycycline (VIBRA-TABS) 100 MG tablet Take 200 mg by mouth daily.     . finasteride (PROSCAR) 5 MG tablet Take 5 mg by mouth at bedtime.     . gabapentin (NEURONTIN) 300 MG capsule Take 300 mg by mouth 3 (three) times daily.    . methocarbamol (ROBAXIN) 500 MG tablet Take 1 tablet (500 mg total) by mouth every 6 (six) hours as needed for muscle spasms. 80 tablet 0  . Misc Natural Products (GLUCOSAMINE CHOND COMPLEX/MSM PO) Take 1 tablet by mouth daily.    . multivitamin-iron-minerals-folic acid (CENTRUM) chewable tablet Chew 1 tablet by mouth daily.    . Omega-3 Fatty Acids (FISH OIL) 1200 MG CAPS  Take 1,200 mg by mouth daily.    [name] Kitchen omeprazole (PRILOSEC) 20 MG capsule Take 20 mg by mouth daily.    [name] Kitchen oxyCODONE (OXY IR/ROXICODONE) 5 MG immediate release tablet Take 5 mg by mouth every 4 (four) hours as needed for severe pain.    . pioglitazone-metformin (ACTOPLUS MET) 15-500 MG per tablet Take 1 tablet by mouth daily.     . propranolol (INDERAL) 10 MG tablet Take 10 mg by mouth 2 (two) times daily.    . Saw Palmetto, Serenoa repens, (SAW PALMETTO PO) Take 1 capsule by mouth daily.     . traMADol (ULTRAM) 50 MG tablet Take 1-2 tablets (50-100 mg total) by mouth every 6 (six) hours as needed (mild pain). 56 tablet 0  . vitamin B-12 (CYANOCOBALAMIN) 1000 MCG tablet Take 1,000 mcg by mouth daily.       . chlorhexidine  60 mL Topical Once  . dexamethasone  10 mg Intravenous Once    Infusions: . lactated ringers 75 mL/hr at 06/21/16 1207    No Known Allergies  Social  History   Social History  . Marital status: Married    Spouse name: N/A  . Number of children: N/A  . Years of education: N/A   Occupational History  . Not on file.   Social History Main Topics  . Smoking status: Former Smoker    Packs/day: 1.50    Years: 15.00    Types: Cigarettes    Quit date: 03/16/1985  . Smokeless tobacco: Never Used  . Alcohol use Yes     Comment: beer occasionally  . Drug use: No  . Sexual activity: Not Currently   Other Topics Concern  . Not on file   Social History Narrative  . No narrative on file    Family History  Problem Relation Age of Onset  . Diabetes Brother     PHYSICAL EXAM: Vitals:   06/21/16 1345 06/21/16 1400  BP: 112/75 111/68  Pulse: 85 91  Resp: 12 17  Temp:       Intake/Output Summary (Last 24 hours) at 06/21/16 1419 Last data filed at 06/21/16 1332  Gross per 24 hour  Intake              300 ml  Output                0 ml  Net              300 ml    General:  Well appearing.  No respiratory difficulty HEENT: normal Neck: Supple. no JVD. Carotids 2+ bilat; no bruits. No lymphadenopathy or thryomegaly appreciated. Cor: Irregularly irregular.  No rubs, gallops or murmurs. Lungs: clear to ausculation bilaterally.  No crackles, rhonchi or wheezes Abdomen: soft, nontender, nondistended. No hepatosplenomegaly. No bruits or masses. Good bowel sounds. Extremities: no cyanosis, clubbing, rash, edema Neuro: alert & oriented x 3, cranial nerves grossly intact. moves all 4 extremities w/o difficulty. Affect pleasant.  Results for orders placed or performed during the hospital encounter of 06/21/16 (from the past 24 hour(s))  Glucose, capillary     Status: Abnormal   Collection Time: 06/21/16 11:43 AM  Result Value Ref Range   Glucose-Capillary 192 (H) 65 - 99 mg/dL   No results found.   ECG: Atrial fibrillation.  Rate 108 bpm.  Prior inferior infarct.    ASSESSMENT/PLAN/:  # Post-operative atrial fibrillation:  [name] developed atrial fibrillation post-operatively.  He does report exertional dyspnea for the last  two weeks. It is possible that he had paroxysmal atrial fibrillation pre-operatively.  Rates are currently in the 90s-120s.  We will start metoprolol succinate 25 mg bid and stop propranolol.  Start Eliquis 5 mg bid and we will get him in atrial fibrillation [name] week.  We will arrange for outpatient echo and check TSH, free T4, magnesium today.  He is asymptomatic and OK for discharge home today.   [name] patients CHA2DS2-VASc Score and unadjusted Ischemic Stroke Rate (% per year) is equal to 3.2 % stroke rate/year from a score of 3  Above score calculated as 1 point each if present [CHF, HTN, DM, Vascular=MI/PAD/Aortic Plaque, Age if 65-74, or Male] Above score calculated as 2 points each if present [Age > 75, or Stroke/TIA/TE]     Signed: Quetzally Callas C. Oval Linsey, MD, [facility]  06/21/2016, 2:19 PM

## 2016-06-21 NOTE — Progress Notes (Signed)
Received call from pharmacy and later the recovery room nurse informing me of the patient's condition.  Cardiology was consulted by Anesthesia for atrial fib.  He was placed on Lopressor and Eliquis.  RX's were not provided for the patient so a two week supply of both medications were [facility] [address] and a message containing instructions for both medications was left for the pharmacist.  Further refills for both medications will then come from the cardiologist office going forward. Patient was then discharged home.  Avel Peacerew Rayland Hamed, PA-C

## 2016-06-22 ENCOUNTER — Encounter (HOSPITAL_COMMUNITY): Payer: Self-pay | Admitting: Orthopedic Surgery

## 2016-06-22 ENCOUNTER — Telehealth: Payer: Self-pay | Admitting: Cardiovascular Disease

## 2016-06-22 ENCOUNTER — Other Ambulatory Visit (HOSPITAL_COMMUNITY): Payer: Self-pay | Admitting: *Deleted

## 2016-06-22 MED ORDER — METOPROLOL SUCCINATE ER 25 MG PO TB24: 25.0000 mg | ORAL_TABLET | Freq: Two times a day (BID) | ORAL | 3 refills | Status: DC

## 2016-06-22 MED ORDER — APIXABAN 5 MG PO TABS: 5.0000 mg | ORAL_TABLET | Freq: Two times a day (BID) | ORAL | 3 refills | Status: DC

## 2016-06-22 NOTE — Telephone Encounter (Signed)
Lm2cb-for Elouise Munroerisha Toole orthopedic for further if this is for physical therapy or surgical clearance

## 2016-06-22 NOTE — Telephone Encounter (Signed)
New message    Needs verbal clearance for PT . Ok to leave vm message

## 2016-06-23 NOTE — Telephone Encounter (Signed)
Left detailed message on voicemail for [name]

## 2016-06-23 NOTE — Telephone Encounter (Signed)
OK to start physical therapy as long as he feels Jae DireOK  Emaline Karnes PA-C 06/23/2016 1:38 PM

## 2016-06-23 NOTE — Telephone Encounter (Signed)
Left message to call back  

## 2016-06-23 NOTE — Telephone Encounter (Signed)
[name] needs clearance for him to participate in PT given the fact he is a new onset Afib   Will forward to Dr Duke Salviaandolph and Eda PaschalLuke K PA

## 2016-06-23 NOTE — Telephone Encounter (Signed)
Follow Up> Returning call from yesterday.This was concerning physical therapy. She said she would give you more details when you called back.

## 2016-06-29 ENCOUNTER — Ambulatory Visit (HOSPITAL_COMMUNITY)
Admission: RE | Admit: 2016-06-29 | Discharge: 2016-06-29 | Disposition: A | Discharge status: Home / Self Care | Payer: Medicare Other | Source: Ambulatory Visit | Attending: Nurse Practitioner | Admitting: Nurse Practitioner

## 2016-06-29 ENCOUNTER — Encounter (HOSPITAL_COMMUNITY): Payer: Self-pay | Admitting: Nurse Practitioner

## 2016-06-29 VITALS — BP 138/84 | HR 60 | Ht 66.0 in | Wt 220.0 lb

## 2016-06-29 DIAGNOSIS — Z87891 Personal history of nicotine dependence: Secondary | ICD-10-CM | POA: Insufficient documentation

## 2016-06-29 DIAGNOSIS — N400 Enlarged prostate without lower urinary tract symptoms: Secondary | ICD-10-CM | POA: Diagnosis not present

## 2016-06-29 DIAGNOSIS — Z833 Family history of diabetes mellitus: Secondary | ICD-10-CM | POA: Diagnosis not present

## 2016-06-29 DIAGNOSIS — E119 Type 2 diabetes mellitus without complications: Secondary | ICD-10-CM | POA: Diagnosis not present

## 2016-06-29 DIAGNOSIS — I480 Paroxysmal atrial fibrillation: Secondary | ICD-10-CM

## 2016-06-29 DIAGNOSIS — Z7901 Long term (current) use of anticoagulants: Secondary | ICD-10-CM | POA: Diagnosis not present

## 2016-06-29 DIAGNOSIS — G4733 Obstructive sleep apnea (adult) (pediatric): Secondary | ICD-10-CM | POA: Diagnosis not present

## 2016-06-29 DIAGNOSIS — Z7984 Long term (current) use of oral hypoglycemic drugs: Secondary | ICD-10-CM | POA: Insufficient documentation

## 2016-06-29 DIAGNOSIS — M1990 Unspecified osteoarthritis, unspecified site: Secondary | ICD-10-CM | POA: Insufficient documentation

## 2016-06-29 DIAGNOSIS — G4700 Insomnia, unspecified: Secondary | ICD-10-CM | POA: Diagnosis not present

## 2016-06-29 DIAGNOSIS — R2681 Unsteadiness on feet: Secondary | ICD-10-CM | POA: Diagnosis not present

## 2016-06-29 DIAGNOSIS — I4891 Unspecified atrial fibrillation: Secondary | ICD-10-CM | POA: Diagnosis present

## 2016-06-29 DIAGNOSIS — K219 Gastro-esophageal reflux disease without esophagitis: Secondary | ICD-10-CM | POA: Insufficient documentation

## 2016-06-29 DIAGNOSIS — E785 Hyperlipidemia, unspecified: Secondary | ICD-10-CM | POA: Diagnosis not present

## 2016-06-29 DIAGNOSIS — Z96653 Presence of artificial knee joint, bilateral: Secondary | ICD-10-CM | POA: Diagnosis not present

## 2016-06-29 DIAGNOSIS — Z981 Arthrodesis status: Secondary | ICD-10-CM | POA: Insufficient documentation

## 2016-06-29 DIAGNOSIS — Z79899 Other long term (current) drug therapy: Secondary | ICD-10-CM | POA: Insufficient documentation

## 2016-06-29 MED ORDER — APIXABAN 5 MG PO TABS: 5.0000 mg | ORAL_TABLET | Freq: Two times a day (BID) | ORAL | 0 refills | Status: DC

## 2016-06-30 NOTE — Progress Notes (Signed)
Primary Care Physician: [name], MD Referring Physician: Dr. Truman Hayward [name] is a 76 y.o. male with a h/o DM, OSA treated with cpap, that is in the afib clinic for evaluation. He was found to be in new onset afib at time of knee surgery. He was seen in consult by Dr. Oval Linsey and was started on metoprolol and apixaban.  As far as pt know this is a new diagnosis. He is asymptomatic. Echo is pending.He is in NSR this am.  He denies any significant alcohol,caffeine. No tobacco and does wear cpap nightly.Labs drawn 5/14 show mildly elevated TSH but T4 was in normal range.  Today, he denies symptoms of palpitations, chest pain, shortness of breath, orthopnea, PND, lower extremity edema, dizziness, presyncope, syncope, or neurologic sequela. The patient is tolerating medications without difficulties and is otherwise without complaint today.   Past Medical History:  Diagnosis Date  . 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 Barrett's esophagus with dysplasia   . Chronic lower back pain    DDD  . Closed patellar sleeve fracture of left knee   . Constipation   . Crush injury knee    bilateral  . Dysphagia    EGD with dilitation  . Enlarged prostate    sees a urologist  . GERD (gastroesophageal reflux disease)    takes Omeprazole daily  . Hard of hearing   . Hyperlipidemia    takes Lipitor and Fish Oil daily  . Insomnia    d/t pain  . Joint pain   . Joint swelling   . Left tibial fracture   . Multiple duodenal ulcers   . Nocturia   . OSA on CPAP    cpap  . Pneumonia    "about 5 years ago"  . Type II diabetes mellitus ([place])    type 2  . Unsteady gait   . Urinary frequency   . Wears glasses    Past Surgical History:  Procedure Laterality Date  . ANTERIOR CERVICAL DECOMP/DISCECTOMY FUSION  10/2010  . ANTERIOR LAT LUMBAR FUSION  12/02/2011   Procedure: ANTERIOR LATERAL LUMBAR FUSION 1 LEVEL;  Surgeon: Eustace Moore, MD;   Location: [facility];  Service: Neurosurgery;  Laterality: Left;  left lumbar three-four  . BACK SURGERY    . COLONOSCOPY  04/2007  . ESOPHAGOGASTRODUODENOSCOPY    . ESOPHAGOGASTRODUODENOSCOPY  04/2007; 07/2008  . ESOPHAGOGASTRODUODENOSCOPY (EGD) WITH ESOPHAGEAL DILATION  "2-3 times"  . EXCISIONAL HEMORRHOIDECTOMY  ~ 1966  . FINGER SURGERY Right    pinky finger  . JOINT REPLACEMENT    . KNEE CLOSED REDUCTION Left 06/21/2016   Procedure: CLOSED MANIPULATION LEFT KNEE;  Surgeon: Gaynelle Arabian, MD;  Location: [facility];  Service: Orthopedics;  Laterality: Left;  . KNEE JOINT MANIPULATION  06/2009  . LAPAROSCOPIC CHOLECYSTECTOMY  02/2000  . LUMBAR FUSION  03/16/2012   Dr Ronnald Ramp  . ORIF TIBIA PLATEAU Left 09/24/2015   Procedure: OPEN REDUCTION INTERNAL FIXATION (ORIF) LEFT TIBIAL PLATEAU;  Surgeon: Gaynelle Arabian, MD;  Location: [facility];  Service: Orthopedics;  Laterality: Left;  . PARTIAL KNEE ARTHROPLASTY Left 07/30/2015   Procedure: UNICOMPARTMENTAL LEFT KNEE MEDIAL;  Surgeon: Gaynelle Arabian, MD;  Location: [facility];  Service: Orthopedics;  Laterality: Left;  . POSTERIOR LUMBAR FUSION  04/2015   rods inserted L2  . REFRACTIVE SURGERY Right 1980s  . SHOULDER OPEN ROTATOR CUFF REPAIR Bilateral 1990s  . TENNIS ELBOW RELEASE/NIRSCHEL PROCEDURE Right  1980s  . TOTAL KNEE ARTHROPLASTY Right 03/2009  . TOTAL KNEE ARTHROPLASTY WITH REVISION COMPONENTS Left 03/03/2016   Procedure: REVISION LEFT KNEE UNICOMPARTMENTAL ARTHROPLASTY TO TOTAL KNEE ARTHROPLASTY;  Surgeon: Gaynelle Arabian, MD;  Location: [facility];  Service: Orthopedics;  Laterality: Left;  requests 2hrs; Adductor Block    Current Outpatient Prescriptions  Medication Sig Dispense Refill  . apixaban (ELIQUIS) 5 MG TABS tablet Take 1 tablet (5 mg total) by mouth 2 (two) times daily. 60 tablet 0  . atorvastatin (LIPITOR) 80 MG tablet Take 40-80 mg by mouth See admin instructions. Takes 80 mg on Monday, Wednesday, Fridays and 40 mg all other days of the week      . BIOTIN PO Take 450 mg by mouth daily.    . Cholecalciferol (VITAMIN D) 2000 units tablet Take 2,000 Units by mouth daily.     [name] Kitchen docusate sodium (COLACE) 100 MG capsule Take 200 mg by mouth at bedtime.     [name] Kitchen doxycycline (VIBRA-TABS) 100 MG tablet Take 200 mg by mouth daily.     . finasteride (PROSCAR) 5 MG tablet Take 5 mg by mouth at bedtime.     . gabapentin (NEURONTIN) 300 MG capsule Take 300 mg by mouth 3 (three) times daily.    . methocarbamol (ROBAXIN) 500 MG tablet Take 1 tablet (500 mg total) by mouth every 6 (six) hours as needed for muscle spasms. 80 tablet 0  . metoprolol succinate (TOPROL XL) 25 MG 24 hr tablet Take 1 tablet (25 mg total) by mouth 2 (two) times daily. 60 tablet 3  . Misc Natural Products (GLUCOSAMINE CHOND COMPLEX/MSM PO) Take 1 tablet by mouth daily.    . multivitamin-iron-minerals-folic acid (CENTRUM) chewable tablet Chew 1 tablet by mouth daily.    . Omega-3 Fatty Acids (FISH OIL) 1200 MG CAPS Take 1,200 mg by mouth daily.    [name] Kitchen omeprazole (PRILOSEC) 20 MG capsule Take 20 mg by mouth daily.    [name] Kitchen oxyCODONE (OXY IR/ROXICODONE) 5 MG immediate release tablet Take 5 mg by mouth every 4 (four) hours as needed for severe pain.    . pioglitazone-metformin (ACTOPLUS MET) 15-500 MG per tablet Take 1 tablet by mouth daily.     . Saw Palmetto, Serenoa repens, (SAW PALMETTO PO) Take 1 capsule by mouth daily.     . traMADol (ULTRAM) 50 MG tablet Take 1-2 tablets (50-100 mg total) by mouth every 6 (six) hours as needed (mild pain). 56 tablet 0  . vitamin B-12 (CYANOCOBALAMIN) 1000 MCG tablet Take 1,000 mcg by mouth daily.     No current facility-administered medications for this encounter.     No Known Allergies  Social History   Social History  . Marital status: Married    Spouse name: N/A  . Number of children: N/A  . Years of education: N/A   Occupational History  . Not on file.   Social History Main Topics  . Smoking status: Former Smoker    Packs/day: 1.50     Years: 15.00    Types: Cigarettes    Quit date: 03/16/1985  . Smokeless tobacco: Never Used  . Alcohol use Yes     Comment: beer occasionally  . Drug use: No  . Sexual activity: Not Currently   Other Topics Concern  . Not on file   Social History Narrative  . No narrative on file    Family History  Problem Relation Age of Onset  . Diabetes Brother     ROS- All systems  are reviewed and negative except as per the HPI above  Physical Exam: Vitals:   06/29/16 1001  BP: 138/84  Pulse: 60  Weight: 220 lb (99.8 kg)  Height: 5' 6"  (1.676 m)   Wt Readings from Last 3 Encounters:  06/29/16 220 lb (99.8 kg)  06/21/16 217 lb (98.4 kg)  06/16/16 217 lb (98.4 kg)    Labs: Lab Results  Component Value Date   NA 140 06/16/2016   K 4.4 06/16/2016   CL 107 06/16/2016   CO2 26 06/16/2016   GLUCOSE 217 (H) 06/16/2016   BUN 16 06/16/2016   CREATININE 0.96 06/16/2016   CALCIUM 9.2 06/16/2016   MG 2.1 06/21/2016   Lab Results  Component Value Date   INR 0.97 02/24/2016   No results found for: CHOL, HDL, LDLCALC, TRIG   GEN- The patient is well appearing, alert and oriented x 3 today.   Head- normocephalic, atraumatic Eyes-  Sclera clear, conjunctiva pink Ears- hearing intact Oropharynx- clear Neck- supple, no JVP Lymph- no cervical lymphadenopathy Lungs- Clear to ausculation bilaterally, normal work of breathing Heart- Regular rate and rhythm, no murmurs, rubs or gallops, PMI not laterally displaced GI- soft, NT, ND, + BS Extremities- no clubbing, cyanosis, or edema MS- no significant deformity or atrophy Skin- no rash or lesion Psych- euthymic mood, full affect Neuro- strength and sensation are intact  EKG- NSR at 60 bpm, pr int 208 ms, qrs int 82 ms, qtc 440 ms Epic records reviewed    Assessment and Plan: 1. New onset paroxysmal afib General education re afib discussed He will continue BB without change Continue apixaban with a chadsvasc score of  3 Bleeding precautions discussed Denies bleeding history Echo pending Continue to use cpap Regular exercise encouraged 5-10% weight loss receommended May need thyroid studies reevaluated in the future, with recent elevated TSH but normal T4  F/u with Dr. Oval Linsey in 3 months  afib clinic as needed

## 2016-07-02 ENCOUNTER — Ambulatory Visit (HOSPITAL_COMMUNITY): Payer: Medicare Other | Attending: Cardiology

## 2016-07-02 ENCOUNTER — Other Ambulatory Visit: Payer: Self-pay

## 2016-07-02 DIAGNOSIS — I480 Paroxysmal atrial fibrillation: Secondary | ICD-10-CM | POA: Insufficient documentation

## 2016-07-02 DIAGNOSIS — Z87891 Personal history of nicotine dependence: Secondary | ICD-10-CM | POA: Insufficient documentation

## 2016-07-02 DIAGNOSIS — G4733 Obstructive sleep apnea (adult) (pediatric): Secondary | ICD-10-CM | POA: Insufficient documentation

## 2016-07-02 DIAGNOSIS — E669 Obesity, unspecified: Secondary | ICD-10-CM | POA: Diagnosis not present

## 2016-07-02 DIAGNOSIS — I081 Rheumatic disorders of both mitral and tricuspid valves: Secondary | ICD-10-CM | POA: Diagnosis not present

## 2016-07-02 DIAGNOSIS — E119 Type 2 diabetes mellitus without complications: Secondary | ICD-10-CM | POA: Diagnosis not present

## 2016-07-02 DIAGNOSIS — E785 Hyperlipidemia, unspecified: Secondary | ICD-10-CM | POA: Insufficient documentation

## 2016-07-02 DIAGNOSIS — Z6835 Body mass index (BMI) 35.0-35.9, adult: Secondary | ICD-10-CM | POA: Insufficient documentation

## 2016-07-02 DIAGNOSIS — I371 Nonrheumatic pulmonary valve insufficiency: Secondary | ICD-10-CM | POA: Insufficient documentation

## 2016-07-12 NOTE — Addendum Note (Signed)
Addendum  created 07/12/16 1020 by Kimberlin Scheel, MD   Sign clinical note    

## 2016-07-26 ENCOUNTER — Other Ambulatory Visit (HOSPITAL_COMMUNITY): Payer: Self-pay | Admitting: Nurse Practitioner

## 2016-08-13 ENCOUNTER — Telehealth: Payer: Self-pay | Admitting: *Deleted

## 2016-08-13 NOTE — Telephone Encounter (Signed)
Patient came by office and left message with front desk. [facility] and needs an injection in his neck. He needs to hold Eliquis 3 days prior. Will forward to Dr Duke Salviaandolph for review

## 2016-08-14 NOTE — Telephone Encounter (Signed)
OK to hold Eliquis for 3 days prior to injection. 

## 2016-08-16 NOTE — Telephone Encounter (Signed)
Sent to Dr Ethelene Halamos via Epic  Patient aware

## 2016-08-19 ENCOUNTER — Other Ambulatory Visit: Payer: Self-pay | Admitting: Physical Medicine and Rehabilitation

## 2016-08-19 DIAGNOSIS — M961 Postlaminectomy syndrome, not elsewhere classified: Secondary | ICD-10-CM

## 2016-08-20 NOTE — Addendum Note (Signed)
Addendum  created 08/20/16 1027 by Lewie LoronGermeroth, Malva Diesing, MD   Sign clinical note

## 2016-08-26 ENCOUNTER — Encounter: Payer: Self-pay | Admitting: *Deleted

## 2016-08-26 NOTE — Telephone Encounter (Signed)
This encounter was created in error - please disregard.

## 2016-09-01 ENCOUNTER — Telehealth: Payer: Self-pay | Admitting: *Deleted

## 2016-09-01 MED ORDER — APIXABAN 5 MG PO TABS
5.0000 mg | ORAL_TABLET | Freq: Two times a day (BID) | ORAL | 1 refills | Status: DC
Start: 2016-09-01 — End: 2017-04-22

## 2016-09-01 MED ORDER — METOPROLOL SUCCINATE ER 25 MG PO TB24: 25.0000 mg | ORAL_TABLET | Freq: Two times a day (BID) | ORAL | 1 refills | Status: DC

## 2016-09-01 NOTE — Addendum Note (Signed)
Addended by: Pearletha FurlODRIGUEZ-GUZMAN, Mehar Kirkwood on: 09/01/2016 05:26 PM   Modules accepted: Orders

## 2016-09-01 NOTE — Telephone Encounter (Signed)
Patient came in as a walk in for a refill on Metoprolol 25 mg bid and Eliquis 5 mg. The patient has a follow up appointment with Dr. Duke Salviaandolph on 8/29.   Metoprolol 25 mg has been called in.   The eliquis request will be sent to pharmd.

## 2016-09-02 ENCOUNTER — Ambulatory Visit
Admission: RE | Admit: 2016-09-02 | Discharge: 2016-09-02 | Disposition: A | Discharge status: Home / Self Care | Payer: Medicare Other | Source: Ambulatory Visit | Attending: Physical Medicine and Rehabilitation | Admitting: Physical Medicine and Rehabilitation

## 2016-09-02 DIAGNOSIS — M961 Postlaminectomy syndrome, not elsewhere classified: Secondary | ICD-10-CM

## 2016-09-02 MED ORDER — TRIAMCINOLONE ACETONIDE 40 MG/ML IJ SUSP (RADIOLOGY)
60.0000 mg | Freq: Once | INTRAMUSCULAR | Status: AC
  Administered 2016-09-02: 60 mg via EPIDURAL

## 2016-09-02 MED ORDER — IOPAMIDOL (ISOVUE-M 300) INJECTION 61%
1.0000 mL | Freq: Once | INTRAMUSCULAR | Status: AC | PRN
  Administered 2016-09-02: 1 mL via EPIDURAL

## 2016-09-02 NOTE — Discharge Instructions (Signed)

## 2016-09-07 ENCOUNTER — Telehealth: Payer: Self-pay | Admitting: Cardiovascular Disease

## 2016-10-06 ENCOUNTER — Encounter: Payer: Self-pay | Admitting: Cardiovascular Disease

## 2016-10-06 ENCOUNTER — Ambulatory Visit (INDEPENDENT_AMBULATORY_CARE_PROVIDER_SITE_OTHER): Payer: Medicare Other | Admitting: Cardiovascular Disease

## 2016-10-06 VITALS — BP 109/60 | HR 61 | Ht 67.0 in | Wt 213.2 lb

## 2016-10-06 DIAGNOSIS — I480 Paroxysmal atrial fibrillation: Secondary | ICD-10-CM | POA: Diagnosis not present

## 2016-10-06 DIAGNOSIS — R0602 Shortness of breath: Secondary | ICD-10-CM | POA: Diagnosis not present

## 2016-10-06 LAB — T4, FREE: Free T4: 1.19 ng/dL (ref 0.82–1.77)

## 2016-10-06 LAB — TSH: TSH: 4.71 u[IU]/mL — ABNORMAL HIGH (ref 0.450–4.500)
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
## 2016-10-06 NOTE — Patient Instructions (Signed)
Medication Instructions:  Your physician recommends that you continue on your current medications as directed. Please refer to the Current Medication list given to you today.  Labwork: TSH/FT4 TODAY   Testing/Procedures: NONE  Follow-Up: Your physician recommends that you schedule a follow-up appointment in: December   If you need a refill on your cardiac medications before your next appointment, please call your pharmacy.

## 2016-10-20 ENCOUNTER — Other Ambulatory Visit: Payer: Self-pay | Admitting: Internal Medicine

## 2016-10-20 DIAGNOSIS — R52 Pain, unspecified: Secondary | ICD-10-CM

## 2016-10-21 ENCOUNTER — Ambulatory Visit
Admission: RE | Admit: 2016-10-21 | Discharge: 2016-10-21 | Disposition: A | Discharge status: Home / Self Care | Payer: Medicare Other | Source: Ambulatory Visit | Attending: Internal Medicine | Admitting: Internal Medicine

## 2016-10-21 DIAGNOSIS — R52 Pain, unspecified: Secondary | ICD-10-CM

## 2016-11-12 ENCOUNTER — Ambulatory Visit: Payer: Self-pay | Admitting: Orthopedic Surgery

## 2016-11-29 NOTE — Patient Instructions (Addendum)
[name]  11/29/2016   Your procedure is scheduled on: 12-08-16   Report to [facility] Main  Entrance Report to Admitting at 8:15 AM   Call this number if you have problems the morning of surgery  [phone]   Remember: ONLY 1 PERSON MAY GO WITH YOU TO SHORT STAY TO GET  READY MORNING OF [place].  Do not eat food or drink liquids :After Midnight.     Take these medicines the morning of surgery with A SIP OF WATER: Levothyroxine (Synthroid), Metoprolol Succinate (Toprol-XL), and Omeprazole  (Prilosec).You may also use take your pain medication as needed. DO NOT TAKE ANY DIABETIC MEDICATIONS DAY OF YOUR SURGERY                               You may not have any metal on your body including hair pins and              piercings  Do not wear jewelry, lotions, powders or perfumes, deodorant             Men may shave face and neck.   Do not bring valuables to the hospital. [place].  Contacts, dentures or bridgework may not be worn into surgery.  Leave suitcase in the car. After surgery it may be brought to your room.               Please bring your mask and tubing for your CPAP machine                           Please read over the following fact sheets you were given: _____________________________________________________________________ How to Manage Your Diabetes Before and After Surgery  Why is it important to control my blood sugar before and after surgery? . Improving blood sugar levels before and after surgery helps healing and can limit problems. . A way of improving blood sugar control is eating a healthy diet by: o  Eating less sugar and carbohydrates o  Increasing activity/exercise o  Talking with your doctor about reaching your blood sugar goals . High blood sugars (greater than 180 mg/dL) can raise your risk of infections and slow your recovery, so you will need to focus on controlling  your diabetes during the weeks before surgery. . Make sure that the doctor who takes care of your diabetes knows about your planned surgery including the date and location.  How do I manage my blood sugar before surgery? . Check your blood sugar at least 4 times a day, starting 2 days before surgery, to make sure that the level is not too high or low. o Check your blood sugar the morning of your surgery when you wake up and every 2 hours until you get to the Short Stay unit. . If your blood sugar is less than 70 mg/dL, you will need to treat for low blood sugar: o Do not take insulin. o Treat a low blood sugar (less than 70 mg/dL) with  cup of clear juice (cranberry or apple), 4 glucose tablets, OR glucose gel. o Recheck blood sugar in 15 minutes after treatment (to make sure it is greater than 70 mg/dL). If your  blood sugar is not greater than 70 mg/dL on recheck, call [phone] for further instructions. . Report your blood sugar to the short stay nurse when you get to Short Stay.  . If you are admitted to the hospital after surgery: o Your blood sugar will be checked by the staff and you will probably be given insulin after surgery (instead of oral diabetes medicines) to make sure you have good blood sugar levels. o The goal for blood sugar control after surgery is 80-180 mg/dL.   WHAT DO I DO ABOUT MY DIABETES MEDICATION?  [name] Kitchen Do not take oral diabetes medicines (pills) the morning of surgery.  . THE DAY BEFORE SURGERY, take your usual dose of Piglitazone-Metformin (Actos-Met)        Patient Signature:  Date:   Nurse Signature:  Date:   Reviewed and Endorsed by [facility] Patient Education Committee, August 2015            [facility] - Preparing for Surgery Before surgery, you can play an important role.  Because skin is not sterile, your skin needs to be as free of germs as possible.  You can reduce the number of germs on your skin by washing with CHG (chlorahexidine  gluconate) soap before surgery.  CHG is an antiseptic cleaner which kills germs and bonds with the skin to continue killing germs even after washing. Please DO NOT use if you have an allergy to CHG or antibacterial soaps.  If your skin becomes reddened/irritated stop using the CHG and inform your nurse when you arrive at Short Stay. Do not shave (including legs and underarms) for at least 48 hours prior to the first CHG shower.  You may shave your face/neck. Please follow these instructions carefully:  1.  Shower with CHG Soap the night before surgery and the  morning of Surgery.  2.  If you choose to wash your hair, wash your hair first as usual with your  normal  shampoo.  3.  After you shampoo, rinse your hair and body thoroughly to remove the  shampoo.                           4.  Use CHG as you would any other liquid soap.  You can apply chg directly  to the skin and wash                       Gently with a scrungie or clean washcloth.  5.  Apply the CHG Soap to your body ONLY FROM THE NECK DOWN.   Do not use on face/ open                           Wound or open sores. Avoid contact with eyes, ears mouth and genitals (private parts).                       Wash face,  Genitals (private parts) with your normal soap.             6.  Wash thoroughly, paying special attention to the area where your surgery  will be performed.  7.  Thoroughly rinse your body with warm water from the neck down.  8.  DO NOT shower/wash with your normal soap after using and rinsing off  the CHG Soap.  9.  Pat yourself dry with a clean towel.            10.  Wear clean pajamas.            11.  Place clean sheets on your bed the night of your first shower and do not  sleep with pets. Day of Surgery : Do not apply any lotions/deodorants the morning of surgery.  Please wear clean clothes to the hospital/surgery center.  FAILURE TO FOLLOW THESE INSTRUCTIONS MAY RESULT IN THE CANCELLATION OF YOUR  SURGERY PATIENT SIGNATURE_________________________________  NURSE SIGNATURE__________________________________  ________________________________________________________________________   [name]  An incentive spirometer is a tool that can help keep your lungs clear and active. This tool measures how well you are filling your lungs with each breath. Taking long deep breaths may help reverse or decrease the chance of developing breathing (pulmonary) problems (especially infection) following:  A long period of time when you are unable to move or be active. BEFORE THE PROCEDURE   If the spirometer includes an indicator to show your best effort, your nurse or respiratory therapist will set it to a desired goal.  If possible, sit up straight or lean slightly forward. Try not to slouch.  Hold the incentive spirometer in an upright position. INSTRUCTIONS FOR USE  1. Sit on the edge of your bed if possible, or sit up as far as you can in bed or on a chair. 2. Hold the incentive spirometer in an upright position. 3. Breathe out normally. 4. Place the mouthpiece in your mouth and seal your lips tightly around it. 5. Breathe in slowly and as deeply as possible, raising the piston or the ball toward the top of the column. 6. Hold your breath for 3-5 seconds or for as long as possible. Allow the piston or ball to fall to the bottom of the column. 7. Remove the mouthpiece from your mouth and breathe out normally. 8. Rest for a few seconds and repeat Steps 1 through 7 at least 10 times every 1-2 hours when you are awake. Take your time and take a few normal breaths between deep breaths. 9. The spirometer may include an indicator to show your best effort. Use the indicator as a goal to work toward during each repetition. 10. After each set of 10 deep breaths, practice coughing to be sure your lungs are clear. If you have an incision (the cut made at the time of surgery), support your  incision when coughing by placing a pillow or rolled up towels firmly against it. Once you are able to get out of bed, walk around indoors and cough well. You may stop using the incentive spirometer when instructed by your caregiver.  RISKS AND COMPLICATIONS  Take your time so you do not get dizzy or light-headed.  If you are in pain, you may need to take or ask for pain medication before doing incentive spirometry. It is harder to take a deep breath if you are having pain. AFTER USE  Rest and breathe slowly and easily.  It can be helpful to keep track of a log of your progress. Your caregiver can provide you with a simple table to help with this. If you are using the spirometer at home, follow these instructions: [place] IF:   You are having difficultly using the spirometer.  You have trouble using the spirometer as often as instructed.  Your pain medication is not giving enough relief while using the spirometer.  You  develop fever of 100.5 F (38.1 C) or higher. SEEK IMMEDIATE MEDICAL CARE IF:   You cough up bloody sputum that had not been present before.  You develop fever of 102 F (38.9 C) or greater.  You develop worsening pain at or near the incision site. MAKE SURE YOU:   Understand these instructions.  Will watch your condition.  Will get help right away if you are not doing well or get worse. Document Released: 06/07/2006 Document Revised: 04/19/2011 Document Reviewed: 08/08/2006 ExitCare Patient Information 2014 ExitCare, [place].   ________________________________________________________________________  WHAT IS A BLOOD TRANSFUSION? Blood Transfusion Information  A transfusion is the replacement of blood or some of its parts. Blood is made up of multiple cells which provide different functions.  Red blood cells carry oxygen and are used for blood loss replacement.  White blood cells fight against infection.  Platelets control bleeding.  Plasma  helps clot blood.  Other blood products are available for specialized needs, such as hemophilia or other clotting disorders. BEFORE THE TRANSFUSION  Who gives blood for transfusions?   Healthy volunteers who are fully evaluated to make sure their blood is safe. This is blood bank blood. Transfusion therapy is the safest it has ever been in the practice of medicine. Before blood is taken from a donor, a complete history is taken to make sure that person has no history of diseases nor engages in risky social behavior (examples are intravenous drug use or sexual activity with multiple partners). The donor's travel history is screened to minimize risk of transmitting infections, such as malaria. The donated blood is tested for signs of infectious diseases, such as HIV and hepatitis. The blood is then tested to be sure it is compatible with you in order to minimize the chance of a transfusion reaction. If you or a relative donates blood, this is often done in anticipation of surgery and is not appropriate for emergency situations. It takes many days to process the donated blood. RISKS AND COMPLICATIONS Although transfusion therapy is very safe and saves many lives, the main dangers of transfusion include:   Getting an infectious disease.  Developing a transfusion reaction. This is an allergic reaction to something in the blood you were given. Every precaution is taken to prevent this. The decision to have a blood transfusion has been considered carefully by your caregiver before blood is given. Blood is not given unless the benefits outweigh the risks. AFTER THE TRANSFUSION  Right after receiving a blood transfusion, you will usually feel much better and more energetic. This is especially true if your red blood cells have gotten low (anemic). The transfusion raises the level of the red blood cells which carry oxygen, and this usually causes an energy increase.  The nurse administering the transfusion  will monitor you carefully for complications. HOME CARE INSTRUCTIONS  No special instructions are needed after a transfusion. You may find your energy is better. Speak with your caregiver about any limitations on activity for underlying diseases you may have. SEEK MEDICAL CARE IF:   Your condition is not improving after your transfusion.  You develop redness or irritation at the intravenous (IV) site. SEEK IMMEDIATE MEDICAL CARE IF:  Any of the following symptoms occur over the next 12 hours:  Shaking chills.  You have a temperature by mouth above 102 F (38.9 C), not controlled by medicine.  Chest, back, or muscle pain.  People around you feel you are not acting correctly or are confused.  Shortness of  breath or difficulty breathing.  Dizziness and fainting.  You get a rash or develop hives.  You have a decrease in urine output.  Your urine turns a dark color or changes to pink, red, or brown. Any of the following symptoms occur over the next 10 days:  You have a temperature by mouth above 102 F (38.9 C), not controlled by medicine.  Shortness of breath.  Weakness after normal activity.  The white part of the eye turns yellow (jaundice).  You have a decrease in the amount of urine or are urinating less often.  Your urine turns a dark color or changes to pink, red, or brown. Document Released: 01/23/2000 Document Revised: 04/19/2011 Document Reviewed: 09/11/2007 [facility] Patient Information 2014 [place], [place].  _______________________________________________________________________

## 2016-11-29 NOTE — Progress Notes (Signed)
10-06-16 LOV with Dr. Duke Salviaandolph, Cardiologist  10-19-16 Office notes from Dr. Selena BattenKim   10-13-16 HGA1C,   07-02-16 (EPIC) EKG, and ECHO

## 2016-11-30 ENCOUNTER — Encounter (HOSPITAL_COMMUNITY): Payer: Self-pay

## 2016-11-30 ENCOUNTER — Encounter (HOSPITAL_COMMUNITY)
Admission: RE | Admit: 2016-11-30 | Discharge: 2016-11-30 | Disposition: A | Discharge status: Home / Self Care | Payer: Medicare Other | Source: Ambulatory Visit | Attending: Orthopedic Surgery | Admitting: Orthopedic Surgery

## 2016-11-30 DIAGNOSIS — X58XXXA Exposure to other specified factors, initial encounter: Secondary | ICD-10-CM | POA: Diagnosis not present

## 2016-11-30 DIAGNOSIS — T84093A Other mechanical complication of internal left knee prosthesis, initial encounter: Secondary | ICD-10-CM | POA: Diagnosis not present

## 2016-11-30 DIAGNOSIS — Z01812 Encounter for preprocedural laboratory examination: Secondary | ICD-10-CM | POA: Diagnosis present

## 2016-11-30 LAB — CBC
HCT: 40.4 % (ref 39.0–52.0)
Hemoglobin: 14.5 g/dL (ref 13.0–17.0)
MCH: 35.5 pg — ABNORMAL HIGH (ref 26.0–34.0)
MCHC: 35.9 g/dL (ref 30.0–36.0)
MCV: 99 fL (ref 78.0–100.0)
Platelets: 207 10*3/uL (ref 150–400)
RBC: 4.08 MIL/uL — ABNORMAL LOW (ref 4.22–5.81)
RDW: 13.2 % (ref 11.5–15.5)
WBC: 6.1 10*3/uL (ref 4.0–10.5)

## 2016-11-30 LAB — SURGICAL PCR SCREEN
MRSA, PCR: NEGATIVE
Staphylococcus aureus: NEGATIVE

## 2016-11-30 LAB — COMPREHENSIVE METABOLIC PANEL
ALT: 21 U/L (ref 17–63)
AST: 24 U/L (ref 15–41)
Albumin: 3.9 g/dL (ref 3.5–5.0)
Alkaline Phosphatase: 55 U/L (ref 38–126)
Anion gap: 10 (ref 5–15)
BUN: 15 mg/dL (ref 6–20)
CO2: 25 mmol/L (ref 22–32)
Calcium: 9.5 mg/dL (ref 8.9–10.3)
Chloride: 106 mmol/L (ref 101–111)
Creatinine, Ser: 0.84 mg/dL (ref 0.61–1.24)
GFR calc Af Amer: >60 mL/min (ref >60)
GFR calc non Af Amer: >60 mL/min (ref >60)
Glucose, Bld: 168 mg/dL — ABNORMAL HIGH (ref 65–99)
Potassium: 4.6 mmol/L (ref 3.5–5.1)
Sodium: 141 mmol/L (ref 135–145)
Total Bilirubin: 1.6 mg/dL — ABNORMAL HIGH (ref 0.3–1.2)
Total Protein: 6.8 g/dL (ref 6.5–8.1)

## 2016-11-30 LAB — PROTIME-INR
INR: 1.11
Prothrombin Time: 14.2 seconds (ref 11.4–15.2)

## 2016-11-30 LAB — APTT: aPTT: 45 seconds — ABNORMAL HIGH (ref 24–36)

## 2016-11-30 LAB — GLUCOSE, CAPILLARY: Glucose-Capillary: 166 mg/dL — ABNORMAL HIGH (ref 65–99)

## 2016-11-30 NOTE — Progress Notes (Signed)
11-30-16  Discussed Dr. Leonides Sakeandolph's 10-06-16 office note with Dr. Desmond Lopeurk. Pt currently asymptomatic. Verifying if pt requires cardiac clearance from cardiology. Per Dr. Desmond Lopeurk, no cardiac clearance needed.

## 2016-11-30 NOTE — Progress Notes (Signed)
Lab called to request that another T&S be drawn the day of surgery. Pt has a special antibody that could potentially delay surgery, if not repeated. T&S ordered for day of surgery.

## 2016-11-30 NOTE — Progress Notes (Signed)
11-30-16  PTT result routed to Dr. Lequita [facility]altAluisio [facility].

## 2016-12-01 LAB — TYPE AND SCREEN
ABO/RH(D): A POS
Antibody Screen: POSITIVE

## 2016-12-05 ENCOUNTER — Ambulatory Visit: Payer: Self-pay | Admitting: Orthopedic Surgery

## 2016-12-05 NOTE — H&P (Signed)
[name] MelenaGary P Dyer DOB: 04-11-1940 Married / Language: [place] / Race: White Male Date of admission: December 08, 2016  Chief complaint: failed left total knee History of Present Illness  The patient is a 76 year old male who comes in  for a preoperative History and Physical. The patient is scheduled for a left femoral revision to be performed [facility] Dr. Gus RankinFrank V. Aluisio[facility]. The patient was seen post left total knee arthroplasty and s/p manipulation. The patient states that he is not doing well at this time. The pain is under poor control at this time and describe their pain as moderate. They are currently on Oxycodone for their pain. The patient is currently doing outpatient physical therapy which has not been helping. Unfortunately he is not progressing at all in regards to the knee extension. He is satisfied with the knee flexion. Walking with the flexion contractures causing pain in his lower back and hip. 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 He feels like physical therapy has not helped anymore. He is very discouraged with how he is currently doing. Unfortunately he has a bad problem with this knee with the flexion contracture. He has worked really hard to try and get the extension back including using a JAS brace but it is not working for him. I would recommend surgery in the form of a femoral revision to elevate the joint line and increase his extension space to try and allow him to get more extension. I have told him that anything short of that would not work. There is no guarantee with this proposed surgery that he will get full extension but I do believe that is the only chance he has to obtain that. We did discuss the procedure, risks, potential complications and rehab course in detail and he wants to proceed with femoral revision. Risks and benefits were disucssed and the patient wishes to proceed.  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)  Pes anserine bursitis (M70.50)  Hypercholesterolemia  Sleep Apnea  uses CPAP Diabetes Mellitus, Type II  Irritable bowel syndrome  Gastroesophageal Reflux Disease  Closed fracture of left tibial plateau, with routine healing, subsequent encounter (S82.142D)  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)  Atrial Fibrillation  Hypothyroidism  Enlarged prostate   Allergies No Known Drug Allergies   Family History Diabetes Mellitus  brother and grandfather mothers side Cancer  mother, father and brother  Social History Marital status  married Exercise  Exercises weekly; does gym / [employer] Living situation  live with spouse Tobacco use  former smoker; smoke(d) 1 pack(s) per day Pain Contract  no Illicit drug use  no Copy of Drug/Alcohol Rehab (Previously)  no Alcohol use  current drinker; drinks beer and wine; only occasionally per week Drug/Alcohol Rehab (Currently)  no Number of flights of stairs before winded  1 Current work status  working part time Children  5 or more Tobacco / smoke exposure  no  Medication History Pioglitazone HCl-Metformin HCl (15-500MG  Tablet, 1 Oral bid) Active. PriLOSEC (20MG  Capsule DR, 1 Oral daily) Active. Centrum Silver Ultra Mens (Oral) Specific strength unknown - Active. Citrucel (Oral two times daily) Active. Glucosamine / Chondroitin  Active. Fish Oil (1200 two times daily) Active. Atorvastatin Active. Vitamin D3 Active. Finasteride (5MG  Tablet, Oral) Active. (qd) Saw Palmetto (540mg  Daily) Active. Biotin 5000 (5MG  Capsule, Oral)  Active. (qd) Stool Softener (100MG  Capsule, Oral) Active. (qd) Doxycycline Hyclate (100MG  Tablet, Oral) Active. Vitamin B12 TR ( Tablet ER, 5 Oral daily) Active. OxyCODONE HCl (5MG  Tablet, 1 (one) Oral po q6 prn pain, Taken starting 10/28/2016) Active. Eliquis (5MG  Tablet, Oral) Active. Metoprolol Tartrate (25MG  Tablet, Oral) Active. Levothroid (Oral) Specific strength unknown - Active.  Past Surgical History  Rotator Cuff Repair  bilateral Total Knee Replacement  right Arthroscopy of Shoulder  bilateral Neck Surgery  acdf [id number] Resection of Stomach  Hemorrhoidectomy  Straighten Nasal Septum  Back Surgery  times three    Review of Systems General Not Present- Chills, Fatigue, Fever, Memory Loss, Night Sweats, Weight Gain and Weight Loss. Skin Not Present- Eczema, Hives, Itching, Lesions and Rash. HEENT Not Present- Dentures, Double Vision, Headache, Hearing Loss, Tinnitus and Visual Loss. Respiratory Not Present- Allergies, Chronic Cough, Coughing up blood, Shortness of breath at rest and Shortness of breath with exertion. Cardiovascular Not Present- Chest Pain, Difficulty Breathing Lying Down, Murmur, Palpitations, Racing/skipping heartbeats and Swelling. Gastrointestinal Not Present- Abdominal Pain, Bloody Stool, Constipation, Diarrhea, Difficulty Swallowing, Heartburn, Jaundice, Loss of appetitie, Nausea and Vomiting. Male Genitourinary Not Present- Blood in Urine, Discharge, Flank Pain, Incontinence, Painful Urination, Urgency, Urinary frequency, Urinary Retention, Urinating at Night and Weak urinary stream. Musculoskeletal Present- Joint Stiffness. Not Present- Back Pain, Joint Pain, Joint Swelling, Morning Stiffness, Muscle Pain, Muscle Weakness and Spasms. Neurological Not Present- Blackout spells, Difficulty with balance, Dizziness, Paralysis, Tremor and Weakness. Psychiatric Not Present- Insomnia.  Vitals Height: 67.5in Height was reported by  patient. Pulse: 60 (Regular)  BP: 118/68 (Sitting, Right Arm, Standard)       Physical Exam  General Mental Status -Alert, cooperative and good historian. General Appearance-pleasant, Not in acute distress. Orientation-Oriented X3. Build & Nutrition-Well nourished and Well developed.  Head and Neck Head-normocephalic, atraumatic . Neck Global Assessment - supple, no bruit auscultated on the right, no bruit auscultated on the left.  Eye Vision-Wears corrective lenses. Pupil - Bilateral-Regular and Round. Motion - Bilateral-EOMI.  Chest and Lung Exam Auscultation Breath sounds - clear at anterior chest wall and clear at posterior chest wall. Adventitious sounds - No Adventitious sounds.  Cardiovascular Auscultation Rhythm - Regular rate and rhythm. Heart Sounds - S1 WNL and S2 WNL. Murmurs & Other Heart Sounds - Auscultation of the heart reveals - No Murmurs.  Abdomen Palpation/Percussion Tenderness - Abdomen is non-tender to palpation. Rigidity (guarding) - Abdomen is soft. Auscultation Auscultation of the abdomen reveals - Bowel sounds normal.  Male Genitourinary Note: Not done, not pertinent to present illness   Musculoskeletal Note: Evaluation of his LEFT knee shows no warmth or effusion. His range of motion is 20-105 actively and 110 passively. He does not have any instability about the knee. He is walking with a significantly antalgic gait pattern.   Assessment & Plan  Arthrofibrosis of knee joint, left (H84.696) Aftercare following left knee joint replacement surgery (Z47.1, E95.284)  Note:Surgical Plans: Left Femoral Knee Revision  Disposition: Home with HHPT, [facility]  PCP: Dr. Pearson Grippe  IV TXA  Anesthesia Issues: None  Patient was instructed on what medications to stop prior to surgery. .  Signed electronically by Lauraine Rinne, III PA-C

## 2016-12-08 ENCOUNTER — Inpatient Hospital Stay (HOSPITAL_COMMUNITY)
Admission: RE | Admit: 2016-12-08 | Discharge: 2016-12-09 | DRG: 468 | Disposition: A | Discharge status: Home Health | Payer: Medicare Other | Source: Ambulatory Visit | Attending: Orthopedic Surgery | Admitting: Orthopedic Surgery

## 2016-12-08 ENCOUNTER — Encounter (HOSPITAL_COMMUNITY)
Admission: RE | Disposition: A | Discharge status: Home Health | Payer: Self-pay | Source: Ambulatory Visit | Attending: Orthopedic Surgery

## 2016-12-08 ENCOUNTER — Encounter (HOSPITAL_COMMUNITY): Payer: Self-pay | Admitting: *Deleted

## 2016-12-08 ENCOUNTER — Inpatient Hospital Stay (HOSPITAL_COMMUNITY): Payer: Medicare Other | Admitting: Anesthesiology

## 2016-12-08 DIAGNOSIS — Z96652 Presence of left artificial knee joint: Secondary | ICD-10-CM | POA: Diagnosis not present

## 2016-12-08 DIAGNOSIS — K22719 Barrett's esophagus with dysplasia, unspecified: Secondary | ICD-10-CM | POA: Diagnosis present

## 2016-12-08 DIAGNOSIS — T8482XA Fibrosis due to internal orthopedic prosthetic devices, implants and grafts, initial encounter: Principal | ICD-10-CM | POA: Diagnosis present

## 2016-12-08 DIAGNOSIS — N400 Enlarged prostate without lower urinary tract symptoms: Secondary | ICD-10-CM | POA: Diagnosis not present

## 2016-12-08 DIAGNOSIS — M5030 Other cervical disc degeneration, unspecified cervical region: Secondary | ICD-10-CM | POA: Diagnosis present

## 2016-12-08 DIAGNOSIS — Z96651 Presence of right artificial knee joint: Secondary | ICD-10-CM | POA: Diagnosis not present

## 2016-12-08 DIAGNOSIS — M5442 Lumbago with sciatica, left side: Secondary | ICD-10-CM | POA: Diagnosis present

## 2016-12-08 DIAGNOSIS — Y838 Other surgical procedures as the cause of abnormal reaction of the patient, or of later complication, without mention of misadventure at the time of the procedure: Secondary | ICD-10-CM | POA: Diagnosis present

## 2016-12-08 DIAGNOSIS — Z87891 Personal history of nicotine dependence: Secondary | ICD-10-CM

## 2016-12-08 DIAGNOSIS — Z833 Family history of diabetes mellitus: Secondary | ICD-10-CM

## 2016-12-08 DIAGNOSIS — Z8711 Personal history of peptic ulcer disease: Secondary | ICD-10-CM

## 2016-12-08 DIAGNOSIS — E039 Hypothyroidism, unspecified: Secondary | ICD-10-CM | POA: Diagnosis not present

## 2016-12-08 DIAGNOSIS — Z7901 Long term (current) use of anticoagulants: Secondary | ICD-10-CM

## 2016-12-08 DIAGNOSIS — E785 Hyperlipidemia, unspecified: Secondary | ICD-10-CM | POA: Diagnosis not present

## 2016-12-08 DIAGNOSIS — Z79899 Other long term (current) drug therapy: Secondary | ICD-10-CM

## 2016-12-08 DIAGNOSIS — I4891 Unspecified atrial fibrillation: Secondary | ICD-10-CM | POA: Diagnosis present

## 2016-12-08 DIAGNOSIS — H9190 Unspecified hearing loss, unspecified ear: Secondary | ICD-10-CM | POA: Diagnosis present

## 2016-12-08 DIAGNOSIS — K219 Gastro-esophageal reflux disease without esophagitis: Secondary | ICD-10-CM | POA: Diagnosis present

## 2016-12-08 DIAGNOSIS — G4733 Obstructive sleep apnea (adult) (pediatric): Secondary | ICD-10-CM | POA: Diagnosis present

## 2016-12-08 DIAGNOSIS — E119 Type 2 diabetes mellitus without complications: Secondary | ICD-10-CM | POA: Diagnosis not present

## 2016-12-08 DIAGNOSIS — Z79890 Hormone replacement therapy (postmenopausal): Secondary | ICD-10-CM

## 2016-12-08 DIAGNOSIS — Z96659 Presence of unspecified artificial knee joint: Secondary | ICD-10-CM

## 2016-12-08 DIAGNOSIS — T84018A Broken internal joint prosthesis, other site, initial encounter: Secondary | ICD-10-CM

## 2016-12-08 HISTORY — PX: FEMORAL REVISION: SHX5830

## 2016-12-08 LAB — GLUCOSE, CAPILLARY
Glucose-Capillary: 215 mg/dL — ABNORMAL HIGH (ref 65–99)
Glucose-Capillary: 157 mg/dL — ABNORMAL HIGH (ref 65–99)
Glucose-Capillary: 217 mg/dL — ABNORMAL HIGH (ref 65–99)
Glucose-Capillary: 332 mg/dL — ABNORMAL HIGH (ref 65–99)

## 2016-12-08 SURGERY — FEMORAL REVISION
Anesthesia: General | Site: Knee | Laterality: Left

## 2016-12-08 MED ORDER — APIXABAN 2.5 MG PO TABS
2.5000 mg | ORAL_TABLET | Freq: Two times a day (BID) | ORAL | Status: DC
  Administered 2016-12-09: 2.5 mg via ORAL
  Filled 2016-12-08: qty 1

## 2016-12-08 MED ORDER — SODIUM CHLORIDE 0.9 % IJ SOLN
INTRAMUSCULAR | Status: AC
  Filled 2016-12-08: qty 10

## 2016-12-08 MED ORDER — ACETAMINOPHEN 650 MG RE SUPP: 650.0000 mg | RECTAL | Status: DC | PRN

## 2016-12-08 MED ORDER — BUPIVACAINE LIPOSOME 1.3 % IJ SUSP
20.0000 mL | Freq: Once | INTRAMUSCULAR | Status: DC
  Filled 2016-12-08: qty 20

## 2016-12-08 MED ORDER — GABAPENTIN 300 MG PO CAPS
ORAL_CAPSULE | ORAL | Status: AC
  Administered 2016-12-08: 300 mg via ORAL
  Filled 2016-12-08: qty 1

## 2016-12-08 MED ORDER — PIOGLITAZONE HCL 15 MG PO TABS
15.0000 mg | ORAL_TABLET | Freq: Every day | ORAL | Status: DC
  Administered 2016-12-09: 15 mg via ORAL
  Filled 2016-12-08: qty 1

## 2016-12-08 MED ORDER — BUPIVACAINE LIPOSOME 1.3 % IJ SUSP
INTRAMUSCULAR | Status: DC | PRN
  Administered 2016-12-08: 20 mL

## 2016-12-08 MED ORDER — ATORVASTATIN CALCIUM 40 MG PO TABS
80.0000 mg | ORAL_TABLET | ORAL | Status: DC
  Administered 2016-12-08: 18:00:00 80 mg via ORAL
  Filled 2016-12-08: qty 2

## 2016-12-08 MED ORDER — SUCCINYLCHOLINE CHLORIDE 200 MG/10ML IV SOSY
PREFILLED_SYRINGE | INTRAVENOUS | Status: DC | PRN
  Administered 2016-12-08: 120 mg via INTRAVENOUS

## 2016-12-08 MED ORDER — LIDOCAINE 2% (20 MG/ML) 5 ML SYRINGE
INTRAMUSCULAR | Status: DC | PRN
  Administered 2016-12-08: 100 mg via INTRAVENOUS

## 2016-12-08 MED ORDER — METFORMIN HCL 500 MG PO TABS
500.0000 mg | ORAL_TABLET | Freq: Every day | ORAL | Status: DC
  Administered 2016-12-09: 500 mg via ORAL
  Filled 2016-12-08: qty 1

## 2016-12-08 MED ORDER — ACETAMINOPHEN 500 MG PO TABS
1000.0000 mg | ORAL_TABLET | Freq: Four times a day (QID) | ORAL | Status: DC
  Administered 2016-12-08 – 2016-12-09 (×3): 1000 mg via ORAL
  Filled 2016-12-08 (×3): qty 2

## 2016-12-08 MED ORDER — FENTANYL CITRATE (PF) 100 MCG/2ML IJ SOLN
INTRAMUSCULAR | Status: DC | PRN
  Administered 2016-12-08 (×2): 50 ug via INTRAVENOUS

## 2016-12-08 MED ORDER — INSULIN ASPART 100 UNIT/ML ~~LOC~~ SOLN
0.0000 [IU] | Freq: Three times a day (TID) | SUBCUTANEOUS | Status: DC
  Administered 2016-12-08: 18:00:00 5 [IU] via SUBCUTANEOUS
  Administered 2016-12-09: 8 [IU] via SUBCUTANEOUS
  Administered 2016-12-09: 3 [IU] via SUBCUTANEOUS

## 2016-12-08 MED ORDER — SODIUM CHLORIDE 0.9 % IR SOLN
Status: DC | PRN
  Administered 2016-12-08: 2000 mL

## 2016-12-08 MED ORDER — FENTANYL CITRATE (PF) 100 MCG/2ML IJ SOLN
INTRAMUSCULAR | Status: AC
  Filled 2016-12-08: qty 2

## 2016-12-08 MED ORDER — METHOCARBAMOL 1000 MG/10ML IJ SOLN
500.0000 mg | Freq: Four times a day (QID) | INTRAMUSCULAR | Status: DC | PRN
  Administered 2016-12-08: 500 mg via INTRAVENOUS
  Filled 2016-12-08: qty 550

## 2016-12-08 MED ORDER — SUCCINYLCHOLINE CHLORIDE 200 MG/10ML IV SOSY
PREFILLED_SYRINGE | INTRAVENOUS | Status: AC
  Filled 2016-12-08: qty 10

## 2016-12-08 MED ORDER — LACTATED RINGERS IV SOLN
INTRAVENOUS | Status: DC
  Administered 2016-12-08: 09:00:00 via INTRAVENOUS

## 2016-12-08 MED ORDER — METOCLOPRAMIDE HCL 5 MG/ML IJ SOLN: 5.0000 mg | Freq: Three times a day (TID) | INTRAMUSCULAR | Status: DC | PRN

## 2016-12-08 MED ORDER — FENTANYL CITRATE (PF) 100 MCG/2ML IJ SOLN
25.0000 ug | INTRAMUSCULAR | Status: DC | PRN
  Administered 2016-12-08 (×2): 50 ug via INTRAVENOUS

## 2016-12-08 MED ORDER — FINASTERIDE 5 MG PO TABS
5.0000 mg | ORAL_TABLET | Freq: Every day | ORAL | Status: DC
  Administered 2016-12-08: 21:00:00 5 mg via ORAL
  Filled 2016-12-08: qty 1

## 2016-12-08 MED ORDER — SODIUM CHLORIDE 0.9 % IV SOLN
1000.0000 mg | INTRAVENOUS | Status: AC
  Administered 2016-12-08: 1000 mg via INTRAVENOUS
  Filled 2016-12-08: qty 1100

## 2016-12-08 MED ORDER — MIDAZOLAM HCL 2 MG/2ML IJ SOLN
2.0000 mg | Freq: Once | INTRAMUSCULAR | Status: AC
  Administered 2016-12-08: 2 mg via INTRAVENOUS

## 2016-12-08 MED ORDER — SUGAMMADEX SODIUM 200 MG/2ML IV SOLN
INTRAVENOUS | Status: DC | PRN
  Administered 2016-12-08: 200 mg via INTRAVENOUS

## 2016-12-08 MED ORDER — TRANEXAMIC ACID 1000 MG/10ML IV SOLN
1000.0000 mg | Freq: Once | INTRAVENOUS | Status: AC
  Administered 2016-12-08: 1000 mg via INTRAVENOUS
  Filled 2016-12-08: qty 1100

## 2016-12-08 MED ORDER — METHOCARBAMOL 500 MG PO TABS
500.0000 mg | ORAL_TABLET | Freq: Four times a day (QID) | ORAL | Status: DC | PRN
  Administered 2016-12-08 – 2016-12-09 (×2): 500 mg via ORAL
  Filled 2016-12-08 (×2): qty 1

## 2016-12-08 MED ORDER — STERILE WATER FOR IRRIGATION IR SOLN
Status: DC | PRN
  Administered 2016-12-08: 2000 mL

## 2016-12-08 MED ORDER — SODIUM CHLORIDE 0.9 % IV SOLN
INTRAVENOUS | Status: DC
  Administered 2016-12-08: 16:00:00 via INTRAVENOUS

## 2016-12-08 MED ORDER — ATORVASTATIN CALCIUM 40 MG PO TABS: 40.0000 mg | ORAL_TABLET | ORAL | Status: DC

## 2016-12-08 MED ORDER — MORPHINE SULFATE (PF) 4 MG/ML IV SOLN: 1.0000 mg | INTRAVENOUS | Status: DC | PRN

## 2016-12-08 MED ORDER — EPHEDRINE SULFATE-NACL 50-0.9 MG/10ML-% IV SOSY
PREFILLED_SYRINGE | INTRAVENOUS | Status: DC | PRN
  Administered 2016-12-08 (×2): 10 mg via INTRAVENOUS

## 2016-12-08 MED ORDER — SODIUM CHLORIDE 0.9 % IR SOLN
Status: DC | PRN
  Administered 2016-12-08: 1000 mL

## 2016-12-08 MED ORDER — MIDAZOLAM HCL 2 MG/2ML IJ SOLN
INTRAMUSCULAR | Status: AC
  Administered 2016-12-08: 2 mg via INTRAVENOUS
  Filled 2016-12-08: qty 2

## 2016-12-08 MED ORDER — ACETAMINOPHEN 10 MG/ML IV SOLN: 1000.0000 mg | Freq: Once | INTRAVENOUS | Status: DC

## 2016-12-08 MED ORDER — FENTANYL CITRATE (PF) 100 MCG/2ML IJ SOLN
INTRAMUSCULAR | Status: AC
  Administered 2016-12-08: 50 ug via INTRAVENOUS
  Filled 2016-12-08: qty 2

## 2016-12-08 MED ORDER — FLEET ENEMA 7-19 GM/118ML RE ENEM: 1.0000 | ENEMA | Freq: Once | RECTAL | Status: DC | PRN

## 2016-12-08 MED ORDER — ONDANSETRON HCL 4 MG/2ML IJ SOLN
INTRAMUSCULAR | Status: DC | PRN
  Administered 2016-12-08 (×2): 4 mg via INTRAVENOUS

## 2016-12-08 MED ORDER — POLYETHYLENE GLYCOL 3350 17 G PO PACK: 17.0000 g | PACK | Freq: Every day | ORAL | Status: DC | PRN

## 2016-12-08 MED ORDER — EPHEDRINE 5 MG/ML INJ
INTRAVENOUS | Status: AC
  Filled 2016-12-08: qty 10

## 2016-12-08 MED ORDER — ACETAMINOPHEN 10 MG/ML IV SOLN
INTRAVENOUS | Status: AC
  Filled 2016-12-08: qty 100

## 2016-12-08 MED ORDER — HYDROMORPHONE HCL 1 MG/ML IJ SOLN
0.2500 mg | INTRAMUSCULAR | Status: DC | PRN
  Administered 2016-12-08 (×4): 0.5 mg via INTRAVENOUS

## 2016-12-08 MED ORDER — ONDANSETRON HCL 4 MG/2ML IJ SOLN
INTRAMUSCULAR | Status: AC
  Filled 2016-12-08: qty 2

## 2016-12-08 MED ORDER — DEXAMETHASONE SODIUM PHOSPHATE 10 MG/ML IJ SOLN
10.0000 mg | Freq: Once | INTRAMUSCULAR | Status: AC
  Administered 2016-12-09: 10 mg via INTRAVENOUS
  Filled 2016-12-08: qty 1

## 2016-12-08 MED ORDER — DEXAMETHASONE SODIUM PHOSPHATE 10 MG/ML IJ SOLN
INTRAMUSCULAR | Status: DC | PRN
  Administered 2016-12-08: 10 mg via INTRAVENOUS

## 2016-12-08 MED ORDER — PROPOFOL 10 MG/ML IV BOLUS
INTRAVENOUS | Status: DC | PRN
  Administered 2016-12-08: 180 mg via INTRAVENOUS

## 2016-12-08 MED ORDER — ONDANSETRON HCL 4 MG PO TABS: 4.0000 mg | ORAL_TABLET | Freq: Four times a day (QID) | ORAL | Status: DC | PRN

## 2016-12-08 MED ORDER — SODIUM CHLORIDE 0.9 % IJ SOLN
INTRAMUSCULAR | Status: AC
  Filled 2016-12-08: qty 50

## 2016-12-08 MED ORDER — LEVOTHYROXINE SODIUM 25 MCG PO TABS
25.0000 ug | ORAL_TABLET | Freq: Every day | ORAL | Status: DC
  Administered 2016-12-09: 25 ug via ORAL
  Filled 2016-12-08: qty 1

## 2016-12-08 MED ORDER — MENTHOL 3 MG MT LOZG: 1.0000 | LOZENGE | OROMUCOSAL | Status: DC | PRN

## 2016-12-08 MED ORDER — SODIUM CHLORIDE 0.9 % IJ SOLN
INTRAMUSCULAR | Status: DC | PRN
  Administered 2016-12-08: 60 mL

## 2016-12-08 MED ORDER — ONDANSETRON HCL 4 MG/2ML IJ SOLN: 4.0000 mg | Freq: Four times a day (QID) | INTRAMUSCULAR | Status: DC | PRN

## 2016-12-08 MED ORDER — DOCUSATE SODIUM 100 MG PO CAPS
100.0000 mg | ORAL_CAPSULE | Freq: Two times a day (BID) | ORAL | Status: DC
  Administered 2016-12-08 – 2016-12-09 (×2): 100 mg via ORAL
  Filled 2016-12-08 (×2): qty 1

## 2016-12-08 MED ORDER — BISACODYL 10 MG RE SUPP: 10.0000 mg | Freq: Every day | RECTAL | Status: DC | PRN

## 2016-12-08 MED ORDER — DEXAMETHASONE SODIUM PHOSPHATE 10 MG/ML IJ SOLN
INTRAMUSCULAR | Status: AC
  Filled 2016-12-08: qty 1

## 2016-12-08 MED ORDER — DEXAMETHASONE SODIUM PHOSPHATE 10 MG/ML IJ SOLN: 10.0000 mg | Freq: Once | INTRAMUSCULAR | Status: DC

## 2016-12-08 MED ORDER — ACETAMINOPHEN 325 MG PO TABS: 650.0000 mg | ORAL_TABLET | ORAL | Status: DC | PRN

## 2016-12-08 MED ORDER — ROCURONIUM BROMIDE 10 MG/ML (PF) SYRINGE
PREFILLED_SYRINGE | INTRAVENOUS | Status: DC | PRN
  Administered 2016-12-08: 50 mg via INTRAVENOUS

## 2016-12-08 MED ORDER — ROCURONIUM BROMIDE 50 MG/5ML IV SOSY
PREFILLED_SYRINGE | INTRAVENOUS | Status: AC
  Filled 2016-12-08: qty 5

## 2016-12-08 MED ORDER — LIDOCAINE 2% (20 MG/ML) 5 ML SYRINGE
INTRAMUSCULAR | Status: AC
  Filled 2016-12-08: qty 5

## 2016-12-08 MED ORDER — METOPROLOL SUCCINATE ER 25 MG PO TB24
25.0000 mg | ORAL_TABLET | Freq: Two times a day (BID) | ORAL | Status: DC
  Administered 2016-12-08 – 2016-12-09 (×2): 25 mg via ORAL
  Filled 2016-12-08 (×3): qty 1

## 2016-12-08 MED ORDER — CEFAZOLIN SODIUM-DEXTROSE 2-4 GM/100ML-% IV SOLN
INTRAVENOUS | Status: AC
  Filled 2016-12-08: qty 100

## 2016-12-08 MED ORDER — DIPHENHYDRAMINE HCL 12.5 MG/5ML PO ELIX
12.5000 mg | ORAL_SOLUTION | ORAL | Status: DC | PRN
  Administered 2016-12-08: 12.5 mg via ORAL
  Filled 2016-12-08: qty 5

## 2016-12-08 MED ORDER — CEFAZOLIN SODIUM-DEXTROSE 2-4 GM/100ML-% IV SOLN
2.0000 g | Freq: Four times a day (QID) | INTRAVENOUS | Status: AC
  Administered 2016-12-08 (×2): 2 g via INTRAVENOUS
  Filled 2016-12-08 (×2): qty 100

## 2016-12-08 MED ORDER — CEFAZOLIN SODIUM-DEXTROSE 2-4 GM/100ML-% IV SOLN
2.0000 g | INTRAVENOUS | Status: AC
  Administered 2016-12-08: 2 g via INTRAVENOUS

## 2016-12-08 MED ORDER — OXYCODONE HCL 5 MG PO TABS
10.0000 mg | ORAL_TABLET | ORAL | Status: DC | PRN
  Administered 2016-12-08 – 2016-12-09 (×6): 10 mg via ORAL
  Filled 2016-12-08 (×5): qty 2

## 2016-12-08 MED ORDER — PROPOFOL 10 MG/ML IV BOLUS
INTRAVENOUS | Status: AC
  Filled 2016-12-08: qty 20

## 2016-12-08 MED ORDER — PHENOL 1.4 % MT LIQD: 1.0000 | OROMUCOSAL | Status: DC | PRN

## 2016-12-08 MED ORDER — PIOGLITAZONE HCL-METFORMIN HCL 15-500 MG PO TABS: 1.0000 | ORAL_TABLET | Freq: Every day | ORAL | Status: DC

## 2016-12-08 MED ORDER — PANTOPRAZOLE SODIUM 40 MG PO TBEC: 40.0000 mg | DELAYED_RELEASE_TABLET | Freq: Every day | ORAL | Status: DC

## 2016-12-08 MED ORDER — OXYCODONE HCL 5 MG PO TABS
ORAL_TABLET | ORAL | Status: AC
  Filled 2016-12-08: qty 2

## 2016-12-08 MED ORDER — CHLORHEXIDINE GLUCONATE 4 % EX LIQD
60.0000 mL | Freq: Once | CUTANEOUS | Status: DC
Start: 2016-12-08 — End: 2016-12-08

## 2016-12-08 MED ORDER — FENTANYL CITRATE (PF) 100 MCG/2ML IJ SOLN
100.0000 ug | Freq: Once | INTRAMUSCULAR | Status: AC
Start: 2016-12-08 — End: 2016-12-08
  Administered 2016-12-08: 50 ug via INTRAVENOUS

## 2016-12-08 MED ORDER — HYDROMORPHONE HCL 1 MG/ML IJ SOLN
INTRAMUSCULAR | Status: AC
  Filled 2016-12-08: qty 2

## 2016-12-08 MED ORDER — GABAPENTIN 300 MG PO CAPS
300.0000 mg | ORAL_CAPSULE | Freq: Once | ORAL | Status: AC
  Administered 2016-12-08: 300 mg via ORAL

## 2016-12-08 MED ORDER — METOCLOPRAMIDE HCL 5 MG PO TABS: 5.0000 mg | ORAL_TABLET | Freq: Three times a day (TID) | ORAL | Status: DC | PRN

## 2016-12-08 MED ORDER — OXYCODONE HCL 5 MG PO TABS: 5.0000 mg | ORAL_TABLET | ORAL | Status: DC | PRN

## 2016-12-08 SURGICAL SUPPLY — 61 items
ADAPTER FEMORAL BOLT NEUTRAL (Knees) ×3 IMPLANT
BAG ZIPLOCK 12X15 (MISCELLANEOUS) ×3 IMPLANT
BANDAGE ACE 6X5 VEL STRL LF (GAUZE/BANDAGES/DRESSINGS) ×3 IMPLANT
BANDAGE ESMARK 6X9 LF (GAUZE/BANDAGES/DRESSINGS) ×1 IMPLANT
BLADE SAG 18X100X1.27 (BLADE) ×3 IMPLANT
BLADE SAW SGTL 11.0X1.19X90.0M (BLADE) ×3 IMPLANT
BNDG ESMARK 6X9 LF (GAUZE/BANDAGES/DRESSINGS) ×3
BONE CEMENT GENTAMICIN (Cement) ×6 IMPLANT
CEMENT BONE GENTAMICIN 40 (Cement) ×2 IMPLANT
COMP FEM CEM LT SZ4 (Orthopedic Implant) ×3 IMPLANT
COMPONENT FEM CEM LT SZ4 (Orthopedic Implant) ×1 IMPLANT
COVER SURGICAL LIGHT HANDLE (MISCELLANEOUS) ×3 IMPLANT
CUFF TOURN SGL QUICK 34 (TOURNIQUET CUFF) ×2
CUFF TRNQT CYL 34X4X40X1 (TOURNIQUET CUFF) ×1 IMPLANT
DRAPE EXTREMITY T 121X128X90 (DRAPE) ×3 IMPLANT
DRAPE POUCH INSTRU U-SHP 10X18 (DRAPES) ×3 IMPLANT
DRAPE U-SHAPE 47X51 STRL (DRAPES) ×3 IMPLANT
DRSG ADAPTIC 3X8 NADH LF (GAUZE/BANDAGES/DRESSINGS) ×3 IMPLANT
DRSG PAD ABDOMINAL 8X10 ST (GAUZE/BANDAGES/DRESSINGS) ×3 IMPLANT
DURAPREP 26ML APPLICATOR (WOUND CARE) ×3 IMPLANT
ELECT REM PT RETURN 15FT ADLT (MISCELLANEOUS) ×3 IMPLANT
EVACUATOR 1/8 PVC DRAIN (DRAIN) ×3 IMPLANT
FACESHIELD WRAPAROUND (MASK) ×15 IMPLANT
FEMORAL ADAPTER (Orthopedic Implant) ×3 IMPLANT
GAUZE SPONGE 4X4 12PLY STRL (GAUZE/BANDAGES/DRESSINGS) ×3 IMPLANT
GLOVE BIO SURGEON STRL SZ 6.5 (GLOVE) ×2 IMPLANT
GLOVE BIO SURGEON STRL SZ7.5 (GLOVE) ×6 IMPLANT
GLOVE BIO SURGEON STRL SZ8 (GLOVE) ×3 IMPLANT
GLOVE BIO SURGEONS STRL SZ 6.5 (GLOVE) ×1
GLOVE BIOGEL PI IND STRL 6.5 (GLOVE) ×1 IMPLANT
GLOVE BIOGEL PI IND STRL 7.5 (GLOVE) ×5 IMPLANT
GLOVE BIOGEL PI IND STRL 8 (GLOVE) ×3 IMPLANT
GLOVE BIOGEL PI INDICATOR 6.5 (GLOVE) ×2
GLOVE BIOGEL PI INDICATOR 7.5 (GLOVE) ×10
GLOVE BIOGEL PI INDICATOR 8 (GLOVE) ×6
GOWN STRL REUS W/TWL LRG LVL3 (GOWN DISPOSABLE) ×3 IMPLANT
GOWN STRL REUS W/TWL XL LVL3 (GOWN DISPOSABLE) ×12 IMPLANT
HANDPIECE INTERPULSE COAX TIP (DISPOSABLE) ×2
IMMOBILIZER KNEE 20 (SOFTGOODS) ×3
IMMOBILIZER KNEE 20 THIGH 36 (SOFTGOODS) ×1 IMPLANT
INSERT TIBIAL TC3 SZ 4 12.5 (Knees) ×3 IMPLANT
KIT BASIN OR (CUSTOM PROCEDURE TRAY) ×3 IMPLANT
MANIFOLD NEPTUNE II (INSTRUMENTS) ×3 IMPLANT
PACK TOTAL JOINT (CUSTOM PROCEDURE TRAY) ×3 IMPLANT
PAD ABD 8X10 STRL (GAUZE/BANDAGES/DRESSINGS) ×3 IMPLANT
PADDING CAST COTTON 6X4 STRL (CAST SUPPLIES) ×3 IMPLANT
POSITIONER SURGICAL ARM (MISCELLANEOUS) ×3 IMPLANT
SET HNDPC FAN SPRY TIP SCT (DISPOSABLE) ×1 IMPLANT
STAPLER VISISTAT 35W (STAPLE) ×3 IMPLANT
STEM UNIVERSAL REVISION 75X18 (Stem) ×3 IMPLANT
SUCTION FRAZIER HANDLE 12FR (TUBING) ×2
SUCTION TUBE FRAZIER 12FR DISP (TUBING) ×1 IMPLANT
SUT PDS AB 1 CT1 27 (SUTURE) ×9 IMPLANT
SUT VIC AB 2-0 CT1 27 (SUTURE) ×6
SUT VIC AB 2-0 CT1 TAPERPNT 27 (SUTURE) ×3 IMPLANT
SWAB COLLECTION DEVICE MRSA (MISCELLANEOUS) ×3 IMPLANT
SWAB CULTURE ESWAB REG 1ML (MISCELLANEOUS) ×3 IMPLANT
TOWEL OR 17X26 10 PK STRL BLUE (TOWEL DISPOSABLE) ×6 IMPLANT
TOWER CARTRIDGE SMART MIX (DISPOSABLE) ×3 IMPLANT
TRAY FOLEY W/METER SILVER 16FR (SET/KITS/TRAYS/PACK) ×3 IMPLANT
WRAP KNEE MAXI GEL POST OP (GAUZE/BANDAGES/DRESSINGS) ×3 IMPLANT

## 2016-12-08 NOTE — Transfer of Care (Signed)
Immediate Anesthesia Transfer of Care Note  Patient: [name] MelenaGary P Dyer  Procedure(s) Performed: LEFT KNEE FEMORAL REVISION (Left Knee)  Patient Location: PACU  Anesthesia Type:General  Level of Consciousness: sedated  Airway & Oxygen Therapy: Patient Spontanous Breathing and Patient connected to face mask oxygen  Post-op Assessment: Report given to RN and Post -op Vital signs reviewed and stable  Post vital signs: Reviewed and stable  Last Vitals:  Vitals:   12/08/16 0944 12/08/16 0958  BP: 104/72 115/63  Pulse: 64 (!) 59  Resp: 14 17  Temp:    SpO2: 97% 98%    Last Pain:  Vitals:   12/08/16 0850  TempSrc:   PainSc: 1          Complications: No apparent anesthesia complications

## 2016-12-08 NOTE — Op Note (Signed)
NAMQuentin Dyer:  Dyer, [name]                ACCOUNT NO.:  [id number]  MEDICAL RECORD NO.:  [id number]  LOCATION:                                 FACILITY:  PHYSICIAN:  [name], M.D.         DATE OF BIRTH:  DATE OF PROCEDURE:  12/08/2016 DATE OF DISCHARGE:                              OPERATIVE REPORT   PREOPERATIVE DIAGNOSIS:  Failed left total knee arthroplasty.  POSTOPERATIVE DIAGNOSIS:  Failed left total knee arthroplasty.  PROCEDURE:  Left knee femoral revision.  SURGEON:  [name], M.D.  ASSISTANT:  [name] L. Perkins, PA-C.  ANESTHESIA:  General with adductor canal block.  ESTIMATED BLOOD LOSS:  Minimal.  DRAINS:  Hemovac x1.  TOURNIQUET TIME:  65 minutes at 300 mmHg.  COMPLICATIONS:  None.  CONDITION:  Stable to recovery.  BRIEF CLINICAL NOTE:  Mr. [name] is a 76 year old male, who has had a very complex set of issues with his left knee.  He had an unicompartment replacement, was doing well until he had an accident with a tibial plateau fracture.  He went on to a total knee arthroplasty, but has had a persistent flexion contracture despite adequate physical therapy and bracing.  Due to the flexion contracture and lack of response to standard treatment, he presents now for femoral revision to increase the extension space and allow him to get back to full extension.  PROCEDURE IN DETAIL:  After successful administration of adductor canal block and general anesthetic, a tourniquet was placed on his left thigh and left lower extremity prepped and draped in the usual sterile fashion.  Extremities wrapped in Esmarch and tourniquet inflated to 300 mmHg.  Midline incision was made with a 10 blade through subcutaneous tissue to the level of the extensor mechanism.  A fresh blade was used to make a medial arthrotomy.  Soft tissue over the proximal medial tibia subperiosteally elevated to the joint line with a knife and into the semimembranosus bursa with a Cobb  elevator.  Soft tissue laterally was elevated with attention being paid to avoiding the patellar tendon on tibial tubercle.  I had to do a quadriceps snip in order to evert the patella.  Patella was then everted.  I then removed the tibial polyethylene from the tibial tray.  The tray was in excellent position and was well fixed.  He had approximately 20-degree flexion contracture prior to this procedure.  I decided to remove the femoral component, which was well fixed.  The osteotomes were used to disrupt the interface between the femoral component and bone.  Components removed with minimal to no bone loss.  We gained access to the femoral canal and thoroughly irrigated it.  I reamed up to 18 mm, which had an excellent press-fit. The 18 mm reamer was left in to serve as our intramedullary cutting guide.  Given a 20+ degree flexion contracture, I felt I needed to remove 6 to 8 mm off the distal femur to open the gap enough to get full extension.  Approximately 7 mm was removed.  The distal femoral cutting block was placed in 5 degrees of valgus.  I pinned it to remove 7 mm  and resection was made with an oscillating saw.  The tibial polyethylene that we removed was 12.5 mm.  We did not change the flexion space and thus, I placed a 12.5 mm spacer and in extension, the extension space was symmetric and allowed for achieving full extension, thus our distal femoral resection was adequate.  The size 4 was the appropriate femur and a size 4 cutting block was placed.  Rotation was marked off the epicondylar axis and confirmed by creating rectangular flexion gap at 90 degrees.  Block was pinned in this position.  We did remove some bone on chamfer cuts, but essentially, no bone anterior and posterior.  The intercondylar block was placed and the intercondylar cut then made. Trial femur was placed which was a size 4 with a 16 x 80 stem extension in a neutral position in 5 degrees of valgus.  Trial was  placed and with a 12.5 insert, full extension was achieved with excellent varus, valgus, and anterior-posterior balance throughout full range of motion.  We then removed the trial.  The permanent implant was constructed on the back table.  The knee was copiously irrigated with saline solution with pulsatile lavage.  Cement was mixed, which was 2 batches of gentamicin impregnated cement.  The components cemented distally with a press-fit stem.  It was impacted and 12.5 insert trial was placed.  Knee was held in full extension and all extruded cement removed.  When the cement was hardened, then the permanent 12.5 mm TC3 rotating platform insert was placed into the tibial tray.  The knee was reduced with outstanding stability throughout full range of motion.  Full extension was achieved as stated.  Wound was then copiously irrigated with saline solution and the quad snip closed with interrupted #1 PDS suture.  Hemovac drain was placed.  The arthrotomy was closed over Hemovac drain with a running #1 Stratafix suture.  Flexion against gravity was 135 degrees.  Tourniquet was released for a total time of 65 minutes.  Subcu was then closed with interrupted 2-0 Vicryl and skin with staples.  The incision was cleaned and dried and a bulky sterile dressing applied.  The drain was hooked to suction.  Please note that prior to closure, a total of 20 mL of Exparel mixed with 60 mL of saline was injected into the extensor mechanism, the periosteum of the femur, the posterior tissues, and the subcu.  This was all done prior to closure of the arthrotomy.  When everything was closed, a bulky sterile dressing was applied.  He was placed into a knee immobilizer, awakened and transported to recovery in stable condition.  Note that, a surgical assistant was a medical necessity for this procedure to do it in a safe and expeditious manner.  Surgical assistant was necessary for retraction of vital ligaments and  neurovascular structures and for proper positioning of the limb for safe removal of the old prosthesis and safe and accurate placement of the new prosthesis.     [name] Gross, M.D.     FA/MEDQ  D:  12/08/2016  T:  12/08/2016  Job:  [id number]

## 2016-12-08 NOTE — Anesthesia Procedure Notes (Addendum)
Anesthesia Regional Block: Adductor canal block   Pre-Anesthetic Checklist: ,, timeout performed, Correct Patient, Correct Site, Correct Laterality, Correct Procedure, Correct Position, site marked, Risks and benefits discussed,  Surgical consent,  Pre-op evaluation,  At surgeon's request and post-op pain management  Laterality: Left  Prep: chloraprep       Needles:  Injection technique: Single-shot  Needle Type: Stimulator Needle - 80          Additional Needles:   Procedures: Doppler guided,,,, ultrasound used (permanent image in chart),,,,  Narrative:  Start time: 12/08/2016 9:20 AM End time: 12/08/2016 9:35 AM Injection made incrementally with aspirations every 5 mL.  Performed by: Personally  Anesthesiologist: Dorris SinghGREEN, Naeemah Jasmer

## 2016-12-08 NOTE — Anesthesia Procedure Notes (Signed)
Procedure Name: Intubation Date/Time: 12/08/2016 10:30 AM Performed by: Lind Covert Pre-anesthesia Checklist: Patient identified, Emergency Drugs available, Suction available, Patient being monitored and Timeout performed Patient Re-evaluated:Patient Re-evaluated prior to induction Oxygen Delivery Method: Circle system utilized Preoxygenation: Pre-oxygenation with 100% oxygen Induction Type: IV induction Laryngoscope Size: Mac, 4 and Glidescope Grade View: Grade I Tube type: Oral Tube size: 7.5 mm Number of attempts: 1 Airway Equipment and Method: Stylet Placement Confirmation: ETT inserted through vocal cords under direct vision,  positive ETCO2 and breath sounds checked- equal and bilateral Secured at: 23 cm Tube secured with: Tape Dental Injury: Teeth and Oropharynx as per pre-operative assessment  Difficulty Due To: Difficulty was anticipated

## 2016-12-08 NOTE — Progress Notes (Signed)
AssistedDr. Edwards with left, ultrasound guided, adductor canal block. Side rails up, monitors on throughout procedure. See vital signs in flow sheet. Tolerated Procedure well.  

## 2016-12-08 NOTE — Anesthesia Postprocedure Evaluation (Signed)
Anesthesia Post Note  Patient: [name] MelenaGary P Dyer  Procedure(s) Performed: LEFT KNEE FEMORAL REVISION (Left Knee)     Patient location during evaluation: PACU Anesthesia Type: General Level of consciousness: awake and alert Pain management: pain level controlled Vital Signs Assessment: post-procedure vital signs reviewed and stable Respiratory status: spontaneous breathing, nonlabored ventilation, respiratory function stable and patient connected to nasal cannula oxygen Cardiovascular status: blood pressure returned to baseline and stable Postop Assessment: no apparent nausea or vomiting Anesthetic complications: no    Last Vitals:  Vitals:   12/08/16 1345 12/08/16 1400  BP: 135/84 (!) 143/83  Pulse: 78 78  Resp: 11 17  Temp:  36.4 C  SpO2: 99% 99%    Last Pain:  Vitals:   12/08/16 1415  TempSrc:   PainSc: 6                  Jaidynn Balster S

## 2016-12-08 NOTE — Discharge Instructions (Addendum)

## 2016-12-08 NOTE — Interval H&P Note (Signed)
History and Physical Interval Note:  12/08/2016 8:43 AM  [name] MelenaGary P Maione  has presented today for surgery, with the diagnosis of Left knee failed total knee arthroplasty  The various methods of treatment have been discussed with the patient and family. After consideration of risks, benefits and other options for treatment, the patient has consented to  Procedure(s): LEFT FEMORAL REVISION (Left) as a surgical intervention .  The patient's history has been reviewed, patient examined, no change in status, stable for surgery.  I have reviewed the patient's chart and labs.  Questions were answered to the patient's satisfaction.     Loanne DrillingALUISIO,Wirt Hemmerich V

## 2016-12-08 NOTE — Anesthesia Preprocedure Evaluation (Addendum)
Anesthesia Evaluation  Patient identified by MRN, date of birth, ID band Patient awake    Reviewed: Allergy & Precautions, NPO status , Patient's Chart, lab work & pertinent test results  Airway Mallampati: II  TM Distance: >3 FB     Dental   Pulmonary sleep apnea , pneumonia, former smoker,    breath sounds clear to auscultation       Cardiovascular negative cardio ROS   Rhythm:Regular Rate:Normal     Neuro/Psych    GI/Hepatic Neg liver ROS, GERD  ,  Endo/Other  diabetes  Renal/GU negative Renal ROS     Musculoskeletal  (+) Arthritis ,   Abdominal   Peds  Hematology   Anesthesia Other Findings   Reproductive/Obstetrics                             Anesthesia Physical Anesthesia Plan  ASA: III  Anesthesia Plan: General   Post-op Pain Management:  Regional for Post-op pain   Induction: Intravenous  PONV Risk Score and Plan: 2 and Ondansetron, Dexamethasone, Treatment may vary due to age or medical condition and Midazolam  Airway Management Planned: Oral ETT and Video Laryngoscope Planned  Additional Equipment:   Intra-op Plan:   Post-operative Plan: Extubation in OR  Informed Consent: I have reviewed the patients History and Physical, chart, labs and discussed the procedure including the risks, benefits and alternatives for the proposed anesthesia with the patient or authorized representative who has indicated his/her understanding and acceptance.   Dental advisory given  Plan Discussed with: CRNA and Anesthesiologist  Anesthesia Plan Comments:        Anesthesia Quick Evaluation

## 2016-12-08 NOTE — Brief Op Note (Signed)
12/08/2016  12:35 PM  PATIENT:  Ronn MelenaGary P Ausborn  76 y.o. male  PRE-OPERATIVE DIAGNOSIS:  Left knee failed total knee arthroplasty  POST-OPERATIVE DIAGNOSIS:  Left knee failed total knee arthroplasty  PROCEDURE:  Procedure(s) with comments: LEFT KNEE FEMORAL REVISION (Left) - Adductor Block  SURGEON:  Surgeon(s) and Role:    Ollen Gross* Maeson Lourenco, MD - Primary  PHYSICIAN ASSISTANT:   ASSISTANTS: Avel Peacerew Perkins, PA-C   ANESTHESIA:   general and adductor canal block  EBL:  20 mL   BLOOD ADMINISTERED:none  DRAINS: (Medium) Hemovact drain(s) in the left knee with  Suction Open   LOCAL MEDICATIONS USED:  OTHER Exparel  COUNTS:  YES  TOURNIQUET:   Total Tourniquet Time Documented: Thigh (Left) - 65 minutes Total: Thigh (Left) - 65 minutes   DICTATION: .Other Dictation: Dictation Number [id number]  PLAN OF CARE: Admit to inpatient   PATIENT DISPOSITION:  Stable to PACU

## 2016-12-08 NOTE — H&P (View-Only) (Signed)
[name] DOB: 04-11-1940 Married / Language: [place] / Race: White Male Date of admission: December 08, 2016  Chief complaint: failed left total knee History of Present Illness  The patient is a 76 year old male who comes in  for a preoperative History and Physical. The patient is scheduled for a left femoral revision to be performed by Dr. Gus RankinFrank V. Aluisio[facility]. The patient was seen post left total knee arthroplasty and s/p manipulation. The patient states that he is not doing well at this time. The pain is under poor control at this time and describe their pain as moderate. They are currently on Oxycodone for their pain. The patient is currently doing outpatient physical therapy which has not been helping. Unfortunately he is not progressing at all in regards to the knee extension. He is satisfied with the knee flexion. Walking with the flexion contractures causing pain in his lower back and hip. 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 He feels like physical therapy has not helped anymore. He is very discouraged with how he is currently doing. Unfortunately he has a bad problem with this knee with the flexion contracture. He has worked really hard to try and get the extension back including using a JAS brace but it is not working for him. I would recommend surgery in the form of a femoral revision to elevate the joint line and increase his extension space to try and allow him to get more extension. I have told him that anything short of that would not work. There is no guarantee with this proposed surgery that he will get full extension but I do believe that is the only chance he has to obtain that. We did discuss the procedure, risks, potential complications and rehab course in detail and he wants to proceed with femoral revision. Risks and benefits were disucssed and the patient wishes to proceed.  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)  Pes anserine bursitis (M70.50)  Hypercholesterolemia  Sleep Apnea  uses CPAP Diabetes Mellitus, Type II  Irritable bowel syndrome  Gastroesophageal Reflux Disease  Closed fracture of left tibial plateau, with routine healing, subsequent encounter (S82.142D)  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)  Atrial Fibrillation  Hypothyroidism  Enlarged prostate   Allergies No Known Drug Allergies   Family History Diabetes Mellitus  brother and grandfather mothers side Cancer  mother, father and brother  Social History Marital status  married Exercise  Exercises weekly; does gym / [employer] Living situation  live with spouse Tobacco use  former smoker; smoke(d) 1 pack(s) per day Pain Contract  no Illicit drug use  no Copy of Drug/Alcohol Rehab (Previously)  no Alcohol use  current drinker; drinks beer and wine; only occasionally per week Drug/Alcohol Rehab (Currently)  no Number of flights of stairs before winded  1 Current work status  working part time Children  5 or more Tobacco / smoke exposure  no  Medication History Pioglitazone HCl-Metformin HCl (15-500MG  Tablet, 1 Oral bid) Active. PriLOSEC (20MG  Capsule DR, 1 Oral daily) Active. Centrum Silver Ultra Mens (Oral) Specific strength unknown - Active. Citrucel (Oral two times daily) Active. Glucosamine / Chondroitin  Active. Fish Oil (1200 two times daily) Active. Atorvastatin Active. Vitamin D3 Active. Finasteride (5MG  Tablet, Oral) Active. (qd) Saw Palmetto (540mg  Daily) Active. Biotin 5000 (5MG  Capsule, Oral)  Active. (qd) Stool Softener (100MG  Capsule, Oral) Active. (qd) Doxycycline Hyclate (100MG  Tablet, Oral) Active. Vitamin B12 TR ( Tablet ER, 5 Oral daily) Active. OxyCODONE HCl (5MG  Tablet, 1 (one) Oral po q6 prn pain, Taken starting 10/28/2016) Active. Eliquis (5MG  Tablet, Oral) Active. Metoprolol Tartrate (25MG  Tablet, Oral) Active. Levothroid (Oral) Specific strength unknown - Active.  Past Surgical History  Rotator Cuff Repair  bilateral Total Knee Replacement  right Arthroscopy of Shoulder  bilateral Neck Surgery  acdf [id number] Resection of Stomach  Hemorrhoidectomy  Straighten Nasal Septum  Back Surgery  times three    Review of Systems General Not Present- Chills, Fatigue, Fever, Memory Loss, Night Sweats, Weight Gain and Weight Loss. Skin Not Present- Eczema, Hives, Itching, Lesions and Rash. HEENT Not Present- Dentures, Double Vision, Headache, Hearing Loss, Tinnitus and Visual Loss. Respiratory Not Present- Allergies, Chronic Cough, Coughing up blood, Shortness of breath at rest and Shortness of breath with exertion. Cardiovascular Not Present- Chest Pain, Difficulty Breathing Lying Down, Murmur, Palpitations, Racing/skipping heartbeats and Swelling. Gastrointestinal Not Present- Abdominal Pain, Bloody Stool, Constipation, Diarrhea, Difficulty Swallowing, Heartburn, Jaundice, Loss of appetitie, Nausea and Vomiting. Male Genitourinary Not Present- Blood in Urine, Discharge, Flank Pain, Incontinence, Painful Urination, Urgency, Urinary frequency, Urinary Retention, Urinating at Night and Weak urinary stream. Musculoskeletal Present- Joint Stiffness. Not Present- Back Pain, Joint Pain, Joint Swelling, Morning Stiffness, Muscle Pain, Muscle Weakness and Spasms. Neurological Not Present- Blackout spells, Difficulty with balance, Dizziness, Paralysis, Tremor and Weakness. Psychiatric Not Present- Insomnia.  Vitals Height: 67.5in Height was reported by  patient. Pulse: 60 (Regular)  BP: 118/68 (Sitting, Right Arm, Standard)       Physical Exam  General Mental Status -Alert, cooperative and good historian. General Appearance-pleasant, Not in acute distress. Orientation-Oriented X3. Build & Nutrition-Well nourished and Well developed.  Head and Neck Head-normocephalic, atraumatic . Neck Global Assessment - supple, no bruit auscultated on the right, no bruit auscultated on the left.  Eye Vision-Wears corrective lenses. Pupil - Bilateral-Regular and Round. Motion - Bilateral-EOMI.  Chest and Lung Exam Auscultation Breath sounds - clear at anterior chest wall and clear at posterior chest wall. Adventitious sounds - No Adventitious sounds.  Cardiovascular Auscultation Rhythm - Regular rate and rhythm. Heart Sounds - S1 WNL and S2 WNL. Murmurs & Other Heart Sounds - Auscultation of the heart reveals - No Murmurs.  Abdomen Palpation/Percussion Tenderness - Abdomen is non-tender to palpation. Rigidity (guarding) - Abdomen is soft. Auscultation Auscultation of the abdomen reveals - Bowel sounds normal.  Male Genitourinary Note: Not done, not pertinent to present illness   Musculoskeletal Note: Evaluation of his LEFT knee shows no warmth or effusion. His range of motion is 20-105 actively and 110 passively. He does not have any instability about the knee. He is walking with a significantly antalgic gait pattern.   Assessment & Plan  Arthrofibrosis of knee joint, left (H84.696) Aftercare following left knee joint replacement surgery (Z47.1, E95.284)  Note:Surgical Plans: Left Femoral Knee Revision  Disposition: Home with HHPT, [facility]  PCP: Dr. Pearson Grippe  IV TXA  Anesthesia Issues: None  Patient was instructed on what medications to stop prior to surgery. .  Signed electronically by Lauraine Rinne, III PA-C

## 2016-12-09 LAB — BASIC METABOLIC PANEL
Anion gap: 8 (ref 5–15)
BUN: 20 mg/dL (ref 6–20)
CO2: 24 mmol/L (ref 22–32)
Calcium: 8.4 mg/dL — ABNORMAL LOW (ref 8.9–10.3)
Chloride: 106 mmol/L (ref 101–111)
Creatinine, Ser: 0.98 mg/dL (ref 0.61–1.24)
GFR calc Af Amer: >60 mL/min (ref >60)
GFR calc non Af Amer: >60 mL/min (ref >60)
Glucose, Bld: 287 mg/dL — ABNORMAL HIGH (ref 65–99)
Potassium: 4.2 mmol/L (ref 3.5–5.1)
Sodium: 138 mmol/L (ref 135–145)

## 2016-12-09 LAB — CBC
HCT: 36.2 % — ABNORMAL LOW (ref 39.0–52.0)
Hemoglobin: 12.4 g/dL — ABNORMAL LOW (ref 13.0–17.0)
MCH: 33.6 pg (ref 26.0–34.0)
MCHC: 34.3 g/dL (ref 30.0–36.0)
MCV: 98.1 fL (ref 78.0–100.0)
Platelets: 160 10*3/uL (ref 150–400)
RBC: 3.69 MIL/uL — ABNORMAL LOW (ref 4.22–5.81)
RDW: 13 % (ref 11.5–15.5)
WBC: 12.1 10*3/uL — ABNORMAL HIGH (ref 4.0–10.5)

## 2016-12-09 LAB — GLUCOSE, CAPILLARY
Glucose-Capillary: 276 mg/dL — ABNORMAL HIGH (ref 65–99)
Glucose-Capillary: 196 mg/dL — ABNORMAL HIGH (ref 65–99)

## 2016-12-09 MED ORDER — OMEPRAZOLE 20 MG PO CPDR
20.0000 mg | DELAYED_RELEASE_CAPSULE | Freq: Every day | ORAL | Status: DC
  Administered 2016-12-09: 20 mg via ORAL
  Filled 2016-12-09: qty 1

## 2016-12-09 MED ORDER — OXYCODONE HCL 5 MG PO TABS: 5.0000 mg | ORAL_TABLET | ORAL | 0 refills | Status: DC | PRN

## 2016-12-09 MED ORDER — METHOCARBAMOL 500 MG PO TABS: 500.0000 mg | ORAL_TABLET | Freq: Four times a day (QID) | ORAL | 0 refills | Status: DC | PRN

## 2016-12-09 MED ORDER — NON FORMULARY: 20.0000 mg | Freq: Every day | Status: DC

## 2016-12-09 NOTE — Evaluation (Signed)
Occupational Therapy Evaluation Patient Details Name: [name] MRN: [id number] DOB: September 08, 1940 Today's Date: 12/09/2016    History of Present Illness s/p L TKA, h/o back, knee and shoulder sxs   Clinical Impression   This 76 year old man was admitted for the above sx. All education was completed. No further OT is needed at this time    Follow Up Recommendations  No OT follow up    Equipment Recommendations  None recommended by OT    Recommendations for Other Services       Precautions / Restrictions Precautions Precautions: Fall;Knee Required Braces or Orthoses: Knee Immobilizer - Left Restrictions Weight Bearing Restrictions: No      Mobility Bed Mobility Overal bed mobility: Needs Assistance Bed Mobility: Supine to Sit;Sit to Supine     Supine to sit: Min assist Sit to supine: Min assist   General bed mobility comments: assist for LLE  Transfers Overall transfer level: Needs assistance Equipment used: Rolling walker (2 wheeled) Transfers: Sit to/from Stand Sit to Stand: Min guard         General transfer comment: for safety    Balance                                           ADL either performed or assessed with clinical judgement   ADL Overall ADL's : Needs assistance/impaired Eating/Feeding: Independent   Grooming: Set up;Sitting   Upper Body Bathing: Set up;Sitting   Lower Body Bathing: Moderate assistance;Sit to/from stand   Upper Body Dressing : Set up;Sitting   Lower Body Dressing: Moderate assistance;Sit to/from stand                 General ADL Comments: completed adl and sidestepped up HOB. Pt verbalizes sequence for shower and didn't feel he needed to practice this nor toilet transfer. Reviewed knee precautions. Pt does need KI     Vision         Perception     Praxis      Pertinent Vitals/Pain Pain Assessment: 0-10 Pain Score: 2  Pain Location: L knee Pain Descriptors / Indicators:  Sore Pain Intervention(s): Limited activity within patient's tolerance;Monitored during session;Premedicated before session;Repositioned;Ice applied     Hand Dominance     Extremity/Trunk Assessment Upper Extremity Assessment Upper Extremity Assessment: Overall WFL for tasks assessed           Communication Communication Communication: No difficulties   Cognition Arousal/Alertness: Awake/alert Behavior During Therapy: WFL for tasks assessed/performed Overall Cognitive Status: Within Functional Limits for tasks assessed                                     General Comments       Exercises     Shoulder Instructions      Home Living Family/patient expects to be discharged to:: Private residence Living Arrangements: Spouse/significant other Available Help at Discharge: Family;Available 24 hours/day               Bathroom Shower/Tub: Producer, television/film/video: Handicapped height     Home Equipment: Environmental consultant - 2 wheels;Bedside commode;Grab bars - tub/shower;Shower seat - built in;Crutches;Cane - single point          Prior Functioning/Environment Level of Independence: Independent with assistive device(s)  Comments: wife can assist as needed.  Was able to bring LLE up onto bed to don sock prior to sx        OT Problem List:        OT Treatment/Interventions:      OT Goals(Current goals can be found in the care plan section) Acute Rehab OT Goals Patient Stated Goal: return to independence OT Goal Formulation: All assessment and education complete, DC therapy  OT Frequency:     Barriers to D/C:            Co-evaluation              AM-PAC PT "6 Clicks" Daily Activity     Outcome Measure Help from another person eating meals?: None Help from another person taking care of personal grooming?: A Little Help from another person toileting, which includes using toliet, bedpan, or urinal?: A Little Help from another  person bathing (including washing, rinsing, drying)?: A Lot Help from another person to put on and taking off regular upper body clothing?: A Little Help from another person to put on and taking off regular lower body clothing?: A Lot 6 Click Score: 17   End of Session    Activity Tolerance: Patient tolerated treatment well Patient left: in bed;with call bell/phone within reach  OT Visit Diagnosis: Pain Pain - Right/Left: Left Pain - part of body: Knee                Time: [id number] OT Time Calculation (min): 14 min Charges:  OT General Charges $OT Visit: 1 Visit OT Evaluation $OT Eval Low Complexity: 1 Low G-Codes:     HensleyMaryellen Gurley Climer, OTR/L [phone] 12/09/2016  Tersea Aulds 12/09/2016, 9:14 AM

## 2016-12-09 NOTE — Evaluation (Signed)
Physical Therapy Evaluation Patient Details Name: [name] MRN: [id number] DOB: Jun 20, 1940 Today's Date: 12/09/2016   History of Present Illness  s/p L TKA revision and with h/o back, knee and shoulder sxs  Clinical Impression  Pt s/p L TKR revision and presents with decreased L LE strength/ROM and post op pain limiting functional mobility.  Pt should progress to dc home with family assist.    Follow Up Recommendations Home health PT    Equipment Recommendations  Rolling walker with 5" wheels    Recommendations for Other Services       Precautions / Restrictions Precautions Precautions: Fall;Knee Required Braces or Orthoses: Knee Immobilizer - Left Knee Immobilizer - Left: Discontinue once straight leg raise with < 10 degree lag Restrictions Weight Bearing Restrictions: No Other Position/Activity Restrictions: WBAT      Mobility  Bed Mobility Overal bed mobility: Needs Assistance Bed Mobility: Supine to Sit     Supine to sit: Min assist     General bed mobility comments: assist for LLE  Transfers Overall transfer level: Needs assistance Equipment used: Rolling walker (2 wheeled) Transfers: Sit to/from Stand Sit to Stand: Min guard         General transfer comment: for safety  Ambulation/Gait Ambulation/Gait assistance: Min assist;Min guard Ambulation Distance (Feet): 90 Feet Assistive device: Rolling walker (2 wheeled) Gait Pattern/deviations: Step-to pattern;Decreased step length - right;Decreased step length - left;Shuffle;Trunk flexed Gait velocity: decr Gait velocity interpretation: Below normal speed for age/gender General Gait Details: cues for posture, position from RW and initial sequence  Stairs            Wheelchair Mobility    Modified Rankin (Stroke Patients Only)       Balance                                             Pertinent Vitals/Pain Pain Assessment: 0-10 Pain Score: 4  Pain Location: L  knee Pain Descriptors / Indicators: Sore Pain Intervention(s): Limited activity within patient's tolerance;Monitored during session;Premedicated before session;Ice applied    Home Living Family/patient expects to be discharged to:: Private residence Living Arrangements: Spouse/significant other Available Help at Discharge: Family;Available 24 hours/day Type of Home: House Home Access: Stairs to enter Entrance Stairs-Rails: None Entrance Stairs-Number of Steps: 1+1 Home Layout: One level Home Equipment: Walker - 2 wheels;Bedside commode;Grab bars - tub/shower;Shower seat - built in;Crutches;Cane - single point Additional Comments: has a grab and 1/2 wall to push up from: doesn't use 3:1 over toilet.  Needs a new shower seat    Prior Function Level of Independence: Independent with assistive device(s)         Comments: wife can assist as needed.  Was able to bring LLE up onto bed to don sock prior to sx     Hand Dominance   Dominant Hand: Right    Extremity/Trunk Assessment   Upper Extremity Assessment Upper Extremity Assessment: Overall WFL for tasks assessed    Lower Extremity Assessment Lower Extremity Assessment: LLE deficits/detail LLE Deficits / Details: 2/5 quads with AAROM at knee -12 - 60    Cervical / Trunk Assessment Cervical / Trunk Assessment: Normal  Communication   Communication: No difficulties  Cognition Arousal/Alertness: Awake/alert Behavior During Therapy: WFL for tasks assessed/performed Overall Cognitive Status: Within Functional Limits for tasks assessed  General Comments      Exercises Total Joint Exercises Ankle Circles/Pumps: AROM;Both;15 reps;Supine Quad Sets: AROM;Both;10 reps;Supine Heel Slides: AAROM;Left;15 reps;Supine Straight Leg Raises: AAROM;Left;10 reps;Supine   Assessment/Plan    PT Assessment Patient needs continued PT services  PT Problem List Decreased  strength;Decreased range of motion;Decreased activity tolerance;Decreased mobility;Decreased knowledge of use of DME;Pain       PT Treatment Interventions DME instruction;Gait training;Stair training;Functional mobility training;Therapeutic activities;Therapeutic exercise;Patient/family education    PT Goals (Current goals can be found in the Care Plan section)  Acute Rehab PT Goals Patient Stated Goal: return to independence PT Goal Formulation: With patient Time For Goal Achievement: 12/13/16 Potential to Achieve Goals: Good    Frequency 7X/week   Barriers to discharge        Co-evaluation               AM-PAC PT "6 Clicks" Daily Activity  Outcome Measure Difficulty turning over in bed (including adjusting bedclothes, sheets and blankets)?: A Lot Difficulty moving from lying on back to sitting on the side of the bed? : A Lot Difficulty sitting down on and standing up from a chair with arms (e.g., wheelchair, bedside commode, etc,.)?: A Lot Help needed moving to and from a bed to chair (including a wheelchair)?: A Little Help needed walking in hospital room?: A Little Help needed climbing 3-5 steps with a railing? : A Little 6 Click Score: 15    End of Session Equipment Utilized During Treatment: Gait belt;Left knee immobilizer Activity Tolerance: Patient tolerated treatment well Patient left: in chair;with call bell/phone within reach;with family/visitor present Nurse Communication: Mobility status PT Visit Diagnosis: Difficulty in walking, not elsewhere classified (R26.2)    Time: 1610-9604 PT Time Calculation (min) (ACUTE ONLY): 25 min   Charges:   PT Evaluation $PT Eval Low Complexity: 1 Low PT Treatments $Therapeutic Exercise: 8-22 mins   PT G Codes:        Pg [phone]   [name] 12/09/2016, 1:44 PM

## 2016-12-09 NOTE — Progress Notes (Signed)
Physical Therapy Treatment Patient Details Name: [name] MelenaGary P Vitrano MRN: [id number] DOB: 1940/03/14 Today's Date: 12/09/2016    History of Present Illness s/p L TKA revision and with h/o back, knee and shoulder sxs    PT Comments    Pt progressing well with mobility and eager for dc home.  Reviewed therex, stairs and don/doff KI.  Follow Up Recommendations  Home health PT     Equipment Recommendations  Rolling walker with 5" wheels    Recommendations for Other Services       Precautions / Restrictions Precautions Precautions: Fall;Knee Required Braces or Orthoses: Knee Immobilizer - Left Knee Immobilizer - Left: Discontinue once straight leg raise with < 10 degree lag Restrictions Weight Bearing Restrictions: No Other Position/Activity Restrictions: WBAT    Mobility  Bed Mobility Overal bed mobility: Needs Assistance Bed Mobility: Sit to Supine     Supine to sit: Min assist Sit to supine: Min guard;Min assist   General bed mobility comments: assist for LLE  Transfers Overall transfer level: Needs assistance Equipment used: Rolling walker (2 wheeled) Transfers: Sit to/from Stand Sit to Stand: Min guard;Supervision         General transfer comment: cues for LE management and use of UEs to self assist  Ambulation/Gait Ambulation/Gait assistance: Min guard Ambulation Distance (Feet): 147 Feet Assistive device: Rolling walker (2 wheeled) Gait Pattern/deviations: Step-to pattern;Decreased step length - right;Decreased step length - left;Shuffle;Trunk flexed Gait velocity: decr Gait velocity interpretation: Below normal speed for age/gender General Gait Details: cues for posture, position from RW and initial sequence   Stairs Stairs: Yes   Stair Management: No rails;Step to pattern;Forwards;With walker;Backwards Number of Stairs: 3 General stair comments: single step twice bkwd and once fwd with cues for sequence and foot/RW placement  Wheelchair Mobility     Modified Rankin (Stroke Patients Only)       Balance                                            Cognition Arousal/Alertness: Awake/alert Behavior During Therapy: WFL for tasks assessed/performed Overall Cognitive Status: Within Functional Limits for tasks assessed                                        Exercises Total Joint Exercises Ankle Circles/Pumps: AROM;Both;15 reps;Supine Quad Sets: AROM;Both;10 reps;Supine Heel Slides: AAROM;Left;15 reps;Supine Straight Leg Raises: AAROM;Left;10 reps;Supine    General Comments        Pertinent Vitals/Pain Pain Assessment: 0-10 Pain Score: 4  Pain Location: L knee Pain Descriptors / Indicators: Sore Pain Intervention(s): Limited activity within patient's tolerance;Monitored during session;Premedicated before session    Home Living Family/patient expects to be discharged to:: Private residence Living Arrangements: Spouse/significant other Available Help at Discharge: Family;Available 24 hours/day Type of Home: House Home Access: Stairs to enter Entrance Stairs-Rails: None Home Layout: One level Home Equipment: Environmental consultantWalker - 2 wheels;Bedside commode;Grab bars - tub/shower;Shower seat - built in;Crutches;Cane - single point Additional Comments: has a grab and 1/2 wall to push up from: doesn't use 3:1 over toilet.  Needs a new shower seat    Prior Function Level of Independence: Independent with assistive device(s)      Comments: wife can assist as needed.  Was able to bring LLE up onto bed to don sock  prior to sx   PT Goals (current goals can now be found in the care plan section) Acute Rehab PT Goals Patient Stated Goal: return to independence PT Goal Formulation: With patient Time For Goal Achievement: 12/13/16 Potential to Achieve Goals: Good Progress towards PT goals: Progressing toward goals    Frequency    7X/week      PT Plan Current plan remains appropriate     Co-evaluation              AM-PAC PT "6 Clicks" Daily Activity  Outcome Measure  Difficulty turning over in bed (including adjusting bedclothes, sheets and blankets)?: A Lot Difficulty moving from lying on back to sitting on the side of the bed? : A Lot Difficulty sitting down on and standing up from a chair with arms (e.g., wheelchair, bedside commode, etc,.)?: A Lot Help needed moving to and from a bed to chair (including a wheelchair)?: A Little Help needed walking in hospital room?: A Little Help needed climbing 3-5 steps with a railing? : A Little 6 Click Score: 15    End of Session Equipment Utilized During Treatment: Gait belt;Left knee immobilizer Activity Tolerance: Patient tolerated treatment well Patient left: with call bell/phone within reach;with family/visitor present;in bed Nurse Communication: Mobility status PT Visit Diagnosis: Difficulty in walking, not elsewhere classified (R26.2)     Time: 1610-9604 PT Time Calculation (min) (ACUTE ONLY): 25 min  Charges:  $Gait Training: 8-22 mins $Therapeutic Exercise: 8-22 mins                    G Codes:       Pg [phone]    Ricquel Foulk 12/09/2016, 1:50 PM

## 2016-12-09 NOTE — Care Management CC44 (Signed)
Condition Code 44 Documentation Completed  Patient Details  Name: [name] MRN: [id number] Date of Birth: Sep 06, 1940   Condition Code 44 given:  Yes Patient signature on Condition Code 44 notice:  Yes Documentation of 2 MD's agreement:  Yes Code 44 added to claim:  Yes    Alexis Goodelleele, Heydy Montilla K, RN 12/09/2016, 3:02 PM

## 2016-12-09 NOTE — Addendum Note (Signed)
Addendum  created 12/09/16 0636 by Elyn PeersAllen, Dodd Schmid J, CRNA   Charge Capture section accepted

## 2016-12-09 NOTE — Progress Notes (Signed)
Discharge planning, spoke with patient at bedside. Have chosen [facility] for HH PT. Contacted [facility] for referral. Has RW and 3n1. [phone] 

## 2016-12-09 NOTE — Progress Notes (Signed)
Subjective: 1 Day Post-Op Procedure(s) (LRB): LEFT KNEE FEMORAL REVISION (Left) Patient reports pain as mild.   Patient seen in rounds for Dr. Lequita HaltAluisio. Not much sleep last night but feels pretty good today. Patient is well, but has had some minor complaints of pain in the knee, requiring pain medications Patient is ready to go home after two sessions of therapy   Objective: Vital signs in last 24 hours: Temp:  [97.6 F (36.4 C)-98.4 F (36.9 C)] 98.2 F (36.8 C) (11/01 0106) Pulse Rate:  [55-91] 68 (11/01 0605) Resp:  [7-20] 18 (11/01 0605) BP: (100-143)/(53-86) 130/77 (11/01 0605) SpO2:  [93 %-100 %] 98 % (11/01 0605) Weight:  [97.1 kg (214 lb)] 97.1 kg (214 lb) (10/31 0850)  Intake/Output from previous day:  Intake/Output Summary (Last 24 hours) at 12/09/16 0735 Last data filed at 12/09/16 0605  Gross per 24 hour  Intake          2593.34 ml  Output             3780 ml  Net         -1186.66 ml    Intake/Output this shift: No intake/output data recorded.  Labs:  Recent Labs  12/09/16 0514  HGB 12.4*    Recent Labs  12/09/16 0514  WBC 12.1*  RBC 3.69*  HCT 36.2*  PLT 160    Recent Labs  12/09/16 0514  NA 138  K 4.2  CL 106  CO2 24  BUN 20  CREATININE 0.98  GLUCOSE 287*  CALCIUM 8.4*   No results for input(s): LABPT, INR in the last 72 hours.  EXAM: General - Patient is Alert, Appropriate and Oriented Extremity - Neurovascular intact Sensation intact distally Intact pulses distally Dorsiflexion/Plantar flexion intact Dressing - clean, dry, no drainage Motor Function - intact, moving foot and toes well on exam. HV pulled without difficulty  Assessment/Plan: 1 Day Post-Op Procedure(s) (LRB): LEFT KNEE FEMORAL REVISION (Left) Procedure(s) (LRB): LEFT KNEE FEMORAL REVISION (Left) Past Medical History:  Diagnosis Date  . 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 Barrett's esophagus with dysplasia   .  Chronic lower back pain    DDD  . Closed patellar sleeve fracture of left knee   . Constipation   . Crush injury knee    bilateral  . Dysphagia    EGD with dilitation  . Enlarged prostate    sees a urologist  . GERD (gastroesophageal reflux disease)    takes Omeprazole daily  . Hard of hearing   . Hyperlipidemia    takes Lipitor and Fish Oil daily  . Insomnia    d/t pain  . Joint pain   . Joint swelling   . Left tibial fracture   . Multiple duodenal ulcers   . Nocturia   . OSA on CPAP    cpap  . Pneumonia    "about 5 years ago"  . Type II diabetes mellitus (HCC)    type 2  . Unsteady gait   . Urinary frequency   . Wears glasses    Principal Problem:   Failed total knee arthroplasty (HCC)  Estimated body mass index is 33.52 kg/m as calculated from the following:   Height as of this encounter: 5\' 7"  (1.702 m).   Weight as of this encounter: 97.1 kg (214 lb). Up with therapy Discharge home with home health Diet - Diabetic diet Follow up - in 2 weeks Activity - WBAT  Disposition - Home Condition Upon Discharge - Stable D/C Meds - See DC Summary DVT Prophylaxis - eliquis  Avel Peace, PA-C Orthopaedic Surgery 12/09/2016, 7:35 AM

## 2016-12-09 NOTE — Discharge Summary (Signed)
Physician Discharge Summary   Patient ID: [name] MRN: [id number] DOB/AGE: Nov 10, 1940 76 y.o.  Admit date: 12/08/2016 Discharge date: 12-09-2016  Primary Diagnosis:  Failed left total knee arthroplasty.  Admission Diagnoses:  Past Medical History:  Diagnosis Date  . 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 Barrett's esophagus with dysplasia   . Chronic lower back pain    DDD  . Closed patellar sleeve fracture of left knee   . Constipation   . Crush injury knee    bilateral  . Dysphagia    EGD with dilitation  . Enlarged prostate    sees a urologist  . GERD (gastroesophageal reflux disease)    takes Omeprazole daily  . Hard of hearing   . Hyperlipidemia    takes Lipitor and Fish Oil daily  . Insomnia    d/t pain  . Joint pain   . Joint swelling   . Left tibial fracture   . Multiple duodenal ulcers   . Nocturia   . OSA on CPAP    cpap  . Pneumonia    "about 5 years ago"  . Type II diabetes mellitus ([place])    type 2  . Unsteady gait   . Urinary frequency   . Wears glasses    Discharge Diagnoses:   Principal Problem:   Failed total knee arthroplasty ([place])  Estimated body mass index is 33.52 kg/m as calculated from the following:   Height as of this encounter: 5' 7"  (1.702 m).   Weight as of this encounter: 97.1 kg (214 lb).  Procedure:  Procedure(s) (LRB): LEFT KNEE FEMORAL REVISION (Left)   Consults: None  HPI: [name] is a 76 year old male, who has had a very complex set of issues with his left knee.  He had an unicompartment replacement, was doing well until he had an accident with a tibial plateau fracture.  He went on to a total knee arthroplasty, but has had a persistent flexion contracture despite adequate physical therapy and bracing.  Due to the flexion contracture and lack of response to standard treatment, he presents now for femoral revision to increase the extension space and allow him to get  back to full extension.  Laboratory Data: Admission on 12/08/2016  Component Date Value Ref Range Status  . ABO/RH(D) 12/08/2016 A POS   Final  . Antibody Screen 12/08/2016 POS   Final  . Sample Expiration 12/08/2016 12/11/2016   Final  . DAT, IgG 12/08/2016 NEG   Final  . Unit Number 12/08/2016 [id number]   Final  . Blood Component Type 12/08/2016 RED CELLS,LR   Final  . Unit division 12/08/2016 00   Final  . Status of Unit 12/08/2016 ALLOCATED   Final  . Transfusion Status 12/08/2016 OK TO TRANSFUSE   Final  . Crossmatch Result 12/08/2016 COMPATIBLE   Final  . Glucose-Capillary 12/08/2016 215* 65 - 99 mg/dL Final  . Comment 1 12/08/2016 Notify RN   Final  . Blood Product Unit Number 12/08/2016 [id number]   Final  . Unit Type and Rh 12/08/2016 6200   Final  . Blood Product Expiration Date 12/08/2016 [id number]   Final  . Glucose-Capillary 12/08/2016 157* 65 - 99 mg/dL Final  . Comment 1 12/08/2016 Notify RN   Final  . Comment 2 12/08/2016 Document in Chart   Final  . WBC 12/09/2016 12.1* 4.0 - 10.5 K/uL Final  . RBC 12/09/2016 3.69* 4.22 - 5.81 MIL/uL Final  .  Hemoglobin 12/09/2016 12.4* 13.0 - 17.0 g/dL Final  . HCT 12/09/2016 36.2* 39.0 - 52.0 % Final  . MCV 12/09/2016 98.1  78.0 - 100.0 fL Final  . MCH 12/09/2016 33.6  26.0 - 34.0 pg Final  . MCHC 12/09/2016 34.3  30.0 - 36.0 g/dL Final  . RDW 12/09/2016 13.0  11.5 - 15.5 % Final  . Platelets 12/09/2016 160  150 - 400 K/uL Final  . Sodium 12/09/2016 138  135 - 145 mmol/L Final  . Potassium 12/09/2016 4.2  3.5 - 5.1 mmol/L Final  . Chloride 12/09/2016 106  101 - 111 mmol/L Final  . CO2 12/09/2016 24  22 - 32 mmol/L Final  . Glucose, Bld 12/09/2016 287* 65 - 99 mg/dL Final  . BUN 12/09/2016 20  6 - 20 mg/dL Final  . Creatinine, Ser 12/09/2016 0.98  0.61 - 1.24 mg/dL Final  . Calcium 12/09/2016 8.4* 8.9 - 10.3 mg/dL Final  . GFR calc non Af Amer 12/09/2016 >60  >60 mL/min Final  . GFR calc Af Amer 12/09/2016 >60  >60  mL/min Final   Comment: (NOTE) The eGFR has been calculated using the CKD EPI equation. This calculation has not been validated in all clinical situations. eGFR's persistently <60 mL/min signify possible Chronic Kidney Disease.   . Anion gap 12/09/2016 8  5 - 15 Final  . Glucose-Capillary 12/08/2016 217* 65 - 99 mg/dL Final  . Glucose-Capillary 12/08/2016 332* 65 - 99 mg/dL Final  . Glucose-Capillary 12/09/2016 276* 65 - 99 mg/dL Final  Hospital Outpatient Visit on 11/30/2016  Component Date Value Ref Range Status  . aPTT 11/30/2016 45* 24 - 36 seconds Final   Comment:        IF BASELINE aPTT IS ELEVATED, SUGGEST PATIENT RISK ASSESSMENT BE USED TO DETERMINE APPROPRIATE ANTICOAGULANT THERAPY.   . WBC 11/30/2016 6.1  4.0 - 10.5 K/uL Final  . RBC 11/30/2016 4.08* 4.22 - 5.81 MIL/uL Final  . Hemoglobin 11/30/2016 14.5  13.0 - 17.0 g/dL Final  . HCT 11/30/2016 40.4  39.0 - 52.0 % Final  . MCV 11/30/2016 99.0  78.0 - 100.0 fL Final  . MCH 11/30/2016 35.5* 26.0 - 34.0 pg Final  . MCHC 11/30/2016 35.9  30.0 - 36.0 g/dL Final  . RDW 11/30/2016 13.2  11.5 - 15.5 % Final  . Platelets 11/30/2016 207  150 - 400 K/uL Final  . Sodium 11/30/2016 141  135 - 145 mmol/L Final  . Potassium 11/30/2016 4.6  3.5 - 5.1 mmol/L Final  . Chloride 11/30/2016 106  101 - 111 mmol/L Final  . CO2 11/30/2016 25  22 - 32 mmol/L Final  . Glucose, Bld 11/30/2016 168* 65 - 99 mg/dL Final  . BUN 11/30/2016 15  6 - 20 mg/dL Final  . Creatinine, Ser 11/30/2016 0.84  0.61 - 1.24 mg/dL Final  . Calcium 11/30/2016 9.5  8.9 - 10.3 mg/dL Final  . Total Protein 11/30/2016 6.8  6.5 - 8.1 g/dL Final  . Albumin 11/30/2016 3.9  3.5 - 5.0 g/dL Final  . AST 11/30/2016 24  15 - 41 U/L Final  . ALT 11/30/2016 21  17 - 63 U/L Final  . Alkaline Phosphatase 11/30/2016 55  38 - 126 U/L Final  . Total Bilirubin 11/30/2016 1.6* 0.3 - 1.2 mg/dL Final  . GFR calc non Af Amer 11/30/2016 >60  >60 mL/min Final  . GFR calc Af Amer  11/30/2016 >60  >60 mL/min Final   Comment: (NOTE) The eGFR has been  calculated using the CKD EPI equation. This calculation has not been validated in all clinical situations. eGFR's persistently <60 mL/min signify possible Chronic Kidney Disease.   . Anion gap 11/30/2016 10  5 - 15 Final  . Prothrombin Time 11/30/2016 14.2  11.4 - 15.2 seconds Final  . INR 11/30/2016 1.11   Final  . ABO/RH(D) 11/30/2016 A POS   Final  . Antibody Screen 11/30/2016 POS   Final  . Sample Expiration 11/30/2016 12/03/2016   Final  . Antibody Identification 26/33/3545 NO CLINICALLY SIGNIFICANT ANTIBODY IDENTIFIED   Final  . MRSA, PCR 11/30/2016 NEGATIVE  NEGATIVE Final  . Staphylococcus aureus 11/30/2016 NEGATIVE  NEGATIVE Final   Comment: (NOTE) The Xpert SA Assay (FDA approved for NASAL specimens in patients 80 years of age and older), is one component of a comprehensive surveillance program. It is not intended to diagnose infection nor to guide or monitor treatment.   . Glucose-Capillary 11/30/2016 166* 65 - 99 mg/dL Final     X-Rays:No results found.  EKG: Orders placed or performed during the hospital encounter of 06/29/16  . EKG 12-Lead  . EKG 12-Lead     Hospital Course: [name] is a 75 y.o. who was admitted to [facility]. They were brought to the operating room on 12/08/2016 and underwent Procedure(s): [place].  Patient tolerated the procedure well and was later transferred to the recovery room and then to the orthopaedic floor for postoperative care.  They were given PO and IV analgesics for pain control following their surgery.  They were given 24 hours of postoperative antibiotics of  Anti-infectives    Start     Dose/Rate Route Frequency Ordered Stop   12/08/16 1700  ceFAZolin (ANCEF) IVPB 2g/100 mL premix     2 g 200 mL/hr over 30 Minutes Intravenous Every 6 hours 12/08/16 1451 12/09/16 0302   12/08/16 0817  ceFAZolin (ANCEF) 2-4 GM/100ML-% IVPB      Comments:  Bridget Hartshorn   : cabinet override      12/08/16 0817 12/08/16 1032   12/08/16 0812  ceFAZolin (ANCEF) IVPB 2g/100 mL premix     2 g 200 mL/hr over 30 Minutes Intravenous On call to O.R. 12/08/16 6256 12/08/16 1032     and started on DVT prophylaxis in the form of Eliquis.   PT and OT were ordered for total joint protocol.  Discharge planning consulted to help with postop disposition and equipment needs.  Patient had a good night on the evening of surgery.  They started to get up OOB with therapy on day one. Hemovac drain was pulled without difficulty.   Dressing was checked and was clean and dry. Patient was seen in rounds and was ready to go home after two sessions of therapy.   Discharge home with home health Diet - Diabetic diet Follow up - in 2 weeks Activity - WBAT Disposition - Home Condition Upon Discharge - Stable D/C Meds - See DC Summary DVT Prophylaxis - eliquis  Discharge Instructions    Call MD / Call 911    Complete by:  As directed    If you experience chest pain or shortness of breath, CALL 911 and be transported to the hospital emergency room.  If you develope a fever above 101 F, pus (white drainage) or increased drainage or redness at the wound, or calf pain, call your surgeon's office.   Change dressing    Complete by:  As directed  Change dressing daily with sterile 4 x 4 inch gauze dressing and apply TED hose. Do not submerge the incision under water.   Constipation Prevention    Complete by:  As directed    Drink plenty of fluids.  Prune juice may be helpful.  You may use a stool softener, such as Colace (over the counter) 100 mg twice a day.  Use MiraLax (over the counter) for constipation as needed.   Diet Carb Modified    Complete by:  As directed    Discharge instructions    Complete by:  As directed    Resume Eliquis at home.  Pick up stool softner and laxative for home use following surgery while on pain medications. Do not submerge  incision under water. Please use good hand washing techniques while changing dressing each day. May shower starting three days after surgery. Please use a clean towel to pat the incision dry following showers. Continue to use ice for pain and swelling after surgery. Do not use any lotions or creams on the incision until instructed by your surgeon.  Wear both TED hose on both legs during the day every day for three weeks, but may remove the TED hose at night at home.  Postoperative Constipation Protocol  Constipation - defined medically as fewer than three stools per week and severe constipation as less than one stool per week.  One of the most common issues patients have following surgery is constipation.  Even if you have a regular bowel pattern at home, your normal regimen is likely to be disrupted due to multiple reasons following surgery.  Combination of anesthesia, postoperative narcotics, change in appetite and fluid intake all can affect your bowels.  In order to avoid complications following surgery, here are some recommendations in order to help you during your recovery period.  Colace (docusate) - Pick up an over-the-counter form of Colace or another stool softener and take twice a day as long as you are requiring postoperative pain medications.  Take with a full glass of water daily.  If you experience loose stools or diarrhea, hold the colace until you stool forms back up.  If your symptoms do not get better within 1 week or if they get worse, check with your doctor.  Dulcolax (bisacodyl) - Pick up over-the-counter and take as directed by the product packaging as needed to assist with the movement of your bowels.  Take with a full glass of water.  Use this product as needed if not relieved by Colace only.   MiraLax (polyethylene glycol) - Pick up over-the-counter to have on hand.  MiraLax is a solution that will increase the amount of water in your bowels to assist with bowel  movements.  Take as directed and can mix with a glass of water, juice, soda, coffee, or tea.  Take if you go more than two days without a movement. Do not use MiraLax more than once per day. Call your doctor if you are still constipated or irregular after using this medication for 7 days in a row.  If you continue to have problems with postoperative constipation, please contact the office for further assistance and recommendations.  If you experience "the worst abdominal pain ever" or develop nausea or vomiting, please contact the office immediatly for further recommendations for treatment.    Do not put a pillow under the knee. Place it under the heel.    Complete by:  As directed    Do not sit on  low chairs, stoools or toilet seats, as it may be difficult to get up from low surfaces    Complete by:  As directed    Driving restrictions    Complete by:  As directed    No driving until released by the physician.   Increase activity slowly as tolerated    Complete by:  As directed    Lifting restrictions    Complete by:  As directed    No lifting until released by the physician.   Patient may shower    Complete by:  As directed    You may shower without a dressing once there is no drainage.  Do not wash over the wound.  If drainage remains, do not shower until drainage stops.   TED hose    Complete by:  As directed    Use stockings (TED hose) for 3 weeks on both leg(s).  You may remove them at night for sleeping.   Weight bearing as tolerated    Complete by:  As directed    Laterality:  left   Extremity:  Lower     Allergies as of 12/09/2016   No Known Allergies     Medication List    STOP taking these medications   doxycycline 100 MG tablet Commonly known as:  VIBRA-TABS   Fish Oil 1200 MG Caps   GLUCOSAMINE CHOND COMPLEX/MSM PO   multivitamin-iron-minerals-folic acid chewable tablet   naproxen sodium 220 MG tablet Commonly known as:  ALEVE   SAW PALMETTO PO   vitamin  B-12 1000 MCG tablet Commonly known as:  CYANOCOBALAMIN     TAKE these medications   apixaban 5 MG Tabs tablet Commonly known as:  ELIQUIS Take 1 tablet (5 mg total) by mouth 2 (two) times daily.   atorvastatin 80 MG tablet Commonly known as:  LIPITOR Take 40-80 mg by mouth See admin instructions. Takes 80 mg by mouth daily on Monday, Wednesday, Fridays and take 40 mg by mouth on all other days of the week   BIOTIN PO Take 5,000 mcg by mouth daily.   docusate sodium 100 MG capsule Commonly known as:  COLACE Take 200 mg by mouth at bedtime.   finasteride 5 MG tablet Commonly known as:  PROSCAR Take 5 mg by mouth at bedtime.   levothyroxine 25 MCG tablet Commonly known as:  SYNTHROID, LEVOTHROID Take 25 mcg by mouth daily before breakfast.   methocarbamol 500 MG tablet Commonly known as:  ROBAXIN Take 1 tablet (500 mg total) by mouth every 6 (six) hours as needed for muscle spasms.   metoprolol succinate 25 MG 24 hr tablet Commonly known as:  TOPROL XL Take 1 tablet (25 mg total) by mouth 2 (two) times daily.   omeprazole 20 MG capsule Commonly known as:  PRILOSEC Take 20 mg by mouth daily.   oxyCODONE 5 MG immediate release tablet Commonly known as:  Oxy IR/ROXICODONE Take 1 tablet (5 mg total) by mouth every 4 (four) hours as needed for moderate pain or severe pain. What changed:  when to take this  reasons to take this   pioglitazone-metformin 15-500 MG tablet Commonly known as:  ACTOPLUS MET Take 1 tablet by mouth daily.   traMADol 50 MG tablet Commonly known as:  ULTRAM Take 1-2 tablets (50-100 mg total) by mouth every 6 (six) hours as needed (mild pain).   Vitamin D 2000 units tablet Take 2,000 Units by mouth daily.  Discharge Care Instructions        Start     Ordered   12/09/16 0000  Weight bearing as tolerated    Question Answer Comment  Laterality left   Extremity Lower      12/09/16 0743   12/09/16 0000  Change dressing      Comments:  Change dressing daily with sterile 4 x 4 inch gauze dressing and apply TED hose. Do not submerge the incision under water.   12/09/16 0743     Follow-up Information    Gaynelle Arabian, MD. Schedule an appointment as soon as possible for a visit on 12/21/2016.   Specialty:  Orthopedic Surgery Contact information: [address] [place] [address] [phone]           Signed: Arlee Muslim, PA-C Orthopaedic Surgery 12/09/2016, 8:30 AM

## 2016-12-09 NOTE — Care Management Obs Status (Signed)
MEDICARE OBSERVATION STATUS NOTIFICATION   Patient Details  Name: [name] MelenaGary P Dyer MRN: [id number] Date of Birth: 04-01-1940   Medicare Observation Status Notification Given:  Yes    Alexis Goodelleele, Hussein Macdougal K, RN 12/09/2016, 3:02 PM

## 2016-12-12 LAB — TYPE AND SCREEN
ABO/RH(D): A POS
Antibody Screen: POSITIVE
DAT, IgG: NEGATIVE
Unit division: 0

## 2016-12-12 LAB — BPAM RBC
Blood Product Expiration Date: 201811162359
Unit Type and Rh: 6200

## 2016-12-24 ENCOUNTER — Telehealth: Payer: Self-pay

## 2016-12-24 NOTE — Telephone Encounter (Addendum)
    Chart reviewed as part of pre-operative protocol coverage. Patient was contacted 12/24/2016 in reference to pre-operative risk assessment for pending surgery as outlined below.  [name] was last seen on 10/06/2016 by Dr. Duke Salviaandolph.  Since that day, [name] has done well from a heart standpoint.  He had recent knee surgery and had no problems with that.  Therefore, based on ACC/AHA guidelines, the patient would be at acceptable risk for the planned procedure without further cardiovascular testing.   The surgery is scheduled for November 26.  Advised him I would route this to the pharmacist so they can manage holding the Eliquis.  I will route this recommendation to the requesting party via Epic fax function and remove from pre-op pool.  Please call with questions.  Theodore Demarkhonda Nayely Dingus, PA-C 12/24/2016, 5:04 PM

## 2016-12-24 NOTE — Telephone Encounter (Signed)
      [facility] Pre-operative Risk Assessment    Request for surgical clearance:  1. What type of surgery is being performed? NONE LISTED   OUTPATIENT EYE SURGERY   2. When is this surgery scheduled? TBD   3. Are there any medications that need to be held prior to surgery and how long?  NONE LISTED PT TAKES ELIQUIS  4. Practice name and name of physician performing surgery? [facility]   5. What is your office phone and fax number? [phone] FX [phone]   6. Anesthesia type (None, local, MAC, general) ? NOT LISTED-IV SEDATION   Waylan Rocher 12/24/2016, 3:19 PM  _________________________________________________________________   (provider comments below)

## 2016-12-27 NOTE — Telephone Encounter (Signed)
Patient with diagnosis of atrial fibrillation on Eliquis for anticoagulation.    Procedure: outpatient eye surgery Date of procedure: Nov 26   CHADS2-VASc score of  2 (AGE x 2, DM2)  CrCl 87 Platelet count 160  Per office protocol, patient can hold Eliquis for 3 days prior to procedure.    Patient should restart Eliquis on the evening of procedure or day after, at discretion of procedure MD

## 2016-12-27 NOTE — Telephone Encounter (Signed)
Clear[facility] via Epic [facility][name] Kitchen

## 2017-01-27 ENCOUNTER — Ambulatory Visit (INDEPENDENT_AMBULATORY_CARE_PROVIDER_SITE_OTHER): Payer: Medicare Other | Admitting: Cardiovascular Disease

## 2017-01-27 ENCOUNTER — Encounter: Payer: Self-pay | Admitting: Cardiovascular Disease

## 2017-01-27 VITALS — BP 112/64 | HR 68 | Ht 67.0 in | Wt 216.0 lb

## 2017-01-27 DIAGNOSIS — I480 Paroxysmal atrial fibrillation: Secondary | ICD-10-CM | POA: Diagnosis not present

## 2017-01-27 NOTE — Progress Notes (Signed)
Cardiology Office Note   Date:  01/27/2017   ID:  [name], [name] 06/18/1940, MRN [id number]  PCP:  [name] Gravel, MD  Cardiologist:   [name] Latch, MD   Chief Complaint  Patient presents with  . Follow-up     History of Present Illness: [name] is a 76 y.o. male with paroxysmal atrial fibrillation, diabetes, hyperlipidemia, and OSA on CPAP, who presents for follow-up. [name] was initially seen 06/21/16 when he developed postoperative atrial fibrillation. He underwent closed manipulation of the left knee and was noted to be in atrial fibrillation in the PACU.  This was his first known episode of atrial fibrillation.  Prior to the episode he noted 2 weeks of exertional dyspnea. He was relatively asymptomatic and was started on metoprolol and Eliquis.  He followed up in atrial fibrillation clinic and was feeling well.  TSH was mildly elevated but T4 was within the normal range.  He was subsequetly started on low dose levothyroxine.  He had an echo 07/02/16 that revealed LVEF 60-65% with grade 1 diastolic dysfunction.  [name] has been doing well.  He underwent revision of the L knee replacement on 12/08/16.  This occurred without complication.  He has not noted any palpitations, chest pain, or shortness of breath.  He participates in physical therapy 3 times per week without symptoms.  He does not get much exercise outside of therapy.  He hasn't noted any lower extremity edema, orthopnea or PND.     Past Medical History:  Diagnosis Date  . 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 Barrett's esophagus with dysplasia   . Chronic lower back pain    DDD  . Closed patellar sleeve fracture of left knee   . Constipation   . Crush injury knee    bilateral  . Dysphagia    EGD with dilitation  . Enlarged prostate    sees a urologist  . GERD (gastroesophageal reflux disease)    takes Omeprazole daily  . Hard of hearing   .  Hyperlipidemia    takes Lipitor and Fish Oil daily  . Insomnia    d/t pain  . Joint pain   . Joint swelling   . Left tibial fracture   . Multiple duodenal ulcers   . Nocturia   . OSA on CPAP    cpap  . Pneumonia    "about 5 years ago"  . Type II diabetes mellitus ([place])    type 2  . Unsteady gait   . Urinary frequency   . Wears glasses     Past Surgical History:  Procedure Laterality Date  . ANTERIOR CERVICAL DECOMP/DISCECTOMY FUSION  10/2010  . ANTERIOR LAT LUMBAR FUSION  12/02/2011   Procedure: ANTERIOR LATERAL LUMBAR FUSION 1 LEVEL;  Surgeon: [name] Moore, MD;  Location: [facility];  Service: Neurosurgery;  Laterality: Left;  left lumbar three-four  . BACK SURGERY    . COLONOSCOPY  04/2007  . ESOPHAGOGASTRODUODENOSCOPY    . ESOPHAGOGASTRODUODENOSCOPY  04/2007; 07/2008  . ESOPHAGOGASTRODUODENOSCOPY (EGD) WITH ESOPHAGEAL DILATION  "2-3 times"  . EXCISIONAL HEMORRHOIDECTOMY  ~ 1966  . FEMORAL REVISION Left 12/08/2016   Procedure: LEFT KNEE FEMORAL REVISION;  Surgeon: [name] Arabian, MD;  Location: [facility];  Service: Orthopedics;  Laterality: Left;  Adductor Block  . FINGER SURGERY Right    pinky finger  . JOINT REPLACEMENT    . KNEE CLOSED REDUCTION Left 06/21/2016  Procedure: CLOSED MANIPULATION LEFT KNEE;  Surgeon: [name] Arabian, MD;  Location: [facility];  Service: Orthopedics;  Laterality: Left;  . KNEE JOINT MANIPULATION  06/2009  . LAPAROSCOPIC CHOLECYSTECTOMY  02/2000  . LUMBAR FUSION  03/16/2012   Dr [name]  . ORIF TIBIA PLATEAU Left 09/24/2015   Procedure: OPEN REDUCTION INTERNAL FIXATION (ORIF) LEFT TIBIAL PLATEAU;  Surgeon: [name] Arabian, MD;  Location: [facility];  Service: Orthopedics;  Laterality: Left;  . PARTIAL KNEE ARTHROPLASTY Left 07/30/2015   Procedure: UNICOMPARTMENTAL LEFT KNEE MEDIAL;  Surgeon: [name] Arabian, MD;  Location: [facility];  Service: Orthopedics;  Laterality: Left;  . POSTERIOR LUMBAR FUSION  04/2015   rods inserted L2  . REFRACTIVE SURGERY Right 1980s    . SHOULDER OPEN ROTATOR CUFF REPAIR Bilateral 1990s  . TENNIS ELBOW RELEASE/NIRSCHEL PROCEDURE Right 1980s  . TOTAL KNEE ARTHROPLASTY Right 03/2009  . TOTAL KNEE ARTHROPLASTY WITH REVISION COMPONENTS Left 03/03/2016   Procedure: REVISION LEFT KNEE UNICOMPARTMENTAL ARTHROPLASTY TO TOTAL KNEE ARTHROPLASTY;  Surgeon: [name] Arabian, MD;  Location: [facility];  Service: Orthopedics;  Laterality: Left;  requests 2hrs; Adductor Block     Current Outpatient Medications  Medication Sig Dispense Refill  . apixaban (ELIQUIS) 5 MG TABS tablet Take 1 tablet (5 mg total) by mouth 2 (two) times daily. (Patient taking differently: Take 5 mg by mouth 2 (two) times daily. ) 60 tablet 1  . atorvastatin (LIPITOR) 80 MG tablet Take 40-80 mg by mouth See admin instructions. Takes 80 mg by mouth daily on Monday, Wednesday, Fridays and take 40 mg by mouth on all other days of the week    . BIOTIN Dyer Take 5,000 mcg by mouth daily.     . Cholecalciferol (VITAMIN D) 2000 units tablet Take 2,000 Units by mouth daily.     [name] Kitchen docusate sodium (COLACE) 100 MG capsule Take 200 mg by mouth at bedtime.     [name] Kitchen doxycycline (VIBRA-TABS) 100 MG tablet Take 100 mg by mouth 2 (two) times daily.    . finasteride (PROSCAR) 5 MG tablet Take 5 mg by mouth at bedtime.     . Glucosamine-Chondroitin (GLUCOSAMINE CHONDR COMPLEX Dyer) Take 1 tablet by mouth daily.    [name] Kitchen levothyroxine (SYNTHROID, LEVOTHROID) 25 MCG tablet Take 25 mcg by mouth daily before breakfast.    . Methylcellulose, Laxative, (CITRUCEL Dyer) Take 2 tablets by mouth daily.    . metoprolol succinate (TOPROL XL) 25 MG 24 hr tablet Take 1 tablet (25 mg total) by mouth 2 (two) times daily. 60 tablet 1  . Multiple Vitamins-Minerals (CENTRUM SILVER Dyer) Take by mouth.    . Omega-3 Fatty Acids (FISH OIL) 1200 MG CAPS Take 1,200 mg by mouth daily.    [name] Kitchen omeprazole (PRILOSEC) 20 MG capsule Take 20 mg by mouth daily.    [name] Kitchen oxyCODONE (OXY IR/ROXICODONE) 5 MG immediate release tablet Take 1 tablet  (5 mg total) by mouth every 4 (four) hours as needed for moderate pain or severe pain. 60 tablet 0  . pioglitazone-metformin (ACTOPLUS MET) 15-500 MG per tablet Take 1 tablet by mouth daily.     . Saw Palmetto 450 MG CAPS Take 450 mg by mouth daily.    . tamsulosin (FLOMAX) 0.4 MG CAPS capsule Take 0.4 mg by mouth daily.     No current facility-administered medications for this visit.     Allergies:   Patient has no known allergies.    Social History:  The patient  reports that he quit smoking about 31  years ago. His smoking use included cigarettes. He has a 22.50 pack-year smoking history. he has never used smokeless tobacco. He reports that he drinks alcohol. He reports that he does not use drugs.   Family History:  The patient's family history includes Diabetes in his brother.    ROS:  Please see the history of present illness.   Otherwise, review of systems are positive for none.   All other systems are reviewed and negative.    PHYSICAL EXAM: VS:  BP 112/64   Pulse 68   Ht 5' 7"  (1.702 m)   Wt 216 lb (98 kg)   BMI 33.83 kg/m  , BMI Body mass index is 33.83 kg/m. GENERAL:  Well appearing HEENT: Pupils equal round and reactive, fundi not visualized, oral mucosa unremarkable NECK:  No jugular venous distention, waveform within normal limits, carotid upstroke brisk and symmetric, no bruits, no thyromegaly LUNGS:  Clear to auscultation bilaterally HEART:  RRR.  PMI not displaced or sustained,S1 and S2 within normal limits, no S3, no S4, no clicks, no rubs, no murmurs ABD:  Flat, positive bowel sounds normal in frequency in pitch, no bruits, no rebound, no guarding, no midline pulsatile mass, no hepatomegaly, no splenomegaly EXT:  2 plus pulses throughout, no edema, no cyanosis no clubbing.  Unable to fully extend L knee.  SKIN:  No rashes no nodules NEURO:  Cranial nerves II through XII grossly intact, motor grossly intact throughout PSYCH:  Cognitively intact, oriented to person  place and time   EKG:  EKG is not ordered today.   Echo 07/02/16: Study Conclusions  - Left ventricle: The cavity size was normal. Wall thickness was   increased in a pattern of mild LVH. Systolic function was normal.   The estimated ejection fraction was in the range of 60% to 65%.   Wall motion was normal; there were no regional wall motion   abnormalities. Doppler parameters are consistent with abnormal   left ventricular relaxation (grade 1 diastolic dysfunction). - Aortic valve: There was no stenosis. - Mitral valve: Mildly calcified annulus. There was trivial   regurgitation. - Right ventricle: The cavity size was normal. Systolic function   was normal. - Tricuspid valve: Peak RV-RA gradient (S): 26 mm Hg. - Pulmonary arteries: PA peak pressure: 29 mm Hg (S). - Inferior vena cava: The vessel was normal in size. The   respirophasic diameter changes were in the normal range (>= 50%),   consistent with normal central venous pressure.  Impressions:  - Normal LV size with mild LV hypertrophy, EF 60-65%. Normal RV   size and systolic function. No signficant valvular abnormalities.   Recent Labs: 06/21/2016: Magnesium 2.1 10/06/2016: TSH 4.710 11/30/2016: ALT 21 12/09/2016: BUN 20; Creatinine, Ser 0.98; Hemoglobin 12.4; Platelets 160; Potassium 4.2; Sodium 138    Lipid Panel No results found for: CHOL, TRIG, HDL, CHOLHDL, VLDL, LDLCALC, LDLDIRECT    Wt Readings from Last 3 Encounters:  01/27/17 216 lb (98 kg)  12/08/16 214 lb (97.1 kg)  11/30/16 214 lb (97.1 kg)      ASSESSMENT AND PLAN:  # Paroxysmal atrial fibrillation:  [name] had an episode of peri-operative atrial fibrillation.  He has never experienced any recurrent symptoms and in sinus rhythm today.  and at his ointment and atrial fibrillation clinic.  I suspect that this was related to the surgery.  He was also mildly hypothyroid and this is now being treated.  He would like to avoid anticoagulation if  possible.  We will get a 30-day event monitor to assess for recurrent atrial fibrillation.  If he has none, we will switch Eliquis to aspirin.  Continue metoprolol.  # Hyperlipidemia: Continue atorvastatin.  We will get a copy of his lipids from his PCP.   Current medicines are reviewed at length with the patient today.  The patient does not have concerns regarding medicines.  The following changes have been made:  no change  Labs/ tests ordered today include:   Orders Placed This Encounter  Procedures  . CARDIAC EVENT MONITOR     Disposition:   FU with Samanyu Tinnell C. Oval Linsey, MD, [facility] in 1 year.    This note was written with the assistance of speech recognition software.  Please excuse any transcriptional errors.  Signed, [place] C. Oval Linsey, MD, [facility]  01/27/2017 9:27 AM    [place]

## 2017-01-27 NOTE — Patient Instructions (Addendum)
Medication Instructions:  Your physician recommends that you continue on your current medications as directed. Please refer to the Current Medication list given to you today.  Labwork: NONE  Testing/Procedures: Your physician has recommended that you wear an event monitor. Event monitors are medical devices that record the heart's electrical activity. Doctors most often us these monitors to diagnose arrhythmias. Arrhythmias are problems with the speed or rhythm of the heartbeat. The monitor is a small, portable device. You can wear one while you do your normal daily activities. This is usually used to diagnose what is causing palpitations/syncope (passing out). 30 DAY  [facility] AT [address]  Follow-Up: Your physician wants you to follow-up in: 1 YEAR OV  You will receive a reminder letter in the mail two months in advance. If you don't receive a letter, please call our office to schedule the follow-up appointment.  If you need a refill on your cardiac medications before your next appointment, please call your pharmacy.

## 2017-02-10 ENCOUNTER — Ambulatory Visit (INDEPENDENT_AMBULATORY_CARE_PROVIDER_SITE_OTHER): Payer: Medicare Other

## 2017-02-10 DIAGNOSIS — I480 Paroxysmal atrial fibrillation: Secondary | ICD-10-CM | POA: Diagnosis not present

## 2017-02-23 ENCOUNTER — Other Ambulatory Visit: Payer: Self-pay

## 2017-02-23 ENCOUNTER — Encounter (HOSPITAL_COMMUNITY): Payer: Self-pay | Admitting: *Deleted

## 2017-02-23 NOTE — Progress Notes (Signed)
Need orders for surgery on 02/28/17.

## 2017-02-24 ENCOUNTER — Ambulatory Visit: Payer: Self-pay | Admitting: Orthopedic Surgery

## 2017-02-28 ENCOUNTER — Ambulatory Visit (HOSPITAL_COMMUNITY): Payer: Medicare Other | Admitting: Anesthesiology

## 2017-02-28 ENCOUNTER — Encounter (HOSPITAL_COMMUNITY): Payer: Self-pay | Admitting: *Deleted

## 2017-02-28 ENCOUNTER — Ambulatory Visit (HOSPITAL_COMMUNITY)
Admission: RE | Admit: 2017-02-28 | Discharge: 2017-02-28 | Disposition: A | Discharge status: Home / Self Care | Payer: Medicare Other | Source: Ambulatory Visit | Attending: Orthopedic Surgery | Admitting: Orthopedic Surgery

## 2017-02-28 ENCOUNTER — Encounter (HOSPITAL_COMMUNITY)
Admission: RE | Disposition: A | Discharge status: Home / Self Care | Payer: Self-pay | Source: Ambulatory Visit | Attending: Orthopedic Surgery

## 2017-02-28 DIAGNOSIS — Z79899 Other long term (current) drug therapy: Secondary | ICD-10-CM | POA: Diagnosis not present

## 2017-02-28 DIAGNOSIS — Z8719 Personal history of other diseases of the digestive system: Secondary | ICD-10-CM | POA: Diagnosis not present

## 2017-02-28 DIAGNOSIS — N401 Enlarged prostate with lower urinary tract symptoms: Secondary | ICD-10-CM | POA: Insufficient documentation

## 2017-02-28 DIAGNOSIS — Z7984 Long term (current) use of oral hypoglycemic drugs: Secondary | ICD-10-CM | POA: Diagnosis not present

## 2017-02-28 DIAGNOSIS — G4700 Insomnia, unspecified: Secondary | ICD-10-CM | POA: Insufficient documentation

## 2017-02-28 DIAGNOSIS — R351 Nocturia: Secondary | ICD-10-CM | POA: Insufficient documentation

## 2017-02-28 DIAGNOSIS — K219 Gastro-esophageal reflux disease without esophagitis: Secondary | ICD-10-CM | POA: Insufficient documentation

## 2017-02-28 DIAGNOSIS — M24662 Ankylosis, left knee: Secondary | ICD-10-CM | POA: Diagnosis present

## 2017-02-28 DIAGNOSIS — G4733 Obstructive sleep apnea (adult) (pediatric): Secondary | ICD-10-CM | POA: Diagnosis not present

## 2017-02-28 DIAGNOSIS — E119 Type 2 diabetes mellitus without complications: Secondary | ICD-10-CM | POA: Diagnosis not present

## 2017-02-28 DIAGNOSIS — E785 Hyperlipidemia, unspecified: Secondary | ICD-10-CM | POA: Insufficient documentation

## 2017-02-28 HISTORY — PX: KNEE CLOSED REDUCTION: SHX995

## 2017-02-28 LAB — GLUCOSE, CAPILLARY
Glucose-Capillary: 134 mg/dL — ABNORMAL HIGH (ref 65–99)
Glucose-Capillary: 130 mg/dL — ABNORMAL HIGH (ref 65–99)

## 2017-02-28 SURGERY — MANIPULATION, KNEE, CLOSED
Anesthesia: General | Site: Knee | Laterality: Left

## 2017-02-28 MED ORDER — LACTATED RINGERS IV SOLN
INTRAVENOUS | Status: DC
  Administered 2017-02-28: 16:00:00 via INTRAVENOUS

## 2017-02-28 MED ORDER — DEXAMETHASONE SODIUM PHOSPHATE 10 MG/ML IJ SOLN
INTRAMUSCULAR | Status: DC | PRN
  Administered 2017-02-28: 10 mg via INTRAVENOUS

## 2017-02-28 MED ORDER — FENTANYL CITRATE (PF) 100 MCG/2ML IJ SOLN
25.0000 ug | INTRAMUSCULAR | Status: DC | PRN
  Administered 2017-02-28 (×2): 50 ug via INTRAVENOUS

## 2017-02-28 MED ORDER — ONDANSETRON HCL 4 MG/2ML IJ SOLN
INTRAMUSCULAR | Status: DC | PRN
  Administered 2017-02-28: 4 mg via INTRAVENOUS

## 2017-02-28 MED ORDER — ACETAMINOPHEN 10 MG/ML IV SOLN
1000.0000 mg | Freq: Once | INTRAVENOUS | Status: AC
Start: 2017-02-28 — End: 2017-02-28
  Administered 2017-02-28: 1000 mg via INTRAVENOUS
  Filled 2017-02-28: qty 100

## 2017-02-28 MED ORDER — POVIDONE-IODINE 10 % EX SWAB: 2.0000 "application " | Freq: Once | CUTANEOUS | Status: DC

## 2017-02-28 MED ORDER — PROPOFOL 10 MG/ML IV BOLUS
INTRAVENOUS | Status: AC
  Filled 2017-02-28: qty 20

## 2017-02-28 MED ORDER — FENTANYL CITRATE (PF) 100 MCG/2ML IJ SOLN
INTRAMUSCULAR | Status: AC
  Filled 2017-02-28: qty 2

## 2017-02-28 MED ORDER — LIDOCAINE 2% (20 MG/ML) 5 ML SYRINGE
INTRAMUSCULAR | Status: AC
  Filled 2017-02-28: qty 5

## 2017-02-28 MED ORDER — PROPOFOL 10 MG/ML IV BOLUS
INTRAVENOUS | Status: DC | PRN
  Administered 2017-02-28: 200 mg via INTRAVENOUS

## 2017-02-28 MED ORDER — FENTANYL CITRATE (PF) 100 MCG/2ML IJ SOLN
INTRAMUSCULAR | Status: AC
  Administered 2017-02-28: 50 ug via INTRAVENOUS
  Filled 2017-02-28: qty 2

## 2017-02-28 MED ORDER — LIDOCAINE 2% (20 MG/ML) 5 ML SYRINGE
INTRAMUSCULAR | Status: DC | PRN
  Administered 2017-02-28: 100 mg via INTRAVENOUS

## 2017-02-28 MED ORDER — HYDROMORPHONE HCL 1 MG/ML IJ SOLN
0.5000 mg | INTRAMUSCULAR | Status: DC | PRN
  Administered 2017-02-28 (×2): 0.5 mg via INTRAVENOUS

## 2017-02-28 MED ORDER — DEXAMETHASONE SODIUM PHOSPHATE 10 MG/ML IJ SOLN: 10.0000 mg | Freq: Once | INTRAMUSCULAR | Status: DC

## 2017-02-28 MED ORDER — SODIUM CHLORIDE 0.9 % IV SOLN: INTRAVENOUS | Status: DC

## 2017-02-28 MED ORDER — MEPERIDINE HCL 50 MG/ML IJ SOLN: 6.2500 mg | INTRAMUSCULAR | Status: DC | PRN

## 2017-02-28 MED ORDER — HYDROMORPHONE HCL 1 MG/ML IJ SOLN
INTRAMUSCULAR | Status: AC
  Filled 2017-02-28: qty 1

## 2017-02-28 MED ORDER — DEXAMETHASONE SODIUM PHOSPHATE 10 MG/ML IJ SOLN
INTRAMUSCULAR | Status: AC
  Filled 2017-02-28: qty 1

## 2017-02-28 MED ORDER — ONDANSETRON HCL 4 MG/2ML IJ SOLN
INTRAMUSCULAR | Status: AC
  Filled 2017-02-28: qty 2

## 2017-02-28 MED ORDER — CHLORHEXIDINE GLUCONATE 4 % EX LIQD: 60.0000 mL | Freq: Once | CUTANEOUS | Status: DC

## 2017-02-28 SURGICAL SUPPLY — 11 items
BANDAGE ADH SHEER 1  50/CT (GAUZE/BANDAGES/DRESSINGS) IMPLANT
COVER SURGICAL LIGHT HANDLE (MISCELLANEOUS) IMPLANT
GAUZE SPONGE 4X4 12PLY STRL (GAUZE/BANDAGES/DRESSINGS) IMPLANT
GLOVE BIO SURGEON STRL SZ8 (GLOVE) IMPLANT
GLOVE BIOGEL PI IND STRL 8 (GLOVE) IMPLANT
GLOVE BIOGEL PI INDICATOR 8 (GLOVE)
NDL SAFETY ECLIPSE 18X1.5 (NEEDLE) IMPLANT
NEEDLE HYPO 18GX1.5 SHARP (NEEDLE)
SWABSTICK PVP IODINE (MISCELLANEOUS) IMPLANT
SYR CONTROL 10ML LL (SYRINGE) IMPLANT
WRAP KNEE MAXI GEL POST OP (GAUZE/BANDAGES/DRESSINGS) ×3 IMPLANT

## 2017-02-28 NOTE — Anesthesia Preprocedure Evaluation (Signed)
Anesthesia Evaluation  Patient identified by MRN, date of birth, ID band Patient awake    Reviewed: Allergy & Precautions, NPO status , Patient's Chart, lab work & pertinent test results  Airway Mallampati: II  TM Distance: >3 FB Neck ROM: Full    Dental   Pulmonary sleep apnea , former smoker,    breath sounds clear to auscultation       Cardiovascular negative cardio ROS   Rhythm:Regular Rate:Normal     Neuro/Psych    GI/Hepatic Neg liver ROS, GERD  ,  Endo/Other  diabetes  Renal/GU negative Renal ROS     Musculoskeletal  (+) Arthritis , Osteoarthritis,    Abdominal   Peds  Hematology   Anesthesia Other Findings   Reproductive/Obstetrics                             Anesthesia Physical  Anesthesia Plan  ASA: III  Anesthesia Plan: General   Post-op Pain Management: GA combined w/ Regional for post-op pain   Induction: Intravenous  PONV Risk Score and Plan: 1 and Treatment may vary due to age or medical condition  Airway Management Planned: Mask  Additional Equipment:   Intra-op Plan:   Post-operative Plan:   Informed Consent: I have reviewed the patients History and Physical, chart, labs and discussed the procedure including the risks, benefits and alternatives for the proposed anesthesia with the patient or authorized representative who has indicated his/her understanding and acceptance.   Dental advisory given  Plan Discussed with: CRNA, Anesthesiologist and Surgeon  Anesthesia Plan Comments:         Anesthesia Quick Evaluation

## 2017-02-28 NOTE — Discharge Instructions (Signed)
Resume activities as tolerated  Resume physical therapy tomorrow  I was able to flex your knee 125 degrees   General Anesthesia, Adult, Care After These instructions provide you with information about caring for yourself after your procedure. Your health care provider may also give you more specific instructions. Your treatment has been planned according to current medical practices, but problems sometimes occur. Call your health care provider if you have any problems or questions after your procedure. What can I expect after the procedure? After the procedure, it is common to have:  Vomiting.  A sore throat.  Mental slowness.  It is common to feel:  Nauseous.  Cold or shivery.  Sleepy.  Tired.  Sore or achy, even in parts of your body where you did not have surgery.  Follow these instructions at home: For at least 24 hours after the procedure:  Do not: ? Participate in activities where you could fall or become injured. ? Drive. ? Use heavy machinery. ? Drink alcohol. ? Take sleeping pills or medicines that cause drowsiness. ? Make important decisions or sign legal documents. ? Take care of children on your own.  Rest. Eating and drinking  If you vomit, drink water, juice, or soup when you can drink without vomiting.  Drink enough fluid to keep your urine clear or pale yellow.  Make sure you have little or no nausea before eating solid foods.  Follow the diet recommended by your health care provider. General instructions  Have a responsible adult stay with you until you are awake and alert.  Return to your normal activities as told by your health care provider. Ask your health care provider what activities are safe for you.  Take over-the-counter and prescription medicines only as told by your health care provider.  If you smoke, do not smoke without supervision.  Keep all follow-up visits as told by your health care provider. This is important. Contact  a health care provider if:  You continue to have nausea or vomiting at home, and medicines are not helpful.  You cannot drink fluids or start eating again.  You cannot urinate after 8-12 hours.  You develop a skin rash.  You have fever.  You have increasing redness at the site of your procedure. Get help right away if:  You have difficulty breathing.  You have chest pain.  You have unexpected bleeding.  You feel that you are having a life-threatening or urgent problem. This information is not intended to replace advice given to you by your health care provider. Make sure you discuss any questions you have with your health care provider. Document Released: 05/03/2000 Document Revised: 06/30/2015 Document Reviewed: 01/09/2015 Elsevier Interactive Patient Education  [employer]2018 [employer]

## 2017-02-28 NOTE — Progress Notes (Signed)
Per Dr. Miguel Rotadonno, do not need to get labs on this patient.

## 2017-02-28 NOTE — Op Note (Signed)
  OPERATIVE REPORT   PREOPERATIVE DIAGNOSIS: Arthrofibrosis, Left  knee.   POSTOPERATIVE DIAGNOSIS: Arthrofibrosis, Left knee.   PROCEDURE:  Left  knee closed manipulation.   SURGEON: Ollen GrossFrank Everette Mall, MD   ASSISTANT: None.   ANESTHESIA: General.   COMPLICATIONS: None.   CONDITION: Stable to Recovery.   Pre-manipulation range of motion is 12-85.  Post-manipulation range of  Motion is 5-125  PROCEDURE IN DETAIL: After successful administration of general  anesthetic, exam under anesthesia was performed showing range of motion  12-85 degrees. I then placed my chest against the proximal tibia,  flexing the knee with audible lysis of adhesions. I was easily able to  get the knee flexed to 125  degrees. I then put the knee back in extension and with some  patellar manipulation and gentle pressure got within 5 degrees of full  Extension.The patient was subsequently awakened and transported to Recovery in  stable condition.

## 2017-02-28 NOTE — Transfer of Care (Signed)
Immediate Anesthesia Transfer of Care Note  Patient: [name]  Procedure(s) Performed: CLOSED MANIPULATION KNEE (Left Knee)  Patient Location: PACU  Anesthesia Type:General  Level of Consciousness: sedated  Airway & Oxygen Therapy: Patient Spontanous Breathing and Patient connected to face mask oxygen  Post-op Assessment: Report given to RN and Post -op Vital signs reviewed and stable  Post vital signs: Reviewed and stable  Last Vitals:  Vitals:   02/28/17 1512  BP: 137/71  Pulse: 64  Resp: 18  Temp: (!) 36.3 C  SpO2: 98%    Last Pain:  Vitals:   02/28/17 1512  TempSrc: Oral         Complications: No apparent anesthesia complications

## 2017-02-28 NOTE — H&P (Signed)
CC- [name] is a 77 y.o. male who presents with left knee stiffness.  HPI- . Knee Pain: Patient presents with stiffness involving the  left knee. Onset of the symptoms was several months ago. Inciting event: He had revision left total knee arthroplasty several months ago and has had considerable stiffness in the knee. He has a long complex history with that knee. He has had physical therapy andf his range of motion has plateaued at approximately 90 degrees of flexion. He also has a 12 degree flexion conracture despite PT and static bracing. He present now for closed manipulation. I have told him that the procedure should restore more flexion but the extensionwould be less predictable. .    Past Medical History:  Diagnosis Date  . 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 Barrett's esophagus with dysplasia   . Chronic lower back pain    DDD  . Closed patellar sleeve fracture of left knee   . Constipation   . Crush injury knee    bilateral  . Dysphagia    EGD with dilitation  . Enlarged prostate    sees a urologist  . GERD (gastroesophageal reflux disease)    takes Omeprazole daily  . Hard of hearing   . Hyperlipidemia    takes Lipitor and Fish Oil daily  . Insomnia    d/t pain  . Joint pain   . Joint swelling   . Left tibial fracture   . Multiple duodenal ulcers   . Nocturia   . OSA on CPAP    cpap  . Pneumonia    "about 5 years ago"  . Type II diabetes mellitus ([place])    type 2  . Unsteady gait   . Urinary frequency   . Wears glasses     Past Surgical History:  Procedure Laterality Date  . ANTERIOR CERVICAL DECOMP/DISCECTOMY FUSION  10/2010  . ANTERIOR LAT LUMBAR FUSION  12/02/2011   Procedure: ANTERIOR LATERAL LUMBAR FUSION 1 LEVEL;  Surgeon: Eustace Moore, MD;  Location: [facility];  Service: Neurosurgery;  Laterality: Left;  left lumbar three-four  . BACK SURGERY    . COLONOSCOPY  04/2007  . ESOPHAGOGASTRODUODENOSCOPY    .  ESOPHAGOGASTRODUODENOSCOPY  04/2007; 07/2008  . ESOPHAGOGASTRODUODENOSCOPY (EGD) WITH ESOPHAGEAL DILATION  "2-3 times"  . EXCISIONAL HEMORRHOIDECTOMY  ~ 1966  . FEMORAL REVISION Left 12/08/2016   Procedure: LEFT KNEE FEMORAL REVISION;  Surgeon: Gaynelle Arabian, MD;  Location: [facility];  Service: Orthopedics;  Laterality: Left;  Adductor Block  . FINGER SURGERY Right    pinky finger  . JOINT REPLACEMENT    . KNEE CLOSED REDUCTION Left 06/21/2016   Procedure: CLOSED MANIPULATION LEFT KNEE;  Surgeon: Gaynelle Arabian, MD;  Location: [facility];  Service: Orthopedics;  Laterality: Left;  . KNEE JOINT MANIPULATION  06/2009  . LAPAROSCOPIC CHOLECYSTECTOMY  02/2000  . LUMBAR FUSION  03/16/2012   Dr Ronnald Ramp  . ORIF TIBIA PLATEAU Left 09/24/2015   Procedure: OPEN REDUCTION INTERNAL FIXATION (ORIF) LEFT TIBIAL PLATEAU;  Surgeon: Gaynelle Arabian, MD;  Location: [facility];  Service: Orthopedics;  Laterality: Left;  . PARTIAL KNEE ARTHROPLASTY Left 07/30/2015   Procedure: UNICOMPARTMENTAL LEFT KNEE MEDIAL;  Surgeon: Gaynelle Arabian, MD;  Location: [facility];  Service: Orthopedics;  Laterality: Left;  . POSTERIOR LUMBAR FUSION  04/2015   rods inserted L2  . REFRACTIVE SURGERY Right 1980s  . SHOULDER OPEN ROTATOR CUFF REPAIR Bilateral 1990s  .  TENNIS ELBOW RELEASE/NIRSCHEL PROCEDURE Right 1980s  . TOTAL KNEE ARTHROPLASTY Right 03/2009  . TOTAL KNEE ARTHROPLASTY WITH REVISION COMPONENTS Left 03/03/2016   Procedure: REVISION LEFT KNEE UNICOMPARTMENTAL ARTHROPLASTY TO TOTAL KNEE ARTHROPLASTY;  Surgeon: Gaynelle Arabian, MD;  Location: [facility];  Service: Orthopedics;  Laterality: Left;  requests 2hrs; Adductor Block    Prior to Admission medications   Medication Sig Start Date End Date Taking? Authorizing Provider  apixaban (ELIQUIS) 5 MG TABS tablet Take 1 tablet (5 mg total) by mouth 2 (two) times daily. 09/01/16  Yes Sherran Needs, NP  atorvastatin (LIPITOR) 80 MG tablet Take 40-80 mg by mouth See admin instructions. Takes 80 mg by  mouth daily on Monday, Wednesday, Fridays and take 40 mg by mouth on all other days of the week   Yes [provider]  Biotin 5000 MCG CAPS Take 5,000 mcg by mouth daily.    Yes [provider]  Cholecalciferol (VITAMIN D) 2000 units tablet Take 2,000 Units by mouth daily.    Yes [provider]  docusate sodium (COLACE) 100 MG capsule Take 100 mg by mouth at bedtime.    Yes [provider]  doxycycline (VIBRAMYCIN) 100 MG capsule Take 100 mg by mouth 2 (two) times daily.   Yes [provider]  finasteride (PROSCAR) 5 MG tablet Take 5 mg by mouth at bedtime.    Yes [provider]  Glucosamine-Chondroitin (GLUCOSAMINE CHONDR COMPLEX PO) Take 1 tablet by mouth daily.   Yes [provider]  levothyroxine (SYNTHROID, LEVOTHROID) 25 MCG tablet Take 25 mcg by mouth daily before breakfast.   Yes [provider]  Methylcellulose, Laxative, (CITRUCEL PO) Take 2 tablets by mouth daily.   Yes [provider]  metoprolol succinate (TOPROL XL) 25 MG 24 hr tablet Take 1 tablet (25 mg total) by mouth 2 (two) times daily. 09/01/16  Yes [name], MD  Multiple Vitamins-Minerals (CENTRUM SILVER PO) Take 1 tablet by mouth daily.    Yes [provider]  Omega-3 Fatty Acids (FISH OIL) 1200 MG CAPS Take 1,200 mg by mouth daily.   Yes [provider]  omeprazole (PRILOSEC) 20 MG capsule Take 20 mg by mouth daily.   Yes [provider]  oxyCODONE (OXY IR/ROXICODONE) 5 MG immediate release tablet Take 1 tablet (5 mg total) by mouth every 4 (four) hours as needed for moderate pain or severe pain. Patient taking differently: Take 5 mg by mouth every 8 (eight) hours as needed for moderate pain or severe pain.  12/09/16  Yes Perkins, Alexzandrew L, PA-C  pioglitazone-metformin (ACTOPLUS MET) 15-500 MG per tablet Take 1 tablet by mouth daily.    Yes [provider]  Saw Palmetto 450 MG CAPS Take 450 mg by  mouth daily.   Yes [provider]  tamsulosin (FLOMAX) 0.4 MG CAPS capsule Take 0.4 mg by mouth daily. 01/20/17  Yes [provider]  vitamin B-12 (CYANOCOBALAMIN) 1000 MCG tablet Take 1,000 mcg by mouth daily.   Yes [provider]   KNEE EXAM antalgic gait, no warmth or effusion, reduced range of motion (12-85 degrees), collateral ligaments intact  Physical Examination: General appearance - alert, well appearing, and in no distress Mental status - alert, oriented to person, place, and time Chest - clear to auscultation, no wheezes, rales or rhonchi, symmetric air entry Heart - normal rate, regular rhythm, normal S1, S2, no murmurs, rubs, clicks or gallops Abdomen - soft, nontender, nondistended, no masses or organomegaly Neurological -  alert, oriented, normal speech, no focal findings or movement disorder noted   Asessment/Plan--- Left knee arthrofibrosis- - Plan left knee closed manipulation. Procedure risks and potential comps discussed with patient who elects to proceed. Goals are decreased pain and increased function with a high likelihood of achieving both

## 2017-02-28 NOTE — Anesthesia Postprocedure Evaluation (Signed)
Anesthesia Post Note  Patient: [name] MelenaGary P Dyer  Procedure(s) Performed: CLOSED MANIPULATION KNEE (Left Knee)     Patient location during evaluation: PACU Anesthesia Type: General Level of consciousness: awake and alert Pain management: pain level controlled Vital Signs Assessment: post-procedure vital signs reviewed and stable Respiratory status: spontaneous breathing, nonlabored ventilation, respiratory function stable and patient connected to nasal cannula oxygen Cardiovascular status: blood pressure returned to baseline and stable Postop Assessment: no apparent nausea or vomiting Anesthetic complications: no    Last Vitals:  Vitals:   02/28/17 1558 02/28/17 1600  BP: (!) 161/72 (!) 157/78  Pulse: (!) 59 (!) 59  Resp: 16   Temp:    SpO2: 100% 100%    Last Pain:  Vitals:   02/28/17 1512  TempSrc: Oral                 Avin Gibbons

## 2017-03-01 ENCOUNTER — Encounter (HOSPITAL_COMMUNITY): Payer: Self-pay | Admitting: Orthopedic Surgery

## 2017-03-05 ENCOUNTER — Other Ambulatory Visit (HOSPITAL_COMMUNITY): Payer: Self-pay | Admitting: Nurse Practitioner

## 2017-03-07 NOTE — Telephone Encounter (Signed)
Rx(s) sent to pharmacy electronically.  

## 2017-04-12 ENCOUNTER — Encounter: Payer: Self-pay | Admitting: Adult Health

## 2017-04-14 ENCOUNTER — Ambulatory Visit: Payer: Medicare Other | Admitting: Adult Health

## 2017-04-14 ENCOUNTER — Encounter: Payer: Self-pay | Admitting: Adult Health

## 2017-04-14 ENCOUNTER — Ambulatory Visit (INDEPENDENT_AMBULATORY_CARE_PROVIDER_SITE_OTHER): Payer: Medicare Other | Admitting: Adult Health

## 2017-04-14 ENCOUNTER — Telehealth: Payer: Self-pay | Admitting: Neurology

## 2017-04-14 VITALS — BP 122/70 | HR 62 | Ht 67.0 in | Wt 217.8 lb

## 2017-04-14 DIAGNOSIS — G4733 Obstructive sleep apnea (adult) (pediatric): Secondary | ICD-10-CM

## 2017-04-14 DIAGNOSIS — Z9989 Dependence on other enabling machines and devices: Secondary | ICD-10-CM

## 2017-04-14 NOTE — Patient Instructions (Signed)
Your Plan:  Continue using CPAP nightly If your symptoms worsen or you develop new symptoms please let us know.   Thank you for coming to see us at [facility]. I hope we have been able to provide you high quality care today.  You may receive a patient satisfaction survey over the next few weeks. We would appreciate your feedback and comments so that we may continue to improve ourselves and the health of our patients.  

## 2017-04-14 NOTE — Telephone Encounter (Signed)
Of course, that's Dr Catalina GravelYans speciality. CD

## 2017-04-14 NOTE — Telephone Encounter (Signed)
This a patient of Dr Vickey Hugerohmeier and is seen for sleep issues. I received a referral for this patient to see you for evaluation for botox. Will you see this patient? I have also sent a message to Dr Vickey Hugerohmeier.

## 2017-04-14 NOTE — Telephone Encounter (Signed)
This is a patient you see for sleep. I received a referral from Dr Gerri SporeAluiso for the patient to see Dr Terrace ArabiaYan for evaluation for botox. Is it ok to schedule with Dr Terrace ArabiaYan? dg
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
## 2017-04-14 NOTE — Telephone Encounter (Signed)
Botox refer is from his orthopedic surgeon. Dr. Lequita HaltAluisio,  It is okay to put him on my schedule for Botox evaluation

## 2017-04-15 NOTE — Progress Notes (Signed)
I agree with the assessment and plan as directed by NP .The patient is known to me .   Ebunoluwa Gernert, MD  

## 2017-04-22 ENCOUNTER — Telehealth: Payer: Self-pay | Admitting: Cardiovascular Disease

## 2017-04-22 NOTE — Telephone Encounter (Signed)
Advised patient of results   # Paroxysmal atrial fibrillation:  [name] had an episode of peri-operative atrial fibrillation.  He has never experienced any recurrent symptoms and in sinus rhythm today.  and at his ointment and atrial fibrillation clinic.  I suspect that this was related to the surgery.  He was also mildly hypothyroid and this is now being treated.  He would like to avoid anticoagulation if possible.  We will get a 30-day event monitor to assess for recurrent atrial fibrillation.  If he has none, we will switch Eliquis to aspirin.  Continue metoprolol.  Advised ok to d/c Eliquis and start ASA 81 mg daily

## 2017-04-22 NOTE — Telephone Encounter (Signed)
-----   Message from Chilton Siiffany Elkin, MD sent at 04/22/2017  1:55 PM EDT ----- Rare PVCs.  No arrhythmias.

## 2017-04-22 NOTE — Telephone Encounter (Signed)
New Message   Patient is calling in reference to the results of his heart monitor. Please call to discuss.

## 2017-05-19 ENCOUNTER — Telehealth: Payer: Self-pay | Admitting: Neurology

## 2017-05-19 ENCOUNTER — Encounter: Payer: Self-pay | Admitting: Neurology

## 2017-05-19 ENCOUNTER — Ambulatory Visit (INDEPENDENT_AMBULATORY_CARE_PROVIDER_SITE_OTHER): Payer: Medicare Other | Admitting: Neurology

## 2017-05-19 VITALS — BP 121/70 | HR 56 | Ht 67.0 in | Wt 217.0 lb

## 2017-05-19 DIAGNOSIS — T84018S Broken internal joint prosthesis, other site, sequela: Secondary | ICD-10-CM

## 2017-05-19 DIAGNOSIS — R252 Cramp and spasm: Secondary | ICD-10-CM

## 2017-05-19 DIAGNOSIS — Z96659 Presence of unspecified artificial knee joint: Secondary | ICD-10-CM | POA: Diagnosis not present

## 2017-05-19 NOTE — Telephone Encounter (Signed)
Error
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
## 2017-06-13 ENCOUNTER — Telehealth: Payer: Self-pay | Admitting: Neurology

## 2017-06-13 NOTE — Telephone Encounter (Signed)
I called [employer] and spoke with Robin. [id number] is covered at 80 percent 64642 and [id number] is covered at 80 percent as well. No pre certification is required and it is eligible for B/B. Ref#[id number]

## 2017-06-13 NOTE — Telephone Encounter (Signed)
I called [facility] to schedule shipment and they stated that this patients pharmacy benefits do not cover the medication, he must be b/b.

## 2017-06-20 ENCOUNTER — Ambulatory Visit (INDEPENDENT_AMBULATORY_CARE_PROVIDER_SITE_OTHER): Payer: Medicare Other | Admitting: Neurology

## 2017-06-20 ENCOUNTER — Encounter: Payer: Self-pay | Admitting: Neurology

## 2017-06-20 VITALS — BP 128/74 | HR 65 | Ht 67.0 in | Wt 223.0 lb

## 2017-06-20 DIAGNOSIS — M24562 Contracture, left knee: Secondary | ICD-10-CM | POA: Diagnosis not present

## 2017-06-20 DIAGNOSIS — G8314 Monoplegia of lower limb affecting left nondominant side: Secondary | ICD-10-CM

## 2017-06-20 MED ORDER — INCOBOTULINUMTOXINA 100 UNITS IM SOLR
400.0000 [IU] | INTRAMUSCULAR | Status: DC
  Administered 2017-06-20: 400 [IU] via INTRAMUSCULAR
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
## 2017-08-15 ENCOUNTER — Telehealth: Payer: Self-pay | Admitting: Neurology

## 2017-08-15 ENCOUNTER — Encounter: Payer: Self-pay | Admitting: *Deleted

## 2017-08-15 DIAGNOSIS — G8314 Monoplegia of lower limb affecting left nondominant side: Secondary | ICD-10-CM | POA: Insufficient documentation

## 2017-08-15 NOTE — Telephone Encounter (Signed)
Per Dr. Terrace ArabiaYan, please request Xeomin 600 units for his next injection.    Returned call. He is aware that Dr. Terrace ArabiaYan has asked for an increased dosage.

## 2017-08-15 NOTE — Telephone Encounter (Signed)
Patient called and requested to move his apt up, we did but he also had some questions regarding the medication. He says that he feels it has already worn off and he wants to know if this is normal. Please call and advise.

## 2017-09-01 NOTE — Telephone Encounter (Signed)
600 unit increase is ok according to [employer] guidelines. DW

## 2017-09-01 NOTE — Telephone Encounter (Signed)
[facility] and requested to check if certification was required. I spoke with Daisy. We checked the codes below;  64642-NPR [id number] Ref#[id number]. DW

## 2017-09-01 NOTE — Telephone Encounter (Signed)
noted 

## 2017-09-14 ENCOUNTER — Encounter: Payer: Self-pay | Admitting: Neurology

## 2017-09-14 ENCOUNTER — Ambulatory Visit (INDEPENDENT_AMBULATORY_CARE_PROVIDER_SITE_OTHER): Payer: Medicare Other | Admitting: Neurology

## 2017-09-14 VITALS — BP 127/80 | HR 67 | Ht 67.0 in | Wt 225.5 lb

## 2017-09-14 DIAGNOSIS — G8314 Monoplegia of lower limb affecting left nondominant side: Secondary | ICD-10-CM

## 2017-09-14 MED ORDER — INCOBOTULINUMTOXINA 100 UNITS IM SOLR
100.0000 [IU] | INTRAMUSCULAR | Status: DC
  Administered 2017-09-14: 100 [IU] via INTRAMUSCULAR

## 2017-09-14 NOTE — Progress Notes (Signed)
**  Xeomin 100 units x 6 vials, NDC 0259-1610-01, Lot [id number], Exp 11/2019, office supply.//mck,rn** 
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
## 2017-10-06 ENCOUNTER — Ambulatory Visit: Payer: Self-pay | Admitting: Neurology

## 2017-12-15 ENCOUNTER — Encounter: Payer: Self-pay | Admitting: Neurology

## 2017-12-15 ENCOUNTER — Telehealth: Payer: Self-pay | Admitting: Neurology

## 2017-12-15 ENCOUNTER — Ambulatory Visit (INDEPENDENT_AMBULATORY_CARE_PROVIDER_SITE_OTHER): Payer: Medicare Other | Admitting: Neurology

## 2017-12-15 VITALS — BP 129/65 | HR 68 | Ht 67.0 in | Wt 223.5 lb

## 2017-12-15 DIAGNOSIS — G8314 Monoplegia of lower limb affecting left nondominant side: Secondary | ICD-10-CM

## 2017-12-15 MED ORDER — INCOBOTULINUMTOXINA 100 UNITS IM SOLR
400.0000 [IU] | INTRAMUSCULAR | Status: AC
  Administered 2017-12-15: 400 [IU] via INTRAMUSCULAR
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
## 2017-12-15 NOTE — Telephone Encounter (Signed)
Do not dissolve xeomin for his next injection in Feb 2020, until I talk with him first.

## 2017-12-15 NOTE — Progress Notes (Signed)
**  Xeomin 100 units x 4 vials, NDC 0259-1610-01, Lot [id number], Exp 02/2020, office supply.//mck,rn**

## 2017-12-15 NOTE — Telephone Encounter (Signed)
noted 

## 2018-03-15 ENCOUNTER — Ambulatory Visit: Payer: Self-pay | Admitting: Neurology

## 2018-03-15 ENCOUNTER — Other Ambulatory Visit (HOSPITAL_COMMUNITY): Payer: Self-pay | Admitting: Cardiovascular Disease

## 2018-04-03 ENCOUNTER — Other Ambulatory Visit: Payer: Self-pay | Admitting: Student

## 2018-04-03 DIAGNOSIS — M5441 Lumbago with sciatica, right side: Principal | ICD-10-CM

## 2018-04-03 DIAGNOSIS — G8929 Other chronic pain: Secondary | ICD-10-CM

## 2018-04-11 ENCOUNTER — Encounter: Payer: Self-pay | Admitting: Neurology

## 2018-04-14 ENCOUNTER — Ambulatory Visit
Admission: RE | Admit: 2018-04-14 | Discharge: 2018-04-14 | Disposition: A | Discharge status: Home / Self Care | Payer: Medicare Other | Source: Ambulatory Visit | Attending: Student | Admitting: Student

## 2018-04-14 DIAGNOSIS — G8929 Other chronic pain: Secondary | ICD-10-CM

## 2018-04-14 DIAGNOSIS — M5441 Lumbago with sciatica, right side: Principal | ICD-10-CM

## 2018-04-14 MED ORDER — GADOBENATE DIMEGLUMINE 529 MG/ML IV SOLN
20.0000 mL | Freq: Once | INTRAVENOUS | Status: AC | PRN
  Administered 2018-04-14: 20 mL via INTRAVENOUS

## 2018-04-17 ENCOUNTER — Ambulatory Visit (INDEPENDENT_AMBULATORY_CARE_PROVIDER_SITE_OTHER): Payer: Medicare Other | Admitting: Neurology

## 2018-04-17 ENCOUNTER — Encounter: Payer: Self-pay | Admitting: Neurology

## 2018-04-17 ENCOUNTER — Other Ambulatory Visit: Payer: Self-pay

## 2018-04-17 VITALS — BP 124/70 | HR 53 | Resp 20 | Ht 67.0 in | Wt 217.0 lb

## 2018-04-17 DIAGNOSIS — G4733 Obstructive sleep apnea (adult) (pediatric): Secondary | ICD-10-CM

## 2018-04-17 DIAGNOSIS — Z9989 Dependence on other enabling machines and devices: Secondary | ICD-10-CM | POA: Diagnosis not present
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
## 2018-04-24 NOTE — Progress Notes (Signed)
NEED ORDERS IN Epic FOR 4-1 SURGERY 

## 2018-05-10 ENCOUNTER — Ambulatory Visit: Admit: 2018-05-10 | Payer: Medicare Other | Admitting: Orthopedic Surgery

## 2018-05-10 SURGERY — ARTHROTOMY, KNEE
Anesthesia: Choice | Laterality: Left

## 2018-05-18 ENCOUNTER — Telehealth: Payer: Self-pay | Admitting: *Deleted

## 2018-05-18 NOTE — Telephone Encounter (Signed)
[employer]  With [facility], Alexsis calling needing to know how many units of Xeomin pt received 09-14-2017.  Per chart 600units. LMVM for Alexsis to return call.

## 2018-05-18 NOTE — Telephone Encounter (Signed)
Spoke to [place] and relayed that pt received 600units of Xeomin on 09-14-2017.  She verbalized understanding.

## 2018-05-29 ENCOUNTER — Telehealth: Payer: Self-pay

## 2018-05-29 NOTE — Telephone Encounter (Signed)
Virtual Visit Pre-Appointment Phone Call  Steps For Call: VERBAL CONSENT BY PHONE   1. Confirm consent - "In the setting of the current Covid19 crisis, you are scheduled for a (phone or video) visit with your provider on (date) at (time).  Just as we do with many in-office visits, in order for you to participate in this visit, we must obtain consent.  If you'd like, I can send this to your mychart (if signed up) or email for you to review.  Otherwise, I can obtain your verbal consent now.  All virtual visits are billed to your insurance company just like a normal visit would be.  By agreeing to a virtual visit, we'd like you to understand that the technology does not allow for your provider to perform an examination, and thus may limit your provider's ability to fully assess your condition. If your provider identifies any concerns that need to be evaluated in person, we will make arrangements to do so.  Finally, though the technology is pretty good, we cannot assure that it will always work on either your or our end, and in the setting of a video visit, we may have to convert it to a phone-only visit.  In either situation, we cannot ensure that we have a secure connection.  Are you willing to proceed?" STAFF: Did the patient verbally acknowledge consent to telehealth visit? Document YES/NO here:   2. Confirm the BEST phone number to call the day of the visit by including in appointment notes  3. Give patient instructions for MyChart download to smartphone OR [email or web address] as below if video visit (depending on what platform provider is using)  4. Confirm that appointment type is correct in Epic appointment notes (VIDEO vs PHONE)  5. Advise patient to be prepared with their blood pressure, heart rate, weight, any heart rhythm information, their current medicines, and a piece of paper and pen handy for any instructions they may receive the day of their visit  6. Inform patient they will receive a  phone call 15 minutes prior to their appointment time (may be from unknown caller ID) so they should be prepared to answer    TELEPHONE CALL NOTE  [name] has been deemed a candidate for a follow-up tele-health visit to limit community exposure during the Covid-19 pandemic. I spoke with the patient via phone to ensure availability of phone/video source, confirm preferred email & phone number, and discuss instructions and expectations.  I reminded [name] to be prepared with any vital sign and/or heart rhythm information that could potentially be obtained via home monitoring, at the time of his visit. I reminded [name] to expect a phone call prior to his visit.  Chana Bode, CMA 05/29/2018 3:31 PM   INSTRUCTIONS FOR DOWNLOADING THE MYCHART APP TO SMARTPHONE  - The patient must first make sure to have activated MyChart and know their login information - If Apple, go to [employer] and type in MyChart in the search bar and download the app. If Android, ask patient to go to [employer] and type in [place] in the search bar and download the app. The app is free but as with any other app downloads, their phone may require them to verify saved payment information or Apple/Android password.  - The patient will need to then log into the app with their MyChart username and password, and select [facility] as their healthcare provider to link the account. When  it is time for your visit, go to the MyChart app, find appointments, and click Begin Video Visit. Be sure to Select Allow for your device to access the Microphone and Camera for your visit. You will then be connected, and your provider will be with you shortly.  **If they have any issues connecting, or need assistance please contact [employer] [phone])  **If using a computer, in order to ensure the best quality for their visit they will need to use either of the following Internet Browsers:  [employer], or Google Chrome  IF USING DOXIMITY or DOXY.ME - The patient will receive a link just prior to their visit by text.     FULL LENGTH CONSENT FOR TELE-HEALTH VISIT   I hereby voluntarily request, consent and authorize [facility] and its employed or contracted physicians, physician assistants, nurse practitioners or other licensed health care professionals (the Practitioner), to provide me with telemedicine health care services (the "Services") as deemed necessary by the treating Practitioner. I acknowledge and consent to receive the Services by the Practitioner via telemedicine. I understand that the telemedicine visit will involve communicating with the Practitioner through live audiovisual communication technology and the disclosure of certain medical information by electronic transmission. I acknowledge that I have been given the opportunity to request an in-person assessment or other available alternative prior to the telemedicine visit and am voluntarily participating in the telemedicine visit.  I understand that I have the right to withhold or withdraw my consent to the use of telemedicine in the course of my care at any time, without affecting my right to future care or treatment, and that the Practitioner or I may terminate the telemedicine visit at any time. I understand that I have the right to inspect all information obtained and/or recorded in the course of the telemedicine visit and may receive copies of available information for a reasonable fee.  I understand that some of the potential risks of receiving the Services via telemedicine include:  [name] Kitchen Delay or interruption in medical evaluation due to technological equipment failure or disruption; . Information transmitted may not be sufficient (e.g. poor resolution of images) to allow for appropriate medical decision making by the Practitioner; and/or  . In rare instances, security protocols could fail, causing a breach of  personal health information.  Furthermore, I acknowledge that it is my responsibility to provide information about my medical history, conditions and care that is complete and accurate to the best of my ability. I acknowledge that Practitioner's advice, recommendations, and/or decision may be based on factors not within their control, such as incomplete or inaccurate data provided by me or distortions of diagnostic images or specimens that may result from electronic transmissions. I understand that the practice of medicine is not an exact science and that Practitioner makes no warranties or guarantees regarding treatment outcomes. I acknowledge that I will receive a copy of this consent concurrently upon execution via email to the email address I last provided but may also request a printed copy by calling the office of [facility].    I understand that my insurance will be billed for this visit.   I have read or had this consent read to me. . I understand the contents of this consent, which adequately explains the benefits and risks of the Services being provided via telemedicine.  . I have been provided ample opportunity to ask questions regarding this consent and the Services and have had my questions answered to my  satisfaction. . I give my informed consent for the services to be provided through the use of telemedicine in my medical care  By participating in this telemedicine visit I agree to the above.

## 2018-05-31 ENCOUNTER — Ambulatory Visit: Payer: Medicare Other | Admitting: Cardiology

## 2018-06-05 ENCOUNTER — Telehealth: Payer: Self-pay | Admitting: Cardiovascular Disease

## 2018-06-05 NOTE — Telephone Encounter (Signed)
Mychart pending, smartphone, pre reg complete 06/05/18 AF °

## 2018-06-06 ENCOUNTER — Encounter: Payer: Self-pay | Admitting: Cardiovascular Disease

## 2018-06-06 ENCOUNTER — Telehealth (INDEPENDENT_AMBULATORY_CARE_PROVIDER_SITE_OTHER): Payer: Medicare Other | Admitting: Cardiovascular Disease

## 2018-06-06 VITALS — Ht 67.0 in | Wt 219.0 lb

## 2018-06-06 DIAGNOSIS — I480 Paroxysmal atrial fibrillation: Secondary | ICD-10-CM

## 2018-06-06 DIAGNOSIS — E7800 Pure hypercholesterolemia, unspecified: Secondary | ICD-10-CM

## 2018-06-06 DIAGNOSIS — Z5181 Encounter for therapeutic drug level monitoring: Secondary | ICD-10-CM

## 2018-06-06 MED ORDER — METOPROLOL SUCCINATE ER 25 MG PO TB24: 25.0000 mg | ORAL_TABLET | Freq: Two times a day (BID) | ORAL | 3 refills | Status: DC

## 2018-06-06 MED ORDER — FENOFIBRATE 150 MG PO CAPS: 150.0000 mg | ORAL_CAPSULE | Freq: Every day | ORAL | 5 refills | Status: DC

## 2018-06-06 NOTE — Patient Instructions (Addendum)
     Medication Instructions:  STOP FISH OIL  START FENOFIBRATE 150 MG DAILY   If you need a refill on your cardiac medications before your next appointment, please call your pharmacy.   Lab work: FASTING LP/CMET IN 3 MONTHS   If you have labs (blood work) drawn today and your tests are completely normal, you will receive your results only by: [name] Kitchen MyChart Message (if you have MyChart) OR . A paper copy in the mail If you have any lab test that is abnormal or we need to change your treatment, we will call you to review the results.  Testing/Procedures: NONE  Follow-Up: At [facility], you and your health needs are our priority.  As part of our continuing mission to provide you with exceptional heart care, we have created designated Provider Care Teams.  These Care Teams include your primary Cardiologist (physician) and Advanced Practice Providers (APPs -  Physician Assistants and Nurse Practitioners) who all work together to provide you with the care you need, when you need it. You will need a follow up appointment in 12 months.  Please call our office 2 months in advance to schedule this appointment.  You may see Chilton Si, MD or one of the following Advanced Practice Providers on your designated Care Team:   Corine Shelter, PA-C Judy Pimple, [place] . Marjie Skiff, PA-C  Any Other Special Instructions Will Be Listed Below (If Applicable).  Call when he knows surgery date

## 2018-06-06 NOTE — Progress Notes (Signed)
Virtual Visit via Video Note   This visit type was conducted due to national recommendations for restrictions regarding the COVID-19 Pandemic (e.g. social distancing) in an effort to limit this patient's exposure and mitigate transmission in our community.  Due to his co-morbid illnesses, this patient is at least at moderate risk for complications without adequate follow up.  This format is felt to be most appropriate for this patient at this time.  The patient did not have access to video technology/had technical difficulties with video requiring transitioning to audio format only (telephone).  All issues noted in this document were discussed and addressed.  No physical exam could be performed with this format.  Please refer to the patient's chart for his  consent to telehealth for [facility].   Evaluation Performed:  Follow-up visit  Date:  06/06/2018   ID:  [name], [name] 1940-06-08, MRN [id number]  Patient Location: Home Provider Location: Office  PCP:  [name], MD  Cardiologist:  [name], MD  Electrophysiologist:  None   Chief Complaint:  Follow up  History of Present Illness:    [name] is a 78 y.o. male with paroxysmal atrial fibrillation, diabetes, hyperlipidemia, and OSA on CPAP, who presents for follow-up. [name] was initially seen 06/21/16 when he developed postoperative atrial fibrillation. He underwent closed manipulation of the left knee and was noted to be in atrial fibrillation in the PACU.  This was his first known episode of atrial fibrillation.  Prior to the episode he noted 2 weeks of exertional dyspnea. He was relatively asymptomatic and was started on metoprolol and Eliquis.  He followed up in atrial fibrillation clinic and was feeling well.  TSH was mildly elevated but T4 was within the normal range.  He was subsequetly started on low dose levothyroxine.  He had an echo 07/02/16 that revealed LVEF 60-65% with grade 1 diastolic dysfunction.   He wore a 30-day event monitor 04/2017 that revealed no atrial fibrillation.  Given that his atrial fibrillation was perioperative Eliquis was discontinued and he was started on aspirin.  [name] has been doing well.  He was scheduled to have knee surgery but this was delayed due to COVID-19.  He struggles with back and knee pain.  He gets back injections but they aren't helping now.  He has been unable to exercise.  He has also been struggling with his diet.  He hasn't experienced any chest pain, shortness of breath, lower extremity edema, orthopnea or PND.  He hasn't had any recurrent atrial fibrillation.  He is unable to check his BP at home.  He notes that he has been out of metoprolol for several days.  The patient does not have symptoms concerning for COVID-19 infection (fever, chills, cough, or new shortness of breath).    Past Medical History:  Diagnosis Date  . 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 Barrett's esophagus with dysplasia   . Chronic lower back pain    DDD  . Closed patellar sleeve fracture of left knee   . Constipation   . Crush injury knee    bilateral  . Dysphagia    EGD with dilitation  . Enlarged prostate    sees a urologist  . GERD (gastroesophageal reflux disease)    takes Omeprazole daily  . Hard of hearing   . Hyperlipidemia    takes Lipitor and Fish Oil daily  . Insomnia    d/t  pain  . Joint pain   . Joint swelling   . Left tibial fracture   . Multiple duodenal ulcers   . Nocturia   . OSA on CPAP    cpap  . Pneumonia    "about 5 years ago"  . Type II diabetes mellitus ([place])    type 2  . Unsteady gait   . Urinary frequency   . Wears glasses    Past Surgical History:  Procedure Laterality Date  . ANTERIOR CERVICAL DECOMP/DISCECTOMY FUSION  10/2010  . ANTERIOR LAT LUMBAR FUSION  12/02/2011   Procedure: ANTERIOR LATERAL LUMBAR FUSION 1 LEVEL;  Surgeon: Eustace Moore, MD;  Location: [facility];   Service: Neurosurgery;  Laterality: Left;  left lumbar three-four  . BACK SURGERY    . COLONOSCOPY  04/2007  . ESOPHAGOGASTRODUODENOSCOPY    . ESOPHAGOGASTRODUODENOSCOPY  04/2007; 07/2008  . ESOPHAGOGASTRODUODENOSCOPY (EGD) WITH ESOPHAGEAL DILATION  "2-3 times"  . EXCISIONAL HEMORRHOIDECTOMY  ~ 1966  . FEMORAL REVISION Left 12/08/2016   Procedure: LEFT KNEE FEMORAL REVISION;  Surgeon: Gaynelle Arabian, MD;  Location: [facility];  Service: Orthopedics;  Laterality: Left;  Adductor Block  . FINGER SURGERY Right    pinky finger  . JOINT REPLACEMENT    . KNEE CLOSED REDUCTION Left 06/21/2016   Procedure: CLOSED MANIPULATION LEFT KNEE;  Surgeon: Gaynelle Arabian, MD;  Location: [facility];  Service: Orthopedics;  Laterality: Left;  . KNEE CLOSED REDUCTION Left 02/28/2017   Procedure: CLOSED MANIPULATION KNEE;  Surgeon: Gaynelle Arabian, MD;  Location: [facility];  Service: Orthopedics;  Laterality: Left;  . KNEE JOINT MANIPULATION  06/2009  . LAPAROSCOPIC CHOLECYSTECTOMY  02/2000  . LUMBAR FUSION  03/16/2012   Dr Ronnald Ramp  . ORIF TIBIA PLATEAU Left 09/24/2015   Procedure: OPEN REDUCTION INTERNAL FIXATION (ORIF) LEFT TIBIAL PLATEAU;  Surgeon: Gaynelle Arabian, MD;  Location: [facility];  Service: Orthopedics;  Laterality: Left;  . PARTIAL KNEE ARTHROPLASTY Left 07/30/2015   Procedure: UNICOMPARTMENTAL LEFT KNEE MEDIAL;  Surgeon: Gaynelle Arabian, MD;  Location: [facility];  Service: Orthopedics;  Laterality: Left;  . POSTERIOR LUMBAR FUSION  04/2015   rods inserted L2  . REFRACTIVE SURGERY Right 1980s  . SHOULDER OPEN ROTATOR CUFF REPAIR Bilateral 1990s  . TENNIS ELBOW RELEASE/NIRSCHEL PROCEDURE Right 1980s  . TOTAL KNEE ARTHROPLASTY Right 03/2009  . TOTAL KNEE ARTHROPLASTY WITH REVISION COMPONENTS Left 03/03/2016   Procedure: REVISION LEFT KNEE UNICOMPARTMENTAL ARTHROPLASTY TO TOTAL KNEE ARTHROPLASTY;  Surgeon: Gaynelle Arabian, MD;  Location: [facility];  Service: Orthopedics;  Laterality: Left;  requests 2hrs; Adductor Block     Current  Meds  Medication Sig  . aspirin EC 81 MG tablet Take 81 mg by mouth daily.  [name] Kitchen atorvastatin (LIPITOR) 80 MG tablet Take 40-80 mg by mouth See admin instructions. Takes 80 mg by mouth daily on Monday, Wednesday, Fridays and take 40 mg by mouth on all other days of the week  . Biotin 5000 MCG CAPS Take 5,000 mcg by mouth daily.   . Calcium Citrate (CITRACAL PO) Take by mouth. Takes daily  . Cholecalciferol (VITAMIN D) 2000 units tablet Take 2,000 Units by mouth daily.   [name] Kitchen doxycycline (VIBRAMYCIN) 100 MG capsule Take 100 mg by mouth daily.   . finasteride (PROSCAR) 5 MG tablet Take 5 mg by mouth at bedtime.   . Glucosamine-Chondroitin (GLUCOSAMINE CHONDR COMPLEX PO) Take 1 tablet by mouth daily.  [name] Kitchen levothyroxine (SYNTHROID, LEVOTHROID) 25 MCG tablet Take 25 mcg by mouth as directed. 2 daily  Monday, Wednesday,, and Friday one daily other days  . Methylcellulose, Laxative, (CITRUCEL PO) Take 2 tablets by mouth daily.  . Multiple Vitamins-Minerals (CENTRUM SILVER PO) Take 1 tablet by mouth daily.   . Omega-3 Fatty Acids (FISH OIL) 1200 MG CAPS Take 1,200 mg by mouth 2 (two) times a day.   [name] Kitchen omeprazole (PRILOSEC) 20 MG capsule Take 20 mg by mouth daily.  [name] Kitchen oxyCODONE (OXY IR/ROXICODONE) 5 MG immediate release tablet Take 1 tablet (5 mg total) by mouth every 4 (four) hours as needed for moderate pain or severe pain. (Patient taking differently: Take 5 mg by mouth every 8 (eight) hours as needed for moderate pain or severe pain. )  . pioglitazone-metformin (ACTOPLUS MET) 15-500 MG per tablet Take 1 tablet by mouth daily.   . Saw Palmetto 450 MG CAPS Take 450 mg by mouth daily.  . tamsulosin (FLOMAX) 0.4 MG CAPS capsule Take 0.4 mg by mouth.   Current Facility-Administered Medications for the 06/06/18 encounter (Telemedicine) with [name], MD  Medication  . incobotulinumtoxinA (XEOMIN) 100 units injection 400 Units     Allergies:   Patient has no known allergies.   Social History   Tobacco  Use  . Smoking status: Former Smoker    Packs/day: 1.50    Years: 15.00    Pack years: 22.50    Types: Cigarettes    Last attempt to quit: 1989    Years since quitting: 31.3  . Smokeless tobacco: Never Used  Substance Use Topics  . Alcohol use: Yes    Comment: beer occasionally  . Drug use: No     Family Hx: The patient's family history includes Diabetes in his brother; Hodgkin's lymphoma in his brother; Lung cancer in his brother, brother, brother, and father; Stomach cancer in his mother.  ROS:   Please see the history of present illness.    All other systems reviewed and are negative.   Prior CV studies:   The following studies were reviewed today:  Echo 07/02/16: Study Conclusions  - Left ventricle: The cavity size was normal. Wall thickness was increased in a pattern of mild LVH. Systolic function was normal. The estimated ejection fraction was in the range of 60% to 65%. Wall motion was normal; there were no regional wall motion abnormalities. Doppler parameters are consistent with abnormal left ventricular relaxation (grade 1 diastolic dysfunction). - Aortic valve: There was no stenosis. - Mitral valve: Mildly calcified annulus. There was trivial regurgitation. - Right ventricle: The cavity size was normal. Systolic function was normal. - Tricuspid valve: Peak RV-RA gradient (S): 26 mm Hg. - Pulmonary arteries: PA peak pressure: 29 mm Hg (S). - Inferior vena cava: The vessel was normal in size. The respirophasic diameter changes were in the normal range (>= 50%), consistent with normal central venous pressure.  Impressions:  - Normal LV size with mild LV hypertrophy, EF 60-65%. Normal RV size and systolic function. No signficant valvular abnormalities.  30 Day Event Monitor 02/2017: Quality: Fair.  Baseline artifact. Predominant rhythm: sinus  Rare PVCs No arrhythmias noted   Labs/Other Tests and Data Reviewed:    EKG:  No  ECG reviewed.  Recent Labs: No results found for requested labs within last 8760 hours.    03/29/18: Total cholesterol 172, triglycerides  321, HDL 36, LDL 72  Recent Lipid Panel No results found for: CHOL, TRIG, HDL, CHOLHDL, LDLCALC, LDLDIRECT  Wt Readings from Last 3 Encounters:  06/06/18 219 lb (99.3 kg)  04/17/18 217 lb (  98.4 kg)  12/15/17 223 lb 8 oz (101.4 kg)     Objective:    Ht 5' 7"  (1.702 m)   Wt 219 lb (99.3 kg)   BMI 34.30 kg/m  GENERAL: Well-appearing.  No acute distress. HEENT: Pupils equal round.  Oral mucosa unremarkable NECK:  No jugular venous distention, no visible thyromegaly EXT:  No edema, no cyanosis no clubbing SKIN:  No rashes no nodules NEURO:  Speech fluent.  Cranial nerves grossly intact.  Moves all 4 extremities freely PSYCH:  Cognitively intact, oriented to person place and time   ASSESSMENT & PLAN:    # Paroxysmal atrial fibrillation:  Mr. Jacober had an episode of peri-operative atrial fibrillation.  He has never experienced any recurrent symptoms.  30 day event monitor showed no arrhythmias and Eliquis was discontinued.  He is planning to have another knee surgery after COVID-19.  We will plan to start amiodarone 242m daily for two weeks pre-operatively and one month post operatively.  Continue aspirin and metoprolol.  # Hyperlipidemia:  # Hypertriglyceridemia: Continue atorvastatin.  LDL is well-controlled but triglycerides are elevated.  Continue atorvastatin.  Stop omega 3 and we will add fenofibrate 1578mdaily.      COVID-19 Education: The signs and symptoms of COVID-19 were discussed with the patient and how to seek care for testing (follow up with PCP or arrange E-visit).  The importance of social distancing was discussed today.  Time:   Today, I have spent 17 minutes with the patient with telehealth technology discussing the above problems.     Medication Adjustments/Labs and Tests Ordered: Current medicines are reviewed at  length with the patient today.  Concerns regarding medicines are outlined above.   Tests Ordered: No orders of the defined types were placed in this encounter.   Medication Changes: No orders of the defined types were placed in this encounter.   Disposition:  Follow up in 1 year(s)  Signed, TiSkeet LatchMD  06/06/2018 2:00 PM    [facility]

## 2018-06-08 ENCOUNTER — Telehealth: Payer: Self-pay | Admitting: *Deleted

## 2018-06-08 NOTE — Telephone Encounter (Signed)
Received PA request on patient medication for Fenofibrate, will forward to Arby [facility]rette doing prior auths at this time

## 2018-06-22 ENCOUNTER — Other Ambulatory Visit: Payer: Self-pay

## 2018-06-22 MED ORDER — FENOFIBRATE 150 MG PO CAPS: 150.0000 mg | ORAL_CAPSULE | Freq: Every day | ORAL | 11 refills | Status: DC

## 2018-06-23 ENCOUNTER — Other Ambulatory Visit: Payer: Self-pay

## 2018-06-23 NOTE — Telephone Encounter (Signed)
REFILLS

## 2018-07-04 ENCOUNTER — Telehealth: Payer: Self-pay

## 2018-07-04 MED ORDER — FENOFIBRATE 145 MG PO TABS: 145.0000 mg | ORAL_TABLET | Freq: Every day | ORAL | 11 refills | Status: DC

## 2018-07-04 NOTE — Telephone Encounter (Signed)
Will forward to Pharm D for review  

## 2018-07-04 NOTE — Telephone Encounter (Signed)
Patients insurance has denied his prescription for Fenofibrate 150 mg capsules. Please advise on another medication for this patient.

## 2018-07-04 NOTE — Telephone Encounter (Signed)
Message from [place], [facility] sent at 07/04/2018 12:55 PM EDT -----  Fenofibrate 150mg  capsule not on formulary. Rx changed to fenofibrate 145mg  tabs

## 2018-07-04 NOTE — Telephone Encounter (Signed)
Do we know reason for denial? PA or insurance coverage?

## 2018-07-14 ENCOUNTER — Telehealth: Payer: Self-pay | Admitting: Cardiovascular Disease

## 2018-07-14 MED ORDER — AMIODARONE HCL 200 MG PO TABS: ORAL_TABLET | ORAL | 0 refills | Status: DC

## 2018-07-14 NOTE — Telephone Encounter (Signed)
Per April telemedicine note: # Paroxysmal atrial fibrillation: [name] had an episode of peri-operative atrial fibrillation. He has never experienced any recurrent symptoms.  30 day event monitor showed no arrhythmias and Eliquis was discontinued.  He is planning to have another knee surgery after COVID-19.  We will plan to start amiodarone 200mg  daily for two weeks pre-operatively and one month post operatively.  Continue aspirin and metoprolol.  Med was not sent to pharmacy at time of visit. Patient has surgery 6/15. Rx(s) sent to pharmacy electronically. Routed to covering MD as Lorain Childes

## 2018-07-14 NOTE — Telephone Encounter (Signed)
Patient called stating Dr. Duke Salvia wanted to prescribe him medication for A-Fib prior to surgery. So he doesn't go into it again. His surgery is scheduled for 6/15

## 2018-07-19 NOTE — Progress Notes (Signed)
LOV [facility] 06-06-18 Epic ECHO 07-02-16 Epic

## 2018-07-19 NOTE — Patient Instructions (Addendum)
[name]   Your procedure is scheduled on: 07-24-2018   Report to [facility] Main  Entrance              Report to admitting at 200 PM   [place] [facility] TEST ON_6/11-20______ @_______ , THIS TEST MUST BE DONE BEFORE SURGERY, COME TO [place].    Call this number if you have problems the morning of surgery [phone]    Remember: [name], NO CHEWING GUM CANDY OR MINTS.   NO SOLID FOOD AFTER MIDNIGHT THE NIGHT PRIOR TO SURGERY. NOTHING BY MOUTH EXCEPT CLEAR LIQUIDS UNTIL  1000 AM. PLEASE FINISH G2 DRINK PER SURGEON ORDER  WHICH NEEDS TO BE COMPLETED AT 430 AM.    CLEAR LIQUID DIET   Foods Allowed                                                                     Foods Excluded  Coffee and tea, regular and decaf                             liquids that you cannot  Plain Jell-O in any flavor                                             see through such as: Fruit ices (not with fruit pulp)                                     milk, soups, orange juice  Iced Popsicles                                    All solid food Carbonated beverages, regular and diet                                    Cranberry, grape and apple juices Sports drinks like Gatorade Lightly seasoned clear broth or consume(fat free) Sugar, honey syrup  Sample Menu Breakfast                                Lunch                                     Supper Cranberry juice                    Beef broth                            Chicken broth Jell-O  Grape juice                           Apple juice Coffee or tea                        Jell-O                                      Popsicle                                                Coffee or tea                        Coffee or  tea  _____________________________________________________________________     Take these medicines the morning of surgery with A SIP OF WATER: OXYCODONE, AMIODARONE (PACERONE), GABAPENTIN (NEURONTIN), ATORVASTATIN (LIPITOR), FENOFIBRATE, METOPROLOL SUCCINATE, TAMSULOSIN (FLOMAX)  DO NOT TAKE ANY DIABETIC MEDICATIONS DAY OF YOUR SURGERY           How to Manage Your Diabetes Before and After Surgery  Why is it important to control my blood sugar before and after surgery? . Improving blood sugar levels before and after surgery helps healing and can limit problems. . A way of improving blood sugar control is eating a healthy diet by: o  Eating less sugar and carbohydrates o  Increasing activity/exercise o  Talking with your doctor about reaching your blood sugar goals . High blood sugars (greater than 180 mg/dL) can raise your risk of infections and slow your recovery, so you will need to focus on controlling your diabetes during the weeks before surgery. . Make sure that the doctor who takes care of your diabetes knows about your planned surgery including the date and location.  How do I manage my blood sugar before surgery? . Check your blood sugar at least 4 times a day, starting 2 days before surgery, to make sure that the level is not too high or low. o Check your blood sugar the morning of your surgery when you wake up and every 2 hours until you get to the Short Stay unit. . If your blood sugar is less than 70 mg/dL, you will need to treat for low blood sugar: o Do not take insulin. o Treat a low blood sugar (less than 70 mg/dL) with  cup of clear juice (cranberry or apple), 4 glucose tablets, OR glucose gel. o Recheck blood sugar in 15 minutes after treatment (to make sure it is greater than 70 mg/dL). If your blood sugar is not greater than 70 mg/dL on recheck, call [phone] for further instructions. . Report your blood sugar to the short stay nurse when you get to Short  Stay.  . If you are admitted to the hospital after surgery: o Your blood sugar will be checked by the staff and you will probably be given insulin after surgery (instead of oral diabetes medicines) to make sure you have good blood sugar levels. o The goal for blood sugar control after surgery is 80-180 mg/dL.   WHAT DO I DO ABOUT MY DIABETES MEDICATION?  [name] Kitchen Do not take oral diabetes medicines (pills) the morning of surgery.  [name] Kitchen THE  DAY  BEFORE [place] MET AS USUAL IN MORNING     THE MORNING OF SURGERY DO NOT TAKE GLIMPERIDE OR ACTO PLUS MET     Reviewed and Endorsed by [employer] Patient Education Committee, August 2015                      You may not have any metal on your body including hair pins and              piercings  Do not wear jewelry, lotions, powders or perfumes, deodorant                        Men may shave face and neck.   Do not bring valuables to the hospital. [place].  Contacts, dentures or bridgework may not be worn into surgery.  Leave suitcase in the car. After surgery it may be brought to your room.                _____________________________________________________________________             [facility] - Preparing for Surgery Before surgery, you can play an important role.  Because skin is not sterile, your skin needs to be as free of germs as possible.  You can reduce the number of germs on your skin by washing with CHG (chlorahexidine gluconate) soap before surgery.  CHG is an antiseptic cleaner which kills germs and bonds with the skin to continue killing germs even after washing. Please DO NOT use if you have an allergy to CHG or antibacterial soaps.  If your skin becomes reddened/irritated stop using the CHG and inform your nurse when you arrive at Short Stay. Do not shave (including legs and underarms) for at least 48 hours prior to the first CHG shower.  You may  shave your face/neck. Please follow these instructions carefully:  1.  Shower with CHG Soap the night before surgery and the  morning of Surgery.  2.  If you choose to wash your hair, wash your hair first as usual with your  normal  shampoo.  3.  After you shampoo, rinse your hair and body thoroughly to remove the  shampoo.                           4.  Use CHG as you would any other liquid soap.  You can apply chg directly  to the skin and wash                       Gently with a scrungie or clean washcloth.  5.  Apply the CHG Soap to your body ONLY FROM THE NECK DOWN.   Do not use on face/ open                           Wound or open sores. Avoid contact with eyes, ears mouth and genitals (private parts).                       Wash face,  Genitals (private parts) with your normal soap.             6.  Wash thoroughly, paying special attention to  the area where your surgery  will be performed.  7.  Thoroughly rinse your body with warm water from the neck down.  8.  DO NOT shower/wash with your normal soap after using and rinsing off  the CHG Soap.                9.  Pat yourself dry with a clean towel.            10.  Wear clean pajamas.            11.  Place clean sheets on your bed the night of your first shower and do not  sleep with pets. Day of Surgery : Do not apply any lotions/deodorants the morning of surgery.  Please wear clean clothes to the hospital/surgery center.  FAILURE TO FOLLOW THESE INSTRUCTIONS MAY RESULT IN THE CANCELLATION OF YOUR SURGERY PATIENT SIGNATURE_________________________________  NURSE SIGNATURE__________________________________  ________________________________________________________________________   Adam Phenix  An incentive spirometer is a tool that can help keep your lungs clear and active. This tool measures how well you are filling your lungs with each breath. Taking long deep breaths may help reverse or decrease the chance of developing  breathing (pulmonary) problems (especially infection) following:  A long period of time when you are unable to move or be active. BEFORE THE PROCEDURE   If the spirometer includes an indicator to show your best effort, your nurse or respiratory therapist will set it to a desired goal.  If possible, sit up straight or lean slightly forward. Try not to slouch.  Hold the incentive spirometer in an upright position. INSTRUCTIONS FOR USE  1. Sit on the edge of your bed if possible, or sit up as far as you can in bed or on a chair. 2. Hold the incentive spirometer in an upright position. 3. Breathe out normally. 4. Place the mouthpiece in your mouth and seal your lips tightly around it. 5. Breathe in slowly and as deeply as possible, raising the piston or the ball toward the top of the column. 6. Hold your breath for 3-5 seconds or for as long as possible. Allow the piston or ball to fall to the bottom of the column. 7. Remove the mouthpiece from your mouth and breathe out normally. 8. Rest for a few seconds and repeat Steps 1 through 7 at least 10 times every 1-2 hours when you are awake. Take your time and take a few normal breaths between deep breaths. 9. The spirometer may include an indicator to show your best effort. Use the indicator as a goal to work toward during each repetition. 10. After each set of 10 deep breaths, practice coughing to be sure your lungs are clear. If you have an incision (the cut made at the time of surgery), support your incision when coughing by placing a pillow or rolled up towels firmly against it. Once you are able to get out of bed, walk around indoors and cough well. You may stop using the incentive spirometer when instructed by your caregiver.  RISKS AND COMPLICATIONS  Take your time so you do not get dizzy or light-headed.  If you are in pain, you may need to take or ask for pain medication before doing incentive spirometry. It is harder to take a deep  breath if you are having pain. AFTER USE  Rest and breathe slowly and easily.  It can be helpful to keep track of a log of your progress. Your caregiver can provide you with  a simple table to help with this. If you are using the spirometer at home, follow these instructions: [place] IF:   You are having difficultly using the spirometer.  You have trouble using the spirometer as often as instructed.  Your pain medication is not giving enough relief while using the spirometer.  You develop fever of 100.5 F (38.1 C) or higher. SEEK IMMEDIATE MEDICAL CARE IF:   You cough up bloody sputum that had not been present before.  You develop fever of 102 F (38.9 C) or greater.  You develop worsening pain at or near the incision site. MAKE SURE YOU:   Understand these instructions.  Will watch your condition.  Will get help right away if you are not doing well or get worse. Document Released: 06/07/2006 Document Revised: 04/19/2011 Document Reviewed: 08/08/2006 Exit[employer] Patient Information 2014 [employer], [place].   ________________________________________________________________________  WHAT IS A BLOOD TRANSFUSION? Blood Transfusion Information  A transfusion is the replacement of blood or some of its parts. Blood is made up of multiple cells which provide different functions.  Red blood cells carry oxygen and are used for blood loss replacement.  White blood cells fight against infection.  Platelets control bleeding.  Plasma helps clot blood.  Other blood products are available for specialized needs, such as hemophilia or other clotting disorders. BEFORE THE TRANSFUSION  Who gives blood for transfusions?   Healthy volunteers who are fully evaluated to make sure their blood is safe. This is blood bank blood. Transfusion therapy is the safest it has ever been in the practice of medicine. Before blood is taken from a donor, a complete history is taken to make sure  that person has no history of diseases nor engages in risky social behavior (examples are intravenous drug use or sexual activity with multiple partners). The donor's travel history is screened to minimize risk of transmitting infections, such as malaria. The donated blood is tested for signs of infectious diseases, such as HIV and hepatitis. The blood is then tested to be sure it is compatible with you in order to minimize the chance of a transfusion reaction. If you or a relative donates blood, this is often done in anticipation of surgery and is not appropriate for emergency situations. It takes many days to process the donated blood. RISKS AND COMPLICATIONS Although transfusion therapy is very safe and saves many lives, the main dangers of transfusion include:   Getting an infectious disease.  Developing a transfusion reaction. This is an allergic reaction to something in the blood you were given. Every precaution is taken to prevent this. The decision to have a blood transfusion has been considered carefully by your caregiver before blood is given. Blood is not given unless the benefits outweigh the risks. AFTER THE TRANSFUSION  Right after receiving a blood transfusion, you will usually feel much better and more energetic. This is especially true if your red blood cells have gotten low (anemic). The transfusion raises the level of the red blood cells which carry oxygen, and this usually causes an energy increase.  The nurse administering the transfusion will monitor you carefully for complications. HOME CARE INSTRUCTIONS  No special instructions are needed after a transfusion. You may find your energy is better. Speak with your caregiver about any limitations on activity for underlying diseases you may have. SEEK MEDICAL CARE IF:   Your condition is not improving after your transfusion.  You develop redness or irritation at the intravenous (IV) site. SEEK IMMEDIATE  MEDICAL CARE IF:  Any of  the following symptoms occur over the next 12 hours:  Shaking chills.  You have a temperature by mouth above 102 F (38.9 C), not controlled by medicine.  Chest, back, or muscle pain.  People around you feel you are not acting correctly or are confused.  Shortness of breath or difficulty breathing.  Dizziness and fainting.  You get a rash or develop hives.  You have a decrease in urine output.  Your urine turns a dark color or changes to pink, red, or brown. Any of the following symptoms occur over the next 10 days:  You have a temperature by mouth above 102 F (38.9 C), not controlled by medicine.  Shortness of breath.  Weakness after normal activity.  The white part of the eye turns yellow (jaundice).  You have a decrease in the amount of urine or are urinating less often.  Your urine turns a dark color or changes to pink, red, or brown. Document Released: 01/23/2000 Document Revised: 04/19/2011 Document Reviewed: 09/11/2007 [facility] Patient Information 2014 [place], [place].  _______________________________________________________________________

## 2018-07-20 ENCOUNTER — Other Ambulatory Visit: Payer: Self-pay

## 2018-07-20 ENCOUNTER — Other Ambulatory Visit (HOSPITAL_COMMUNITY)
Admission: RE | Admit: 2018-07-20 | Discharge: 2018-07-20 | Disposition: A | Discharge status: Home / Self Care | Payer: Medicare Other | Source: Ambulatory Visit | Attending: Orthopedic Surgery | Admitting: Orthopedic Surgery

## 2018-07-20 ENCOUNTER — Encounter (HOSPITAL_COMMUNITY): Payer: Self-pay

## 2018-07-20 ENCOUNTER — Encounter (HOSPITAL_COMMUNITY)
Admission: RE | Admit: 2018-07-20 | Discharge: 2018-07-20 | Disposition: A | Discharge status: Home / Self Care | Payer: Medicare Other | Source: Ambulatory Visit | Attending: Orthopedic Surgery | Admitting: Orthopedic Surgery

## 2018-07-20 DIAGNOSIS — Z1159 Encounter for screening for other viral diseases: Secondary | ICD-10-CM | POA: Insufficient documentation

## 2018-07-20 DIAGNOSIS — Z79899 Other long term (current) drug therapy: Secondary | ICD-10-CM | POA: Insufficient documentation

## 2018-07-20 DIAGNOSIS — E785 Hyperlipidemia, unspecified: Secondary | ICD-10-CM | POA: Diagnosis not present

## 2018-07-20 DIAGNOSIS — Z7984 Long term (current) use of oral hypoglycemic drugs: Secondary | ICD-10-CM | POA: Diagnosis not present

## 2018-07-20 DIAGNOSIS — K219 Gastro-esophageal reflux disease without esophagitis: Secondary | ICD-10-CM | POA: Diagnosis not present

## 2018-07-20 DIAGNOSIS — Z7982 Long term (current) use of aspirin: Secondary | ICD-10-CM | POA: Insufficient documentation

## 2018-07-20 DIAGNOSIS — Z981 Arthrodesis status: Secondary | ICD-10-CM | POA: Diagnosis not present

## 2018-07-20 DIAGNOSIS — Z01818 Encounter for other preprocedural examination: Secondary | ICD-10-CM | POA: Insufficient documentation

## 2018-07-20 DIAGNOSIS — Z87891 Personal history of nicotine dependence: Secondary | ICD-10-CM | POA: Insufficient documentation

## 2018-07-20 DIAGNOSIS — M24662 Ankylosis, left knee: Secondary | ICD-10-CM | POA: Insufficient documentation

## 2018-07-20 DIAGNOSIS — Z79890 Hormone replacement therapy (postmenopausal): Secondary | ICD-10-CM | POA: Insufficient documentation

## 2018-07-20 DIAGNOSIS — E119 Type 2 diabetes mellitus without complications: Secondary | ICD-10-CM | POA: Insufficient documentation

## 2018-07-20 DIAGNOSIS — G4733 Obstructive sleep apnea (adult) (pediatric): Secondary | ICD-10-CM | POA: Diagnosis not present

## 2018-07-20 DIAGNOSIS — R001 Bradycardia, unspecified: Secondary | ICD-10-CM | POA: Diagnosis not present

## 2018-07-20 DIAGNOSIS — I4891 Unspecified atrial fibrillation: Secondary | ICD-10-CM | POA: Insufficient documentation

## 2018-07-20 HISTORY — DX: Cardiac arrhythmia, unspecified: I49.9

## 2018-07-20 LAB — CBC
HCT: 41.4 % (ref 39.0–52.0)
Hemoglobin: 14.2 g/dL (ref 13.0–17.0)
MCH: 36.2 pg — ABNORMAL HIGH (ref 26.0–34.0)
MCHC: 34.3 g/dL (ref 30.0–36.0)
MCV: 105.6 fL — ABNORMAL HIGH (ref 80.0–100.0)
Platelets: 207 10*3/uL (ref 150–400)
RBC: 3.92 MIL/uL — ABNORMAL LOW (ref 4.22–5.81)
RDW: 13.2 % (ref 11.5–15.5)
WBC: 7.3 10*3/uL (ref 4.0–10.5)
nRBC: 0 % (ref 0.0–0.2)

## 2018-07-20 LAB — GLUCOSE, CAPILLARY: Glucose-Capillary: 142 mg/dL — ABNORMAL HIGH (ref 70–99)

## 2018-07-20 LAB — HEMOGLOBIN A1C
Hgb A1c MFr Bld: 7.6 % — ABNORMAL HIGH (ref 4.8–5.6)
Mean Plasma Glucose: 171.42 mg/dL

## 2018-07-21 LAB — NOVEL CORONAVIRUS, NAA (HOSP ORDER, SEND-OUT TO REF LAB; TAT 18-24 HRS): SARS-CoV-2, NAA: NOT DETECTED

## 2018-07-21 NOTE — Progress Notes (Signed)
SPOKE W/  _patient     SCREENING SYMPTOMS OF COVID 19:   COUGH--no  RUNNY NOSE--- no  SORE THROAT---no  NASAL CONGESTION----no  SNEEZING----no  SHORTNESS OF BREATH---no  DIFFICULTY BREATHING---no  TEMP >100.0 -----no  UNEXPLAINED BODY ACHES------no  CHILLS --------no   HEADACHES ---------no  LOSS OF SMELL/ TASTE --------no    HAVE YOU OR ANY FAMILY MEMBER TRAVELLED PAST 14 DAYS OUT OF THE   COUNTY---no STATE----no COUNTRY----no  HAVE YOU OR ANY FAMILY MEMBER BEEN EXPOSED TO ANYONE WITH COVID 19? no    

## 2018-07-21 NOTE — Anesthesia Preprocedure Evaluation (Addendum)
Anesthesia Evaluation  Patient identified by MRN, date of birth, ID band Patient awake    Reviewed: Allergy & Precautions, NPO status , Patient's Chart, lab work & pertinent test results  History of Anesthesia Complications Negative for: history of anesthetic complications  Airway Mallampati: II  TM Distance: >3 FB Neck ROM: Full    Dental  (+) Dental Advisory Given, Teeth Intact   Pulmonary sleep apnea and Continuous Positive Airway Pressure Ventilation , former smoker,    breath sounds clear to auscultation       Cardiovascular (-) angina+ dysrhythmias Atrial Fibrillation  Rhythm:Regular Rate:Normal   Hx perioperative a-fib, resolved. No anticoagulants, no recurrence of a-fib during tele monitoring  '18 TTE - Mild LVH. EF 60% to 65%. Grade 1 diastolic dysfunction. Trivial MR. PASP: 29 mm Hg    Neuro/Psych negative neurological ROS  negative psych ROS   GI/Hepatic Neg liver ROS, GERD  Medicated and Controlled, S/p esophageal dilatation    Endo/Other  diabetes, Type 2, Oral Hypoglycemic Agents Obesity   Renal/GU negative Renal ROS     Musculoskeletal  (+) Arthritis ,   Abdominal   Peds  Hematology negative hematology ROS (+)   Anesthesia Other Findings   Reproductive/Obstetrics                         Anesthesia Physical Anesthesia Plan  ASA: III  Anesthesia Plan: General   Post-op Pain Management:  Regional for Post-op pain   Induction: Intravenous  PONV Risk Score and Plan: 2 and Treatment may vary due to age or medical condition and Ondansetron  Airway Management Planned: LMA  Additional Equipment: None  Intra-op Plan:   Post-operative Plan: Extubation in OR  Informed Consent: I have reviewed the patients History and Physical, chart, labs and discussed the procedure including the risks, benefits and alternatives for the proposed anesthesia with the patient or authorized  representative who has indicated his/her understanding and acceptance.     Dental advisory given  Plan Discussed with: CRNA, Anesthesiologist and Surgeon  Anesthesia Plan Comments:      Anesthesia Quick Evaluation

## 2018-07-21 NOTE — Progress Notes (Signed)
Anesthesia Chart Review   Case: [id number] Date/Time: 07/24/18 1545   Procedure: Left knee arthrotomy with scar excision (Left )   Anesthesia type: Choice   Pre-op diagnosis: arthrofibrosis left knee   Location: [facility] 09 / [facility]   Surgeon: Gaynelle Arabian, MD      DISCUSSION: 78 yo former smoker (22.5 pack years, quit 02/09/87) with h/o HLD, DM II, OSA on CPAP, GERD, A-fib (perioperative a-fib, on ASA), arthrofibrosis left knee scheduled for above procedure 07/24/2018 with Dr. Gaynelle Arabian.   H/o lumbar fusion with hardware L5-S1, L2-L3, hardware well visualized on PACS lumbar xray 01/24/18.   Pt last seen by cardiologist, Dr. [name], via telemedicine 06/06/2018.  Per OV note, "Mr. Mier had an episode of peri-operative atrial fibrillation. He has never experienced any recurrent symptoms.  30 day event monitor showed no arrhythmias and Eliquis was discontinued.  He is planning to have another knee surgery after COVID-19.  We will plan to start amiodarone 234m daily for two weeks pre-operatively and one month post operatively.  Continue aspirin and metoprolol."  Pt can proceed with planned procedure barring acute status change.  VS: BP 129/66 (BP Location: Left Arm)   Pulse 65   Temp 36.9 C (Oral)   Resp 18   Ht 5' 7"  (1.702 m)   Wt 99.1 kg   SpO2 98%   BMI 34.22 kg/m   PROVIDERS: KJani Gravel MD is PCP   RSkeet Latch MD is Cardiologist  LABS: Labs reviewed: Acceptable for surgery. (all labs ordered are listed, but only abnormal results are displayed)  Labs Reviewed  CBC - Abnormal; Notable for the following components:      Result Value   RBC 3.92 (*)    MCV 105.6 (*)    MCH 36.2 (*)    All other components within normal limits  HEMOGLOBIN A1C - Abnormal; Notable for the following components:   Hgb A1c MFr Bld 7.6 (*)    All other components within normal limits  GLUCOSE, CAPILLARY - Abnormal; Notable for the following components:   Glucose-Capillary 142  (*)    All other components within normal limits     IMAGES:   EKG: 07/20/2018 Rate 59 bpm Sinus bradycardia  Otherwise normal ECG   CV: Echo 07/02/16 Study Conclusions  - Left ventricle: The cavity size was normal. Wall thickness was   increased in a pattern of mild LVH. Systolic function was normal.   The estimated ejection fraction was in the range of 60% to 65%.   Wall motion was normal; there were no regional wall motion   abnormalities. Doppler parameters are consistent with abnormal   left ventricular relaxation (grade 1 diastolic dysfunction). - Aortic valve: There was no stenosis. - Mitral valve: Mildly calcified annulus. There was trivial   regurgitation. - Right ventricle: The cavity size was normal. Systolic function   was normal. - Tricuspid valve: Peak RV-RA gradient (S): 26 mm Hg. - Pulmonary arteries: PA peak pressure: 29 mm Hg (S). - Inferior vena cava: The vessel was normal in size. The   respirophasic diameter changes were in the normal range (>= 50%),   consistent with normal central venous pressure.  Impressions:  - Normal LV size with mild LV hypertrophy, EF 60-65%. Normal RV   size and systolic function. No signficant valvular abnormalities. Past Medical History:  Diagnosis Date  . 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 Barrett's esophagus with dysplasia   .  Chronic lower back pain    DDD  . Closed patellar sleeve fracture of left knee   . Constipation   . Crush injury knee    bilateral  . Dysphagia    EGD with dilitation  . Dysrhythmia    thought pt. had a fib after surgery saw cardiology later and ok meds preventive per pt.  . Enlarged prostate    sees a urologist  . GERD (gastroesophageal reflux disease)    takes Omeprazole daily  . Hard of hearing   . Hyperlipidemia    takes Lipitor and Fish Oil daily  . Insomnia    d/t pain  . Joint pain   . Joint swelling   . Left tibial fracture   .  Multiple duodenal ulcers   . Nocturia   . OSA on CPAP    cpap  . Pneumonia    "about 5 years ago"  . Type II diabetes mellitus ([place])    type 2  . Unsteady gait   . Urinary frequency   . Wears glasses     Past Surgical History:  Procedure Laterality Date  . ANTERIOR CERVICAL DECOMP/DISCECTOMY FUSION  10/2010  . ANTERIOR LAT LUMBAR FUSION  12/02/2011   Procedure: ANTERIOR LATERAL LUMBAR FUSION 1 LEVEL;  Surgeon: Eustace Moore, MD;  Location: [facility];  Service: Neurosurgery;  Laterality: Left;  left lumbar three-four  . BACK SURGERY    . COLONOSCOPY  04/2007  . ESOPHAGOGASTRODUODENOSCOPY    . ESOPHAGOGASTRODUODENOSCOPY  04/2007; 07/2008  . ESOPHAGOGASTRODUODENOSCOPY (EGD) WITH ESOPHAGEAL DILATION  "2-3 times"  . EXCISIONAL HEMORRHOIDECTOMY  ~ 1966  . FEMORAL REVISION Left 12/08/2016   Procedure: LEFT KNEE FEMORAL REVISION;  Surgeon: Gaynelle Arabian, MD;  Location: [facility];  Service: Orthopedics;  Laterality: Left;  Adductor Block  . FINGER SURGERY Right    pinky finger  . JOINT REPLACEMENT    . KNEE CLOSED REDUCTION Left 06/21/2016   Procedure: CLOSED MANIPULATION LEFT KNEE;  Surgeon: Gaynelle Arabian, MD;  Location: [facility];  Service: Orthopedics;  Laterality: Left;  . KNEE CLOSED REDUCTION Left 02/28/2017   Procedure: CLOSED MANIPULATION KNEE;  Surgeon: Gaynelle Arabian, MD;  Location: [facility];  Service: Orthopedics;  Laterality: Left;  . KNEE JOINT MANIPULATION  06/2009  . LAPAROSCOPIC CHOLECYSTECTOMY  02/2000  . LUMBAR FUSION  03/16/2012   Dr Ronnald Ramp  . ORIF TIBIA PLATEAU Left 09/24/2015   Procedure: OPEN REDUCTION INTERNAL FIXATION (ORIF) LEFT TIBIAL PLATEAU;  Surgeon: Gaynelle Arabian, MD;  Location: [facility];  Service: Orthopedics;  Laterality: Left;  . PARTIAL KNEE ARTHROPLASTY Left 07/30/2015   Procedure: UNICOMPARTMENTAL LEFT KNEE MEDIAL;  Surgeon: Gaynelle Arabian, MD;  Location: [facility];  Service: Orthopedics;  Laterality: Left;  . POSTERIOR LUMBAR FUSION  04/2015   rods inserted L2  .  REFRACTIVE SURGERY Right 1980s  . SHOULDER OPEN ROTATOR CUFF REPAIR Bilateral 1990s  . TENNIS ELBOW RELEASE/NIRSCHEL PROCEDURE Right 1980s  . TOTAL KNEE ARTHROPLASTY Right 03/2009  . TOTAL KNEE ARTHROPLASTY WITH REVISION COMPONENTS Left 03/03/2016   Procedure: REVISION LEFT KNEE UNICOMPARTMENTAL ARTHROPLASTY TO TOTAL KNEE ARTHROPLASTY;  Surgeon: Gaynelle Arabian, MD;  Location: [facility];  Service: Orthopedics;  Laterality: Left;  requests 2hrs; Adductor Block    MEDICATIONS: . amiodarone (PACERONE) 200 MG tablet  . aspirin EC 81 MG tablet  . atorvastatin (LIPITOR) 80 MG tablet  . cholecalciferol (VITAMIN D3) 25 MCG (1000 UT) tablet  . Cyanocobalamin (VITAMIN B-12) 5000 MCG TBDP  . docusate sodium (COLACE) 100  MG capsule  . doxycycline (VIBRAMYCIN) 100 MG capsule  . fenofibrate (TRICOR) 145 MG tablet  . finasteride (PROSCAR) 5 MG tablet  . gabapentin (NEURONTIN) 300 MG capsule  . glimepiride (AMARYL) 2 MG tablet  . Glucosamine-Chondroitin (GLUCOSAMINE CHONDR COMPLEX PO)  . levothyroxine (SYNTHROID) 50 MCG tablet  . Methylcellulose, Laxative, (CITRUCEL PO)  . metoprolol succinate (TOPROL-XL) 25 MG 24 hr tablet  . Multiple Vitamins-Minerals (CENTRUM SILVER PO)  . Omega-3 Fatty Acids (FISH OIL) 1000 MG CAPS  . omeprazole (PRILOSEC) 20 MG capsule  . oxyCODONE (OXY IR/ROXICODONE) 5 MG immediate release tablet  . pioglitazone-metformin (ACTOPLUS MET) 15-500 MG per tablet  . Saw Palmetto 450 MG CAPS  . Specialty Vitamins Products (BIOTIN PLUS KERATIN PO)  . tamsulosin (FLOMAX) 0.4 MG CAPS capsule   . incobotulinumtoxinA (XEOMIN) 100 units injection 400 Units   [facility] Pre-Surgical Testing [phone] 07/21/18 11:07 AM

## 2018-07-24 ENCOUNTER — Ambulatory Visit (HOSPITAL_COMMUNITY): Payer: Medicare Other | Admitting: Physician Assistant

## 2018-07-24 ENCOUNTER — Ambulatory Visit (HOSPITAL_COMMUNITY): Payer: Medicare Other | Admitting: Anesthesiology

## 2018-07-24 ENCOUNTER — Encounter (HOSPITAL_COMMUNITY)
Admission: RE | Disposition: A | Discharge status: Home / Self Care | Payer: Self-pay | Source: Other Acute Inpatient Hospital | Attending: Orthopedic Surgery

## 2018-07-24 ENCOUNTER — Ambulatory Visit (HOSPITAL_COMMUNITY)
Admission: RE | Admit: 2018-07-24 | Discharge: 2018-07-25 | Disposition: A | Discharge status: Home / Self Care | Payer: Medicare Other | Source: Other Acute Inpatient Hospital | Attending: Orthopedic Surgery | Admitting: Orthopedic Surgery

## 2018-07-24 ENCOUNTER — Encounter (HOSPITAL_COMMUNITY): Payer: Self-pay | Admitting: *Deleted

## 2018-07-24 ENCOUNTER — Other Ambulatory Visit: Payer: Self-pay

## 2018-07-24 DIAGNOSIS — Z8719 Personal history of other diseases of the digestive system: Secondary | ICD-10-CM | POA: Insufficient documentation

## 2018-07-24 DIAGNOSIS — G4733 Obstructive sleep apnea (adult) (pediatric): Secondary | ICD-10-CM | POA: Insufficient documentation

## 2018-07-24 DIAGNOSIS — R2689 Other abnormalities of gait and mobility: Secondary | ICD-10-CM | POA: Insufficient documentation

## 2018-07-24 DIAGNOSIS — Z87891 Personal history of nicotine dependence: Secondary | ICD-10-CM | POA: Diagnosis not present

## 2018-07-24 DIAGNOSIS — N400 Enlarged prostate without lower urinary tract symptoms: Secondary | ICD-10-CM | POA: Insufficient documentation

## 2018-07-24 DIAGNOSIS — Z96653 Presence of artificial knee joint, bilateral: Secondary | ICD-10-CM | POA: Diagnosis not present

## 2018-07-24 DIAGNOSIS — E785 Hyperlipidemia, unspecified: Secondary | ICD-10-CM | POA: Diagnosis not present

## 2018-07-24 DIAGNOSIS — M24662 Ankylosis, left knee: Secondary | ICD-10-CM | POA: Insufficient documentation

## 2018-07-24 DIAGNOSIS — R2681 Unsteadiness on feet: Secondary | ICD-10-CM | POA: Insufficient documentation

## 2018-07-24 DIAGNOSIS — Z7984 Long term (current) use of oral hypoglycemic drugs: Secondary | ICD-10-CM | POA: Diagnosis not present

## 2018-07-24 DIAGNOSIS — K219 Gastro-esophageal reflux disease without esophagitis: Secondary | ICD-10-CM | POA: Insufficient documentation

## 2018-07-24 DIAGNOSIS — T8482XA Fibrosis due to internal orthopedic prosthetic devices, implants and grafts, initial encounter: Secondary | ICD-10-CM | POA: Diagnosis present

## 2018-07-24 DIAGNOSIS — Z79899 Other long term (current) drug therapy: Secondary | ICD-10-CM | POA: Insufficient documentation

## 2018-07-24 DIAGNOSIS — Z79890 Hormone replacement therapy (postmenopausal): Secondary | ICD-10-CM | POA: Diagnosis not present

## 2018-07-24 DIAGNOSIS — E119 Type 2 diabetes mellitus without complications: Secondary | ICD-10-CM | POA: Diagnosis not present

## 2018-07-24 DIAGNOSIS — T8482XD Fibrosis due to internal orthopedic prosthetic devices, implants and grafts, subsequent encounter: Secondary | ICD-10-CM

## 2018-07-24 DIAGNOSIS — Z7982 Long term (current) use of aspirin: Secondary | ICD-10-CM | POA: Diagnosis not present

## 2018-07-24 DIAGNOSIS — G4700 Insomnia, unspecified: Secondary | ICD-10-CM | POA: Diagnosis not present

## 2018-07-24 HISTORY — PX: KNEE ARTHROTOMY: SHX5881

## 2018-07-24 LAB — BASIC METABOLIC PANEL
Anion gap: 7 (ref 5–15)
BUN: 18 mg/dL (ref 8–23)
CO2: 23 mmol/L (ref 22–32)
Calcium: 8.6 mg/dL — ABNORMAL LOW (ref 8.9–10.3)
Chloride: 111 mmol/L (ref 98–111)
Creatinine, Ser: 0.93 mg/dL (ref 0.61–1.24)
GFR calc Af Amer: >60 mL/min (ref >60)
GFR calc non Af Amer: >60 mL/min (ref >60)
Glucose, Bld: 141 mg/dL — ABNORMAL HIGH (ref 70–99)
Potassium: 4.1 mmol/L (ref 3.5–5.1)
Sodium: 141 mmol/L (ref 135–145)

## 2018-07-24 LAB — GLUCOSE, CAPILLARY
Glucose-Capillary: 131 mg/dL — ABNORMAL HIGH (ref 70–99)
Glucose-Capillary: 114 mg/dL — ABNORMAL HIGH (ref 70–99)
Glucose-Capillary: 199 mg/dL — ABNORMAL HIGH (ref 70–99)

## 2018-07-24 SURGERY — ARTHROTOMY, KNEE
Anesthesia: General | Site: Knee | Laterality: Left

## 2018-07-24 MED ORDER — INSULIN ASPART 100 UNIT/ML ~~LOC~~ SOLN
0.0000 [IU] | Freq: Three times a day (TID) | SUBCUTANEOUS | Status: DC
  Administered 2018-07-25 (×2): 2 [IU] via SUBCUTANEOUS

## 2018-07-24 MED ORDER — LACTATED RINGERS IV SOLN
INTRAVENOUS | Status: DC
  Administered 2018-07-24 (×2): via INTRAVENOUS

## 2018-07-24 MED ORDER — MORPHINE SULFATE (PF) 2 MG/ML IV SOLN: 1.0000 mg | INTRAVENOUS | Status: DC | PRN

## 2018-07-24 MED ORDER — GLIMEPIRIDE 2 MG PO TABS
2.0000 mg | ORAL_TABLET | Freq: Every day | ORAL | Status: DC
  Administered 2018-07-25: 2 mg via ORAL
  Filled 2018-07-24: qty 1

## 2018-07-24 MED ORDER — AMIODARONE HCL 200 MG PO TABS
200.0000 mg | ORAL_TABLET | Freq: Every day | ORAL | Status: DC
  Administered 2018-07-25: 200 mg via ORAL
  Filled 2018-07-24: qty 1

## 2018-07-24 MED ORDER — METOPROLOL SUCCINATE ER 25 MG PO TB24
25.0000 mg | ORAL_TABLET | Freq: Two times a day (BID) | ORAL | Status: DC
  Administered 2018-07-24 – 2018-07-25 (×2): 25 mg via ORAL
  Filled 2018-07-24 (×2): qty 1

## 2018-07-24 MED ORDER — DOXYCYCLINE HYCLATE 100 MG PO TABS
100.0000 mg | ORAL_TABLET | Freq: Every day | ORAL | Status: DC
  Administered 2018-07-24: 100 mg via ORAL
  Filled 2018-07-24: qty 1

## 2018-07-24 MED ORDER — OXYCODONE HCL 5 MG PO TABS
5.0000 mg | ORAL_TABLET | ORAL | Status: DC | PRN
  Administered 2018-07-25: 10 mg via ORAL
  Filled 2018-07-24 (×2): qty 2

## 2018-07-24 MED ORDER — FENTANYL CITRATE (PF) 100 MCG/2ML IJ SOLN
50.0000 ug | INTRAMUSCULAR | Status: DC
  Administered 2018-07-24: 50 ug via INTRAVENOUS

## 2018-07-24 MED ORDER — FENTANYL CITRATE (PF) 100 MCG/2ML IJ SOLN
INTRAMUSCULAR | Status: DC | PRN
  Administered 2018-07-24 (×2): 50 ug via INTRAVENOUS

## 2018-07-24 MED ORDER — FENTANYL CITRATE (PF) 100 MCG/2ML IJ SOLN
25.0000 ug | INTRAMUSCULAR | Status: DC | PRN
  Administered 2018-07-24 (×3): 50 ug via INTRAVENOUS

## 2018-07-24 MED ORDER — METHOCARBAMOL 500 MG IVPB - SIMPLE MED
500.0000 mg | Freq: Four times a day (QID) | INTRAVENOUS | Status: DC | PRN
  Administered 2018-07-24: 500 mg via INTRAVENOUS
  Filled 2018-07-24: qty 50

## 2018-07-24 MED ORDER — MENTHOL 3 MG MT LOZG: 1.0000 | LOZENGE | OROMUCOSAL | Status: DC | PRN

## 2018-07-24 MED ORDER — ONDANSETRON HCL 4 MG/2ML IJ SOLN: 4.0000 mg | Freq: Four times a day (QID) | INTRAMUSCULAR | Status: DC | PRN

## 2018-07-24 MED ORDER — METHOCARBAMOL 500 MG PO TABS
500.0000 mg | ORAL_TABLET | Freq: Four times a day (QID) | ORAL | Status: DC | PRN
  Administered 2018-07-24 – 2018-07-25 (×3): 500 mg via ORAL
  Filled 2018-07-24 (×3): qty 1

## 2018-07-24 MED ORDER — TAMSULOSIN HCL 0.4 MG PO CAPS
0.4000 mg | ORAL_CAPSULE | Freq: Every day | ORAL | Status: DC
  Administered 2018-07-24 – 2018-07-25 (×2): 0.4 mg via ORAL
  Filled 2018-07-24 (×2): qty 1

## 2018-07-24 MED ORDER — OXYCODONE HCL 5 MG PO TABS
10.0000 mg | ORAL_TABLET | ORAL | Status: DC | PRN
  Administered 2018-07-24 – 2018-07-25 (×2): 10 mg via ORAL
  Filled 2018-07-24: qty 2

## 2018-07-24 MED ORDER — POLYETHYLENE GLYCOL 3350 17 G PO PACK: 17.0000 g | PACK | Freq: Every day | ORAL | Status: DC | PRN

## 2018-07-24 MED ORDER — FLEET ENEMA 7-19 GM/118ML RE ENEM: 1.0000 | ENEMA | Freq: Once | RECTAL | Status: DC | PRN

## 2018-07-24 MED ORDER — CHLORHEXIDINE GLUCONATE 4 % EX LIQD: 60.0000 mL | Freq: Once | CUTANEOUS | Status: DC

## 2018-07-24 MED ORDER — MIDAZOLAM HCL 2 MG/2ML IJ SOLN: 1.0000 mg | INTRAMUSCULAR | Status: DC

## 2018-07-24 MED ORDER — BUPIVACAINE HCL (PF) 0.25 % IJ SOLN
INTRAMUSCULAR | Status: AC
  Filled 2018-07-24: qty 30

## 2018-07-24 MED ORDER — PHENOL 1.4 % MT LIQD: 1.0000 | OROMUCOSAL | Status: DC | PRN

## 2018-07-24 MED ORDER — DEXAMETHASONE SODIUM PHOSPHATE 10 MG/ML IJ SOLN
INTRAMUSCULAR | Status: AC
  Filled 2018-07-24: qty 1

## 2018-07-24 MED ORDER — FENTANYL CITRATE (PF) 100 MCG/2ML IJ SOLN
INTRAMUSCULAR | Status: AC
  Administered 2018-07-24: 50 ug via INTRAVENOUS
  Filled 2018-07-24: qty 2

## 2018-07-24 MED ORDER — ATORVASTATIN CALCIUM 40 MG PO TABS
40.0000 mg | ORAL_TABLET | ORAL | Status: DC
  Administered 2018-07-25: 40 mg via ORAL

## 2018-07-24 MED ORDER — ONDANSETRON HCL 4 MG/2ML IJ SOLN
INTRAMUSCULAR | Status: DC | PRN
  Administered 2018-07-24: 4 mg via INTRAVENOUS

## 2018-07-24 MED ORDER — POVIDONE-IODINE 10 % EX SWAB: 2.0000 "application " | Freq: Once | CUTANEOUS | Status: DC

## 2018-07-24 MED ORDER — OXYCODONE HCL 5 MG/5ML PO SOLN: 5.0000 mg | Freq: Once | ORAL | Status: DC | PRN

## 2018-07-24 MED ORDER — PANTOPRAZOLE SODIUM 40 MG PO TBEC
40.0000 mg | DELAYED_RELEASE_TABLET | Freq: Every day | ORAL | Status: DC
  Administered 2018-07-25: 40 mg via ORAL
  Filled 2018-07-24: qty 1

## 2018-07-24 MED ORDER — PROPOFOL 10 MG/ML IV BOLUS
INTRAVENOUS | Status: AC
  Filled 2018-07-24: qty 20

## 2018-07-24 MED ORDER — ACETAMINOPHEN 10 MG/ML IV SOLN
1000.0000 mg | Freq: Four times a day (QID) | INTRAVENOUS | Status: DC
  Administered 2018-07-24: 17:00:00 1000 mg via INTRAVENOUS
  Filled 2018-07-24: qty 100

## 2018-07-24 MED ORDER — ASPIRIN EC 325 MG PO TBEC
325.0000 mg | DELAYED_RELEASE_TABLET | Freq: Every day | ORAL | Status: DC
  Administered 2018-07-25: 08:00:00 325 mg via ORAL
  Filled 2018-07-24: qty 1

## 2018-07-24 MED ORDER — ONDANSETRON HCL 4 MG/2ML IJ SOLN: 4.0000 mg | Freq: Once | INTRAMUSCULAR | Status: DC | PRN

## 2018-07-24 MED ORDER — GABAPENTIN 300 MG PO CAPS
300.0000 mg | ORAL_CAPSULE | Freq: Three times a day (TID) | ORAL | Status: DC
  Administered 2018-07-24 – 2018-07-25 (×2): 300 mg via ORAL
  Filled 2018-07-24 (×2): qty 1

## 2018-07-24 MED ORDER — MIDAZOLAM HCL 2 MG/2ML IJ SOLN
INTRAMUSCULAR | Status: AC
  Filled 2018-07-24: qty 2

## 2018-07-24 MED ORDER — DEXAMETHASONE SODIUM PHOSPHATE 10 MG/ML IJ SOLN
INTRAMUSCULAR | Status: DC | PRN
  Administered 2018-07-24: 5 mg via INTRAVENOUS

## 2018-07-24 MED ORDER — METHOCARBAMOL 500 MG IVPB - SIMPLE MED
INTRAVENOUS | Status: AC
  Administered 2018-07-24: 500 mg via INTRAVENOUS
  Filled 2018-07-24: qty 50

## 2018-07-24 MED ORDER — LIDOCAINE 2% (20 MG/ML) 5 ML SYRINGE
INTRAMUSCULAR | Status: AC
  Filled 2018-07-24: qty 5

## 2018-07-24 MED ORDER — LEVOTHYROXINE SODIUM 50 MCG PO TABS: 100.0000 ug | ORAL_TABLET | ORAL | Status: DC

## 2018-07-24 MED ORDER — OXYCODONE HCL 5 MG PO TABS: 5.0000 mg | ORAL_TABLET | Freq: Once | ORAL | Status: DC | PRN

## 2018-07-24 MED ORDER — ONDANSETRON HCL 4 MG/2ML IJ SOLN
INTRAMUSCULAR | Status: AC
  Filled 2018-07-24: qty 2

## 2018-07-24 MED ORDER — METOCLOPRAMIDE HCL 5 MG/ML IJ SOLN: 5.0000 mg | Freq: Three times a day (TID) | INTRAMUSCULAR | Status: DC | PRN

## 2018-07-24 MED ORDER — ATORVASTATIN CALCIUM 40 MG PO TABS: 80.0000 mg | ORAL_TABLET | ORAL | Status: DC

## 2018-07-24 MED ORDER — LIDOCAINE HCL (CARDIAC) PF 100 MG/5ML IV SOSY
PREFILLED_SYRINGE | INTRAVENOUS | Status: DC | PRN
  Administered 2018-07-24: 80 mg via INTRAVENOUS

## 2018-07-24 MED ORDER — FENTANYL CITRATE (PF) 250 MCG/5ML IJ SOLN
INTRAMUSCULAR | Status: AC
  Filled 2018-07-24: qty 5

## 2018-07-24 MED ORDER — ACETAMINOPHEN 500 MG PO TABS
1000.0000 mg | ORAL_TABLET | Freq: Four times a day (QID) | ORAL | Status: DC
  Administered 2018-07-24 – 2018-07-25 (×3): 1000 mg via ORAL
  Filled 2018-07-24 (×3): qty 2

## 2018-07-24 MED ORDER — LEVOTHYROXINE SODIUM 50 MCG PO TABS
50.0000 ug | ORAL_TABLET | ORAL | Status: DC
  Administered 2018-07-25: 50 ug via ORAL

## 2018-07-24 MED ORDER — ONDANSETRON HCL 4 MG PO TABS: 4.0000 mg | ORAL_TABLET | Freq: Four times a day (QID) | ORAL | Status: DC | PRN

## 2018-07-24 MED ORDER — 0.9 % SODIUM CHLORIDE (POUR BTL) OPTIME
TOPICAL | Status: DC | PRN
  Administered 2018-07-24: 1000 mL

## 2018-07-24 MED ORDER — DOCUSATE SODIUM 100 MG PO CAPS
100.0000 mg | ORAL_CAPSULE | Freq: Two times a day (BID) | ORAL | Status: DC
  Administered 2018-07-24 – 2018-07-25 (×2): 100 mg via ORAL
  Filled 2018-07-24 (×2): qty 1

## 2018-07-24 MED ORDER — BISACODYL 10 MG RE SUPP: 10.0000 mg | Freq: Every day | RECTAL | Status: DC | PRN

## 2018-07-24 MED ORDER — CEFAZOLIN SODIUM-DEXTROSE 2-4 GM/100ML-% IV SOLN
2.0000 g | INTRAVENOUS | Status: AC
  Administered 2018-07-24: 2 g via INTRAVENOUS
  Filled 2018-07-24: qty 100

## 2018-07-24 MED ORDER — FINASTERIDE 5 MG PO TABS
5.0000 mg | ORAL_TABLET | Freq: Every evening | ORAL | Status: DC
  Administered 2018-07-24: 5 mg via ORAL
  Filled 2018-07-24: qty 1

## 2018-07-24 MED ORDER — SODIUM CHLORIDE 0.9 % IV SOLN
INTRAVENOUS | Status: DC
  Administered 2018-07-24 – 2018-07-25 (×2): via INTRAVENOUS

## 2018-07-24 MED ORDER — ROPIVACAINE HCL 7.5 MG/ML IJ SOLN
INTRAMUSCULAR | Status: DC | PRN
  Administered 2018-07-24: 20 mL via PERINEURAL

## 2018-07-24 MED ORDER — PROPOFOL 10 MG/ML IV BOLUS
INTRAVENOUS | Status: DC | PRN
  Administered 2018-07-24: 120 mg via INTRAVENOUS

## 2018-07-24 MED ORDER — CEFAZOLIN SODIUM-DEXTROSE 2-4 GM/100ML-% IV SOLN
2.0000 g | Freq: Four times a day (QID) | INTRAVENOUS | Status: AC
  Administered 2018-07-24 – 2018-07-25 (×2): 2 g via INTRAVENOUS
  Filled 2018-07-24 (×2): qty 100

## 2018-07-24 MED ORDER — METOCLOPRAMIDE HCL 5 MG PO TABS: 5.0000 mg | ORAL_TABLET | Freq: Three times a day (TID) | ORAL | Status: DC | PRN

## 2018-07-24 MED ORDER — EPHEDRINE SULFATE 50 MG/ML IJ SOLN
INTRAMUSCULAR | Status: DC | PRN
  Administered 2018-07-24 (×2): 10 mg via INTRAVENOUS
  Administered 2018-07-24: 5 mg via INTRAVENOUS
  Administered 2018-07-24: 10 mg via INTRAVENOUS

## 2018-07-24 MED ORDER — DEXAMETHASONE SODIUM PHOSPHATE 10 MG/ML IJ SOLN
10.0000 mg | Freq: Once | INTRAMUSCULAR | Status: AC
  Administered 2018-07-25: 10 mg via INTRAVENOUS
  Filled 2018-07-24: qty 1

## 2018-07-24 MED ORDER — DIPHENHYDRAMINE HCL 12.5 MG/5ML PO ELIX: 12.5000 mg | ORAL_SOLUTION | ORAL | Status: DC | PRN

## 2018-07-24 MED ORDER — FENTANYL CITRATE (PF) 100 MCG/2ML IJ SOLN
25.0000 ug | INTRAMUSCULAR | Status: DC | PRN
  Administered 2018-07-24 (×2): 50 ug via INTRAVENOUS

## 2018-07-24 SURGICAL SUPPLY — 37 items
BAG SPEC THK2 15X12 ZIP CLS (MISCELLANEOUS) ×1
BAG ZIPLOCK 12X15 (MISCELLANEOUS) ×3 IMPLANT
BANDAGE ACE 6X5 VEL STRL LF (GAUZE/BANDAGES/DRESSINGS) ×3 IMPLANT
CLOSURE WOUND 1/2 X4 (GAUZE/BANDAGES/DRESSINGS) ×1
COVER SURGICAL LIGHT HANDLE (MISCELLANEOUS) ×3 IMPLANT
COVER WAND RF STERILE (DRAPES) IMPLANT
CUFF TOURN SGL QUICK 34 (TOURNIQUET CUFF) ×3
CUFF TRNQT CYL 34X4.125X (TOURNIQUET CUFF) ×1 IMPLANT
DRAPE U-SHAPE 47X51 STRL (DRAPES) ×3 IMPLANT
DRSG ADAPTIC 3X8 NADH LF (GAUZE/BANDAGES/DRESSINGS) ×3 IMPLANT
DRSG PAD ABDOMINAL 8X10 ST (GAUZE/BANDAGES/DRESSINGS) ×3 IMPLANT
DURAPREP 26ML APPLICATOR (WOUND CARE) ×3 IMPLANT
ELECT REM PT RETURN 15FT ADLT (MISCELLANEOUS) ×3 IMPLANT
EVACUATOR 1/8 PVC DRAIN (DRAIN) ×3 IMPLANT
GAUZE SPONGE 4X4 12PLY STRL (GAUZE/BANDAGES/DRESSINGS) ×3 IMPLANT
GLOVE BIO SURGEON STRL SZ7 (GLOVE) ×3 IMPLANT
GLOVE BIO SURGEON STRL SZ8 (GLOVE) ×3 IMPLANT
GLOVE BIOGEL PI IND STRL 7.0 (GLOVE) ×1 IMPLANT
GLOVE BIOGEL PI IND STRL 7.5 (GLOVE) ×1 IMPLANT
GLOVE BIOGEL PI IND STRL 8 (GLOVE) ×1 IMPLANT
GLOVE BIOGEL PI INDICATOR 7.0 (GLOVE) ×2
GLOVE BIOGEL PI INDICATOR 7.5 (GLOVE) ×2
GLOVE BIOGEL PI INDICATOR 8 (GLOVE) ×2
GLOVE SURG SS PI 7.0 STRL IVOR (GLOVE) ×3 IMPLANT
GLOVE SURG SS PI 7.5 STRL IVOR (GLOVE) ×3 IMPLANT
GOWN STRL REUS W/TWL LRG LVL3 (GOWN DISPOSABLE) ×9 IMPLANT
KIT TURNOVER KIT A (KITS) ×3 IMPLANT
MANIFOLD NEPTUNE II (INSTRUMENTS) ×3 IMPLANT
PACK TOTAL KNEE CUSTOM (KITS) ×3 IMPLANT
PADDING CAST COTTON 6X4 STRL (CAST SUPPLIES) ×3 IMPLANT
PROTECTOR NERVE ULNAR (MISCELLANEOUS) ×3 IMPLANT
STRIP CLOSURE SKIN 1/2X4 (GAUZE/BANDAGES/DRESSINGS) ×2 IMPLANT
SUT MNCRL AB 4-0 PS2 18 (SUTURE) ×3 IMPLANT
SUT STRATAFIX 0 PDS 27 VIOLET (SUTURE) ×3
SUT VIC AB 2-0 CT1 27 (SUTURE) ×4
SUT VIC AB 2-0 CT1 TAPERPNT 27 (SUTURE) ×2 IMPLANT
SUTURE STRATFX 0 PDS 27 VIOLET (SUTURE) ×1 IMPLANT

## 2018-07-24 NOTE — Progress Notes (Signed)
AssistedDr. Brock with left, ultrasound guided, adductor canal block. Side rails up, monitors on throughout procedure. See vital signs in flow sheet. Tolerated Procedure well.  

## 2018-07-24 NOTE — Anesthesia Procedure Notes (Signed)
Anesthesia Regional Block: Adductor canal block   Pre-Anesthetic Checklist: ,, timeout performed, Correct Patient, Correct Site, Correct Laterality, Correct Procedure, Correct Position, site marked, Risks and benefits discussed,  Surgical consent,  Pre-op evaluation,  At surgeon's request and post-op pain management  Laterality: Left  Prep: chloraprep       Needles:  Injection technique: Single-shot  Needle Type: Echogenic Needle     Needle Length: 10cm  Needle Gauge: 21     Additional Needles:   Narrative:  Start time: 07/24/2018 3:04 PM End time: 07/24/2018 3:08 PM Injection made incrementally with aspirations every 5 mL.  Performed by: Personally  Anesthesiologist: Audry Pili, MD  Additional Notes: No pain on injection. No increased resistance to injection. Injection made in 5cc increments. Good needle visualization. Patient tolerated the procedure well.

## 2018-07-24 NOTE — Brief Op Note (Signed)
07/24/2018  5:00 PM  PATIENT:  [name]  78 y.o. male  PRE-OPERATIVE DIAGNOSIS:  arthrofibrosis left knee  POST-OPERATIVE DIAGNOSIS:  arthrofibrosis left knee  PROCEDURE:  Procedure(s): Left knee arthrotomy with scar excision (Left)  SURGEON:  Surgeon(s) and Role:    Gaynelle Arabian, MD - Primary  PHYSICIAN ASSISTANT:   ASSISTANTS: Griffith Citron, PA-C   ANESTHESIA:   regional and general  EBL: 25 ml  BLOOD ADMINISTERED:none  DRAINS: (Medium) Hemovact drain(s) in the left knee with  Suction Open   LOCAL MEDICATIONS USED:  NONE  COUNTS:  YES  TOURNIQUET:   Total Tourniquet Time Documented: Thigh (Left) - 29 minutes Total: Thigh (Left) - 29 minutes   DICTATION: .Other Dictation: Dictation Number [id number]  PLAN OF CARE: Admit for overnight observation  PATIENT DISPOSITION:  PACU - hemodynamically stable.

## 2018-07-24 NOTE — Transfer of Care (Signed)
Immediate Anesthesia Transfer of Care Note  Patient: [name]  Procedure(s) Performed: Left knee arthrotomy with scar excision (Left Knee)  Patient Location: PACU  Anesthesia Type:GA combined with regional for post-op pain  Level of Consciousness: drowsy and patient cooperative  Airway & Oxygen Therapy: Patient Spontanous Breathing and Patient connected to face mask oxygen  Post-op Assessment: Report given to RN and Post -op Vital signs reviewed and stable  Post vital signs: Reviewed and stable  Last Vitals:  Vitals Value Taken Time  BP 119/52 07/24/18 1731  Temp    Pulse 76 07/24/18 1733  Resp 13 07/24/18 1733  SpO2 100 % 07/24/18 1733  Vitals shown include unvalidated device data.  Last Pain:  Vitals:   07/24/18 1524  TempSrc:   PainSc: 0-No pain         Complications: No apparent anesthesia complications

## 2018-07-24 NOTE — Discharge Instructions (Signed)
° °Dr. Frank Aluisio °Total Joint Specialist °[facility] °[address] °[place], [place] [address] °[phone] ° °TOTAL KNEE REPLACEMENT POSTOPERATIVE DIRECTIONS ° °Knee Rehabilitation, Guidelines Following Surgery  °Results after knee surgery are often greatly improved when you follow the exercise, range of motion and muscle strengthening exercises prescribed by your doctor. Safety measures are also important to protect the knee from further injury. Any time any of these exercises cause you to have increased pain or swelling in your knee joint, decrease the amount until you are comfortable again and slowly increase them. If you have problems or questions, call your caregiver or physical therapist for advice.  ° °HOME CARE INSTRUCTIONS  °• Remove items at home which could result in a fall. This includes throw rugs or furniture in walking pathways.  °· ICE to the affected knee every three hours for 30 minutes at a time and then as needed for pain and swelling.  Continue to use ice on the knee for pain and swelling from surgery. You may notice swelling that will progress down to the foot and ankle.  This is normal after surgery.  Elevate the leg when you are not up walking on it.   °· Continue to use the breathing machine which will help keep your temperature down.  It is common for your temperature to cycle up and down following surgery, especially at night when you are not up moving around and exerting yourself.  The breathing machine keeps your lungs expanded and your temperature down. °· Do not place pillow under knee, focus on keeping the knee straight while resting ° °DIET °You may resume your previous home diet once your are discharged from the hospital. ° °DRESSING / WOUND CARE / SHOWERING °You may change your dressing 3-5 days after surgery.  Then change the dressing every day with sterile gauze.  Please use good hand washing techniques before changing the dressing.  Do not use any lotions  or creams on the incision until instructed by your surgeon. °You may start showering once you are discharged home but do not submerge the incision under water. Just pat the incision dry and apply a dry gauze dressing on daily. °Change the surgical dressing daily and reapply a dry dressing each time. ° °ACTIVITY °Walk with your walker as instructed. °Use walker as long as suggested by your caregivers. °Avoid periods of inactivity such as sitting longer than an hour when not asleep. This helps prevent blood clots.  °You may resume a sexual relationship in one month or when given the OK by your doctor.  °You may return to work once you are cleared by your doctor.  °Do not drive a car for 6 weeks or until released by you surgeon.  °Do not drive while taking narcotics. ° °WEIGHT BEARING °Weight bearing as tolerated with assist device (walker, cane, etc) as directed, use it as long as suggested by your surgeon or therapist, typically at least 4-6 weeks. ° °POSTOPERATIVE CONSTIPATION PROTOCOL °Constipation - defined medically as fewer than three stools per week and severe constipation as less than one stool per week. ° °One of the most common issues patients have following surgery is constipation.  Even if you have a regular bowel pattern at home, your normal regimen is likely to be disrupted due to multiple reasons following surgery.  Combination of anesthesia, postoperative narcotics, change in appetite and fluid intake all can affect your bowels.  In order to avoid complications following surgery, here are some   recommendations in order to help you during your recovery period. ° °Colace (docusate) - Pick up an over-the-counter form of Colace or another stool softener and take twice a day as long as you are requiring postoperative pain medications.  Take with a full glass of water daily.  If you experience loose stools or diarrhea, hold the colace until you stool forms back up.  If your symptoms do not get better within 1  week or if they get worse, check with your doctor. ° °Dulcolax (bisacodyl) - Pick up over-the-counter and take as directed by the product packaging as needed to assist with the movement of your bowels.  Take with a full glass of water.  Use this product as needed if not relieved by Colace only.  ° °MiraLax (polyethylene glycol) - Pick up over-the-counter to have on hand.  MiraLax is a solution that will increase the amount of water in your bowels to assist with bowel movements.  Take as directed and can mix with a glass of water, juice, soda, coffee, or tea.  Take if you go more than two days without a movement. °Do not use MiraLax more than once per day. Call your doctor if you are still constipated or irregular after using this medication for 7 days in a row. ° °If you continue to have problems with postoperative constipation, please contact the office for further assistance and recommendations.  If you experience "the worst abdominal pain ever" or develop nausea or vomiting, please contact the office immediatly for further recommendations for treatment. ° °ITCHING °If you experience itching with your medications, try taking only a single pain pill, or even half a pain pill at a time.  You can also use Benadryl over the counter for itching or also to help with sleep.  ° °TED HOSE STOCKINGS °Wear the elastic stockings on both legs for three weeks following surgery during the day but you may remove then at night for sleeping. ° °MEDICATIONS °See your medication summary on the “After Visit Summary” that the nursing staff will review with you prior to discharge.  You may have some home medications which will be placed on hold until you complete the course of blood thinner medication.  It is important for you to complete the blood thinner medication as prescribed by your surgeon.  Continue your approved medications as instructed at time of discharge. ° °PRECAUTIONS °If you experience chest pain or shortness of breath -  call 911 immediately for transfer to the hospital emergency department.  °If you develop a fever greater that 101 F, purulent drainage from wound, increased redness or drainage from wound, foul odor from the wound/dressing, or calf pain - CONTACT YOUR SURGEON.   °                                                °FOLLOW-UP APPOINTMENTS °Make sure you keep all of your appointments after your operation with your surgeon and caregivers. You should call the office at the above phone number and make an appointment for approximately two weeks after the date of your surgery or on the date instructed by your surgeon outlined in the "After Visit Summary". ° °RANGE OF MOTION AND STRENGTHENING EXERCISES  °Rehabilitation of the knee is important following a knee injury or an operation. After just a few days of immobilization, the muscles of   the thigh which control the knee become weakened and shrink (atrophy). Knee exercises are designed to build up the tone and strength of the thigh muscles and to improve knee motion. Often times heat used for twenty to thirty minutes before working out will loosen up your tissues and help with improving the range of motion but do not use heat for the first two weeks following surgery. These exercises can be done on a training (exercise) mat, on the floor, on a table or on a bed. Use what ever works the best and is most comfortable for you Knee exercises include:  °• Leg Lifts - While your knee is still immobilized in a splint or cast, you can do straight leg raises. Lift the leg to 60 degrees, hold for 3 sec, and slowly lower the leg. Repeat 10-20 times 2-3 times daily. Perform this exercise against resistance later as your knee gets better.  °• Quad and Hamstring Sets - Tighten up the muscle on the front of the thigh (Quad) and hold for 5-10 sec. Repeat this 10-20 times hourly. Hamstring sets are done by pushing the foot backward against an object and holding for 5-10 sec. Repeat as with quad  sets.  °· Leg Slides: Lying on your back, slowly slide your foot toward your buttocks, bending your knee up off the floor (only go as far as is comfortable). Then slowly slide your foot back down until your leg is flat on the floor again. °· Angel Wings: Lying on your back spread your legs to the side as far apart as you can without causing discomfort.  °A rehabilitation program following serious knee injuries can speed recovery and prevent re-injury in the future due to weakened muscles. Contact your doctor or a physical therapist for more information on knee rehabilitation.  ° °IF YOU ARE TRANSFERRED TO A SKILLED REHAB FACILITY °If the patient is transferred to a skilled rehab facility following release from the hospital, a list of the current medications will be sent to the facility for the patient to continue.  When discharged from the skilled rehab facility, please have the facility set up the patient's Home Health Physical Therapy prior to being released. Also, the skilled facility will be responsible for providing the patient with their medications at time of release from the facility to include their pain medication, the muscle relaxants, and their blood thinner medication. If the patient is still at the rehab facility at time of the two week follow up appointment, the skilled rehab facility will also need to assist the patient in arranging follow up appointment in our office and any transportation needs. ° °MAKE SURE YOU:  °• Understand these instructions.  °• Get help right away if you are not doing well or get worse.  ° ° °Pick up stool softner and laxative for home use following surgery while on pain medications. °Do not submerge incision under water. °Please use good hand washing techniques while changing dressing each day. °May shower starting three days after surgery. °Please use a clean towel to pat the incision dry following showers. °Continue to use ice for pain and swelling after surgery. °Do not  use any lotions or creams on the incision until instructed by your surgeon. ° °

## 2018-07-24 NOTE — Op Note (Signed)
NAMEKOLYN, ROZARIO MEDICAL RECORD [id number] ACCOUNT [id number] DATE OF BIRTH:01-11-1941 FACILITY: [facility] LOCATION: [facility] PHYSICIAN:Seven Dollens Zella Ball, MD  OPERATIVE REPORT  DATE OF PROCEDURE:  07/24/2018  PREOPERATIVE DIAGNOSIS:  Arthrofibrosis, left knee.  POSTOPERATIVE DIAGNOSIS:  Arthrofibrosis, left knee.  PROCEDURE:  Left knee arthrotomy and scar excision.  SURGEON:  Gaynelle Arabian, MD  ASSISTANT:  Griffith Citron, PA-C  ANESTHESIA:  General and adductor canal block.  ESTIMATED BLOOD LOSS:  Minimal.  DRAINS:  Hemovac x1.  COMPLICATIONS:  None.  CONDITION:  Stable to recovery.  TOURNIQUET TIME:  29 minutes at 300 mmHg.  BRIEF CLINICAL NOTE:  The patient is a 78 year old male with complex history with regards to his right knee.  At this point, he has a stiff revision knee.  He has had extensive physical therapy.  He has had manipulation.  He presents now for arthrotomy  and scar excision.  PROCEDURE IN DETAIL:  After successful administration of adductor canal block and general anesthetic, a tourniquet was placed on the left thigh and left lower extremity prepped and draped in the usual sterile fashion.  Exam under anesthesia shows range  of motion 20-90 degrees.  The extremity was wrapped in Esmarch and tourniquet inflated to 300 mmHg.  Midline incision is made with a 10 blade through subcutaneous tissue.  There was a lot of scarring subcutaneously, so I released this to create large  subcutaneous flaps.  A fresh knife blade used to make a medial parapatellar arthrotomy.  There was dense scar tissue throughout the joint.  I started excising the scar medially to recreate the medial gutter and removed a large amount of scar doing so.   Once the gutters were recreated, I placed the tension laterally.  A tremendous amount of scar, especially in the infrapatellar region which is released.  This allowed me to sublux the patella.  I removed a large amount of scar from under the  quadriceps  tendon.  The component was covered in scar and that was removed also.  Once I felt that adequate scar was removed.  Then, I was able to flex the knee up to about 115 to 120 degrees and the patella tracked normally.  Extension also improved to about 10  degrees from 20.  Wound was then copiously irrigated with saline solution.  The knee was closed over a Hemovac drain in flexion.  Arthrotomy was closed with a  running 0-Stratafix suture.  Flexion against gravity was 115 degrees.  Tourniquet was  released.  Total time of 29 minutes.    A second limb of the drain was placed subcutaneously.  Subcutaneous was closed with interrupted 2-0 Vicryl, subcuticular running 4-0 Monocryl.  The incisions were cleaned and dried and Steri-Strips and a bulky sterile dressing are applied.  He was then  awakened and transported to recovery in stable condition.  Note that a surgical assistant was a medical necessity for this procedure.  Assistance was necessary for retraction of vital ligaments and neurovascular structures and for assisting in exposure of this very difficult to expose the knee.  AN/NUANCE  D:07/24/2018 T:07/24/2018 JOB:[id number]

## 2018-07-24 NOTE — Anesthesia Postprocedure Evaluation (Signed)
Anesthesia Post Note  Patient: [name]  Procedure(s) Performed: Left knee arthrotomy with scar excision (Left Knee)     Patient location during evaluation: PACU Anesthesia Type: General Level of consciousness: awake and alert Pain management: pain level controlled Vital Signs Assessment: post-procedure vital signs reviewed and stable Respiratory status: spontaneous breathing, nonlabored ventilation and respiratory function stable Cardiovascular status: blood pressure returned to baseline and stable Postop Assessment: no apparent nausea or vomiting Anesthetic complications: no    Last Vitals:  Vitals:   07/24/18 1815 07/24/18 1830  BP: (!) 110/58 (!) 108/59  Pulse: 70 67  Resp: 16 14  Temp:  36.6 C  SpO2: 97% 98%    Last Pain:  Vitals:   07/24/18 1830  TempSrc:   PainSc: 4     LLE Motor Response: Purposeful movement (07/24/18 1830) LLE Sensation: Full sensation;Pain (07/24/18 1830)          Audry Pili

## 2018-07-24 NOTE — H&P (Signed)
CC- [name] is a 78 y.o. male who presents with left knee stiffness.  HPI- . Knee Pain: Patient presents with stiffness involving the  left knee. Onset of the symptoms was several months ago. Inciting event: he had a revision left knee replacement and has had significan stiffness sinc. He has not responded to extensive physial therapy or manipulation. He presents now fpr arthrotomy and scar excision.. Current symptoms include stiffness.   Past Medical History:  Diagnosis Date  . 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 Barrett's esophagus with dysplasia   . Chronic lower back pain    DDD  . Closed patellar sleeve fracture of left knee   . Constipation   . Crush injury knee    bilateral  . Dysphagia    EGD with dilitation  . Dysrhythmia    thought pt. had a fib after surgery saw cardiology later and ok meds preventive per pt.  . Enlarged prostate    sees a urologist  . GERD (gastroesophageal reflux disease)    takes Omeprazole daily  . Hard of hearing   . Hyperlipidemia    takes Lipitor and Fish Oil daily  . Insomnia    d/t pain  . Joint pain   . Joint swelling   . Left tibial fracture   . Multiple duodenal ulcers   . Nocturia   . OSA on CPAP    cpap  . Pneumonia    "about 5 years ago"  . Type II diabetes mellitus ([place])    type 2  . Unsteady gait   . Urinary frequency   . Wears glasses     Past Surgical History:  Procedure Laterality Date  . ANTERIOR CERVICAL DECOMP/DISCECTOMY FUSION  10/2010  . ANTERIOR LAT LUMBAR FUSION  12/02/2011   Procedure: ANTERIOR LATERAL LUMBAR FUSION 1 LEVEL;  Surgeon: Eustace Moore, MD;  Location: [facility];  Service: Neurosurgery;  Laterality: Left;  left lumbar three-four  . BACK SURGERY    . COLONOSCOPY  04/2007  . ESOPHAGOGASTRODUODENOSCOPY    . ESOPHAGOGASTRODUODENOSCOPY  04/2007; 07/2008  . ESOPHAGOGASTRODUODENOSCOPY (EGD) WITH ESOPHAGEAL DILATION  "2-3 times"  . EXCISIONAL HEMORRHOIDECTOMY   ~ 1966  . FEMORAL REVISION Left 12/08/2016   Procedure: LEFT KNEE FEMORAL REVISION;  Surgeon: Gaynelle Arabian, MD;  Location: [facility];  Service: Orthopedics;  Laterality: Left;  Adductor Block  . FINGER SURGERY Right    pinky finger  . JOINT REPLACEMENT    . KNEE CLOSED REDUCTION Left 06/21/2016   Procedure: CLOSED MANIPULATION LEFT KNEE;  Surgeon: Gaynelle Arabian, MD;  Location: [facility];  Service: Orthopedics;  Laterality: Left;  . KNEE CLOSED REDUCTION Left 02/28/2017   Procedure: CLOSED MANIPULATION KNEE;  Surgeon: Gaynelle Arabian, MD;  Location: [facility];  Service: Orthopedics;  Laterality: Left;  . KNEE JOINT MANIPULATION  06/2009  . LAPAROSCOPIC CHOLECYSTECTOMY  02/2000  . LUMBAR FUSION  03/16/2012   Dr Ronnald Ramp  . ORIF TIBIA PLATEAU Left 09/24/2015   Procedure: OPEN REDUCTION INTERNAL FIXATION (ORIF) LEFT TIBIAL PLATEAU;  Surgeon: Gaynelle Arabian, MD;  Location: [facility];  Service: Orthopedics;  Laterality: Left;  . PARTIAL KNEE ARTHROPLASTY Left 07/30/2015   Procedure: UNICOMPARTMENTAL LEFT KNEE MEDIAL;  Surgeon: Gaynelle Arabian, MD;  Location: [facility];  Service: Orthopedics;  Laterality: Left;  . POSTERIOR LUMBAR FUSION  04/2015   rods inserted L2  . REFRACTIVE SURGERY Right 1980s  . SHOULDER OPEN ROTATOR CUFF REPAIR Bilateral 1990s  .  TENNIS ELBOW RELEASE/NIRSCHEL PROCEDURE Right 1980s  . TOTAL KNEE ARTHROPLASTY Right 03/2009  . TOTAL KNEE ARTHROPLASTY WITH REVISION COMPONENTS Left 03/03/2016   Procedure: REVISION LEFT KNEE UNICOMPARTMENTAL ARTHROPLASTY TO TOTAL KNEE ARTHROPLASTY;  Surgeon: Gaynelle Arabian, MD;  Location: [facility];  Service: Orthopedics;  Laterality: Left;  requests 2hrs; Adductor Block    Prior to Admission medications   Medication Sig Start Date End Date Taking? Authorizing Provider  amiodarone (PACERONE) 200 MG tablet Take 1 tablet (24m) by mouth daily 2 weeks prior to surgery & for 1 month after surgery. Patient taking differently: Take 200 mg by mouth daily.  07/14/18  Yes RSkeet Latch MD  aspirin EC 81 MG tablet Take 81 mg by mouth every evening.    Yes [provider]  atorvastatin (LIPITOR) 80 MG tablet Take 40-80 mg by mouth See admin instructions. Takes 80 mg by mouth daily on Monday, Wednesday, Fridays and take 40 mg by mouth on all other days of the week   Yes [provider]  cholecalciferol (VITAMIN D3) 25 MCG (1000 UT) tablet Take 1,000 Units by mouth daily.    Yes [provider]  Cyanocobalamin (VITAMIN B-12) 5000 MCG TBDP Take 5,000 mcg by mouth daily.   Yes [provider]  docusate sodium (COLACE) 100 MG capsule Take 200 mg by mouth at bedtime.   Yes [provider]  doxycycline (VIBRAMYCIN) 100 MG capsule Take 100 mg by mouth at bedtime.    Yes [provider]  finasteride (PROSCAR) 5 MG tablet Take 5 mg by mouth every evening.    Yes [provider]  gabapentin (NEURONTIN) 300 MG capsule Take 300 mg by mouth 3 (three) times daily.   Yes [provider]  glimepiride (AMARYL) 2 MG tablet Take 2 mg by mouth daily with breakfast.   Yes [provider]  Glucosamine-Chondroitin (GLUCOSAMINE CHONDR COMPLEX PO) Take 1 tablet by mouth daily.   Yes [provider]  levothyroxine (SYNTHROID) 50 MCG tablet Take 50-100 mcg by mouth See admin instructions. Take 100 mcg by mouth daily on Monday, Wednesday and Friday. Take 50 mcg by mouth daily on all other days.   Yes [provider]  Methylcellulose, Laxative, (CITRUCEL PO) Take 2 tablets by mouth daily.   Yes [provider]  metoprolol succinate (TOPROL-XL) 25 MG 24 hr tablet Take 1 tablet (25 mg total) by mouth 2 (two) times daily. 06/06/18  Yes RSkeet Latch MD  Multiple Vitamins-Minerals (CENTRUM SILVER PO) Take 1 tablet by mouth daily.    Yes [provider]  Omega-3 Fatty Acids (FISH OIL) 1000 MG CAPS Take 1,000 mg by mouth 2 (two) times a day.   Yes [provider]  omeprazole  (PRILOSEC) 20 MG capsule Take 20 mg by mouth daily.   Yes [provider]  oxyCODONE (OXY IR/ROXICODONE) 5 MG immediate release tablet Take 1 tablet (5 mg total) by mouth every 4 (four) hours as needed for moderate pain or severe pain. Patient taking differently: Take 5 mg by mouth 3 (three) times daily.  12/09/16  Yes Perkins, Alexzandrew L, PA-C  pioglitazone-metformin (ACTOPLUS MET) 15-500 MG per tablet Take 1 tablet by mouth daily.    Yes [provider]  Saw Palmetto 450 MG CAPS Take 450 mg by mouth daily.   Yes [provider]  Specialty Vitamins Products (BIOTIN PLUS KERATIN PO) Take 1 tablet by mouth at bedtime.   Yes [provider]  tamsulosin (FLOMAX) 0.4 MG CAPS  capsule Take 0.4 mg by mouth daily.    Yes [provider]  fenofibrate (TRICOR) 145 MG tablet Take 1 tablet (145 mg total) by mouth daily. 07/04/18   [name], MD   KNEE EXAM antalgic gait, soft tissue tenderness over anterior knee,no effusion, reduced range of motion (15-95), collateral ligaments intact  Physical Examination: General appearance - alert, well appearing, and in no distress Mental status - alert, oriented to person, place, and time Chest - clear to auscultation, no wheezes, rales or rhonchi, symmetric air entry Heart - normal rate, regular rhythm, normal S1, S2, no murmurs, rubs, clicks or gallops Abdomen - soft, nontender, nondistended, no masses or organomegaly Neurological - alert, oriented, normal speech, no focal findings or movement disorder noted   Asessment/Plan--- Left knee arthrofibrosis- - Plan left knee arthrotomy with scar excision Procedure risks and potential comps discussed with patient who elects to proceed. Goals are decreased pain and increased function with a high likelihood of achieving both

## 2018-07-24 NOTE — Anesthesia Procedure Notes (Signed)
Procedure Name: LMA Insertion Date/Time: 07/24/2018 4:04 PM Performed by: Glory Buff, CRNA Pre-anesthesia Checklist: Patient identified, Emergency Drugs available, Suction available and Patient being monitored Patient Re-evaluated:Patient Re-evaluated prior to induction Oxygen Delivery Method: Circle system utilized Preoxygenation: Pre-oxygenation with 100% oxygen Induction Type: IV induction LMA: LMA inserted LMA Size: 4.0 Number of attempts: 2 Placement Confirmation: positive ETCO2 Tube secured with: Tape Dental Injury: Teeth and Oropharynx as per pre-operative assessment

## 2018-07-25 ENCOUNTER — Encounter (HOSPITAL_COMMUNITY): Payer: Self-pay | Admitting: Orthopedic Surgery

## 2018-07-25 DIAGNOSIS — M24662 Ankylosis, left knee: Secondary | ICD-10-CM | POA: Diagnosis not present

## 2018-07-25 LAB — CBC
HCT: 37.8 % — ABNORMAL LOW (ref 39.0–52.0)
Hemoglobin: 12.3 g/dL — ABNORMAL LOW (ref 13.0–17.0)
MCH: 34.6 pg — ABNORMAL HIGH (ref 26.0–34.0)
MCHC: 32.5 g/dL (ref 30.0–36.0)
MCV: 106.2 fL — ABNORMAL HIGH (ref 80.0–100.0)
Platelets: 165 10*3/uL (ref 150–400)
RBC: 3.56 MIL/uL — ABNORMAL LOW (ref 4.22–5.81)
RDW: 13.2 % (ref 11.5–15.5)
WBC: 9.7 10*3/uL (ref 4.0–10.5)
nRBC: 0 % (ref 0.0–0.2)

## 2018-07-25 LAB — BASIC METABOLIC PANEL
Anion gap: 10 (ref 5–15)
BUN: 16 mg/dL (ref 8–23)
CO2: 22 mmol/L (ref 22–32)
Calcium: 8.2 mg/dL — ABNORMAL LOW (ref 8.9–10.3)
Chloride: 107 mmol/L (ref 98–111)
Creatinine, Ser: 0.85 mg/dL (ref 0.61–1.24)
GFR calc Af Amer: >60 mL/min (ref >60)
GFR calc non Af Amer: >60 mL/min (ref >60)
Glucose, Bld: 186 mg/dL — ABNORMAL HIGH (ref 70–99)
Potassium: 4.2 mmol/L (ref 3.5–5.1)
Sodium: 139 mmol/L (ref 135–145)

## 2018-07-25 LAB — GLUCOSE, CAPILLARY
Glucose-Capillary: 147 mg/dL — ABNORMAL HIGH (ref 70–99)
Glucose-Capillary: 138 mg/dL — ABNORMAL HIGH (ref 70–99)

## 2018-07-25 MED ORDER — METHOCARBAMOL 500 MG PO TABS: 500.0000 mg | ORAL_TABLET | Freq: Four times a day (QID) | ORAL | 0 refills | Status: DC | PRN

## 2018-07-25 MED ORDER — ASPIRIN 325 MG PO TBEC: 325.0000 mg | DELAYED_RELEASE_TABLET | Freq: Every day | ORAL | 0 refills | Status: AC

## 2018-07-25 NOTE — Evaluation (Signed)
Physical Therapy One Time Evaluation Patient Details Name: [name] MRN: [id number] DOB: 12-29-1940 Today's Date: 07/25/2018   History of Present Illness  Pt is a 78 year old male s/p Left knee arthrotomy with scar excision with hx of multiple left knee surgeries.  Clinical Impression  Patient evaluated by Physical Therapy with no further acute PT needs identified. All education has been completed and the patient has no further questions.  Pt ambulated in hallway and performed LE exercises.  Pt familiar with PT due to multiple L knee surgeries.  Pt provided with HEP handout and plans to f/u with OPPT.  See below for any follow-up Physical Therapy or equipment needs. PT is signing off. Thank you for this referral.     Follow Up Recommendations Follow surgeon's recommendation for DC plan and follow-up therapies;Outpatient PT    Equipment Recommendations  None recommended by PT    Recommendations for Other Services       Precautions / Restrictions Precautions Precautions: Knee;Fall Restrictions Other Position/Activity Restrictions: WBAT      Mobility  Bed Mobility Overal bed mobility: Needs Assistance Bed Mobility: Supine to Sit     Supine to sit: Supervision;HOB elevated        Transfers Overall transfer level: Needs assistance Equipment used: Rolling walker (2 wheeled) Transfers: Sit to/from Stand Sit to Stand: Min guard         General transfer comment: min/guard for safety  Ambulation/Gait Ambulation/Gait assistance: Min guard Gait Distance (Feet): 120 Feet Assistive device: Rolling walker (2 wheeled) Gait Pattern/deviations: Step-to pattern;Step-through pattern;Decreased stride length;Antalgic     General Gait Details: verbal cues for initial sequence, RW positioning, posture  Stairs            Wheelchair Mobility    Modified Rankin (Stroke Patients Only)       Balance                                              Pertinent Vitals/Pain Pain Assessment: 0-10 Pain Score: 5  Pain Location: left knee Pain Descriptors / Indicators: Aching;Sore Pain Intervention(s): Repositioned;Monitored during session;Limited activity within patient's tolerance;Ice applied    Home Living Family/patient expects to be discharged to:: Private residence Living Arrangements: Spouse/significant other Available Help at Discharge: Family;Available 24 hours/day Type of Home: House Home Access: Stairs to enter Entrance Stairs-Rails: None Entrance Stairs-Number of Steps: threshold Home Layout: One level Home Equipment: Environmental consultant - 2 wheels;Bedside commode;Wheelchair - manual;Cane - single point      Prior Function Level of Independence: Independent with assistive device(s)               Hand Dominance        Extremity/Trunk Assessment        Lower Extremity Assessment Lower Extremity Assessment: LLE deficits/detail LLE Deficits / Details: Left knee AAROM -10-40*, able to perform SLR       Communication   Communication: No difficulties  Cognition Arousal/Alertness: Awake/alert Behavior During Therapy: WFL for tasks assessed/performed Overall Cognitive Status: Within Functional Limits for tasks assessed                                        General Comments      Exercises Total Joint Exercises Ankle Circles/Pumps: AROM;Both;10 reps Quad Sets:  AROM;Both;10 reps Short Arc Quad: AROM;10 reps;Left Heel Slides: AAROM;Left;10 reps Hip ABduction/ADduction: AROM;10 reps;Left Straight Leg Raises: AROM;Left;10 reps   Assessment/Plan    PT Assessment All further PT needs can be met in the next venue of care  PT Problem List Decreased strength;Decreased range of motion;Pain;Decreased mobility       PT Treatment Interventions      PT Goals (Current goals can be found in the Care Plan section)  Acute Rehab PT Goals PT Goal Formulation: All assessment and education complete, DC  therapy    Frequency     Barriers to discharge        Co-evaluation               AM-PAC PT "6 Clicks" Mobility  Outcome Measure Help needed turning from your back to your side while in a flat bed without using bedrails?: None Help needed moving from lying on your back to sitting on the side of a flat bed without using bedrails?: A Little Help needed moving to and from a bed to a chair (including a wheelchair)?: A Little Help needed standing up from a chair using your arms (e.g., wheelchair or bedside chair)?: A Little Help needed to walk in hospital room?: A Little Help needed climbing 3-5 steps with a railing? : A Little 6 Click Score: 19    End of Session Equipment Utilized During Treatment: Gait belt Activity Tolerance: Patient tolerated treatment well Patient left: in chair;with call bell/phone within reach Nurse Communication: Mobility status PT Visit Diagnosis: Other abnormalities of gait and mobility (R26.89)    Time: 1809-7044 PT Time Calculation (min) (ACUTE ONLY): 19 min   Charges:   PT Evaluation $PT Eval Low Complexity: [place], PT, DPT Acute Rehabilitation Services Office: [phone] Pager: [phone]  Trena Platt 07/25/2018, 12:52 PM

## 2018-07-25 NOTE — Progress Notes (Signed)
Subjective: 1 Day Post-Op Procedure(s) (LRB): Left knee arthrotomy with scar excision (Left) Patient reports pain as mild.   Patient seen in rounds by Dr. Lequita HaltAluisio. Patient is well, and has had no acute complaints or problems. States he is ready to go home. Denies chest pain, SOB, or calf pain. We will begin therapy today.   Objective: Vital signs in last 24 hours: Temp:  [97.3 F (36.3 C)-98 F (36.7 C)] 97.3 F (36.3 C) (06/16 0530) Pulse Rate:  [53-77] 59 (06/16 0530) Resp:  [10-19] 19 (06/16 0530) BP: (104-137)/(52-98) 109/68 (06/16 0530) SpO2:  [94 %-100 %] 98 % (06/16 0530) Weight:  [99.1 kg] 99.1 kg (06/15 1329)  Intake/Output from previous day:  Intake/Output Summary (Last 24 hours) at 07/25/2018 0726 Last data filed at 07/25/2018 0435 Gross per 24 hour  Intake 2356.15 ml  Output 520 ml  Net 1836.15 ml    Labs: Recent Labs    07/25/18 0316  HGB 12.3*   Recent Labs    07/25/18 0316  WBC 9.7  RBC 3.56*  HCT 37.8*  PLT 165   Recent Labs    07/24/18 1349 07/25/18 0316  NA 141 139  K 4.1 4.2  CL 111 107  CO2 23 22  BUN 18 16  CREATININE 0.93 0.85  GLUCOSE 141* 186*  CALCIUM 8.6* 8.2*   Exam: General - Patient is Alert and Oriented Extremity - Neurologically intact Neurovascular intact Sensation intact distally Dorsiflexion/Plantar flexion intact Dressing - dressing C/D/I Motor Function - intact, moving foot and toes well on exam.   Past Medical History:  Diagnosis Date  . 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 Barrett's esophagus with dysplasia   . Chronic lower back pain    DDD  . Closed patellar sleeve fracture of left knee   . Constipation   . Crush injury knee    bilateral  . Dysphagia    EGD with dilitation  . Dysrhythmia    thought pt. had a fib after surgery saw cardiology later and ok meds preventive per pt.  . Enlarged prostate    sees a urologist  . GERD (gastroesophageal reflux  disease)    takes Omeprazole daily  . Hard of hearing   . Hyperlipidemia    takes Lipitor and Fish Oil daily  . Insomnia    d/t pain  . Joint pain   . Joint swelling   . Left tibial fracture   . Multiple duodenal ulcers   . Nocturia   . OSA on CPAP    cpap  . Pneumonia    "about 5 years ago"  . Type II diabetes mellitus ([facility])    type 2  . Unsteady gait   . Urinary frequency   . Wears glasses     Assessment/Plan: 1 Day Post-Op Procedure(s) (LRB): Left knee arthrotomy with scar excision (Left) Active Problems:   Arthrofibrosis of total knee arthroplasty ([facility])  Estimated body mass index is 34.22 kg/m as calculated from the following:   Height as of this encounter: 5\' 7"  (1.702 m).   Weight as of this encounter: 99.1 kg. Advance diet Up with therapy D/C IV fluids  DVT Prophylaxis - Aspirin Weight bearing as tolerated. D/C O2 and pulse ox and try on room air. Hemovac pulled without difficulty, will begin therapy.  Plan is to go Home after hospital stay. Plan for discharge to home today. Follow-up in the office in 2 weeks.  Theresa Duty, PA-C Orthopedic Surgery 07/25/2018, 7:26 AM

## 2018-08-30 ENCOUNTER — Telehealth: Payer: Self-pay

## 2018-08-30 NOTE — Telephone Encounter (Signed)
   [facility] Pre-operative Risk Assessment    Request for surgical clearance:  1. What type of surgery is being performed? L4-5 Lumbar Fusion/removal hardware   2. When is this surgery scheduled? TBD   3. What type of clearance is required (medical clearance vs. Pharmacy clearance to hold med vs. Both)? Medical  4. Are there any medications that need to be held prior to surgery and how long? Not specified   5. Practice name and name of physician performing surgery?  [facility]- Dr. Sherley Bounds   6. What is your office phone number [phone]    7.   What is your office fax number [phone] Attn: Lorriane Shire  8.   Anesthesia type (None, local, MAC, general) ? General   Meryl Crutch 08/30/2018, 4:15 PM  _________________________________________________________________   (provider comments below)

## 2018-08-31 ENCOUNTER — Other Ambulatory Visit: Payer: Self-pay | Admitting: Cardiology

## 2018-08-31 MED ORDER — AMIODARONE HCL 200 MG PO TABS: ORAL_TABLET | ORAL | 0 refills | Status: DC

## 2018-08-31 NOTE — Telephone Encounter (Signed)
LMTCB 08/31/2018 1230pm

## 2018-08-31 NOTE — Telephone Encounter (Signed)
I think that will be a reasonable strategy.  Can plan for 2 weeks of amiodarone preoperative and for 1 month afterward.  Patient should call the office when he discontinues amiodarone to ensure he is feeling well and no screening laboratory studies are needed.

## 2018-08-31 NOTE — Telephone Encounter (Signed)
Dr. Oval Linsey (Dr. Margaretann Loveless)  Can you please comment on the patients amiodarone?  He was last seen by Dr. Oval Linsey 05/2018.  At time, he had an upcoming knee surgery to be performed after COVID.  Due to his previous diagnosis of post-operative atrial fibrillation plan was for amiodarone 200 mg daily for 2 weeks prior to knee surgery then for 1 month postoperatively.  Patient was briefly started on Eliquis at diagnosis however or a 30-day monitor with no atrial fibrillation detected, therefore Eliquis was discontinued.  His knee surgery was 07/24/2018 therefore per orders, he could discontinue amiodarone 200 mg 08/23/2018.  I am attempting to contact the patient to discuss this.  In the meantime, plan is for L4-5 lumbar fusion/removal of hardware per Dr. Ronnald Ramp with [facility] neurosurgery and [facility].  My question is will he need to restart amiodarone 200 mg daily 2 weeks prior to this procedure as well and continue for 1 month after?  Please send your response to the preoperative pool  Thank you Sharee Pimple

## 2018-08-31 NOTE — Telephone Encounter (Signed)
   Primary Cardiologist: [name], MD  Chart reviewed as part of pre-operative protocol coverage. Patient was contacted 08/31/2018 in reference to pre-operative risk assessment for pending surgery as outlined below.  DEBBIE BELLUCCI was last seen on 06/06/2018 by Dr. Oval Linsey.  Since that day, [name] has done well from a cardiac perspective.  He has since undergone knee surgery on 07/24/2018 and was placed on temporary amiodarone 200 mg daily for 2 weeks prior to procedure and 1 month postoperatively without atrial fibrillation complication.     Per Dr. Margaretann Loveless, plan will be similar as above for L4-L5 lumbar fusion/removal of hardware per Dr. Ronnald Ramp.  Once procedure date is formalized, please inform patient in enough time for repeat plan of amiodarone 200 mg daily for 2 weeks prior to procedure and 1 month in the postprocedural setting.  I will send medication order to preferred pharmacy on file.  Therefore, based on ACC/AHA guidelines, the patient would be at acceptable risk for the planned procedure without further cardiovascular testing.   I will route this recommendation to the requesting party via Epic fax function and remove from pre-op pool.  Please call with questions.  Kathyrn Drown, NP 08/31/2018, 1:52 PM

## 2018-09-12 ENCOUNTER — Other Ambulatory Visit: Payer: Self-pay | Admitting: Neurological Surgery

## 2018-10-09 NOTE — Pre-Procedure Instructions (Signed)
[place], [place]. [place]. [place] [address] Phone: [phone] Fax: [phone]    Your procedure is scheduled on Thurs., Sept. 10, 2020 from 7:30AM-10:57AM  Report to [facility] Entrance "A" at 5:30AM   Call this number if you have problems the morning of surgery:  [phone]   Remember:  Do not eat or drink after midnight on Sept. 9th    Take these medicines the morning of surgery with A SIP OF WATER: Amiodarone (PACERONE)  Atorvastatin (LIPITOR)  Fenofibrate (TRICOR)  Gabapentin (NEURONTIN) Levothyroxine (SYNTHROID) Metoprolol succinate (TOPROL-XL) Omeprazole (PRILOSEC) OxyCODONE (OXY IR/ROXICODONE) Tamsulosin (FLOMAX)  Follow your surgeon's instructions on when to stop Aspirin.  If no instructions were given by your surgeon then you will need to call the office to get those instructions.    7 days before surgery (10/12/18), stop taking all Aspirin (unless instructed by your doctor) and Other Aspirin containing products, Vitamins, Fish oils, and Herbal medications. Also stop all NSAIDS i.e. Advil, Ibuprofen, Motrin, Aleve, Anaprox, Naproxen, BC, Goody Powders, and all Supplements.    . Do not take Glimepiride (AMARYL) and Pioglitazone-metformin (ACTOPLUS MET) the morning of surgery.   How to Manage Your Diabetes Before and After Surgery  Why is it important to control my blood sugar before and after surgery? . Improving blood sugar levels before and after surgery helps healing and can limit problems. . A way of improving blood sugar control is eating a healthy diet by: o  Eating less sugar and carbohydrates o  Increasing activity/exercise o  Talking with your doctor about reaching your blood sugar goals . High blood sugars (greater than 180 mg/dL) can raise your risk of infections and slow your recovery, so you will need to focus on controlling your diabetes during the weeks before  surgery. . Make sure that the doctor who takes care of your diabetes knows about your planned surgery including the date and location.  How do I manage my blood sugar before surgery? . Check your blood sugar at least 4 times a day, starting 2 days before surgery, to make sure that the level is not too high or low. o Check your blood sugar the morning of your surgery when you wake up and every 2 hours until you get to the Short Stay unit. . If your blood sugar is less than 70 mg/dL, you will need to treat for low blood sugar: o Do not take insulin. o Treat a low blood sugar (less than 70 mg/dL) with  cup of clear juice (cranberry or apple), 4 glucose tablets, OR glucose gel. Recheck blood sugar in 15 minutes after treatment (to make sure it is greater than 70 mg/dL). If your blood sugar is not greater than 70 mg/dL on recheck, call [phone] o  for further instructions.                                   . If your CBG is greater than 220 mg/dL, call the number above for further instructions.  . If you are admitted to the hospital after surgery: o Your blood sugar will be checked by the staff and you will probably be given insulin after surgery (instead of oral diabetes medicines) to make sure you have good blood sugar levels. o The goal for blood sugar control after surgery is 80-180 mg/dL. o  Reviewed and  Endorsed by [facility] Patient Education Committee, August 2015   Special instructions:  [facility]- Preparing For Surgery  Before surgery, you can play an important role. Because skin is not sterile, your skin needs to be as free of germs as possible. You can reduce the number of germs on your skin by washing with CHG (chlorahexidine gluconate) Soap before surgery.  CHG is an antiseptic cleaner which kills germs and bonds with the skin to continue killing germs even after washing.    Please do not use if you have an allergy to CHG or antibacterial soaps. If your skin becomes  reddened/irritated stop using the CHG.  Do not shave (including legs and underarms) for at least 48 hours prior to first CHG shower. It is OK to shave your face.  Please follow these instructions carefully.   1. Shower the NIGHT BEFORE SURGERY and the MORNING OF SURGERY with CHG.   2. If you chose to wash your hair, wash your hair first as usual with your normal shampoo.  3. After you shampoo, rinse your hair and body thoroughly to remove the shampoo.  4. Use CHG as you would any other liquid soap. You can apply CHG directly to the skin and wash gently with a scrungie or a clean washcloth.   5. Apply the CHG Soap to your body ONLY FROM THE NECK DOWN.  Do not use on open wounds or open sores. Avoid contact with your eyes, ears, mouth and genitals (private parts). Wash Face and genitals (private parts)  with your normal soap.  6. Wash thoroughly, paying special attention to the area where your surgery will be performed.  7. Thoroughly rinse your body with warm water from the neck down.  8. DO NOT shower/wash with your normal soap after using and rinsing off the CHG Soap.  9. Pat yourself dry with a CLEAN TOWEL.  10. Wear CLEAN PAJAMAS to bed the night before surgery, wear comfortable clothes the morning of surgery  11. Place CLEAN SHEETS on your bed the night of your first shower and DO NOT SLEEP WITH PETS.   Day of Surgery:             Remember to brush your teeth WITH YOUR REGULAR TOOTHPASTE.  Do not wear jewelry.  Do not wear lotions, powders, colognes, or deodorant.  Do not shave 48 hours prior to surgery.  Men may shave face and neck.  Do not bring valuables to the hospital.  [facility] is not responsible for any belongings or valuables.              Contacts, dentures or bridgework may not be worn into surgery.                For patients admitted to the hospital, discharge time will be determined by your treatment team.               Patients discharged the day of  surgery will not be allowed to drive home.   Please wear clean clothes to the hospital/surgery center.    Please read over the following fact sheets that you were given. Pain Booklet, Coughing and Deep Breathing, MRSA Information and Surgical Site Infection Prevention

## 2018-10-10 ENCOUNTER — Inpatient Hospital Stay (HOSPITAL_COMMUNITY)
Admission: RE | Admit: 2018-10-10 | Discharge: 2018-10-10 | Disposition: A | Discharge status: Home / Self Care | Payer: Medicare Other | Source: Ambulatory Visit

## 2018-10-12 ENCOUNTER — Encounter (HOSPITAL_COMMUNITY): Payer: Self-pay

## 2018-10-12 ENCOUNTER — Encounter (HOSPITAL_COMMUNITY)
Admission: RE | Admit: 2018-10-12 | Discharge: 2018-10-12 | Disposition: A | Discharge status: Home / Self Care | Payer: Medicare Other | Source: Ambulatory Visit | Attending: Neurological Surgery | Admitting: Neurological Surgery

## 2018-10-12 ENCOUNTER — Other Ambulatory Visit: Payer: Self-pay

## 2018-10-12 DIAGNOSIS — Z01818 Encounter for other preprocedural examination: Secondary | ICD-10-CM | POA: Diagnosis present

## 2018-10-12 DIAGNOSIS — M48061 Spinal stenosis, lumbar region without neurogenic claudication: Secondary | ICD-10-CM | POA: Insufficient documentation

## 2018-10-12 LAB — GLUCOSE, CAPILLARY: Glucose-Capillary: 117 mg/dL — ABNORMAL HIGH (ref 70–99)

## 2018-10-12 LAB — PROTIME-INR
INR: 1 (ref 0.8–1.2)
Prothrombin Time: 13.4 seconds (ref 11.4–15.2)

## 2018-10-12 LAB — SURGICAL PCR SCREEN
MRSA, PCR: NEGATIVE
Staphylococcus aureus: NEGATIVE

## 2018-10-12 NOTE — Progress Notes (Signed)
Blood bank reported that patient was positive for antibodies. Will need sample recollected on DOS.   Jacqlyn Larsen, RN

## 2018-10-12 NOTE — Progress Notes (Signed)
PCP - Dr. [name] Cardiologist - Dr. [name]  Chest x-ray - 10/12/18 EKG - 07/20/18 Stress Test -5+ years ago  ECHO - 07/02/16 Cardiac Cath - denies  Sleep Study - OSA+ CPAP - uses nightly; setting is 10. Instructed to bring mask, tubing and machine on DOS.  Fasting Blood Sugar - 90-110 Checks Blood Sugar every other week  Blood Thinner Instructions:N/A Aspirin Instructions:Hold 7 days prior to surgery. LD 10/12/18  Anesthesia review: No  Patient denies shortness of breath, fever, cough and chest pain at PAT appointment   Patient verbalized understanding of instructions that were given to them at the PAT appointment. Patient was also instructed that they will need to review over the PAT instructions again at home before surgery.   Coronavirus Screening  Have you experienced the following symptoms:  Cough yes/no: No Fever (>100.77F)  yes/no: No Runny nose yes/no: No Sore throat yes/no: No Difficulty breathing/shortness of breath  yes/no: No  Have you or a family member traveled in the last 14 days and where? yes/no: No   If the patient indicates "YES" to the above questions, their PAT will be rescheduled to limit the exposure to others and, the surgeon will be notified. THE PATIENT WILL NEED TO BE ASYMPTOMATIC FOR 14 DAYS.   If the patient is not experiencing any of these symptoms, the PAT nurse will instruct them to NOT bring anyone with them to their appointment since they may have these symptoms or traveled as well.   Please remind your patients and families that hospital visitation restrictions are in effect and the importance of the restrictions.

## 2018-10-17 ENCOUNTER — Other Ambulatory Visit (HOSPITAL_COMMUNITY): Payer: Medicare Other

## 2018-10-17 ENCOUNTER — Other Ambulatory Visit (HOSPITAL_COMMUNITY)
Admission: RE | Admit: 2018-10-17 | Discharge: 2018-10-17 | Disposition: A | Discharge status: Home / Self Care | Payer: Medicare Other | Source: Ambulatory Visit | Attending: Neurological Surgery | Admitting: Neurological Surgery

## 2018-10-18 LAB — SARS CORONAVIRUS 2 (TAT 6-24 HRS): SARS Coronavirus 2: NEGATIVE

## 2018-10-19 ENCOUNTER — Inpatient Hospital Stay (HOSPITAL_COMMUNITY): Payer: Medicare Other

## 2018-10-19 ENCOUNTER — Encounter (HOSPITAL_COMMUNITY): Payer: Self-pay | Admitting: *Deleted

## 2018-10-19 ENCOUNTER — Other Ambulatory Visit: Payer: Self-pay

## 2018-10-19 ENCOUNTER — Encounter (HOSPITAL_COMMUNITY)
Admission: RE | Disposition: A | Discharge status: Home / Self Care | Payer: Self-pay | Source: Home / Self Care | Attending: Neurological Surgery

## 2018-10-19 ENCOUNTER — Inpatient Hospital Stay (HOSPITAL_COMMUNITY): Payer: Medicare Other | Admitting: Anesthesiology

## 2018-10-19 ENCOUNTER — Inpatient Hospital Stay (HOSPITAL_COMMUNITY)
Admission: RE | Admit: 2018-10-19 | Discharge: 2018-10-20 | DRG: 455 | Disposition: A | Discharge status: Home / Self Care | Payer: Medicare Other | Attending: Neurological Surgery | Admitting: Neurological Surgery

## 2018-10-19 DIAGNOSIS — Z833 Family history of diabetes mellitus: Secondary | ICD-10-CM | POA: Diagnosis not present

## 2018-10-19 DIAGNOSIS — Z20828 Contact with and (suspected) exposure to other viral communicable diseases: Secondary | ICD-10-CM | POA: Diagnosis present

## 2018-10-19 DIAGNOSIS — K219 Gastro-esophageal reflux disease without esophagitis: Secondary | ICD-10-CM | POA: Diagnosis present

## 2018-10-19 DIAGNOSIS — Z79890 Hormone replacement therapy (postmenopausal): Secondary | ICD-10-CM

## 2018-10-19 DIAGNOSIS — Z7982 Long term (current) use of aspirin: Secondary | ICD-10-CM

## 2018-10-19 DIAGNOSIS — Z79899 Other long term (current) drug therapy: Secondary | ICD-10-CM

## 2018-10-19 DIAGNOSIS — E119 Type 2 diabetes mellitus without complications: Secondary | ICD-10-CM | POA: Diagnosis present

## 2018-10-19 DIAGNOSIS — Z419 Encounter for procedure for purposes other than remedying health state, unspecified: Secondary | ICD-10-CM

## 2018-10-19 DIAGNOSIS — N400 Enlarged prostate without lower urinary tract symptoms: Secondary | ICD-10-CM | POA: Diagnosis present

## 2018-10-19 DIAGNOSIS — Z96653 Presence of artificial knee joint, bilateral: Secondary | ICD-10-CM | POA: Diagnosis present

## 2018-10-19 DIAGNOSIS — E785 Hyperlipidemia, unspecified: Secondary | ICD-10-CM | POA: Diagnosis present

## 2018-10-19 DIAGNOSIS — Z981 Arthrodesis status: Secondary | ICD-10-CM

## 2018-10-19 DIAGNOSIS — Z7984 Long term (current) use of oral hypoglycemic drugs: Secondary | ICD-10-CM

## 2018-10-19 DIAGNOSIS — Z87891 Personal history of nicotine dependence: Secondary | ICD-10-CM | POA: Diagnosis not present

## 2018-10-19 DIAGNOSIS — H9190 Unspecified hearing loss, unspecified ear: Secondary | ICD-10-CM | POA: Diagnosis present

## 2018-10-19 DIAGNOSIS — M48061 Spinal stenosis, lumbar region without neurogenic claudication: Principal | ICD-10-CM | POA: Diagnosis present

## 2018-10-19 DIAGNOSIS — G4733 Obstructive sleep apnea (adult) (pediatric): Secondary | ICD-10-CM | POA: Diagnosis present

## 2018-10-19 HISTORY — DX: Dyspnea, unspecified: R06.00

## 2018-10-19 LAB — GLUCOSE, CAPILLARY
Glucose-Capillary: 166 mg/dL — ABNORMAL HIGH (ref 70–99)
Glucose-Capillary: 161 mg/dL — ABNORMAL HIGH (ref 70–99)
Glucose-Capillary: 287 mg/dL — ABNORMAL HIGH (ref 70–99)
Glucose-Capillary: 202 mg/dL — ABNORMAL HIGH (ref 70–99)

## 2018-10-19 LAB — TYPE AND SCREEN
ABO/RH(D): A POS
Antibody Screen: POSITIVE

## 2018-10-19 SURGERY — POSTERIOR LUMBAR FUSION 1 WITH HARDWARE REMOVAL
Anesthesia: General | Site: Spine Lumbar

## 2018-10-19 MED ORDER — LIDOCAINE 2% (20 MG/ML) 5 ML SYRINGE
INTRAMUSCULAR | Status: DC | PRN
  Administered 2018-10-19: 60 mg via INTRAVENOUS

## 2018-10-19 MED ORDER — SODIUM CHLORIDE 0.9 % IV SOLN
INTRAVENOUS | Status: DC | PRN
  Administered 2018-10-19: 25 ug/min via INTRAVENOUS

## 2018-10-19 MED ORDER — METHOCARBAMOL 500 MG PO TABS
500.0000 mg | ORAL_TABLET | Freq: Four times a day (QID) | ORAL | Status: DC | PRN
  Administered 2018-10-19 – 2018-10-20 (×4): 500 mg via ORAL
  Filled 2018-10-19 (×4): qty 1

## 2018-10-19 MED ORDER — CEFAZOLIN SODIUM-DEXTROSE 2-4 GM/100ML-% IV SOLN
2.0000 g | INTRAVENOUS | Status: AC
  Administered 2018-10-19: 2 g via INTRAVENOUS
  Filled 2018-10-19: qty 100

## 2018-10-19 MED ORDER — FENOFIBRATE 54 MG PO TABS
54.0000 mg | ORAL_TABLET | Freq: Every day | ORAL | Status: DC
  Filled 2018-10-19: qty 1

## 2018-10-19 MED ORDER — PROPOFOL 10 MG/ML IV BOLUS
INTRAVENOUS | Status: AC
  Filled 2018-10-19: qty 20

## 2018-10-19 MED ORDER — CELECOXIB 200 MG PO CAPS
200.0000 mg | ORAL_CAPSULE | Freq: Two times a day (BID) | ORAL | Status: DC
  Administered 2018-10-19 (×2): 200 mg via ORAL
  Filled 2018-10-19 (×2): qty 1

## 2018-10-19 MED ORDER — METFORMIN HCL 500 MG PO TABS: 500.0000 mg | ORAL_TABLET | Freq: Every day | ORAL | Status: DC

## 2018-10-19 MED ORDER — METHOCARBAMOL 500 MG PO TABS
ORAL_TABLET | ORAL | Status: AC
  Filled 2018-10-19: qty 1

## 2018-10-19 MED ORDER — KETOROLAC TROMETHAMINE 30 MG/ML IJ SOLN
INTRAMUSCULAR | Status: AC
  Filled 2018-10-19: qty 1

## 2018-10-19 MED ORDER — ONDANSETRON HCL 4 MG/2ML IJ SOLN: 4.0000 mg | Freq: Four times a day (QID) | INTRAMUSCULAR | Status: DC | PRN

## 2018-10-19 MED ORDER — LEVOTHYROXINE SODIUM 25 MCG PO TABS: 50.0000 ug | ORAL_TABLET | ORAL | Status: DC

## 2018-10-19 MED ORDER — DEXAMETHASONE SODIUM PHOSPHATE 10 MG/ML IJ SOLN
INTRAMUSCULAR | Status: DC | PRN
  Administered 2018-10-19: 5 mg via INTRAVENOUS

## 2018-10-19 MED ORDER — METHOCARBAMOL 1000 MG/10ML IJ SOLN
500.0000 mg | Freq: Four times a day (QID) | INTRAVENOUS | Status: DC | PRN
  Filled 2018-10-19: qty 5

## 2018-10-19 MED ORDER — AMIODARONE HCL 200 MG PO TABS
200.0000 mg | ORAL_TABLET | Freq: Every day | ORAL | Status: DC
  Filled 2018-10-19: qty 1

## 2018-10-19 MED ORDER — GLIMEPIRIDE 2 MG PO TABS: 2.0000 mg | ORAL_TABLET | Freq: Every day | ORAL | Status: DC

## 2018-10-19 MED ORDER — EPHEDRINE 5 MG/ML INJ
INTRAVENOUS | Status: AC
  Filled 2018-10-19: qty 10

## 2018-10-19 MED ORDER — THROMBIN 20000 UNITS EX SOLR
CUTANEOUS | Status: AC
  Filled 2018-10-19: qty 20000

## 2018-10-19 MED ORDER — LIDOCAINE 2% (20 MG/ML) 5 ML SYRINGE
INTRAMUSCULAR | Status: AC
  Filled 2018-10-19: qty 5

## 2018-10-19 MED ORDER — DOXYCYCLINE HYCLATE 100 MG PO TABS
100.0000 mg | ORAL_TABLET | Freq: Every day | ORAL | Status: DC
  Administered 2018-10-19: 100 mg via ORAL
  Filled 2018-10-19 (×2): qty 1

## 2018-10-19 MED ORDER — MENTHOL 3 MG MT LOZG: 1.0000 | LOZENGE | OROMUCOSAL | Status: DC | PRN

## 2018-10-19 MED ORDER — HYDROMORPHONE HCL 1 MG/ML IJ SOLN
INTRAMUSCULAR | Status: AC
  Filled 2018-10-19: qty 0.5

## 2018-10-19 MED ORDER — ONDANSETRON HCL 4 MG PO TABS: 4.0000 mg | ORAL_TABLET | Freq: Four times a day (QID) | ORAL | Status: DC | PRN

## 2018-10-19 MED ORDER — PROPOFOL 10 MG/ML IV BOLUS
INTRAVENOUS | Status: DC | PRN
  Administered 2018-10-19: 120 mg via INTRAVENOUS

## 2018-10-19 MED ORDER — SENNA 8.6 MG PO TABS
1.0000 | ORAL_TABLET | Freq: Two times a day (BID) | ORAL | Status: DC
  Administered 2018-10-19: 8.6 mg via ORAL
  Filled 2018-10-19: qty 1

## 2018-10-19 MED ORDER — SODIUM CHLORIDE 0.9% FLUSH
3.0000 mL | Freq: Two times a day (BID) | INTRAVENOUS | Status: DC
  Administered 2018-10-19: 3 mL via INTRAVENOUS

## 2018-10-19 MED ORDER — EPHEDRINE SULFATE-NACL 50-0.9 MG/10ML-% IV SOSY
PREFILLED_SYRINGE | INTRAVENOUS | Status: DC | PRN
  Administered 2018-10-19 (×3): 10 mg via INTRAVENOUS
  Administered 2018-10-19 (×2): 5 mg via INTRAVENOUS

## 2018-10-19 MED ORDER — CEFAZOLIN SODIUM-DEXTROSE 2-4 GM/100ML-% IV SOLN
2.0000 g | Freq: Three times a day (TID) | INTRAVENOUS | Status: AC
  Administered 2018-10-19 (×2): 2 g via INTRAVENOUS
  Filled 2018-10-19 (×2): qty 100

## 2018-10-19 MED ORDER — TAMSULOSIN HCL 0.4 MG PO CAPS: 0.4000 mg | ORAL_CAPSULE | Freq: Every day | ORAL | Status: DC

## 2018-10-19 MED ORDER — ACETAMINOPHEN 500 MG PO TABS
1000.0000 mg | ORAL_TABLET | Freq: Once | ORAL | Status: AC
  Administered 2018-10-19: 1000 mg via ORAL
  Filled 2018-10-19: qty 2

## 2018-10-19 MED ORDER — KETOROLAC TROMETHAMINE 30 MG/ML IJ SOLN
15.0000 mg | Freq: Once | INTRAMUSCULAR | Status: AC | PRN
  Administered 2018-10-19: 15 mg via INTRAVENOUS

## 2018-10-19 MED ORDER — SODIUM CHLORIDE 0.9 % IV SOLN: 250.0000 mL | INTRAVENOUS | Status: DC

## 2018-10-19 MED ORDER — FINASTERIDE 5 MG PO TABS
5.0000 mg | ORAL_TABLET | Freq: Every evening | ORAL | Status: DC
  Administered 2018-10-19: 5 mg via ORAL
  Filled 2018-10-19: qty 1

## 2018-10-19 MED ORDER — SUGAMMADEX SODIUM 200 MG/2ML IV SOLN
INTRAVENOUS | Status: DC | PRN
  Administered 2018-10-19: 202.6 mg via INTRAVENOUS

## 2018-10-19 MED ORDER — THROMBIN 20000 UNITS EX SOLR
CUTANEOUS | Status: DC | PRN
  Administered 2018-10-19: 09:00:00 20 mL via TOPICAL

## 2018-10-19 MED ORDER — METOPROLOL SUCCINATE ER 25 MG PO TB24
25.0000 mg | ORAL_TABLET | Freq: Two times a day (BID) | ORAL | Status: DC
  Administered 2018-10-19: 25 mg via ORAL
  Filled 2018-10-19: qty 1

## 2018-10-19 MED ORDER — FENTANYL CITRATE (PF) 250 MCG/5ML IJ SOLN
INTRAMUSCULAR | Status: AC
  Filled 2018-10-19: qty 5

## 2018-10-19 MED ORDER — PHENOL 1.4 % MT LIQD: 1.0000 | OROMUCOSAL | Status: DC | PRN

## 2018-10-19 MED ORDER — ASPIRIN EC 81 MG PO TBEC: 81.0000 mg | DELAYED_RELEASE_TABLET | Freq: Every day | ORAL | Status: DC

## 2018-10-19 MED ORDER — THROMBIN 5000 UNITS EX SOLR
OROMUCOSAL | Status: DC | PRN
  Administered 2018-10-19: 5 mL via TOPICAL

## 2018-10-19 MED ORDER — GABAPENTIN 300 MG PO CAPS
300.0000 mg | ORAL_CAPSULE | Freq: Three times a day (TID) | ORAL | Status: DC
  Administered 2018-10-19 (×2): 300 mg via ORAL
  Filled 2018-10-19 (×2): qty 1

## 2018-10-19 MED ORDER — POTASSIUM CHLORIDE IN NACL 20-0.9 MEQ/L-% IV SOLN: INTRAVENOUS | Status: DC

## 2018-10-19 MED ORDER — HYDROMORPHONE HCL 1 MG/ML IJ SOLN
0.2500 mg | INTRAMUSCULAR | Status: DC | PRN
  Administered 2018-10-19 (×2): 0.25 mg via INTRAVENOUS
  Administered 2018-10-19: 0.5 mg via INTRAVENOUS

## 2018-10-19 MED ORDER — BUPIVACAINE HCL (PF) 0.25 % IJ SOLN
INTRAMUSCULAR | Status: DC | PRN
  Administered 2018-10-19: 6 mL

## 2018-10-19 MED ORDER — CHLORHEXIDINE GLUCONATE CLOTH 2 % EX PADS: 6.0000 | MEDICATED_PAD | Freq: Once | CUTANEOUS | Status: DC

## 2018-10-19 MED ORDER — ACETAMINOPHEN 325 MG PO TABS
650.0000 mg | ORAL_TABLET | ORAL | Status: DC | PRN
  Administered 2018-10-19 – 2018-10-20 (×2): 650 mg via ORAL
  Filled 2018-10-19 (×2): qty 2

## 2018-10-19 MED ORDER — ROCURONIUM BROMIDE 10 MG/ML (PF) SYRINGE
PREFILLED_SYRINGE | INTRAVENOUS | Status: DC | PRN
  Administered 2018-10-19: 60 mg via INTRAVENOUS

## 2018-10-19 MED ORDER — DEXAMETHASONE SODIUM PHOSPHATE 10 MG/ML IJ SOLN
INTRAMUSCULAR | Status: AC
  Filled 2018-10-19: qty 1

## 2018-10-19 MED ORDER — ONDANSETRON HCL 4 MG/2ML IJ SOLN
INTRAMUSCULAR | Status: AC
  Filled 2018-10-19: qty 2

## 2018-10-19 MED ORDER — ONDANSETRON HCL 4 MG/2ML IJ SOLN
INTRAMUSCULAR | Status: DC | PRN
  Administered 2018-10-19: 4 mg via INTRAVENOUS

## 2018-10-19 MED ORDER — ARTHREX ANGEL - ACD-A SOLUTION (CHARTING ONLY) OPTIME
TOPICAL | Status: DC | PRN
  Administered 2018-10-19: 8 mL via TOPICAL

## 2018-10-19 MED ORDER — HYDROMORPHONE HCL 1 MG/ML IJ SOLN
INTRAMUSCULAR | Status: DC | PRN
  Administered 2018-10-19: 0.5 mg via INTRAVENOUS

## 2018-10-19 MED ORDER — GABAPENTIN 100 MG PO CAPS
100.0000 mg | ORAL_CAPSULE | Freq: Once | ORAL | Status: DC
  Filled 2018-10-19: qty 1

## 2018-10-19 MED ORDER — ACETAMINOPHEN 650 MG RE SUPP: 650.0000 mg | RECTAL | Status: DC | PRN

## 2018-10-19 MED ORDER — LACTATED RINGERS IV SOLN
INTRAVENOUS | Status: DC | PRN
  Administered 2018-10-19: 08:00:00 via INTRAVENOUS

## 2018-10-19 MED ORDER — HYDROMORPHONE HCL 1 MG/ML IJ SOLN
INTRAMUSCULAR | Status: AC
  Filled 2018-10-19: qty 1

## 2018-10-19 MED ORDER — DOCUSATE SODIUM 100 MG PO CAPS
200.0000 mg | ORAL_CAPSULE | Freq: Every day | ORAL | Status: DC
  Administered 2018-10-19: 200 mg via ORAL
  Filled 2018-10-19: qty 2

## 2018-10-19 MED ORDER — BUPIVACAINE HCL (PF) 0.25 % IJ SOLN
INTRAMUSCULAR | Status: AC
  Filled 2018-10-19: qty 30

## 2018-10-19 MED ORDER — HEPARIN SODIUM (PORCINE) 1000 UNIT/ML IJ SOLN
INTRAMUSCULAR | Status: AC
  Filled 2018-10-19: qty 1

## 2018-10-19 MED ORDER — INSULIN ASPART 100 UNIT/ML ~~LOC~~ SOLN
0.0000 [IU] | Freq: Three times a day (TID) | SUBCUTANEOUS | Status: DC
  Administered 2018-10-19: 8 [IU] via SUBCUTANEOUS

## 2018-10-19 MED ORDER — OXYCODONE HCL 5 MG PO TABS: 5.0000 mg | ORAL_TABLET | Freq: Once | ORAL | Status: DC | PRN

## 2018-10-19 MED ORDER — VANCOMYCIN HCL 1000 MG IV SOLR
INTRAVENOUS | Status: AC
  Filled 2018-10-19: qty 1000

## 2018-10-19 MED ORDER — ROCURONIUM BROMIDE 10 MG/ML (PF) SYRINGE
PREFILLED_SYRINGE | INTRAVENOUS | Status: AC
  Filled 2018-10-19: qty 10

## 2018-10-19 MED ORDER — LACTATED RINGERS IV SOLN
INTRAVENOUS | Status: DC | PRN
  Administered 2018-10-19: 07:00:00 via INTRAVENOUS

## 2018-10-19 MED ORDER — PROMETHAZINE HCL 25 MG/ML IJ SOLN: 6.2500 mg | INTRAMUSCULAR | Status: DC | PRN

## 2018-10-19 MED ORDER — 0.9 % SODIUM CHLORIDE (POUR BTL) OPTIME
TOPICAL | Status: DC | PRN
  Administered 2018-10-19: 1000 mL

## 2018-10-19 MED ORDER — PIOGLITAZONE HCL 15 MG PO TABS
15.0000 mg | ORAL_TABLET | Freq: Every day | ORAL | Status: DC
  Filled 2018-10-19: qty 1

## 2018-10-19 MED ORDER — FENTANYL CITRATE (PF) 250 MCG/5ML IJ SOLN
INTRAMUSCULAR | Status: DC | PRN
  Administered 2018-10-19 (×2): 50 ug via INTRAVENOUS

## 2018-10-19 MED ORDER — OXYCODONE HCL 5 MG PO TABS
5.0000 mg | ORAL_TABLET | ORAL | Status: DC | PRN
  Administered 2018-10-19 – 2018-10-20 (×6): 5 mg via ORAL
  Filled 2018-10-19 (×6): qty 1

## 2018-10-19 MED ORDER — DEXAMETHASONE SODIUM PHOSPHATE 10 MG/ML IJ SOLN
10.0000 mg | Freq: Once | INTRAMUSCULAR | Status: DC
  Filled 2018-10-19: qty 1

## 2018-10-19 MED ORDER — PIOGLITAZONE HCL-METFORMIN HCL 15-500 MG PO TABS: 1.0000 | ORAL_TABLET | Freq: Every day | ORAL | Status: DC

## 2018-10-19 MED ORDER — MORPHINE SULFATE (PF) 2 MG/ML IV SOLN: 2.0000 mg | INTRAVENOUS | Status: DC | PRN

## 2018-10-19 MED ORDER — THROMBIN 5000 UNITS EX SOLR
CUTANEOUS | Status: AC
  Filled 2018-10-19: qty 5000

## 2018-10-19 MED ORDER — LEVOTHYROXINE SODIUM 100 MCG PO TABS
100.0000 ug | ORAL_TABLET | ORAL | Status: DC
  Administered 2018-10-20: 100 ug via ORAL
  Filled 2018-10-19: qty 1

## 2018-10-19 MED ORDER — SODIUM CHLORIDE 0.9 % IV SOLN
INTRAVENOUS | Status: DC | PRN
  Administered 2018-10-19: 500 mL

## 2018-10-19 MED ORDER — SODIUM CHLORIDE 0.9% FLUSH: 3.0000 mL | INTRAVENOUS | Status: DC | PRN

## 2018-10-19 MED ORDER — SODIUM CHLORIDE 0.9 % IV SOLN
INTRAVENOUS | Status: DC | PRN
  Administered 2018-10-19: 5 mL

## 2018-10-19 MED ORDER — OXYCODONE HCL 5 MG/5ML PO SOLN: 5.0000 mg | Freq: Once | ORAL | Status: DC | PRN

## 2018-10-19 SURGICAL SUPPLY — 62 items
BAG DECANTER FOR FLEXI CONT (MISCELLANEOUS) ×2 IMPLANT
BASKET BONE COLLECTION (BASKET) ×2 IMPLANT
BENZOIN TINCTURE PRP APPL 2/3 (GAUZE/BANDAGES/DRESSINGS) ×2 IMPLANT
BIT DRILL PLIF MAS DISP 5.5MM (DRILL) ×1 IMPLANT
BUR MATCHSTICK NEURO 3.0 LAGG (BURR) ×2 IMPLANT
CAGE COROENT PLIF 10X28-8 LUMB (Cage) ×4 IMPLANT
CANISTER SUCT 3000ML PPV (MISCELLANEOUS) ×2 IMPLANT
CARTRIDGE OIL MAESTRO DRILL (MISCELLANEOUS) ×1 IMPLANT
CONT SPEC 4OZ CLIKSEAL STRL BL (MISCELLANEOUS) ×2 IMPLANT
COVER BACK TABLE 60X90IN (DRAPES) ×2 IMPLANT
DERMABOND ADVANCED (GAUZE/BANDAGES/DRESSINGS) ×1
DERMABOND ADVANCED .7 DNX12 (GAUZE/BANDAGES/DRESSINGS) ×1 IMPLANT
DIFFUSER DRILL AIR PNEUMATIC (MISCELLANEOUS) ×2 IMPLANT
DRAPE C-ARM 42X72 X-RAY (DRAPES) ×2 IMPLANT
DRAPE C-ARMOR (DRAPES) ×2 IMPLANT
DRAPE LAPAROTOMY 100X72X124 (DRAPES) ×2 IMPLANT
DRAPE SURG 17X23 STRL (DRAPES) ×2 IMPLANT
DRILL PLIF MAS DISP 5.5MM (DRILL) ×2
DRSG OPSITE POSTOP 4X6 (GAUZE/BANDAGES/DRESSINGS) ×2 IMPLANT
DURAPREP 26ML APPLICATOR (WOUND CARE) ×2 IMPLANT
ELECT REM PT RETURN 9FT ADLT (ELECTROSURGICAL) ×2
ELECTRODE REM PT RTRN 9FT ADLT (ELECTROSURGICAL) ×1 IMPLANT
EVACUATOR 1/8 PVC DRAIN (DRAIN) ×2 IMPLANT
GAUZE 4X4 16PLY RFD (DISPOSABLE) IMPLANT
GLOVE BIO SURGEON STRL SZ7 (GLOVE) ×8 IMPLANT
GLOVE BIO SURGEON STRL SZ8 (GLOVE) ×4 IMPLANT
GLOVE BIOGEL PI IND STRL 7.0 (GLOVE) ×4 IMPLANT
GLOVE BIOGEL PI IND STRL 7.5 (GLOVE) ×2 IMPLANT
GLOVE BIOGEL PI INDICATOR 7.0 (GLOVE) ×4
GLOVE BIOGEL PI INDICATOR 7.5 (GLOVE) ×2
GOWN STRL REUS W/ TWL LRG LVL3 (GOWN DISPOSABLE) ×2 IMPLANT
GOWN STRL REUS W/ TWL XL LVL3 (GOWN DISPOSABLE) ×2 IMPLANT
GOWN STRL REUS W/TWL 2XL LVL3 (GOWN DISPOSABLE) IMPLANT
GOWN STRL REUS W/TWL LRG LVL3 (GOWN DISPOSABLE) ×2
GOWN STRL REUS W/TWL XL LVL3 (GOWN DISPOSABLE) ×2
HEMOSTAT POWDER KIT SURGIFOAM (HEMOSTASIS) ×2 IMPLANT
KIT BASIN OR (CUSTOM PROCEDURE TRAY) ×2 IMPLANT
KIT BONE MRW ASP ANGEL CPRP (KITS) ×2 IMPLANT
KIT TURNOVER KIT B (KITS) ×2 IMPLANT
NEEDLE HYPO 25X1 1.5 SAFETY (NEEDLE) ×2 IMPLANT
NS IRRIG 1000ML POUR BTL (IV SOLUTION) ×2 IMPLANT
OIL CARTRIDGE MAESTRO DRILL (MISCELLANEOUS) ×2
PACK LAMINECTOMY NEURO (CUSTOM PROCEDURE TRAY) ×2 IMPLANT
PUTTY DBM ALLOSYNC PURE 10CC (Putty) ×2 IMPLANT
ROD 55MM (Rod) ×2 IMPLANT
ROD SPNL 55XPREBNT NS MAS (Rod) ×2 IMPLANT
SCREW LOCK (Screw) ×6 IMPLANT
SCREW LOCK FXNS SPNE MAS PL (Screw) ×6 IMPLANT
SCREW SHANK 6.5X40 (Screw) ×2 IMPLANT
SCREW SHANK 6.5X40 NS LF (Screw) ×2 IMPLANT
SCREW TULIP 5.5 (Screw) ×4 IMPLANT
SPONGE SURGIFOAM ABS GEL 100 (HEMOSTASIS) ×2 IMPLANT
STRIP CLOSURE SKIN 1/2X4 (GAUZE/BANDAGES/DRESSINGS) ×2 IMPLANT
SUT VIC AB 0 CT1 18XCR BRD8 (SUTURE) ×1 IMPLANT
SUT VIC AB 0 CT1 8-18 (SUTURE) ×1
SUT VIC AB 2-0 CP2 18 (SUTURE) ×2 IMPLANT
SUT VIC AB 3-0 SH 8-18 (SUTURE) ×4 IMPLANT
SYR CONTROL 10ML LL (SYRINGE) ×2 IMPLANT
TOWEL GREEN STERILE (TOWEL DISPOSABLE) ×2 IMPLANT
TOWEL GREEN STERILE FF (TOWEL DISPOSABLE) ×2 IMPLANT
TRAY FOLEY MTR SLVR 16FR STAT (SET/KITS/TRAYS/PACK) ×2 IMPLANT
WATER STERILE IRR 1000ML POUR (IV SOLUTION) ×2 IMPLANT

## 2018-10-19 NOTE — Anesthesia Postprocedure Evaluation (Signed)
Anesthesia Post Note  Patient: [name]  Procedure(s) Performed: LUMBAR FOUR-FIVE POSTERIOR LUMBAR INTERBODY FUSION WITH EXTENSION OF FUSION (N/A Spine Lumbar)     Anesthesia Post Evaluation  Last Vitals:  Vitals:   10/19/18 1130 10/19/18 1145  BP: 123/68 (!) 110/97  Pulse: 75 79  Resp: 20 20  Temp:    SpO2: 92% 95%    Last Pain:  Vitals:   10/19/18 1141  TempSrc:   PainSc: [place]

## 2018-10-19 NOTE — H&P (Signed)
Subjective: Patient is a 78 y.o. male admitted for plif. Onset of symptoms was several months ago, gradually worsening since that time.  The pain is rated severe, and is located at the across the lower back and radiates to legs. The pain is described as aching and occurs all day. The symptoms have been progressive. Symptoms are exacerbated by exercise. 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 Barrett's esophagus with dysplasia   . Chronic lower back pain    DDD  . Closed patellar sleeve fracture of left knee   . Constipation   . Crush injury knee    bilateral  . Dysphagia    EGD with dilitation  . Dyspnea    walking long distance  . Dysrhythmia    thought pt. had a fib after surgery saw cardiology later and ok meds preventive per pt.  . Enlarged prostate    sees a urologist  . GERD (gastroesophageal reflux disease)    takes Omeprazole daily  . Hard of hearing   . Hyperlipidemia    takes Lipitor and Fish Oil daily  . Insomnia    d/t pain  . Joint pain   . Joint swelling   . Left tibial fracture   . Multiple duodenal ulcers   . Nocturia   . OSA on CPAP    cpap  . Pneumonia    "about 5 years ago"  . Type II diabetes mellitus ([place])    type 2  . Unsteady gait   . Urinary frequency   . Wears glasses     Past Surgical History:  Procedure Laterality Date  . ANTERIOR CERVICAL DECOMP/DISCECTOMY FUSION  10/2010  . ANTERIOR LAT LUMBAR FUSION  12/02/2011   Procedure: ANTERIOR LATERAL LUMBAR FUSION 1 LEVEL;  Surgeon: Eustace Moore, MD;  Location: [facility];  Service: Neurosurgery;  Laterality: Left;  left lumbar three-four  . BACK SURGERY    . CATARACT EXTRACTION, BILATERAL Bilateral    about a month apart.   . COLONOSCOPY  04/2007  . ESOPHAGOGASTRODUODENOSCOPY    . ESOPHAGOGASTRODUODENOSCOPY  04/2007; 07/2008  . ESOPHAGOGASTRODUODENOSCOPY (EGD) WITH  ESOPHAGEAL DILATION  "2-3 times"  . EXCISIONAL HEMORRHOIDECTOMY  ~ 1966  . FEMORAL REVISION Left 12/08/2016   Procedure: LEFT KNEE FEMORAL REVISION;  Surgeon: Gaynelle Arabian, MD;  Location: [facility];  Service: Orthopedics;  Laterality: Left;  Adductor Block  . FINGER SURGERY Right    pinky finger  . JOINT REPLACEMENT    . KNEE ARTHROTOMY Left 07/24/2018   Procedure: Left knee arthrotomy with scar excision;  Surgeon: Gaynelle Arabian, MD;  Location: [facility];  Service: Orthopedics;  Laterality: Left;  . KNEE CLOSED REDUCTION Left 06/21/2016   Procedure: CLOSED MANIPULATION LEFT KNEE;  Surgeon: Gaynelle Arabian, MD;  Location: [facility];  Service: Orthopedics;  Laterality: Left;  . KNEE CLOSED REDUCTION Left 02/28/2017   Procedure: CLOSED MANIPULATION KNEE;  Surgeon: Gaynelle Arabian, MD;  Location: [facility];  Service: Orthopedics;  Laterality: Left;  . KNEE JOINT MANIPULATION  06/2009  . LAPAROSCOPIC CHOLECYSTECTOMY  02/2000  . LUMBAR FUSION  03/16/2012   Dr Ronnald Ramp  . ORIF TIBIA PLATEAU Left 09/24/2015   Procedure: OPEN REDUCTION INTERNAL FIXATION (ORIF) LEFT TIBIAL PLATEAU;  Surgeon: Gaynelle Arabian, MD;  Location: [facility];  Service: Orthopedics;  Laterality: Left;  . PARTIAL KNEE ARTHROPLASTY Left 07/30/2015  Procedure: UNICOMPARTMENTAL LEFT KNEE MEDIAL;  Surgeon: Gaynelle Arabian, MD;  Location: [facility];  Service: Orthopedics;  Laterality: Left;  . POSTERIOR LUMBAR FUSION  04/2015   rods inserted L2  . REFRACTIVE SURGERY Right 1980s  . SHOULDER OPEN ROTATOR CUFF REPAIR Bilateral 1990s  . TENNIS ELBOW RELEASE/NIRSCHEL PROCEDURE Right 1980s  . TOTAL KNEE ARTHROPLASTY Right 03/2009  . TOTAL KNEE ARTHROPLASTY WITH REVISION COMPONENTS Left 03/03/2016   Procedure: REVISION LEFT KNEE UNICOMPARTMENTAL ARTHROPLASTY TO TOTAL KNEE ARTHROPLASTY;  Surgeon: Gaynelle Arabian, MD;  Location: [facility];  Service: Orthopedics;  Laterality: Left;  requests 2hrs; Adductor Block    Prior to Admission medications   Medication Sig Start Date  End Date Taking? Authorizing Provider  amiodarone (PACERONE) 200 MG tablet Take 1 tablet (256m) by mouth daily 2 weeks prior to surgery & for 1 month after surgery. 08/31/18  Yes MKathyrn DrownD, NP  aspirin EC 81 MG tablet Take 81 mg by mouth daily.   Yes [provider]  atorvastatin (LIPITOR) 80 MG tablet Take 40-80 mg by mouth See admin instructions. Takes 80 mg by mouth daily on Monday, Wednesday, Fridays and take 40 mg by mouth on all other days of the week   Yes [provider]  cholecalciferol (VITAMIN D3) 25 MCG (1000 UT) tablet Take 1,000 Units by mouth daily.    Yes [provider]  Cyanocobalamin (VITAMIN B-12) 5000 MCG TBDP Take 5,000 mcg by mouth daily.   Yes [provider]  docusate sodium (COLACE) 100 MG capsule Take 200 mg by mouth at bedtime.   Yes [provider]  doxycycline (VIBRAMYCIN) 100 MG capsule Take 100 mg by mouth at bedtime.    Yes [provider]  fenofibrate (TRICOR) 145 MG tablet Take 1 tablet (145 mg total) by mouth daily. 07/04/18  Yes RSkeet Latch MD  finasteride (PROSCAR) 5 MG tablet Take 5 mg by mouth every evening.    Yes [provider]  gabapentin (NEURONTIN) 300 MG capsule Take 300 mg by mouth 3 (three) times daily.   Yes [provider]  glimepiride (AMARYL) 2 MG tablet Take 2 mg by mouth daily with breakfast.   Yes [provider]  Glucosamine-Chondroitin (GLUCOSAMINE CHONDR COMPLEX PO) Take 1 tablet by mouth daily.   Yes [provider]  levothyroxine (SYNTHROID) 50 MCG tablet Take 50-100 mcg by mouth See admin instructions. Take 100 mcg by mouth daily on Monday, Wednesday and Friday. Take 50 mcg by mouth daily on all other days.   Yes [provider]  Methylcellulose, Laxative, (CITRUCEL PO) Take 2 tablets by mouth daily.   Yes [provider]  metoprolol succinate (TOPROL-XL) 25 MG 24 hr tablet Take 1 tablet (25 mg total) by mouth 2 (two)  times daily. 06/06/18  Yes RSkeet Latch MD  Multiple Vitamins-Minerals (CENTRUM SILVER PO) Take 1 tablet by mouth daily.    Yes [provider]  omeprazole (PRILOSEC) 20 MG capsule Take 20 mg by mouth daily.   Yes [provider]  oxyCODONE (OXY IR/ROXICODONE) 5 MG immediate release tablet Take 1 tablet (5 mg total) by mouth every 4 (four) hours as needed for moderate pain or severe pain. Patient taking differently: Take 5 mg by mouth 3 (three) times daily.  12/09/16  Yes Perkins, Alexzandrew L, PA-C  pioglitazone-metformin (ACTOPLUS MET) 15-500 MG per tablet Take 1 tablet by mouth daily.    Yes [provider]  Saw Palmetto 450 MG CAPS Take 450 mg by mouth  daily.   Yes [provider]  Specialty Vitamins Products (BIOTIN PLUS KERATIN PO) Take 1 tablet by mouth at bedtime.   Yes [provider]  tamsulosin (FLOMAX) 0.4 MG CAPS capsule Take 0.4 mg by mouth daily.    Yes [provider]   No Known Allergies  Social History   Tobacco Use  . Smoking status: Former Smoker    Packs/day: 1.50    Years: 15.00    Pack years: 22.50    Types: Cigarettes    Quit date: 1989    Years since quitting: 31.7  . Smokeless tobacco: Never Used  Substance Use Topics  . Alcohol use: Not Currently    Comment: beer occasionally    Family History  Problem Relation Age of Onset  . Diabetes Brother   . Lung cancer Brother   . Stomach cancer Mother   . Lung cancer Father   . Lung cancer Brother   . Hodgkin's lymphoma Brother   . Lung cancer Brother      Review of Systems  Positive ROS: neg  All other systems have been reviewed and were otherwise negative with the exception of those mentioned in the HPI and as above.  Objective: Vital signs in last 24 hours: Temp:  [98 F (36.7 C)] 98 F (36.7 C) (09/10 0621) Pulse Rate:  [62] 62 (09/10 0634) Resp:  [20] 20 (09/10 0634) BP: (105)/(56) 105/56 (09/10 0634) SpO2:  [95 %] 95 % (09/10  0634)  General Appearance: Alert, cooperative, no distress, appears stated age Head: Normocephalic, without obvious abnormality, atraumatic Eyes: PERRL, conjunctiva/corneas clear, EOM's intact    Neck: Supple, symmetrical, trachea midline Back: Symmetric, no curvature, ROM normal, no CVA tenderness Lungs:  respirations unlabored Heart: Regular rate and rhythm Abdomen: Soft, non-tender Extremities: Extremities normal, atraumatic, no cyanosis or edema Pulses: 2+ and symmetric all extremities Skin: Skin color, texture, turgor normal, no rashes or lesions  NEUROLOGIC:   Mental status: Alert and oriented x4,  no aphasia, good attention span, fund of knowledge, and memory Motor Exam - grossly normal Sensory Exam - grossly normal Reflexes: 1+ Coordination - grossly normal Gait - grossly normal Balance - grossly normal Cranial Nerves: I: smell Not tested  II: visual acuity  OS: nl    OD: nl  II: visual fields Full to confrontation  II: pupils Equal, round, reactive to light  III,VII: ptosis None  III,IV,VI: extraocular muscles  Full ROM  V: mastication Normal  V: facial light touch sensation  Normal  V,VII: corneal reflex  Present  VII: facial muscle function - upper  Normal  VII: facial muscle function - lower Normal  VIII: hearing Not tested  IX: soft palate elevation  Normal  IX,X: gag reflex Present  XI: trapezius strength  5/5  XI: sternocleidomastoid strength 5/5  XI: neck flexion strength  5/5  XII: tongue strength  Normal    Data Review Lab Results  Component Value Date   WBC 9.7 07/25/2018   HGB 12.3 (L) 07/25/2018   HCT 37.8 (L) 07/25/2018   MCV 106.2 (H) 07/25/2018   PLT 165 07/25/2018   Lab Results  Component Value Date   NA 139 07/25/2018   K 4.2 07/25/2018   CL 107 07/25/2018   CO2 22 07/25/2018   BUN 16 07/25/2018   CREATININE 0.85 07/25/2018   GLUCOSE 186 (H) 07/25/2018   Lab Results  Component Value Date   INR 1.0 10/12/2018     Assessment/Plan:  Estimated body mass  index is 34.99 kg/m as calculated from the following:   Height as of 10/12/18: 5' 7"  (1.702 m).   Weight as of 10/12/18: 101.3 kg. Patient admitted for PLIF L4-5. Patient has failed a reasonable attempt at conservative therapy.  I explained the condition and procedure to the patient and answered any questions.  Patient wishes to proceed with procedure as planned. Understands risks/ benefits and typical outcomes of procedure.   Eustace Moore 10/19/2018 7:37 AM

## 2018-10-19 NOTE — Op Note (Signed)
10/19/2018  10:58 AM  PATIENT:  [name] MelenaGary P Dyer  78 y.o. 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 Decompressive lumbar laminectomy L4-5 requiring more work than would be required for a simple exposure of the disk for PLIF in order to adequately decompress the neural elements and address the spinal stenosis 2. Posterior lumbar interbody fusion L4-5 using peek interbody cages packed with morcellized allograft and autograft soaked with a bone marrow aspirate obtained through a separate fascial incision over the right iliac crest 3. Posterior fixation L4- S1 inclusive using NuVasive cortical pedicle screws after removal of nonsegmental fixation L5-S1.  4. Intertransverse arthrodesis L4-5 using morcellized autograft and allograft.  SURGEON:  Marikay Alaravid Lio Wehrly, MD  ASSISTANTS: Verlin DikeKimberly Meyran, FNP  ANESTHESIA:  General  EBL: 200 ml  Total I/O In: 1200 [I.V.:1200] Out: 400 [Urine:200; Blood:200]  BLOOD ADMINISTERED:none  DRAINS: none   INDICATION FOR PROCEDURE: This patient presented with back and bilateral leg pain for many months. Imaging revealed adjacent level stenosis L4-5. The patient tried a reasonable attempt at conservative medical measures without relief. I recommended decompression and instrumented fusion to address the stenosis as well as the segmental  instability.  Patient understood the risks, benefits, and alternatives and potential outcomes and wished to proceed.  PROCEDURE DETAILS:  The patient was brought to the operating room. After induction of generalized endotracheal anesthesia the patient was rolled into the prone position on chest rolls and all pressure points were padded. The patient's lumbar region was cleaned and then prepped with DuraPrep and draped in the usual sterile fashion. Anesthesia was injected and then a dorsal midline incision was made and carried down to  the lumbosacral fascia. The fascia was opened and the paraspinous musculature was taken down in a subperiosteal fashion to expose L4-5 as well as the previously placed hardware at L5-S1.  The locking caps were removed at L5-S1 and then the rods were removed.  Each screw had excellent purchase.  A self-retaining retractor was placed. Intraoperative fluoroscopy confirmed my level, and I started with placement of the L4 cortical pedicle screws. The pedicle screw entry zones were identified utilizing surface landmarks and  AP and lateral fluoroscopy. I scored the cortex with the high-speed drill and then used the hand drill to drill an upward and outward direction into the pedicle. I then tapped line to line. I then placed a 6.5 x 40 mm cortical pedicle screw into the pedicles of L4 bilaterally.  I then dissected in a suprafascial plane to expose the iliac crest.  Open the fascia used a Jamshidi needle to extract 60 cc of bone marrow aspirate from the iliac crest.  This was then spun down by [facility] and 2 to 4 cc of  BMAC was soaked on morselized allograft for later arthrodesis.  I dried the hole with Surgifoam and closed the fascia.  I then turned my attention to the decompression and complete lumbar laminectomies, hemi- facetectomies, and foraminotomies were performed at L4-5. The patient had significant spinal stenosis and this required more work than would be required for a simple exposure of the disc for posterior lumbar interbody fusion which would only require a limited laminotomy. Much more generous decompression and generous foraminotomy was undertaken in order to adequately decompress the neural elements and address the patient's leg pain. The yellow ligament was removed to expose the underlying dura and nerve roots, and generous foraminotomies were  performed to adequately decompress the neural elements. Both the exiting and traversing nerve roots were decompressed on both sides until a coronary dilator  passed easily along the nerve roots. Once the decompression was complete, I turned my attention to the posterior lower lumbar interbody fusion. The epidural venous vasculature was coagulated and cut sharply. Disc space was incised and the initial discectomy was performed with pituitary rongeurs. The disc space was distracted with sequential distractors to a height of 10 mm. We then used a series of scrapers and shavers to prepare the endplates for fusion. The midline was prepared with Epstein curettes. Once the complete discectomy was finished, we packed an appropriate sized interbody cage with local autograft and morcellized allograft, gently retracted the nerve root, and tapped the cage into position at L4-5.  The midline between the cages was packed with morselized autograft and allograft. We then decorticated the transverse processes and laid a mixture of morcellized autograft and allograft out over these to perform intertransverse arthrodesis at L4-5. We then placed lordotic rods into the multiaxial screw heads of the pedicle screws from L4-S1 and locked these in position with the locking caps and anti-torque device. We then checked our construct with AP and lateral fluoroscopy. Irrigated with copious amounts of bacitracin-containing saline solution. Inspected the nerve roots once again to assure adequate decompression, lined to the dura with Gelfoam, placed powdered vancomycin into the wound, and closed the muscle and the fascia with 0 Vicryl. Closed the subcutaneous tissues with 2-0 Vicryl and subcuticular tissues with 3-0 Vicryl. The skin was closed with benzoin and Steri-Strips. Dressing was then applied, the patient was awakened from general anesthesia and transported to the recovery room in stable condition. At the end of the procedure all sponge, needle and instrument counts were correct.   PLAN OF CARE: admit to inpatient  PATIENT DISPOSITION:  PACU - hemodynamically stable.   Delay start of  Pharmacological VTE agent (>24hrs) due to surgical blood loss or risk of bleeding:  yes

## 2018-10-19 NOTE — Transfer of Care (Signed)
Immediate Anesthesia Transfer of Care Note  Patient: [name]  Procedure(s) Performed: LUMBAR FOUR-FIVE POSTERIOR LUMBAR INTERBODY FUSION WITH EXTENSION OF FUSION (N/A Spine Lumbar)  Patient Location: PACU  Anesthesia Type:General  Level of Consciousness: drowsy and patient cooperative  Airway & Oxygen Therapy: Patient Spontanous Breathing and Patient connected to face mask oxygen  Post-op Assessment: Report given to RN and Post -op Vital signs reviewed and stable  Post vital signs: Reviewed and stable  Last Vitals:  Vitals Value Taken Time  BP 129/73 10/19/18 1057  Temp    Pulse 83 10/19/18 1058  Resp 16 10/19/18 1058  SpO2 99 % 10/19/18 1058  Vitals shown include unvalidated device data.  Last Pain:  Vitals:   10/19/18 0621  TempSrc: Oral  PainSc:          Complications: No apparent anesthesia complications

## 2018-10-19 NOTE — Anesthesia Preprocedure Evaluation (Signed)
Anesthesia Evaluation  Patient identified by MRN, date of birth, ID band Patient awake    Reviewed: Allergy & Precautions, NPO status , Patient's Chart, lab work & pertinent test results, reviewed documented beta blocker date and time   History of Anesthesia Complications Negative for: history of anesthetic complications  Airway Mallampati: II  TM Distance: >3 FB Neck ROM: Full    Dental  (+) Dental Advisory Given, Teeth Intact   Pulmonary sleep apnea and Continuous Positive Airway Pressure Ventilation , former smoker,    breath sounds clear to auscultation       Cardiovascular (-) angina+ dysrhythmias Atrial Fibrillation  Rhythm:Regular Rate:Normal   Hx perioperative a-fib, resolved. No anticoagulants, no recurrence of a-fib during tele monitoring. On amio  '18 TTE - Mild LVH. EF 60% to 65%. Grade 1 diastolic dysfunction. Trivial MR. PASP: 29 mm Hg    Neuro/Psych negative neurological ROS  negative psych ROS   GI/Hepatic Neg liver ROS, PUD, GERD  Medicated and Controlled,Dysphagia, barretts esophagus s/p esophageal dilatation Duodenal ulcers    Endo/Other  diabetes, Type 2, Oral Hypoglycemic AgentsObesity BMI 35  Renal/GU negative Renal ROS   BPH    Musculoskeletal  (+) Arthritis , Osteoarthritis,    Abdominal   Peds  Hematology negative hematology ROS (+)   Anesthesia Other Findings   Reproductive/Obstetrics negative OB ROS                             Anesthesia Physical  Anesthesia Plan  ASA: III  Anesthesia Plan: General   Post-op Pain Management:    Induction: Intravenous  PONV Risk Score and Plan: 2 and Treatment may vary due to age or medical condition, Ondansetron and Dexamethasone  Airway Management Planned: Oral ETT  Additional Equipment: None  Intra-op Plan:   Post-operative Plan: Extubation in OR  Informed Consent: I have reviewed the patients History and  Physical, chart, labs and discussed the procedure including the risks, benefits and alternatives for the proposed anesthesia with the patient or authorized representative who has indicated his/her understanding and acceptance.     Dental advisory given  Plan Discussed with: CRNA  Anesthesia Plan Comments:         Anesthesia Quick Evaluation

## 2018-10-19 NOTE — Anesthesia Procedure Notes (Signed)
Procedure Name: Intubation Date/Time: 10/19/2018 8:15 AM Performed by: Kathryne Hitch, CRNA Pre-anesthesia Checklist: Patient identified, Emergency Drugs available, Suction available and Patient being monitored Patient Re-evaluated:Patient Re-evaluated prior to induction Oxygen Delivery Method: Circle system utilized Preoxygenation: Pre-oxygenation with 100% oxygen Induction Type: IV induction Ventilation: Mask ventilation without difficulty and Oral airway inserted - appropriate to patient size Laryngoscope Size: Glidescope and 4 Grade View: Grade I Tube type: Oral Tube size: 7.5 mm Number of attempts: 1 Airway Equipment and Method: Stylet and Oral airway Placement Confirmation: ETT inserted through vocal cords under direct vision,  positive ETCO2 and breath sounds checked- equal and bilateral Secured at: 23 cm Tube secured with: Tape Dental Injury: Teeth and Oropharynx as per pre-operative assessment

## 2018-10-20 LAB — GLUCOSE, CAPILLARY: Glucose-Capillary: 189 mg/dL — ABNORMAL HIGH (ref 70–99)

## 2018-10-20 MED ORDER — OXYCODONE HCL 5 MG PO TABS: 5.0000 mg | ORAL_TABLET | ORAL | 0 refills | Status: DC | PRN

## 2018-10-20 NOTE — Discharge Summary (Signed)
Physician Discharge Summary  Patient ID: [name] MRN: [id number] DOB/AGE: 02/20/1940 78 y.o.  Admit date: 10/19/2018 Discharge date: 10/20/2018  Admission Diagnoses: adjacent level stenosis l4-5    Discharge Diagnoses: same   Discharged Condition: good  Hospital Course: The patient was admitted on 10/19/2018 and taken to the operating room where the patient underwent PLIF L4-5. The patient tolerated the procedure well and was taken to the recovery room and then to the floor in stable condition. The hospital course was routine. There were no complications. The wound remained clean dry and intact. Pt had appropriate back soreness. No complaints of leg pain or new N/T/W. The patient remained afebrile with stable vital signs, and tolerated a regular diet. The patient continued to increase activities, and pain was well controlled with oral pain medications.   Consults: None  Significant Diagnostic Studies:  Results for orders placed or performed during the hospital encounter of 10/19/18  Glucose, capillary  Result Value Ref Range   Glucose-Capillary 166 (H) 70 - 99 mg/dL  Glucose, capillary  Result Value Ref Range   Glucose-Capillary 161 (H) 70 - 99 mg/dL  Glucose, capillary  Result Value Ref Range   Glucose-Capillary 287 (H) 70 - 99 mg/dL   Comment 1 Notify RN    Comment 2 Document in Chart   Glucose, capillary  Result Value Ref Range   Glucose-Capillary 202 (H) 70 - 99 mg/dL   Comment 1 Notify RN    Comment 2 Document in Chart   Glucose, capillary  Result Value Ref Range   Glucose-Capillary 189 (H) 70 - 99 mg/dL   Comment 1 Notify RN    Comment 2 Document in Chart   Type and screen [facility]  Result Value Ref Range   ABO/RH(D) A POS    Antibody Screen POS    Sample Expiration 10/22/2018,2359    Antibody Identification      NON SPECIFIC COLD ANTIBODY Performed at [facility], [address], [place], [place] [address]     Chest 2  View  Result Date: 10/12/2018 CLINICAL DATA:  Preop exam. Scheduled for lumbar fusion surgery on September 10th. EXAM: CHEST - 2 VIEW COMPARISON:  Chest x-ray dated 08/31/2015. FINDINGS: Heart size and mediastinal contours are within normal limits. Lungs are clear. No pleural effusions seen. No acute or suspicious osseous finding. Anterior cervical fusion hardware within the lower cervical spine. IMPRESSION: No active cardiopulmonary disease. No evidence of pneumonia or pulmonary edema. Electronically Signed   By: Franki Cabot M.D.   On: 10/12/2018 15:45   Dg Lumbar Spine 2-3 Views  Result Date: 10/19/2018 CLINICAL DATA:  L4-5 fusion EXAM: LUMBAR SPINE - 2-3 VIEW; DG C-ARM 1-60 MIN COMPARISON:  None. FLUOROSCOPY TIME:  Radiation Exposure Index (as provided by the fluoroscopic device): Not available If the device does not provide the exposure index: Fluoroscopy Time:  36 seconds Number of Acquired Images:  2 FINDINGS: There are now changes consistent with interbody fusion at L4-5 with pedicle screw placement at L4 and posterior fixation. Previous fusion at L3-4 and L5-S1 is noted. IMPRESSION: L4-5 fusion Electronically Signed   By: Inez Catalina M.D.   On: 10/19/2018 12:27   Dg C-arm 1-60 Min  Result Date: 10/19/2018 CLINICAL DATA:  L4-5 fusion EXAM: LUMBAR SPINE - 2-3 VIEW; DG C-ARM 1-60 MIN COMPARISON:  None. FLUOROSCOPY TIME:  Radiation Exposure Index (as provided by the fluoroscopic device): Not available If the device does not provide the exposure index:  Fluoroscopy Time:  36 seconds Number of Acquired Images:  2 FINDINGS: There are now changes consistent with interbody fusion at L4-5 with pedicle screw placement at L4 and posterior fixation. Previous fusion at L3-4 and L5-S1 is noted. IMPRESSION: L4-5 fusion Electronically Signed   By: Inez Catalina M.D.   On: 10/19/2018 12:27    Antibiotics:  Anti-infectives (From admission, onward)   Start     Dose/Rate Route Frequency Ordered Stop   10/19/18  2200  doxycycline (VIBRA-TABS) tablet 100 mg     100 mg Oral Daily at bedtime 10/19/18 1228     10/19/18 1600  ceFAZolin (ANCEF) IVPB 2g/100 mL premix     2 g 200 mL/hr over 30 Minutes Intravenous Every 8 hours 10/19/18 1228 10/19/18 2349   10/19/18 0847  bacitracin 50,000 Units in sodium chloride 0.9 % 500 mL irrigation  Status:  Discontinued       As needed 10/19/18 0847 10/19/18 1053   10/19/18 0615  ceFAZolin (ANCEF) IVPB 2g/100 mL premix     2 g 200 mL/hr over 30 Minutes Intravenous To Short Stay 10/19/18 0551 10/19/18 0830      Discharge Exam: Blood pressure 116/72, pulse 65, temperature 99.1 F (37.3 C), temperature source Oral, resp. rate 16, height 5' 7"  (1.702 m), weight 101.3 kg, SpO2 98 %. Neurologic: Grossly normal Dressing dry  Discharge Medications:   Allergies as of 10/20/2018   No Known Allergies     Medication List    TAKE these medications   amiodarone 200 MG tablet Commonly known as: PACERONE Take 1 tablet (284m) by mouth daily 2 weeks prior to surgery & for 1 month after surgery.   aspirin EC 81 MG tablet Take 81 mg by mouth daily.   atorvastatin 80 MG tablet Commonly known as: LIPITOR Take 40-80 mg by mouth See admin instructions. Takes 80 mg by mouth daily on Monday, Wednesday, Fridays and take 40 mg by mouth on all other days of the week   BIOTIN PLUS KERATIN PO Take 1 tablet by mouth at bedtime.   CENTRUM SILVER PO Take 1 tablet by mouth daily.   cholecalciferol 25 MCG (1000 UT) tablet Commonly known as: VITAMIN D3 Take 1,000 Units by mouth daily.   CITRUCEL PO Take 2 tablets by mouth daily.   docusate sodium 100 MG capsule Commonly known as: COLACE Take 200 mg by mouth at bedtime.   doxycycline 100 MG capsule Commonly known as: VIBRAMYCIN Take 100 mg by mouth at bedtime.   fenofibrate 145 MG tablet Commonly known as: TRICOR Take 1 tablet (145 mg total) by mouth daily.   finasteride 5 MG tablet Commonly known as: PROSCAR Take 5  mg by mouth every evening.   gabapentin 300 MG capsule Commonly known as: NEURONTIN Take 300 mg by mouth 3 (three) times daily.   glimepiride 2 MG tablet Commonly known as: AMARYL Take 2 mg by mouth daily with breakfast.   GLUCOSAMINE CHONDR COMPLEX PO Take 1 tablet by mouth daily.   levothyroxine 50 MCG tablet Commonly known as: SYNTHROID Take 50-100 mcg by mouth See admin instructions. Take 100 mcg by mouth daily on Monday, Wednesday and Friday. Take 50 mcg by mouth daily on all other days.   metoprolol succinate 25 MG 24 hr tablet Commonly known as: TOPROL-XL Take 1 tablet (25 mg total) by mouth 2 (two) times daily.   omeprazole 20 MG capsule Commonly known as: PRILOSEC Take 20 mg by mouth daily.   oxyCODONE 5 MG  immediate release tablet Commonly known as: Oxy IR/ROXICODONE Take 1 tablet (5 mg total) by mouth every 4 (four) hours as needed for moderate pain or severe pain. What changed:   when to take this  reasons to take this   pioglitazone-metformin 15-500 MG tablet Commonly known as: ACTOPLUS MET Take 1 tablet by mouth daily.   Saw Palmetto 450 MG Caps Take 450 mg by mouth daily.   tamsulosin 0.4 MG Caps capsule Commonly known as: FLOMAX Take 0.4 mg by mouth daily.   Vitamin B-12 5000 MCG Tbdp Take 5,000 mcg by mouth daily.            Durable Medical Equipment  (From admission, onward)         Start     Ordered   10/19/18 1229  DME Walker rolling  Once    Question:  Patient needs a walker to treat with the following condition  Answer:  S/P lumbar fusion   10/19/18 1228   10/19/18 1229  DME 3 n 1  Once     10/19/18 1228          Disposition: home   Final Dx: PLIF L4-5  Discharge Instructions     Remove dressing in 72 hours   Complete by: As directed    Call MD for:  difficulty breathing, headache or visual disturbances   Complete by: As directed    Call MD for:  persistant nausea and vomiting   Complete by: As directed    Call MD  for:  redness, tenderness, or signs of infection (pain, swelling, redness, odor or green/yellow discharge around incision site)   Complete by: As directed    Call MD for:  severe uncontrolled pain   Complete by: As directed    Call MD for:  temperature >100.4   Complete by: As directed    Diet - low sodium heart healthy   Complete by: As directed    Increase activity slowly   Complete by: As directed       Follow-up Information    Eustace Moore, MD. Schedule an appointment as soon as possible for a visit in 2 week(s).   Specialty: Neurosurgery Contact information: [address] [place] [address] [place] [address] [phone]            Signed: Eustace Moore 10/20/2018, 7:45 AM

## 2018-10-20 NOTE — Progress Notes (Signed)
Patient is discharged from room 3C10 at this time. Alert and in stable condition. IV site d/c'd and instruction read to patient with understanding verbalized. Left unit via wheelchair with all belongings at side.

## 2018-10-20 NOTE — Evaluation (Signed)
Occupational Therapy Evaluation Patient Details Name: [name] MRN: [id number] DOB: 11-Apr-1940 Today's Date: 10/20/2018    History of Present Illness Patient is a 78 y/o male who presents s/p L4-5 PLIF with extension of fusion. PMH includes multiple left knee surgeries and back surgeries, DM, OSA on CPAP, HLD.   Clinical Impression   PTA Pt mod I with AE for LB ADL, and Pt is close to baseline currently post-op. Able to recall 3/3 back precautions. Back handout provided and reviewed adls in detail. Pt educated on: clothing between brace, never sleep in brace, set an alarm at night for medication, avoid sitting for long periods of time, correct bed positioning for sleeping, correct sequence for bed mobility, avoiding lifting more than 5 pounds and never wash directly over incision. All education is complete and patient indicates understanding. Pt will have assist from his wife at home for LLE dressing,and verbalized understanding for long handle sponge to assist in bathing. OT will sign off at this time.     Follow Up Recommendations  No OT follow up;Supervision - Intermittent    Equipment Recommendations  None recommended by OT(Pt has appropriate DME)    Recommendations for Other Services       Precautions / Restrictions Precautions Precautions: Fall;Back Precaution Booklet Issued: Yes (comment) Precaution Comments: Reviewed back precautions and handout Required Braces or Orthoses: Spinal Brace Spinal Brace: Lumbar corset;Applied in standing position Restrictions Weight Bearing Restrictions: No      Mobility Bed Mobility Overal bed mobility: Needs Assistance Bed Mobility: Rolling;Sidelying to Sit Rolling: Supervision Sidelying to sit: Supervision;HOB elevated       General bed mobility comments: Cues for log roll technique, HOB flat, no use of rail to simulate home.  Transfers Overall transfer level: Needs assistance Equipment used: Straight cane Transfers: Sit  to/from Stand Sit to Stand: Supervision         General transfer comment: Supervision for safety. Stood from EOB without difficulty.    Balance Overall balance assessment: Needs assistance Sitting-balance support: Feet supported;No upper extremity supported Sitting balance-Leahy Scale: Good     Standing balance support: During functional activity Standing balance-Leahy Scale: Fair Standing balance comment: ABle to donn brace in standing without difficulty.                           ADL either performed or assessed with clinical judgement   ADL Overall ADL's : Needs assistance/impaired Eating/Feeding: Independent   Grooming: Min guard;Standing   Upper Body Bathing: Min guard;With adaptive equipment Upper Body Bathing Details (indicate cue type and reason): educated on long handle sponge Lower Body Bathing: Min guard   Upper Body Dressing : Supervision/safety Upper Body Dressing Details (indicate cue type and reason): able to don/doff brace without assist Lower Body Dressing: Minimal assistance;Sit to/from stand Lower Body Dressing Details (indicate cue type and reason): to get socks on, able to don/doff underwear and shorts Toilet Transfer: Supervision/safety;Ambulation;RW   Toileting- Clothing Manipulation and Hygiene: Min guard;Sitting/lateral lean   Tub/ Shower Transfer: Walk-in shower;Min guard;Ambulation   Functional mobility during ADLs: Supervision/safety;Cane General ADL Comments: Pt will have assist from wife for LB dressing, also has longhandle shoe horn     Vision Baseline Vision/History: Wears glasses Patient Visual Report: No change from baseline       Perception     Praxis      Pertinent Vitals/Pain Pain Assessment: 0-10 Pain Score: 3  Faces Pain Scale: Hurts little more Pain  Location: back, left knee Pain Descriptors / Indicators: Sore;Aching;Operative site guarding Pain Intervention(s): Limited activity within patient's  tolerance;Monitored during session;Repositioned     Hand Dominance Right   Extremity/Trunk Assessment Upper Extremity Assessment Upper Extremity Assessment: Overall WFL for tasks assessed   Lower Extremity Assessment Lower Extremity Assessment: LLE deficits/detail LLE Deficits / Details: Has had multiple knee surgeries, needs more. LLE Sensation: WNL   Cervical / Trunk Assessment Cervical / Trunk Assessment: Other exceptions Cervical / Trunk Exceptions: s/p back surgery   Communication Communication Communication: No difficulties   Cognition Arousal/Alertness: Awake/alert Behavior During Therapy: WFL for tasks assessed/performed Overall Cognitive Status: Within Functional Limits for tasks assessed                                     General Comments       Exercises     Shoulder Instructions      Home Living Family/patient expects to be discharged to:: Private residence Living Arrangements: Spouse/significant other Available Help at Discharge: Family;Available 24 hours/day Type of Home: House Home Access: Stairs to enter Entergy CorporationEntrance Stairs-Number of Steps: threshold Entrance Stairs-Rails: None Home Layout: One level     Bathroom Shower/Tub: Producer, television/film/videoWalk-in shower   Bathroom Toilet: Handicapped height     Home Equipment: Environmental consultantWalker - 2 wheels;Bedside commode;Wheelchair - manual;Cane - single point          Prior Functioning/Environment Level of Independence: Independent with assistive device(s)        Comments: Uses SPC for ambulation. Drives. Works part time doing accounts payable 3 times/week for company. Does not do much IADLs.        OT Problem List: Decreased activity tolerance;Impaired balance (sitting and/or standing);Decreased knowledge of use of DME or AE;Decreased knowledge of precautions;Pain      OT Treatment/Interventions:      OT Goals(Current goals can be found in the care plan section) Acute Rehab OT Goals Patient Stated Goal: to  get home ASAP OT Goal Formulation: With patient Time For Goal Achievement: 11/03/18 Potential to Achieve Goals: Good  OT Frequency:     Barriers to D/C:            Co-evaluation              AM-PAC OT "6 Clicks" Daily Activity     Outcome Measure Help from another person eating meals?: None Help from another person taking care of personal grooming?: A Little Help from another person toileting, which includes using toliet, bedpan, or urinal?: A Little Help from another person bathing (including washing, rinsing, drying)?: A Little Help from another person to put on and taking off regular upper body clothing?: None Help from another person to put on and taking off regular lower body clothing?: A Little 6 Click Score: 20   End of Session Equipment Utilized During Treatment: Back brace;Other (comment)(SPC) Nurse Communication: Mobility status  Activity Tolerance: Patient tolerated treatment well Patient left: in bed;with call bell/phone within reach  OT Visit Diagnosis: Unsteadiness on feet (R26.81);Pain Pain - Right/Left: (central) Pain - part of body: (lumbar spine)                Time: [id number] OT Time Calculation (min): 13 min Charges:  OT General Charges $OT Visit: 1 Visit OT Evaluation $OT Eval Low Complexity: 1 Low  Sherryl MangesLaura Thane Age OTR/L Acute Rehabilitation Services Pager: [phone] Office: [phone]  Evern BioLaura J Danna Sewell 10/20/2018, 12:00 PM

## 2018-10-20 NOTE — Evaluation (Signed)
Physical Therapy Evaluation Patient Details Name: [name] MelenaGary P Dyer MRN: [id number] DOB: 1940-05-26 Today's Date: 10/20/2018   History of Present Illness  Patient is a 78 y/o male who presents s/p L4-5 PLIF with extension of fusion. PMH includes multiple left knee surgeries and back surgeries, DM, OSA on CPAP, HLD.  Clinical Impression  Patient presents with pain and post surgical deficits s/p above surgery. Pt Mod I PTA and uses SPC for ambulation. Pt cares for self and works part time doing accounts receivable for a company. Today, pt requires Min guard-supervision for balance/safety and use of SPC for support. Education re: back precautions, positioning, log roll technique, walking program etc. Will follow acutely to maximize independence and mobility prior to return home.     Follow Up Recommendations No PT follow up;Supervision - Intermittent    Equipment Recommendations  None recommended by PT    Recommendations for Other Services       Precautions / Restrictions Precautions Precautions: Fall;Back Precaution Booklet Issued: Yes (comment) Precaution Comments: Reviewed back precautions and handout Required Braces or Orthoses: Spinal Brace Spinal Brace: Lumbar corset;Applied in standing position Restrictions Weight Bearing Restrictions: No      Mobility  Bed Mobility Overal bed mobility: Needs Assistance Bed Mobility: Rolling;Sidelying to Sit Rolling: Supervision Sidelying to sit: Supervision;HOB elevated       General bed mobility comments: Cues for log roll technique, HOB flat, no use of rail to simulate home.  Transfers Overall transfer level: Needs assistance Equipment used: Straight cane Transfers: Sit to/from Stand Sit to Stand: Supervision         General transfer comment: Supervision for safety. Stood from EOB without difficulty.  Ambulation/Gait Ambulation/Gait assistance: Min guard Gait Distance (Feet): 350 Feet Assistive device: Straight cane Gait  Pattern/deviations: Step-through pattern;Decreased stance time - left;Decreased stride length;Antalgic Gait velocity: decreased   General Gait Details: Slow, unsteady gait with decreased stance time LLE due to hx of knee pain/surgeries. 2/4 DOE.  Stairs            Wheelchair Mobility    Modified Rankin (Stroke Patients Only)       Balance Overall balance assessment: Needs assistance Sitting-balance support: Feet supported;No upper extremity supported Sitting balance-Leahy Scale: Good     Standing balance support: During functional activity Standing balance-Leahy Scale: Fair Standing balance comment: ABle to donn brace in standing without difficulty.                             Pertinent Vitals/Pain Pain Assessment: Faces Faces Pain Scale: Hurts little more Pain Location: back, left knee Pain Descriptors / Indicators: Sore;Aching;Operative site guarding Pain Intervention(s): Repositioned;Monitored during session    Home Living Family/patient expects to be discharged to:: Private residence   Available Help at Discharge: Family;Available 24 hours/day Type of Home: House Home Access: Stairs to enter Entrance Stairs-Rails: None Entrance Stairs-Number of Steps: threshold Home Layout: One level Home Equipment: Environmental consultantWalker - 2 wheels;Bedside commode;Wheelchair - manual;Cane - single point      Prior Function Level of Independence: Independent with assistive device(s)         Comments: Uses SPC for ambulation. Drives. Works part time doing accounts payable 3 times/week for company. Does not do much IADLs.     Hand Dominance   Dominant Hand: Right    Extremity/Trunk Assessment   Upper Extremity Assessment Upper Extremity Assessment: Defer to OT evaluation    Lower Extremity Assessment Lower Extremity Assessment: LLE deficits/detail  LLE Deficits / Details: Has had multiple knee surgeries, needs more. LLE Sensation: WNL    Cervical / Trunk  Assessment Cervical / Trunk Assessment: Other exceptions Cervical / Trunk Exceptions: s/p back surgery  Communication   Communication: No difficulties  Cognition Arousal/Alertness: Awake/alert Behavior During Therapy: WFL for tasks assessed/performed Overall Cognitive Status: Within Functional Limits for tasks assessed                                        General Comments      Exercises     Assessment/Plan    PT Assessment Patient needs continued PT services  PT Problem List Decreased strength;Decreased mobility;Pain;Decreased balance;Decreased activity tolerance;Cardiopulmonary status limiting activity;Decreased skin integrity;Decreased knowledge of precautions       PT Treatment Interventions Therapeutic activities;Gait training;Therapeutic exercise;Patient/family education;Balance training;Neuromuscular re-education;Functional mobility training;Stair training    PT Goals (Current goals can be found in the Care Plan section)  Acute Rehab PT Goals Patient Stated Goal: to get home ASAP PT Goal Formulation: With patient Time For Goal Achievement: 11/03/18 Potential to Achieve Goals: Good    Frequency Min 5X/week   Barriers to discharge        Co-evaluation               AM-PAC PT "6 Clicks" Mobility  Outcome Measure Help needed turning from your back to your side while in a flat bed without using bedrails?: A Little Help needed moving from lying on your back to sitting on the side of a flat bed without using bedrails?: A Little Help needed moving to and from a bed to a chair (including a wheelchair)?: None Help needed standing up from a chair using your arms (e.g., wheelchair or bedside chair)?: None Help needed to walk in hospital room?: A Little Help needed climbing 3-5 steps with a railing? : A Little 6 Click Score: 20    End of Session Equipment Utilized During Treatment: Back brace;Gait belt Activity Tolerance: Patient tolerated  treatment well Patient left: in bed;with call bell/phone within reach(sitting EOB) Nurse Communication: Mobility status PT Visit Diagnosis: Unsteadiness on feet (R26.81);Pain;Difficulty in walking, not elsewhere classified (R26.2) Pain - part of body: (back)    Time: 4166-0630 PT Time Calculation (min) (ACUTE ONLY): 12 min   Charges:   PT Evaluation $PT Eval Moderate Complexity: 1 Mod          Wray Kearns, PT, DPT Acute Rehabilitation Services Pager [phone] Office [phone]      Marguarite Arbour A Sabra Heck 10/20/2018, 8:58 AM

## 2018-10-20 NOTE — Discharge Instructions (Signed)

## 2018-10-23 LAB — TYPE AND SCREEN
ABO/RH(D): A POS
Antibody Screen: POSITIVE
Unit division: 0
Unit division: 0

## 2018-10-23 LAB — BPAM RBC
Blood Product Expiration Date: 202009302359
Blood Product Expiration Date: 202009302359
Unit Type and Rh: 6200
Unit Type and Rh: 6200

## 2018-12-18 ENCOUNTER — Other Ambulatory Visit: Payer: Self-pay | Admitting: Neurological Surgery

## 2018-12-18 DIAGNOSIS — M48062 Spinal stenosis, lumbar region with neurogenic claudication: Secondary | ICD-10-CM

## 2019-01-12 ENCOUNTER — Ambulatory Visit
Admission: RE | Admit: 2019-01-12 | Discharge: 2019-01-12 | Disposition: A | Discharge status: Home / Self Care | Payer: Medicare Other | Source: Ambulatory Visit | Attending: Neurological Surgery | Admitting: Neurological Surgery

## 2019-01-12 DIAGNOSIS — M48062 Spinal stenosis, lumbar region with neurogenic claudication: Secondary | ICD-10-CM

## 2019-01-12 MED ORDER — GADOBENATE DIMEGLUMINE 529 MG/ML IV SOLN
20.0000 mL | Freq: Once | INTRAVENOUS | Status: AC | PRN
  Administered 2019-01-12: 20 mL via INTRAVENOUS

## 2019-03-21 ENCOUNTER — Ambulatory Visit (INDEPENDENT_AMBULATORY_CARE_PROVIDER_SITE_OTHER): Payer: Medicare Other | Admitting: Cardiovascular Disease

## 2019-03-21 ENCOUNTER — Other Ambulatory Visit: Payer: Self-pay

## 2019-03-21 ENCOUNTER — Encounter: Payer: Self-pay | Admitting: Cardiovascular Disease

## 2019-03-21 VITALS — BP 124/78 | HR 54 | Ht 67.0 in | Wt 227.0 lb

## 2019-03-21 DIAGNOSIS — R0600 Dyspnea, unspecified: Secondary | ICD-10-CM | POA: Diagnosis not present

## 2019-03-21 DIAGNOSIS — G4733 Obstructive sleep apnea (adult) (pediatric): Secondary | ICD-10-CM

## 2019-03-21 DIAGNOSIS — I480 Paroxysmal atrial fibrillation: Secondary | ICD-10-CM | POA: Diagnosis not present

## 2019-03-21 DIAGNOSIS — E782 Mixed hyperlipidemia: Secondary | ICD-10-CM

## 2019-03-21 DIAGNOSIS — R0609 Other forms of dyspnea: Secondary | ICD-10-CM | POA: Insufficient documentation

## 2019-03-21 DIAGNOSIS — Z9989 Dependence on other enabling machines and devices: Secondary | ICD-10-CM

## 2019-03-21 HISTORY — DX: Other forms of dyspnea: R06.09

## 2019-03-21 HISTORY — DX: Dyspnea, unspecified: R06.00

## 2019-03-21 NOTE — Patient Instructions (Signed)
Medication Instructions:  STOP AMIODARONE   *If you need a refill on your cardiac medications before your next appointment, please call your pharmacy*  Lab Work: NONE   Testing/Procedures: Your physician has requested that you have an echocardiogram. Echocardiography is a painless test that uses sound waves to create images of your heart. It provides your doctor with information about the size and shape of your heart and how well your heart's chambers and valves are working. This procedure takes approximately one hour. There are no restrictions for this procedure. [address]  Your physician has requested that you have a lexiscan myoview. For further information please visit [email or web address]. Please follow instruction sheet, as given.  Follow-Up: At [facility], you and your health needs are our priority.  As part of our continuing mission to provide you with exceptional heart care, we have created designated Provider Care Teams.  These Care Teams include your primary Cardiologist (physician) and Advanced Practice Providers (APPs -  Physician Assistants and Nurse Practitioners) who all work together to provide you with the care you need, when you need it.  Your next appointment:   2 month(s)  The format for your next appointment:   In Person  Provider:   You may see [name], MD or one of the following Advanced Practice Providers on your designated Care Team:    Kerin Ransom, PA-C  [place], [place]  Coletta Memos, [place]  Other Instructions   Cardiac Nuclear Scan A cardiac nuclear scan is a test that is done to check the flow of blood to your heart. It is done when you are resting and when you are exercising. The test looks for problems such as:  Not enough blood reaching a portion of the heart.  The heart muscle not working as it should. You may need this test if:  You have heart disease.  You have had lab results that are  not normal.  You have had heart surgery or a balloon procedure to open up blocked arteries (angioplasty).  You have chest pain.  You have shortness of breath. In this test, a special dye (tracer) is put into your bloodstream. The tracer will travel to your heart. A camera will then take pictures of your heart to see how the tracer moves through your heart. This test is usually done at a hospital and takes 2-4 hours. Tell a doctor about:  Any allergies you have.  All medicines you are taking, including vitamins, herbs, eye drops, creams, and over-the-counter medicines.  Any problems you or family members have had with anesthetic medicines.  Any blood disorders you have.  Any surgeries you have had.  Any medical conditions you have.  Whether you are pregnant or may be pregnant. What are the risks? Generally, this is a safe test. However, problems may occur, such as:  Serious chest pain and heart attack. This is only a risk if the stress portion of the test is done.  Rapid heartbeat.  A feeling of warmth in your chest. This feeling usually does not last long.  Allergic reaction to the tracer. What happens before the test?  Ask your doctor about changing or stopping your normal medicines. This is important.  Follow instructions from your doctor about what you cannot eat or drink.  Remove your jewelry on the day of the test. What happens during the test?  An IV tube will be inserted into one of your veins.  Your doctor  will give you a small amount of tracer through the IV tube.  You will wait for 20-40 minutes while the tracer moves through your bloodstream.  Your heart will be monitored with an electrocardiogram (ECG).  You will lie down on an exam table.  Pictures of your heart will be taken for about 15-20 minutes.  You may also have a stress test. For this test, one of these things may be done: ? You will be asked to exercise on a treadmill or a stationary  bike. ? You will be given medicines that will make your heart work harder. This is done if you are unable to exercise.  When blood flow to your heart has peaked, a tracer will again be given through the IV tube.  After 20-40 minutes, you will get back on the exam table. More pictures will be taken of your heart.  Depending on the tracer that is used, more pictures may need to be taken 3-4 hours later.  Your IV tube will be removed when the test is over. The test may vary among doctors and hospitals. What happens after the test?  Ask your doctor: ? Whether you can return to your normal schedule, including diet, activities, and medicines. ? Whether you should drink more fluids. This will help to remove the tracer from your body. Drink enough fluid to keep your pee (urine) pale yellow.  Ask your doctor, or the department that is doing the test: ? When will my results be ready? ? How will I get my results? Summary  A cardiac nuclear scan is a test that is done to check the flow of blood to your heart.  Tell your doctor whether you are pregnant or may be pregnant.  Before the test, ask your doctor about changing or stopping your normal medicines. This is important.  Ask your doctor whether you can return to your normal activities. You may be asked to drink more fluids. This information is not intended to replace advice given to you by your health care provider. Make sure you discuss any questions you have with your health care provider. Document Revised: 05/17/2018 Document Reviewed: 07/11/2017 Elsevier Patient Education  2020 [employer].  Echocardiogram An echocardiogram is a procedure that uses painless sound waves (ultrasound) to produce an image of the heart. Images from an echocardiogram can provide important information about:  Signs of coronary artery disease (CAD).  Aneurysm detection. An aneurysm is a weak or damaged part of an artery wall that bulges out from the normal  force of blood pumping through the body.  Heart size and shape. Changes in the size or shape of the heart can be associated with certain conditions, including heart failure, aneurysm, and CAD.  Heart muscle function.  Heart valve function.  Signs of a past heart attack.  Fluid buildup around the heart.  Thickening of the heart muscle.  A tumor or infectious growth around the heart valves. Tell a health care provider about:  Any allergies you have.  All medicines you are taking, including vitamins, herbs, eye drops, creams, and over-the-counter medicines.  Any blood disorders you have.  Any surgeries you have had.  Any medical conditions you have.  Whether you are pregnant or may be pregnant. What are the risks? Generally, this is a safe procedure. However, problems may occur, including:  Allergic reaction to dye (contrast) that may be used during the procedure. What happens before the procedure? No specific preparation is needed. You may eat and  drink normally. What happens during the procedure?   An IV tube may be inserted into one of your veins.  You may receive contrast through this tube. A contrast is an injection that improves the quality of the pictures from your heart.  A gel will be applied to your chest.  A wand-like tool (transducer) will be moved over your chest. The gel will help to transmit the sound waves from the transducer.  The sound waves will harmlessly bounce off of your heart to allow the heart images to be captured in real-time motion. The images will be recorded on a computer. The procedure may vary among health care providers and hospitals. What happens after the procedure?  You may return to your normal, everyday life, including diet, activities, and medicines, unless your health care provider tells you not to do that. Summary  An echocardiogram is a procedure that uses painless sound waves (ultrasound) to produce an image of the  heart.  Images from an echocardiogram can provide important information about the size and shape of your heart, heart muscle function, heart valve function, and fluid buildup around your heart.  You do not need to do anything to prepare before this procedure. You may eat and drink normally.  After the echocardiogram is completed, you may return to your normal, everyday life, unless your health care provider tells you not to do that. This information is not intended to replace advice given to you by your health care provider. Make sure you discuss any questions you have with your health care provider. Document Revised: 05/18/2018 Document Reviewed: 02/28/2016 Elsevier Patient Education  2020 [employer].

## 2019-03-21 NOTE — Progress Notes (Signed)
Cardiology Office Note   Date:  03/21/2019   ID:  Cortavious, Nix [birth year], MRN [id number]  PCP:  [name], MD  Cardiologist:   [name], MD   Chief Complaint  Patient presents with  . Follow-up    6 months.  . Shortness of Breath     History of Present Illness: JOSPH NORFLEET is a 79 y.o. male with paroxysmal atrial fibrillation, diabetes, hyperlipidemia, and OSA on CPAP, who presents for follow-up. Mr. Summerlin was initially seen 06/21/16 when he developed postoperative atrial fibrillation. He underwent closed manipulation of the left knee and was noted to be in atrial fibrillation in the PACU. This was his first known episode of atrial fibrillation. Prior to the episode he noted 2 weeks of exertional dyspnea. He was relatively asymptomatic and was started on metoprolol and Eliquis. He followed up in atrial fibrillation clinic and was feeling well. TSH was mildly elevated but T4 was within the normal range. He was subsequetly started on low dose levothyroxine.He had an echo 07/02/16 that revealed LVEF 60-65% with grade 1 diastolic dysfunction.  He wore a 30-day event monitor 04/2017 that revealed no atrial fibrillation.  Given that his atrial fibrillation was perioperative Eliquis was discontinued and he was started on aspirin.  Since his last surgery Mr. Rappaport is noted that he has increasing exertional dyspnea.  He started noticing this over the last several months.  He gets exhausted walking to his mailbox and back.  When he gets back in he has to collapse.  He has shortness of breath and sometimes feels a little clammy.  He has no chest pain with it and denies nausea.  He has been unable to exercise for years due to pain in his left leg.  He has swelling in the knee but no lower extremity edema.  He denies orthopnea or PND.  He uses his CPAP regularly and is otherwise well.  He has not noted any episodes of atrial fibrillation recently.  At his last appointment  fenofibrate was added and he has tolerated this well.  Past Medical History:  Diagnosis Date  . 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 Barrett's esophagus with dysplasia   . Chronic lower back pain    DDD  . Closed patellar sleeve fracture of left knee   . Constipation   . Crush injury knee    bilateral  . Dysphagia    EGD with dilitation  . Dyspnea    walking long distance  . Dysrhythmia    thought pt. had a fib after surgery saw cardiology later and ok meds preventive per pt.  . Enlarged prostate    sees a urologist  . Exertional dyspnea 03/21/2019  . GERD (gastroesophageal reflux disease)    takes Omeprazole daily  . Hard of hearing   . Hyperlipidemia    takes Lipitor and Fish Oil daily  . Insomnia    d/t pain  . Joint pain   . Joint swelling   . Left tibial fracture   . Multiple duodenal ulcers   . Nocturia   . OSA on CPAP    cpap  . Pneumonia    "about 5 years ago"  . Type II diabetes mellitus ([place])    type 2  . Unsteady gait   . Urinary frequency   . Wears glasses     Past Surgical History:  Procedure Laterality Date  . ANTERIOR CERVICAL DECOMP/DISCECTOMY  FUSION  10/2010  . ANTERIOR LAT LUMBAR FUSION  12/02/2011   Procedure: ANTERIOR LATERAL LUMBAR FUSION 1 LEVEL;  Surgeon: Eustace Moore, MD;  Location: [facility];  Service: Neurosurgery;  Laterality: Left;  left lumbar three-four  . BACK SURGERY    . CATARACT EXTRACTION, BILATERAL Bilateral    about a month apart.   . COLONOSCOPY  04/2007  . ESOPHAGOGASTRODUODENOSCOPY    . ESOPHAGOGASTRODUODENOSCOPY  04/2007; 07/2008  . ESOPHAGOGASTRODUODENOSCOPY (EGD) WITH ESOPHAGEAL DILATION  "2-3 times"  . EXCISIONAL HEMORRHOIDECTOMY  ~ 1966  . FEMORAL REVISION Left 12/08/2016   Procedure: LEFT KNEE FEMORAL REVISION;  Surgeon: Gaynelle Arabian, MD;  Location: [facility];  Service: Orthopedics;  Laterality: Left;  Adductor Block  . FINGER SURGERY Right    pinky finger  . JOINT  REPLACEMENT    . KNEE ARTHROTOMY Left 07/24/2018   Procedure: Left knee arthrotomy with scar excision;  Surgeon: Gaynelle Arabian, MD;  Location: [facility];  Service: Orthopedics;  Laterality: Left;  . KNEE CLOSED REDUCTION Left 06/21/2016   Procedure: CLOSED MANIPULATION LEFT KNEE;  Surgeon: Gaynelle Arabian, MD;  Location: [facility];  Service: Orthopedics;  Laterality: Left;  . KNEE CLOSED REDUCTION Left 02/28/2017   Procedure: CLOSED MANIPULATION KNEE;  Surgeon: Gaynelle Arabian, MD;  Location: [facility];  Service: Orthopedics;  Laterality: Left;  . KNEE JOINT MANIPULATION  06/2009  . LAPAROSCOPIC CHOLECYSTECTOMY  02/2000  . LUMBAR FUSION  03/16/2012   Dr Ronnald Ramp  . ORIF TIBIA PLATEAU Left 09/24/2015   Procedure: OPEN REDUCTION INTERNAL FIXATION (ORIF) LEFT TIBIAL PLATEAU;  Surgeon: Gaynelle Arabian, MD;  Location: [facility];  Service: Orthopedics;  Laterality: Left;  . PARTIAL KNEE ARTHROPLASTY Left 07/30/2015   Procedure: UNICOMPARTMENTAL LEFT KNEE MEDIAL;  Surgeon: Gaynelle Arabian, MD;  Location: [facility];  Service: Orthopedics;  Laterality: Left;  . POSTERIOR LUMBAR FUSION  04/2015   rods inserted L2  . REFRACTIVE SURGERY Right 1980s  . SHOULDER OPEN ROTATOR CUFF REPAIR Bilateral 1990s  . TENNIS ELBOW RELEASE/NIRSCHEL PROCEDURE Right 1980s  . TOTAL KNEE ARTHROPLASTY Right 03/2009  . TOTAL KNEE ARTHROPLASTY WITH REVISION COMPONENTS Left 03/03/2016   Procedure: REVISION LEFT KNEE UNICOMPARTMENTAL ARTHROPLASTY TO TOTAL KNEE ARTHROPLASTY;  Surgeon: Gaynelle Arabian, MD;  Location: [facility];  Service: Orthopedics;  Laterality: Left;  requests 2hrs; Adductor Block     Current Outpatient Medications  Medication Sig Dispense Refill  . aspirin EC 81 MG tablet Take 81 mg by mouth daily.    [name] Kitchen atorvastatin (LIPITOR) 80 MG tablet Take 40-80 mg by mouth See admin instructions. Takes 80 mg by mouth daily on Monday, Wednesday, Fridays and take 40 mg by mouth on all other days of the week    . cholecalciferol (VITAMIN D3) 25 MCG (1000  UT) tablet Take 1,000 Units by mouth daily.     . Cyanocobalamin (VITAMIN B-12) 5000 MCG TBDP Take 5,000 mcg by mouth daily.    [name] Kitchen docusate sodium (COLACE) 100 MG capsule Take 200 mg by mouth at bedtime.    [name] Kitchen doxycycline (VIBRAMYCIN) 100 MG capsule Take 100 mg by mouth at bedtime.     . fenofibrate (TRICOR) 145 MG tablet Take 1 tablet (145 mg total) by mouth daily. 30 tablet 11  . finasteride (PROSCAR) 5 MG tablet Take 5 mg by mouth every evening.     . gabapentin (NEURONTIN) 300 MG capsule Take 300 mg by mouth 3 (three) times daily.    [name] Kitchen glimepiride (AMARYL) 2 MG tablet Take 2 mg by  mouth daily with breakfast.    . Glucosamine-Chondroitin (GLUCOSAMINE CHONDR COMPLEX PO) Take 1 tablet by mouth daily.    [name] Kitchen levothyroxine (SYNTHROID) 50 MCG tablet Take 50-100 mcg by mouth See admin instructions. Take 100 mcg by mouth daily on Monday, Wednesday and Friday. Take 50 mcg by mouth daily on all other days.    . Methylcellulose, Laxative, (CITRUCEL PO) Take 2 tablets by mouth daily.    . metoprolol succinate (TOPROL-XL) 25 MG 24 hr tablet Take 1 tablet (25 mg total) by mouth 2 (two) times daily. 180 tablet 3  . Multiple Vitamins-Minerals (CENTRUM SILVER PO) Take 1 tablet by mouth daily.     [name] Kitchen omeprazole (PRILOSEC) 20 MG capsule Take 20 mg by mouth daily.    [name] Kitchen oxyCODONE (OXY IR/ROXICODONE) 5 MG immediate release tablet Take 1 tablet (5 mg total) by mouth every 4 (four) hours as needed for moderate pain or severe pain. 60 tablet 0  . pioglitazone-metformin (ACTOPLUS MET) 15-500 MG per tablet Take 1 tablet by mouth daily.     . Saw Palmetto 450 MG CAPS Take 450 mg by mouth daily.    [name] Kitchen Specialty Vitamins Products (BIOTIN PLUS KERATIN PO) Take 1 tablet by mouth at bedtime.    . tamsulosin (FLOMAX) 0.4 MG CAPS capsule Take 0.4 mg by mouth daily.      Current Facility-Administered Medications  Medication Dose Route Frequency Provider Last Rate Last Admin  . incobotulinumtoxinA (XEOMIN) 100 units injection 400  Units  400 Units Intramuscular Q90 days Marcial Pacas, MD   400 Units at 12/15/17 1446    Allergies:   Patient has no known allergies.    Social History:  The patient  reports that he quit smoking about 32 years ago. His smoking use included cigarettes. He has a 22.50 pack-year smoking history. He has never used smokeless tobacco. He reports previous alcohol use. He reports that he does not use drugs.   Family History:  The patient's family history includes Diabetes in his brother; Hodgkin's lymphoma in his brother; Lung cancer in his brother, brother, brother, and father; Stomach cancer in his mother.    ROS:  Please see the history of present illness.   Otherwise, review of systems are positive for none.   All other systems are reviewed and negative.    PHYSICAL EXAM: VS:  BP 124/78 (BP Location: Left Arm, Patient Position: Sitting, Cuff Size: Normal)   Pulse (!) 54   Ht 5' 7"  (1.702 m)   Wt 227 lb (103 kg)   BMI 35.55 kg/m  , BMI Body mass index is 35.55 kg/m. GENERAL:  Well appearing HEENT: Pupils equal round and reactive, fundi not visualized, oral mucosa unremarkable NECK:  No jugular venous distention, waveform within normal limits, carotid upstroke brisk and symmetric, no bruits, no thyromegaly LUNGS:  Clear to auscultation bilaterally HEART:  RRR.  PMI not displaced or sustained,S1 and S2 within normal limits, no S3, no S4, no clicks, no rubs, no murmurs ABD:  Flat, positive bowel sounds normal in frequency in pitch, no bruits, no rebound, no guarding, no midline pulsatile mass, no hepatomegaly, no splenomegaly EXT:  2 plus pulses throughout, no edema, no cyanosis no clubbing.  Unable to fully extend L knee.  SKIN:  No rashes no nodules NEURO:  Cranial nerves II through XII grossly intact, motor grossly intact throughout PSYCH:  Cognitively intact, oriented to person place and time   EKG:  EKG is ordered today. 03/21/2019: Sinus bradycardia.  Rate 54  bpm.  First-degree AV  block.   Echo 07/02/16: Study Conclusions  - Left ventricle: The cavity size was normal. Wall thickness was   increased in a pattern of mild LVH. Systolic function was normal.   The estimated ejection fraction was in the range of 60% to 65%.   Wall motion was normal; there were no regional wall motion   abnormalities. Doppler parameters are consistent with abnormal   left ventricular relaxation (grade 1 diastolic dysfunction). - Aortic valve: There was no stenosis. - Mitral valve: Mildly calcified annulus. There was trivial   regurgitation. - Right ventricle: The cavity size was normal. Systolic function   was normal. - Tricuspid valve: Peak RV-RA gradient (S): 26 mm Hg. - Pulmonary arteries: PA peak pressure: 29 mm Hg (S). - Inferior vena cava: The vessel was normal in size. The   respirophasic diameter changes were in the normal range (>= 50%),   consistent with normal central venous pressure.  Impressions:  - Normal LV size with mild LV hypertrophy, EF 60-65%. Normal RV   size and systolic function. No signficant valvular abnormalities.  30 Day Event Monitor 02/2017: Quality: Fair. Baseline artifact. Predominant rhythm: sinus  Rare PVCs No arrhythmias noted   Recent Labs: 07/25/2018: BUN 16; Creatinine, Ser 0.85; Hemoglobin 12.3; Platelets 165; Potassium 4.2; Sodium 139   03/29/18: Total cholesterol 172, triglycerides  321, HDL 36, LDL 72  01/15/2019: Total cholesterol 136, triglycerides 117, HDL 46  Lipid Panel No results found for: CHOL, TRIG, HDL, CHOLHDL, VLDL, LDLCALC, LDLDIRECT    Wt Readings from Last 3 Encounters:  03/21/19 227 lb (103 kg)  10/19/18 223 lb 5.2 oz (101.3 kg)  10/12/18 223 lb 6.4 oz (101.3 kg)      ASSESSMENT AND PLAN:  # Exertional dyspnea:   Symptoms have been ongoing for the last several months.  Concerning for ischemia given his risk factors of age and hyperlipidemia.  We discussed getting a coronary CT-a and a Lexiscan  Myoview.  Given that he is already on primary prevention medications and the fact that it might take a while to get a coronary CT-a, we will get a Lexiscan Myoview.  We will also stop his amiodarone.  If the above work up is unremarkable, consider stopping beta blocker.   # Paroxysmal atrial fibrillation: Mr. Schlageter had an episode of peri-operative atrial fibrillation. He has never experienced any recurrent symptoms.  30 day event monitor showed no arrhythmias and Eliquis was discontinued.    Stop amiodarone.  Continue aspirin and metoprolol.  We can consider resuming amiodarone for a month if he does have surgery on his knee.  # Hyperlipidemia:  # Hypertriglyceridemia: Continue atorvastatin and fenofibrate.  Triglycerides improved from 321 to 117.   # OSA:  Continue CPAP.  Current medicines are reviewed at length with the patient today.  The patient does not have concerns regarding medicines.  The following changes have been made:  Stop amiodarone  Labs/ tests ordered today include:   Orders Placed This Encounter  Procedures  . MYOCARDIAL PERFUSION IMAGING  . EKG 12-Lead  . ECHOCARDIOGRAM COMPLETE     Disposition:   FU with Jones Viviani C. Oval Linsey, MD, [facility] in 2 months  This note was written with the assistance of speech recognition software.  Please excuse any transcriptional errors.  Signed, Leesa Leifheit C. Oval Linsey, MD, [facility]  03/21/2019 8:31 AM    [place]

## 2019-03-27 ENCOUNTER — Telehealth (HOSPITAL_COMMUNITY): Payer: Self-pay

## 2019-03-27 NOTE — Telephone Encounter (Signed)
Encounter complete. 

## 2019-03-29 ENCOUNTER — Ambulatory Visit (HOSPITAL_COMMUNITY)
Admission: RE | Admit: 2019-03-29 | Payer: Medicare Other | Source: Ambulatory Visit | Attending: Cardiovascular Disease | Admitting: Cardiovascular Disease

## 2019-03-29 ENCOUNTER — Telehealth (HOSPITAL_COMMUNITY): Payer: Self-pay | Admitting: *Deleted

## 2019-03-29 NOTE — Telephone Encounter (Signed)
Spoke with patient.

## 2019-03-30 ENCOUNTER — Inpatient Hospital Stay (HOSPITAL_COMMUNITY): Admission: RE | Admit: 2019-03-30 | Payer: Medicare Other | Source: Ambulatory Visit

## 2019-04-03 ENCOUNTER — Other Ambulatory Visit: Payer: Self-pay

## 2019-04-03 ENCOUNTER — Ambulatory Visit (HOSPITAL_COMMUNITY): Payer: Medicare Other | Attending: Cardiology

## 2019-04-03 DIAGNOSIS — R0609 Other forms of dyspnea: Secondary | ICD-10-CM

## 2019-04-03 DIAGNOSIS — R0600 Dyspnea, unspecified: Secondary | ICD-10-CM

## 2019-04-05 ENCOUNTER — Telehealth (HOSPITAL_COMMUNITY): Payer: Self-pay

## 2019-04-05 NOTE — Telephone Encounter (Signed)
Encounter complete. 

## 2019-04-10 ENCOUNTER — Ambulatory Visit (HOSPITAL_COMMUNITY)
Admission: RE | Admit: 2019-04-10 | Discharge: 2019-04-10 | Disposition: A | Discharge status: Home / Self Care | Payer: Medicare Other | Source: Ambulatory Visit | Attending: Cardiology | Admitting: Cardiology

## 2019-04-10 ENCOUNTER — Other Ambulatory Visit: Payer: Self-pay

## 2019-04-10 DIAGNOSIS — R0600 Dyspnea, unspecified: Secondary | ICD-10-CM | POA: Diagnosis present

## 2019-04-10 DIAGNOSIS — R0609 Other forms of dyspnea: Secondary | ICD-10-CM

## 2019-04-10 LAB — MYOCARDIAL PERFUSION IMAGING
LV dias vol: 101 mL (ref 62–150)
LV sys vol: 41 mL
Peak HR: 71 {beats}/min
Rest HR: 48 {beats}/min
SDS: 2
SRS: 2
SSS: 4
TID: 0.96

## 2019-04-10 MED ORDER — TECHNETIUM TC 99M TETROFOSMIN IV KIT
31.0000 | PACK | Freq: Once | INTRAVENOUS | Status: AC | PRN
  Administered 2019-04-10: 31 via INTRAVENOUS
  Filled 2019-04-10: qty 31

## 2019-04-10 MED ORDER — REGADENOSON 0.4 MG/5ML IV SOLN
0.4000 mg | Freq: Once | INTRAVENOUS | Status: AC
  Administered 2019-04-10: 0.4 mg via INTRAVENOUS

## 2019-04-10 MED ORDER — TECHNETIUM TC 99M TETROFOSMIN IV KIT
10.1000 | PACK | Freq: Once | INTRAVENOUS | Status: AC | PRN
  Administered 2019-04-10: 10.1 via INTRAVENOUS
  Filled 2019-04-10: qty 11

## 2019-04-18 ENCOUNTER — Other Ambulatory Visit: Payer: Self-pay

## 2019-04-18 ENCOUNTER — Encounter: Payer: Self-pay | Admitting: Adult Health

## 2019-04-18 ENCOUNTER — Ambulatory Visit (INDEPENDENT_AMBULATORY_CARE_PROVIDER_SITE_OTHER): Payer: Medicare Other | Admitting: Adult Health

## 2019-04-18 VITALS — BP 111/63 | HR 57 | Temp 97.2°F | Ht 67.0 in | Wt 224.0 lb

## 2019-04-18 DIAGNOSIS — Z9989 Dependence on other enabling machines and devices: Secondary | ICD-10-CM | POA: Diagnosis not present

## 2019-04-18 DIAGNOSIS — G4733 Obstructive sleep apnea (adult) (pediatric): Secondary | ICD-10-CM | POA: Diagnosis not present

## 2019-04-18 NOTE — Progress Notes (Signed)
PATIENT: [name] DOB: Sep 16, 1940  REASON FOR VISIT: follow up HISTORY FROM: patient  HISTORY OF PRESENT ILLNESS: Today 04/18/19:  [name] is a 79 year old male with a history of obstructive sleep apnea on CPAP.  He returns today for follow-up.  His download indicates that he uses machine nightly for compliance of 100%.  He uses machine greater than 4 hours each night.  On average he uses his machine 7 hours and 20 minutes.  His residual AHI is 0.9 on 10 cm of water with EPR of 2.  Leak in the 95th percentile is 9.9 L/min.  Reports that the CPAP is working well for him.  He reports that he is having more fatigue but is under evaluation with his cardiologist.  HISTORY 04-17-2018, I have the pleasure of meeting with [name] today, a 79 year old Caucasian male patient who has followed here for almost 2 years for CPAP care.  [name] medication list was reviewed, he is diabetic, he takes a statin for cholesterol control, Proscar, metoprolol for rate control, and he has been on PRN oxycodone after knee injury and knee surgery.  He is in a lot of pain he reports and he still takes a Roxicodone as prescribed by his orthopedic surgeon and Dr Nelva Bush.  A spasticity treatment with a botox dervivate did not help (Dr. Krista Blue, 2019).  He describes a knife stabbing sharp and burning painful sensation affecting the right lower back, nonradiating.It was during a very painful manipulation of the injured leg that he developed atrial fibrillation and he is now reportedly free of this.   REVIEW OF SYSTEMS: Out of a complete 14 system review of symptoms, the patient complains only of the following symptoms, and all other reviewed systems are negative.  FSS 40 ESS 8  ALLERGIES: No Known Allergies  HOME MEDICATIONS: Outpatient Medications Prior to Visit  Medication Sig Dispense Refill  . aspirin EC 81 MG tablet Take 81 mg by mouth daily.    [name] Kitchen atorvastatin (LIPITOR) 80 MG tablet Take 40-80  mg by mouth See admin instructions. Takes 80 mg by mouth daily on Monday, Wednesday, Fridays and take 40 mg by mouth on all other days of the week    . cholecalciferol (VITAMIN D3) 25 MCG (1000 UT) tablet Take 1,000 Units by mouth daily.     . Cyanocobalamin (VITAMIN B-12) 5000 MCG TBDP Take 5,000 mcg by mouth daily.    [name] Kitchen docusate sodium (COLACE) 100 MG capsule Take 200 mg by mouth at bedtime.    [name] Kitchen doxycycline (VIBRAMYCIN) 100 MG capsule Take 100 mg by mouth at bedtime.     . fenofibrate (TRICOR) 145 MG tablet Take 1 tablet (145 mg total) by mouth daily. 30 tablet 11  . finasteride (PROSCAR) 5 MG tablet Take 5 mg by mouth every evening.     . gabapentin (NEURONTIN) 300 MG capsule Take 300 mg by mouth 3 (three) times daily.    [name] Kitchen glimepiride (AMARYL) 2 MG tablet Take 2 mg by mouth daily with breakfast.    . Glucosamine-Chondroitin (GLUCOSAMINE CHONDR COMPLEX PO) Take 1 tablet by mouth daily.    [name] Kitchen levothyroxine (SYNTHROID) 50 MCG tablet Take 50-100 mcg by mouth See admin instructions. Take 100 mcg by mouth daily on Monday, Wednesday and Friday. Take 50 mcg by mouth daily on all other days.    . Methylcellulose, Laxative, (CITRUCEL PO) Take 2 tablets by mouth daily.    . metoprolol succinate (TOPROL-XL) 25 MG 24 hr tablet  Take 1 tablet (25 mg total) by mouth 2 (two) times daily. 180 tablet 3  . Multiple Vitamins-Minerals (CENTRUM SILVER PO) Take 1 tablet by mouth daily.     [name] Kitchen omeprazole (PRILOSEC) 20 MG capsule Take 20 mg by mouth daily.    [name] Kitchen oxyCODONE (OXY IR/ROXICODONE) 5 MG immediate release tablet Take 1 tablet (5 mg total) by mouth every 4 (four) hours as needed for moderate pain or severe pain. (Patient taking differently: Take 5 mg by mouth daily. ) 60 tablet 0  . pioglitazone-metformin (ACTOPLUS MET) 15-500 MG per tablet Take 1 tablet by mouth daily.     . Saw Palmetto 450 MG CAPS Take 450 mg by mouth daily.    [name] Kitchen Specialty Vitamins Products (BIOTIN PLUS KERATIN PO) Take 1 tablet by mouth at  bedtime.    . tamsulosin (FLOMAX) 0.4 MG CAPS capsule Take 0.4 mg by mouth daily.      Facility-Administered Medications Prior to Visit  Medication Dose Route Frequency Provider Last Rate Last Admin  . incobotulinumtoxinA (XEOMIN) 100 units injection 400 Units  400 Units Intramuscular Q90 days Marcial Pacas, MD   400 Units at 12/15/17 1446    PAST MEDICAL HISTORY: Past Medical History:  Diagnosis Date  . 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 Barrett's esophagus with dysplasia   . Chronic lower back pain    DDD  . Closed patellar sleeve fracture of left knee   . Constipation   . Crush injury knee    bilateral  . Dysphagia    EGD with dilitation  . Dyspnea    walking long distance  . Dysrhythmia    thought pt. had a fib after surgery saw cardiology later and ok meds preventive per pt.  . Enlarged prostate    sees a urologist  . Exertional dyspnea 03/21/2019  . GERD (gastroesophageal reflux disease)    takes Omeprazole daily  . Hard of hearing   . Hyperlipidemia    takes Lipitor and Fish Oil daily  . Insomnia    d/t pain  . Joint pain   . Joint swelling   . Left tibial fracture   . Multiple duodenal ulcers   . Nocturia   . OSA on CPAP    cpap  . Pneumonia    "about 5 years ago"  . Type II diabetes mellitus ([place])    type 2  . Unsteady gait   . Urinary frequency   . Wears glasses     PAST SURGICAL HISTORY: Past Surgical History:  Procedure Laterality Date  . ANTERIOR CERVICAL DECOMP/DISCECTOMY FUSION  10/2010  . ANTERIOR LAT LUMBAR FUSION  12/02/2011   Procedure: ANTERIOR LATERAL LUMBAR FUSION 1 LEVEL;  Surgeon: Eustace Moore, MD;  Location: [facility];  Service: Neurosurgery;  Laterality: Left;  left lumbar three-four  . BACK SURGERY    . CATARACT EXTRACTION, BILATERAL Bilateral    about a month apart.   . COLONOSCOPY  04/2007  . ESOPHAGOGASTRODUODENOSCOPY    . ESOPHAGOGASTRODUODENOSCOPY  04/2007; 07/2008  .  ESOPHAGOGASTRODUODENOSCOPY (EGD) WITH ESOPHAGEAL DILATION  "2-3 times"  . EXCISIONAL HEMORRHOIDECTOMY  ~ 1966  . FEMORAL REVISION Left 12/08/2016   Procedure: LEFT KNEE FEMORAL REVISION;  Surgeon: Gaynelle Arabian, MD;  Location: [facility];  Service: Orthopedics;  Laterality: Left;  Adductor Block  . FINGER SURGERY Right    pinky finger  . JOINT REPLACEMENT    . KNEE ARTHROTOMY Left 07/24/2018  Procedure: Left knee arthrotomy with scar excision;  Surgeon: Gaynelle Arabian, MD;  Location: [facility];  Service: Orthopedics;  Laterality: Left;  . KNEE CLOSED REDUCTION Left 06/21/2016   Procedure: CLOSED MANIPULATION LEFT KNEE;  Surgeon: Gaynelle Arabian, MD;  Location: [facility];  Service: Orthopedics;  Laterality: Left;  . KNEE CLOSED REDUCTION Left 02/28/2017   Procedure: CLOSED MANIPULATION KNEE;  Surgeon: Gaynelle Arabian, MD;  Location: [facility];  Service: Orthopedics;  Laterality: Left;  . KNEE JOINT MANIPULATION  06/2009  . LAPAROSCOPIC CHOLECYSTECTOMY  02/2000  . LUMBAR FUSION  03/16/2012   Dr Ronnald Ramp  . ORIF TIBIA PLATEAU Left 09/24/2015   Procedure: OPEN REDUCTION INTERNAL FIXATION (ORIF) LEFT TIBIAL PLATEAU;  Surgeon: Gaynelle Arabian, MD;  Location: [facility];  Service: Orthopedics;  Laterality: Left;  . PARTIAL KNEE ARTHROPLASTY Left 07/30/2015   Procedure: UNICOMPARTMENTAL LEFT KNEE MEDIAL;  Surgeon: Gaynelle Arabian, MD;  Location: [facility];  Service: Orthopedics;  Laterality: Left;  . POSTERIOR LUMBAR FUSION  04/2015   rods inserted L2  . REFRACTIVE SURGERY Right 1980s  . SHOULDER OPEN ROTATOR CUFF REPAIR Bilateral 1990s  . TENNIS ELBOW RELEASE/NIRSCHEL PROCEDURE Right 1980s  . TOTAL KNEE ARTHROPLASTY Right 03/2009  . TOTAL KNEE ARTHROPLASTY WITH REVISION COMPONENTS Left 03/03/2016   Procedure: REVISION LEFT KNEE UNICOMPARTMENTAL ARTHROPLASTY TO TOTAL KNEE ARTHROPLASTY;  Surgeon: Gaynelle Arabian, MD;  Location: [facility];  Service: Orthopedics;  Laterality: Left;  requests 2hrs; Adductor Block    FAMILY  HISTORY: Family History  Problem Relation Age of Onset  . Diabetes Brother   . Lung cancer Brother   . Stomach cancer Mother   . Lung cancer Father   . Lung cancer Brother   . Hodgkin's lymphoma Brother   . Lung cancer Brother     SOCIAL HISTORY: Social History   Socioeconomic History  . Marital status: Married    Spouse name: Not on file  . Number of children: 6  . Years of education: 96  . Highest education level: Bachelor's degree (e.g., BA, AB, BS)  Occupational History  . Occupation: Part-time office work  Tobacco Use  . Smoking status: Former Smoker    Packs/day: 1.50    Years: 15.00    Pack years: 22.50    Types: Cigarettes    Quit date: 1989    Years since quitting: 32.2  . Smokeless tobacco: Never Used  Substance and Sexual Activity  . Alcohol use: Not Currently    Comment: beer occasionally  . Drug use: No  . Sexual activity: Not Currently  Other Topics Concern  . Not on file  Social History Narrative   Lives at home with his wife.   Right-handed.   3 cups caffeine per day.   Social Determinants of Health   Financial Resource Strain:   . Difficulty of Paying Living Expenses: Not on file  Food Insecurity:   . Worried About Charity fundraiser in the Last Year: Not on file  . Ran Out of Food in the Last Year: Not on file  Transportation Needs:   . Lack of Transportation (Medical): Not on file  . Lack of Transportation (Non-Medical): Not on file  Physical Activity:   . Days of Exercise per Week: Not on file  . Minutes of Exercise per Session: Not on file  Stress:   . Feeling of Stress : Not on file  Social Connections:   . Frequency of Communication with Friends and Family: Not on file  . Frequency of Social  Gatherings with Friends and Family: Not on file  . Attends Religious Services: Not on file  . Active Member of Clubs or Organizations: Not on file  . Attends Archivist Meetings: Not on file  . Marital Status: Not on file   Intimate Partner Violence:   . Fear of Current or Ex-Partner: Not on file  . Emotionally Abused: Not on file  . Physically Abused: Not on file  . Sexually Abused: Not on file      PHYSICAL EXAM  Vitals:   04/18/19 0924  BP: 111/63  Pulse: (!) 57  Temp: (!) 97.2 F (36.2 C)  TempSrc: Oral  Weight: 224 lb (101.6 kg)  Height: 5' 7"  (1.702 m)   Body mass index is 35.08 kg/m.  Generalized: Well developed, in no acute distress  Chest: Lungs clear to auscultation bilaterally  Neurological examination  Mentation: Alert oriented to time, place, history taking. Follows all commands speech and language fluent Cranial nerve II-XII: Extraocular movements were full, visual field were full on confrontational test Head turning and shoulder shrug  were normal and symmetric. Motor: The motor testing reveals 5 over 5 strength of all 4 extremities. Good symmetric motor tone is noted throughout.  Sensory: Sensory testing is intact to soft touch on all 4 extremities. No evidence of extinction is noted.  Gait and station: Gait is normal.    DIAGNOSTIC DATA (LABS, IMAGING, TESTING) - I reviewed patient records, labs, notes, testing and imaging myself where available.  Lab Results  Component Value Date   WBC 9.7 07/25/2018   HGB 12.3 (L) 07/25/2018   HCT 37.8 (L) 07/25/2018   MCV 106.2 (H) 07/25/2018   PLT 165 07/25/2018      Component Value Date/Time   NA 139 07/25/2018 0316   NA 144 09/08/2015 1312   K 4.2 07/25/2018 0316   CL 107 07/25/2018 0316   CO2 22 07/25/2018 0316   GLUCOSE 186 (H) 07/25/2018 0316   BUN 16 07/25/2018 0316   BUN 14 09/08/2015 1312   CREATININE 0.85 07/25/2018 0316   CALCIUM 8.2 (L) 07/25/2018 0316   PROT 6.8 11/30/2016 0843   ALBUMIN 3.9 11/30/2016 0843   AST 24 11/30/2016 0843   ALT 21 11/30/2016 0843   ALKPHOS 55 11/30/2016 0843   BILITOT 1.6 (H) 11/30/2016 0843   GFRNONAA >60 07/25/2018 0316   GFRAA >60 07/25/2018 0316    Lab Results   Component Value Date   HGBA1C 7.6 (H) 07/20/2018    Lab Results  Component Value Date   TSH 4.710 (H) 10/06/2016      ASSESSMENT AND PLAN 79 y.o. year old male  has a past medical history of Arthritis, Barrett's esophagus with dysplasia, Chronic lower back pain, Closed patellar sleeve fracture of left knee, Constipation, Crush injury knee, Dysphagia, Dyspnea, Dysrhythmia, Enlarged prostate, Exertional dyspnea (03/21/2019), GERD (gastroesophageal reflux disease), Hard of hearing, Hyperlipidemia, Insomnia, Joint pain, Joint swelling, Left tibial fracture, Multiple duodenal ulcers, Nocturia, OSA on CPAP, Pneumonia, Type II diabetes mellitus ([place]), Unsteady gait, Urinary frequency, and Wears glasses. here with:  1. OSA on CPAP  - CPAP compliance excellent - Good treatment of AHI  - Encourage patient to use CPAP nightly and > 4 hours each night - F/U in 1 year or sooner if needed   I spent 15 minutes of face-to-face and non-face-to-face time with patient.  This included previsit chart review, lab review, study review, order entry, electronic health record documentation, patient education.  Ward Givens, MSN,  NP-C 04/18/2019, 9:31 AM [facility] [address], [place], [place] [address] [phone]

## 2019-04-18 NOTE — Patient Instructions (Signed)
Continue using CPAP nightly and greater than 4 hours each night °If your symptoms worsen or you develop new symptoms please let us know.  ° °

## 2019-04-23 NOTE — Patient Instructions (Signed)
DUE TO COVID-19 ONLY ONE VISITOR IS ALLOWED TO COME WITH YOU AND STAY IN THE WAITING ROOM ONLY DURING PRE OP AND PROCEDURE DAY OF SURGERY. THE 1 VISITOR MAY VISIT WITH YOU AFTER SURGERY IN YOUR PRIVATE ROOM DURING VISITING HOURS ONLY!  YOU NEED TO HAVE A COVID 19 TEST ON_3/20______ @__2 :45_____, THIS TEST MUST BE DONE BEFORE SURGERY, COME  [address],  [place] , [address].  ([place]) ONCE YOUR COVID TEST IS COMPLETED, PLEASE BEGIN THE QUARANTINE INSTRUCTIONS AS OUTLINED IN YOUR HANDOUT.                [name]    Your procedure is scheduled on: 05/02/19   Report to [facility] Main  Entrance   Report to admitting at   1:45 PM     Call this number if you have problems the morning of surgery [name], NO CHEWING GUM [place].   Do not eat food After Midnight.   YOU MAY HAVE CLEAR LIQUIDS FROM MIDNIGHT UNTIL 12:30 PM     CLEAR LIQUID DIET   Foods Allowed                                                                     Foods Excluded  Coffee and tea, regular and decaf                             liquids that you cannot  Plain Jell-O any favor except red or purple                                           see through such as: Fruit ices (not with fruit pulp)                                     milk, soups, orange juice  Iced Popsicles                                    All solid food Carbonated beverages, regular and diet                                    Cranberry, grape and apple juices Sports drinks like Gatorade Lightly seasoned clear broth or consume(fat free) Sugar, honey syrup  _____________________________________________________________________   . At 12:30 PM Please finish the prescribed Pre-Surgery Gatorade drink.   Nothing by mouth after you finish the Gatorade drink !    Take these medicines the morning of surgery with A SIP OF WATER:  Gabapentin, Metoprolol, Finesteride, Tamsulosin, Levothyroxine, Prilosec  Bring your mask and tubing to the hospital with you  DO NOT TAKE ANY DIABETIC MEDICATIONS DAY OF YOUR SURGERY  How to Manage Your Diabetes Before and After Surgery  Why  is it important to control my blood sugar before and after surgery? . Improving blood sugar levels before and after surgery helps healing and can limit problems. . A way of improving blood sugar control is eating a healthy diet by: o  Eating less sugar and carbohydrates o  Increasing activity/exercise o  Talking with your doctor about reaching your blood sugar goals . High blood sugars (greater than 180 mg/dL) can raise your risk of infections and slow your recovery, so you will need to focus on controlling your diabetes during the weeks before surgery. . Make sure that the doctor who takes care of your diabetes knows about your planned surgery including the date and location.  How do I manage my blood sugar before surgery? . Check your blood sugar at least 4 times a day, starting 2 days before surgery, to make sure that the level is not too high or low. o Check your blood sugar the morning of your surgery when you wake up and every 2 hours until you get to the Short Stay unit. . If your blood sugar is less than 70 mg/dL, you will need to treat for low blood sugar: o Do not take insulin. o Treat a low blood sugar (less than 70 mg/dL) with  cup of clear juice (cranberry or apple), 4 glucose tablets, OR glucose gel. o Recheck blood sugar in 15 minutes after treatment (to make sure it is greater than 70 mg/dL). If your blood sugar is not greater than 70 mg/dL on recheck, call [phone] for further instructions. . Report your blood sugar to the short stay nurse when you get to Short Stay.  . If you are admitted to the hospital after surgery: o Your blood sugar will be checked by the staff and you will probably be given insulin after surgery (instead  of oral diabetes medicines) to make sure you have good blood sugar levels. o The goal for blood sugar control after surgery is 80-180 mg/dL.   WHAT DO I DO ABOUT MY DIABETES MEDICATION?  [name] Kitchen Do not take oral diabetes medicines (pills) the morning of surgery.                              You may not have any metal on your body including              piercings  Do not wear jewelry,  lotions, powders or  deodorant              Men may shave face and neck.   Do not bring valuables to the hospital. [facility] IS NOT             RESPONSIBLE   FOR VALUABLES.  Contacts, dentures or bridgework may not be worn into surgery.      Patients discharged the day of surgery will not be allowed to drive home.   IF YOU ARE HAVING SURGERY AND GOING HOME THE SAME DAY, YOU MUST HAVE AN ADULT TO DRIVE YOU HOME AND BE WITH YOU FOR 24 HOURS.  YOU MAY GO HOME BY TAXI OR UBER OR ORTHERWISE, BUT AN ADULT MUST ACCOMPANY YOU HOME AND STAY WITH YOU FOR 24 HOURS.  Name and phone number of your driver:  Special Instructions: N/A              Please read over the following fact sheets you were given: _____________________________________________________________________  [facility] - Preparing for Surgery  Before surgery, you can play an important role.   Because skin is not sterile, your skin needs to be as free of germs as possible.   You can reduce the number of germs on your skin by washing with CHG (chlorahexidine gluconate) soap before surgery.   CHG is an antiseptic cleaner which kills germs and bonds with the skin to continue killing germs even after washing. Please DO NOT use if you have an allergy to CHG or antibacterial soaps .  If your skin becomes reddened/irritated stop using the CHG and inform your nurse when you arrive at Short Stay.   You may shave your face/neck.  HG showerPlease follow these instructions carefully:  1.  Shower with CHG Soap the night before surgery and the   morning of Surgery.  2.  If you choose to wash your hair, wash your hair first as usual with your  normal  shampoo.  3.  After you shampoo, rinse your hair and body thoroughly to remove the  shampoo.                                       4.  Use CHG as you would any other liquid soap.  You can apply chg directly  to the skin and wash                       Gently with a scrungie or clean washcloth.  5.  Apply the CHG Soap to your body ONLY FROM THE NECK DOWN.   Do not use on face/ open                           Wound or open sores. Avoid contact with eyes, ears mouth and genitals (private parts).                       Wash face,  Genitals (private parts) with your normal soap.             6.  Wash thoroughly, paying special attention to the area where your surgery  will be performed.  7.  Thoroughly rinse your body with warm water from the neck down.  8.  DO NOT shower/wash with your normal soap after using and rinsing off  the CHG Soap.             9.  Pat yourself dry with a clean towel.            10.  Wear clean pajamas.            11.  Place clean sheets on your bed the night of your first shower and do not  sleep with pets. Day of Surgery : Do not apply any lotions/deodorants the morning of surgery.  Please wear clean clothes to the hospital/surgery center.  FAILURE TO FOLLOW THESE INSTRUCTIONS MAY RESULT IN THE CANCELLATION OF YOUR SURGERY PATIENT SIGNATURE_________________________________  NURSE SIGNATURE__________________________________  ________________________________________________________________________   Rogelia Mire  An incentive spirometer is a tool that can help keep your lungs clear and active. This tool measures how well you are filling your lungs with each breath. Taking long deep breaths may help reverse or decrease the chance of developing breathing (pulmonary) problems (especially infection) following:  A long period of time when  you are unable to move or  be active. BEFORE THE PROCEDURE   If the spirometer includes an indicator to show your best effort, your nurse or respiratory therapist will set it to a desired goal.  If possible, sit up straight or lean slightly forward. Try not to slouch.  Hold the incentive spirometer in an upright position. INSTRUCTIONS FOR USE  1. Sit on the edge of your bed if possible, or sit up as far as you can in bed or on a chair. 2. Hold the incentive spirometer in an upright position. 3. Breathe out normally. 4. Place the mouthpiece in your mouth and seal your lips tightly around it. 5. Breathe in slowly and as deeply as possible, raising the piston or the ball toward the top of the column. 6. Hold your breath for 3-5 seconds or for as long as possible. Allow the piston or ball to fall to the bottom of the column. 7. Remove the mouthpiece from your mouth and breathe out normally. 8. Rest for a few seconds and repeat Steps 1 through 7 at least 10 times every 1-2 hours when you are awake. Take your time and take a few normal breaths between deep breaths. 9. The spirometer may include an indicator to show your best effort. Use the indicator as a goal to work toward during each repetition. 10. After each set of 10 deep breaths, practice coughing to be sure your lungs are clear. If you have an incision (the cut made at the time of surgery), support your incision when coughing by placing a pillow or rolled up towels firmly against it. Once you are able to get out of bed, walk around indoors and cough well. You may stop using the incentive spirometer when instructed by your caregiver.  RISKS AND COMPLICATIONS  Take your time so you do not get dizzy or light-headed.  If you are in pain, you may need to take or ask for pain medication before doing incentive spirometry. It is harder to take a deep breath if you are having pain. AFTER USE  Rest and breathe slowly and easily.  It can be helpful to keep track of a log  of your progress. Your caregiver can provide you with a simple table to help with this. If you are using the spirometer at home, follow these instructions: SEEK MEDICAL CARE IF:   You are having difficultly using the spirometer.  You have trouble using the spirometer as often as instructed.  Your pain medication is not giving enough relief while using the spirometer.  You develop fever of 100.5 F (38.1 C) or higher. SEEK IMMEDIATE MEDICAL CARE IF:   You cough up bloody sputum that had not been present before.  You develop fever of 102 F (38.9 C) or greater.  You develop worsening pain at or near the incision site. MAKE SURE YOU:   Understand these instructions.  Will watch your condition.  Will get help right away if you are not doing well or get worse. Document Released: 06/07/2006 Document Revised: 04/19/2011 Document Reviewed: 08/08/2006 [facility] Patient Information 2014 [place], [place].   ________________________________________________________________________

## 2019-04-24 ENCOUNTER — Encounter (HOSPITAL_COMMUNITY): Payer: Self-pay

## 2019-04-24 ENCOUNTER — Encounter (HOSPITAL_COMMUNITY)
Admission: RE | Admit: 2019-04-24 | Discharge: 2019-04-24 | Disposition: A | Discharge status: Home / Self Care | Payer: Medicare Other | Source: Ambulatory Visit | Attending: Orthopedic Surgery | Admitting: Orthopedic Surgery

## 2019-04-24 ENCOUNTER — Other Ambulatory Visit: Payer: Self-pay

## 2019-04-24 ENCOUNTER — Other Ambulatory Visit: Payer: Self-pay | Admitting: Neurological Surgery

## 2019-04-24 DIAGNOSIS — Z7901 Long term (current) use of anticoagulants: Secondary | ICD-10-CM | POA: Insufficient documentation

## 2019-04-24 DIAGNOSIS — I480 Paroxysmal atrial fibrillation: Secondary | ICD-10-CM | POA: Diagnosis not present

## 2019-04-24 DIAGNOSIS — E119 Type 2 diabetes mellitus without complications: Secondary | ICD-10-CM | POA: Diagnosis not present

## 2019-04-24 DIAGNOSIS — Z7982 Long term (current) use of aspirin: Secondary | ICD-10-CM | POA: Diagnosis not present

## 2019-04-24 DIAGNOSIS — E785 Hyperlipidemia, unspecified: Secondary | ICD-10-CM | POA: Insufficient documentation

## 2019-04-24 DIAGNOSIS — K219 Gastro-esophageal reflux disease without esophagitis: Secondary | ICD-10-CM | POA: Diagnosis not present

## 2019-04-24 DIAGNOSIS — Z79899 Other long term (current) drug therapy: Secondary | ICD-10-CM | POA: Insufficient documentation

## 2019-04-24 DIAGNOSIS — Z01812 Encounter for preprocedural laboratory examination: Secondary | ICD-10-CM | POA: Diagnosis present

## 2019-04-24 DIAGNOSIS — M961 Postlaminectomy syndrome, not elsewhere classified: Secondary | ICD-10-CM

## 2019-04-24 DIAGNOSIS — G4733 Obstructive sleep apnea (adult) (pediatric): Secondary | ICD-10-CM | POA: Diagnosis not present

## 2019-04-24 LAB — CBC
HCT: 39.1 % (ref 39.0–52.0)
Hemoglobin: 13 g/dL (ref 13.0–17.0)
MCH: 34.9 pg — ABNORMAL HIGH (ref 26.0–34.0)
MCHC: 33.2 g/dL (ref 30.0–36.0)
MCV: 105.1 fL — ABNORMAL HIGH (ref 80.0–100.0)
Platelets: 213 10*3/uL (ref 150–400)
RBC: 3.72 MIL/uL — ABNORMAL LOW (ref 4.22–5.81)
RDW: 13.2 % (ref 11.5–15.5)
WBC: 5.7 10*3/uL (ref 4.0–10.5)
nRBC: 0 % (ref 0.0–0.2)

## 2019-04-24 LAB — GLUCOSE, CAPILLARY: Glucose-Capillary: 147 mg/dL — ABNORMAL HIGH (ref 70–99)

## 2019-04-24 LAB — BASIC METABOLIC PANEL
Anion gap: 8 (ref 5–15)
BUN: 24 mg/dL — ABNORMAL HIGH (ref 8–23)
CO2: 25 mmol/L (ref 22–32)
Calcium: 9.3 mg/dL (ref 8.9–10.3)
Chloride: 109 mmol/L (ref 98–111)
Creatinine, Ser: 1.23 mg/dL (ref 0.61–1.24)
GFR calc Af Amer: >60 mL/min (ref >60)
GFR calc non Af Amer: 55 mL/min — ABNORMAL LOW (ref >60)
Glucose, Bld: 150 mg/dL — ABNORMAL HIGH (ref 70–99)
Potassium: 4.8 mmol/L (ref 3.5–5.1)
Sodium: 142 mmol/L (ref 135–145)

## 2019-04-24 LAB — HEMOGLOBIN A1C
Hgb A1c MFr Bld: 6.1 % — ABNORMAL HIGH (ref 4.8–5.6)
Mean Plasma Glucose: 128.37 mg/dL

## 2019-04-24 NOTE — Progress Notes (Signed)
PCP - Dr. Pixie Casino Cardiologist -Dr. Lavell Islam   Chest x-ray - 10/12/18 EKG - 03/21/19 Stress Test - 04/10/19 ECHO - 04/03/19 Cardiac Cath - no  Sleep Study - yes CPAP - yes  Fasting Blood Sugar - 125-135 Checks Blood Sugar _____ times a day. 1/week  Blood Thinner Instructions:ASA Aspirin Instructions:none received. Pt knows to stop 5 days prior to DOS because he has had other surgeries. Last Dose:He said he will stop it on 04/27/19  Anesthesia review:   Patient denies shortness of breath, fever, cough and chest pain at PAT appointment yes  Patient verbalized understanding of instructions that were given to them at the PAT appointment. Patient was also instructed that they will need to review over the PAT instructions again at home before surgery. yes

## 2019-04-28 ENCOUNTER — Other Ambulatory Visit (HOSPITAL_COMMUNITY)
Admission: RE | Admit: 2019-04-28 | Discharge: 2019-04-28 | Disposition: A | Discharge status: Home / Self Care | Payer: Medicare Other | Source: Ambulatory Visit | Attending: Orthopedic Surgery | Admitting: Orthopedic Surgery

## 2019-04-28 DIAGNOSIS — Z01812 Encounter for preprocedural laboratory examination: Secondary | ICD-10-CM | POA: Diagnosis present

## 2019-04-28 DIAGNOSIS — Z20822 Contact with and (suspected) exposure to covid-19: Secondary | ICD-10-CM | POA: Diagnosis not present

## 2019-04-28 LAB — SARS CORONAVIRUS 2 (TAT 6-24 HRS): SARS Coronavirus 2: NEGATIVE

## 2019-05-02 ENCOUNTER — Telehealth (HOSPITAL_COMMUNITY): Payer: Self-pay | Admitting: *Deleted

## 2019-05-02 ENCOUNTER — Ambulatory Visit (HOSPITAL_COMMUNITY): Payer: Medicare Other | Admitting: Certified Registered"

## 2019-05-02 ENCOUNTER — Ambulatory Visit (HOSPITAL_COMMUNITY)
Admission: RE | Admit: 2019-05-02 | Discharge: 2019-05-03 | Disposition: A | Discharge status: Home / Self Care | Payer: Medicare Other | Attending: Orthopedic Surgery | Admitting: Orthopedic Surgery

## 2019-05-02 ENCOUNTER — Encounter (HOSPITAL_COMMUNITY): Payer: Self-pay | Admitting: Orthopedic Surgery

## 2019-05-02 ENCOUNTER — Other Ambulatory Visit: Payer: Self-pay

## 2019-05-02 ENCOUNTER — Encounter (HOSPITAL_COMMUNITY)
Admission: RE | Disposition: A | Discharge status: Home / Self Care | Payer: Self-pay | Source: Home / Self Care | Attending: Orthopedic Surgery

## 2019-05-02 DIAGNOSIS — G4733 Obstructive sleep apnea (adult) (pediatric): Secondary | ICD-10-CM | POA: Diagnosis not present

## 2019-05-02 DIAGNOSIS — Z79890 Hormone replacement therapy (postmenopausal): Secondary | ICD-10-CM | POA: Diagnosis not present

## 2019-05-02 DIAGNOSIS — E119 Type 2 diabetes mellitus without complications: Secondary | ICD-10-CM | POA: Diagnosis not present

## 2019-05-02 DIAGNOSIS — M24662 Ankylosis, left knee: Secondary | ICD-10-CM | POA: Insufficient documentation

## 2019-05-02 DIAGNOSIS — Z7984 Long term (current) use of oral hypoglycemic drugs: Secondary | ICD-10-CM | POA: Insufficient documentation

## 2019-05-02 DIAGNOSIS — E785 Hyperlipidemia, unspecified: Secondary | ICD-10-CM | POA: Diagnosis not present

## 2019-05-02 DIAGNOSIS — Z87891 Personal history of nicotine dependence: Secondary | ICD-10-CM | POA: Diagnosis not present

## 2019-05-02 DIAGNOSIS — N400 Enlarged prostate without lower urinary tract symptoms: Secondary | ICD-10-CM | POA: Diagnosis not present

## 2019-05-02 DIAGNOSIS — Z96653 Presence of artificial knee joint, bilateral: Secondary | ICD-10-CM | POA: Diagnosis not present

## 2019-05-02 DIAGNOSIS — M25662 Stiffness of left knee, not elsewhere classified: Secondary | ICD-10-CM | POA: Diagnosis present

## 2019-05-02 DIAGNOSIS — Z7982 Long term (current) use of aspirin: Secondary | ICD-10-CM | POA: Insufficient documentation

## 2019-05-02 DIAGNOSIS — Z96659 Presence of unspecified artificial knee joint: Secondary | ICD-10-CM

## 2019-05-02 DIAGNOSIS — K219 Gastro-esophageal reflux disease without esophagitis: Secondary | ICD-10-CM | POA: Insufficient documentation

## 2019-05-02 DIAGNOSIS — Z96652 Presence of left artificial knee joint: Secondary | ICD-10-CM

## 2019-05-02 DIAGNOSIS — Z79899 Other long term (current) drug therapy: Secondary | ICD-10-CM | POA: Diagnosis not present

## 2019-05-02 HISTORY — PX: KNEE ARTHROTOMY: SHX5881

## 2019-05-02 LAB — GLUCOSE, CAPILLARY
Glucose-Capillary: 136 mg/dL — ABNORMAL HIGH (ref 70–99)
Glucose-Capillary: 108 mg/dL — ABNORMAL HIGH (ref 70–99)

## 2019-05-02 SURGERY — ARTHROTOMY, KNEE
Anesthesia: General | Site: Knee | Laterality: Left

## 2019-05-02 MED ORDER — LACTATED RINGERS IV SOLN: INTRAVENOUS | Status: DC

## 2019-05-02 MED ORDER — PANTOPRAZOLE SODIUM 40 MG PO TBEC: 40.0000 mg | DELAYED_RELEASE_TABLET | Freq: Every day | ORAL | Status: DC

## 2019-05-02 MED ORDER — 0.9 % SODIUM CHLORIDE (POUR BTL) OPTIME
TOPICAL | Status: DC | PRN
  Administered 2019-05-02: 1000 mL

## 2019-05-02 MED ORDER — ONDANSETRON HCL 4 MG PO TABS: 4.0000 mg | ORAL_TABLET | Freq: Four times a day (QID) | ORAL | Status: DC | PRN

## 2019-05-02 MED ORDER — LEVOTHYROXINE SODIUM 50 MCG PO TABS
50.0000 ug | ORAL_TABLET | ORAL | Status: DC
  Administered 2019-05-03: 50 ug via ORAL
  Filled 2019-05-02: qty 1

## 2019-05-02 MED ORDER — FENOFIBRATE 160 MG PO TABS
160.0000 mg | ORAL_TABLET | Freq: Every day | ORAL | Status: DC
  Administered 2019-05-02: 160 mg via ORAL
  Filled 2019-05-02: qty 1

## 2019-05-02 MED ORDER — POVIDONE-IODINE 10 % EX SWAB
2.0000 "application " | Freq: Once | CUTANEOUS | Status: AC
  Administered 2019-05-02: 2 via TOPICAL

## 2019-05-02 MED ORDER — CEFAZOLIN SODIUM-DEXTROSE 2-4 GM/100ML-% IV SOLN
2.0000 g | Freq: Four times a day (QID) | INTRAVENOUS | Status: AC
  Administered 2019-05-02 – 2019-05-03 (×2): 2 g via INTRAVENOUS
  Filled 2019-05-02 (×2): qty 100

## 2019-05-02 MED ORDER — DEXAMETHASONE SODIUM PHOSPHATE 10 MG/ML IJ SOLN
INTRAMUSCULAR | Status: AC
  Filled 2019-05-02: qty 1

## 2019-05-02 MED ORDER — ACETAMINOPHEN 10 MG/ML IV SOLN
1000.0000 mg | Freq: Four times a day (QID) | INTRAVENOUS | Status: DC
  Administered 2019-05-02: 1000 mg via INTRAVENOUS
  Filled 2019-05-02: qty 100

## 2019-05-02 MED ORDER — ONDANSETRON HCL 4 MG/2ML IJ SOLN
INTRAMUSCULAR | Status: AC
  Filled 2019-05-02: qty 2

## 2019-05-02 MED ORDER — SODIUM CHLORIDE 0.9 % IR SOLN
Status: DC | PRN
  Administered 2019-05-02: 1000 mL

## 2019-05-02 MED ORDER — LEVOTHYROXINE SODIUM 50 MCG PO TABS: 100.0000 ug | ORAL_TABLET | ORAL | Status: DC

## 2019-05-02 MED ORDER — RIVAROXABAN 10 MG PO TABS
10.0000 mg | ORAL_TABLET | Freq: Every day | ORAL | Status: DC
  Administered 2019-05-03: 09:00:00 10 mg via ORAL
  Filled 2019-05-02: qty 1

## 2019-05-02 MED ORDER — METOPROLOL SUCCINATE ER 25 MG PO TB24
25.0000 mg | ORAL_TABLET | Freq: Two times a day (BID) | ORAL | Status: DC
  Administered 2019-05-02: 25 mg via ORAL
  Filled 2019-05-02: qty 1

## 2019-05-02 MED ORDER — BUPIVACAINE HCL (PF) 0.25 % IJ SOLN
INTRAMUSCULAR | Status: AC
  Filled 2019-05-02: qty 30

## 2019-05-02 MED ORDER — MORPHINE SULFATE (PF) 2 MG/ML IV SOLN: 0.5000 mg | INTRAVENOUS | Status: DC | PRN

## 2019-05-02 MED ORDER — GABAPENTIN 300 MG PO CAPS
300.0000 mg | ORAL_CAPSULE | Freq: Two times a day (BID) | ORAL | Status: DC
  Administered 2019-05-02: 300 mg via ORAL
  Filled 2019-05-02: qty 1

## 2019-05-02 MED ORDER — TAMSULOSIN HCL 0.4 MG PO CAPS: 0.4000 mg | ORAL_CAPSULE | Freq: Every day | ORAL | Status: DC

## 2019-05-02 MED ORDER — FENTANYL CITRATE (PF) 100 MCG/2ML IJ SOLN
INTRAMUSCULAR | Status: AC
  Filled 2019-05-02: qty 2

## 2019-05-02 MED ORDER — DOCUSATE SODIUM 100 MG PO CAPS
100.0000 mg | ORAL_CAPSULE | Freq: Two times a day (BID) | ORAL | Status: DC
  Administered 2019-05-02: 100 mg via ORAL
  Filled 2019-05-02: qty 1

## 2019-05-02 MED ORDER — CEFAZOLIN SODIUM-DEXTROSE 2-4 GM/100ML-% IV SOLN
2.0000 g | INTRAVENOUS | Status: AC
  Administered 2019-05-02: 2 g via INTRAVENOUS
  Filled 2019-05-02: qty 100

## 2019-05-02 MED ORDER — CHLORHEXIDINE GLUCONATE 4 % EX LIQD: 60.0000 mL | Freq: Once | CUTANEOUS | Status: DC

## 2019-05-02 MED ORDER — SODIUM CHLORIDE 0.9 % IV SOLN: INTRAVENOUS | Status: DC

## 2019-05-02 MED ORDER — ONDANSETRON HCL 4 MG/2ML IJ SOLN
INTRAMUSCULAR | Status: DC | PRN
  Administered 2019-05-02: 4 mg via INTRAVENOUS

## 2019-05-02 MED ORDER — BUPIVACAINE HCL 0.25 % IJ SOLN
INTRAMUSCULAR | Status: DC | PRN
  Administered 2019-05-02: 30 mL

## 2019-05-02 MED ORDER — BISACODYL 10 MG RE SUPP: 10.0000 mg | Freq: Every day | RECTAL | Status: DC | PRN

## 2019-05-02 MED ORDER — FLEET ENEMA 7-19 GM/118ML RE ENEM: 1.0000 | ENEMA | Freq: Once | RECTAL | Status: DC | PRN

## 2019-05-02 MED ORDER — METHOCARBAMOL 500 MG IVPB - SIMPLE MED
INTRAVENOUS | Status: AC
  Filled 2019-05-02: qty 50

## 2019-05-02 MED ORDER — ATORVASTATIN CALCIUM 40 MG PO TABS: 40.0000 mg | ORAL_TABLET | ORAL | Status: DC

## 2019-05-02 MED ORDER — DEXAMETHASONE SODIUM PHOSPHATE 10 MG/ML IJ SOLN: 10.0000 mg | Freq: Once | INTRAMUSCULAR | Status: DC

## 2019-05-02 MED ORDER — METHOCARBAMOL 500 MG IVPB - SIMPLE MED
500.0000 mg | Freq: Four times a day (QID) | INTRAVENOUS | Status: DC | PRN
  Administered 2019-05-02: 500 mg via INTRAVENOUS
  Filled 2019-05-02: qty 50

## 2019-05-02 MED ORDER — EPHEDRINE SULFATE-NACL 50-0.9 MG/10ML-% IV SOSY
PREFILLED_SYRINGE | INTRAVENOUS | Status: DC | PRN
  Administered 2019-05-02 (×2): 15 mg via INTRAVENOUS

## 2019-05-02 MED ORDER — DOXYCYCLINE HYCLATE 100 MG PO TABS
100.0000 mg | ORAL_TABLET | Freq: Every day | ORAL | Status: DC
  Administered 2019-05-02: 100 mg via ORAL
  Filled 2019-05-02: qty 1

## 2019-05-02 MED ORDER — MENTHOL 3 MG MT LOZG: 1.0000 | LOZENGE | OROMUCOSAL | Status: DC | PRN

## 2019-05-02 MED ORDER — TRAMADOL HCL 50 MG PO TABS: 50.0000 mg | ORAL_TABLET | Freq: Four times a day (QID) | ORAL | Status: DC | PRN

## 2019-05-02 MED ORDER — PHENYLEPHRINE 40 MCG/ML (10ML) SYRINGE FOR IV PUSH (FOR BLOOD PRESSURE SUPPORT)
PREFILLED_SYRINGE | INTRAVENOUS | Status: DC | PRN
  Administered 2019-05-02: 120 ug via INTRAVENOUS
  Administered 2019-05-02: 80 ug via INTRAVENOUS

## 2019-05-02 MED ORDER — METOCLOPRAMIDE HCL 5 MG/ML IJ SOLN: 5.0000 mg | Freq: Three times a day (TID) | INTRAMUSCULAR | Status: DC | PRN

## 2019-05-02 MED ORDER — HYDROMORPHONE HCL 1 MG/ML IJ SOLN
0.2500 mg | INTRAMUSCULAR | Status: DC | PRN
  Administered 2019-05-02 (×4): 0.5 mg via INTRAVENOUS

## 2019-05-02 MED ORDER — FINASTERIDE 5 MG PO TABS: 5.0000 mg | ORAL_TABLET | Freq: Every evening | ORAL | Status: DC

## 2019-05-02 MED ORDER — METOCLOPRAMIDE HCL 5 MG PO TABS: 5.0000 mg | ORAL_TABLET | Freq: Three times a day (TID) | ORAL | Status: DC | PRN

## 2019-05-02 MED ORDER — DEXAMETHASONE SODIUM PHOSPHATE 10 MG/ML IJ SOLN
8.0000 mg | Freq: Once | INTRAMUSCULAR | Status: AC
  Administered 2019-05-02: 8 mg via INTRAVENOUS

## 2019-05-02 MED ORDER — ATORVASTATIN CALCIUM 40 MG PO TABS
80.0000 mg | ORAL_TABLET | ORAL | Status: DC
  Administered 2019-05-02: 80 mg via ORAL
  Filled 2019-05-02: qty 2

## 2019-05-02 MED ORDER — POLYETHYLENE GLYCOL 3350 17 G PO PACK: 17.0000 g | PACK | Freq: Every day | ORAL | Status: DC | PRN

## 2019-05-02 MED ORDER — HYDROMORPHONE HCL 1 MG/ML IJ SOLN
INTRAMUSCULAR | Status: AC
  Filled 2019-05-02: qty 2

## 2019-05-02 MED ORDER — DIPHENHYDRAMINE HCL 12.5 MG/5ML PO ELIX: 12.5000 mg | ORAL_SOLUTION | ORAL | Status: DC | PRN

## 2019-05-02 MED ORDER — GLIMEPIRIDE 2 MG PO TABS
2.0000 mg | ORAL_TABLET | Freq: Every day | ORAL | Status: DC
  Filled 2019-05-02: qty 1

## 2019-05-02 MED ORDER — PROPOFOL 10 MG/ML IV BOLUS
INTRAVENOUS | Status: DC | PRN
  Administered 2019-05-02: 60 mg via INTRAVENOUS
  Administered 2019-05-02: 110 mg via INTRAVENOUS

## 2019-05-02 MED ORDER — ACETAMINOPHEN 500 MG PO TABS
1000.0000 mg | ORAL_TABLET | Freq: Four times a day (QID) | ORAL | Status: DC
  Administered 2019-05-02 – 2019-05-03 (×3): 1000 mg via ORAL
  Filled 2019-05-02 (×3): qty 2

## 2019-05-02 MED ORDER — OXYCODONE HCL 5 MG PO TABS
5.0000 mg | ORAL_TABLET | ORAL | Status: DC | PRN
  Administered 2019-05-02 – 2019-05-03 (×4): 10 mg via ORAL
  Filled 2019-05-02 (×4): qty 2

## 2019-05-02 MED ORDER — METHOCARBAMOL 500 MG PO TABS
500.0000 mg | ORAL_TABLET | Freq: Four times a day (QID) | ORAL | Status: DC | PRN
  Administered 2019-05-02: 21:00:00 500 mg via ORAL
  Filled 2019-05-02: qty 1

## 2019-05-02 MED ORDER — PHENOL 1.4 % MT LIQD: 1.0000 | OROMUCOSAL | Status: DC | PRN

## 2019-05-02 MED ORDER — ONDANSETRON HCL 4 MG/2ML IJ SOLN: 4.0000 mg | Freq: Four times a day (QID) | INTRAMUSCULAR | Status: DC | PRN

## 2019-05-02 MED ORDER — FENTANYL CITRATE (PF) 100 MCG/2ML IJ SOLN
INTRAMUSCULAR | Status: DC | PRN
  Administered 2019-05-02: 25 ug via INTRAVENOUS
  Administered 2019-05-02 (×3): 50 ug via INTRAVENOUS
  Administered 2019-05-02: 25 ug via INTRAVENOUS

## 2019-05-02 MED ORDER — LIDOCAINE 2% (20 MG/ML) 5 ML SYRINGE
INTRAMUSCULAR | Status: DC | PRN
  Administered 2019-05-02: 100 mg via INTRAVENOUS

## 2019-05-02 SURGICAL SUPPLY — 38 items
BAG ZIPLOCK 12X15 (MISCELLANEOUS) ×3 IMPLANT
BNDG ELASTIC 6X5.8 VLCR STR LF (GAUZE/BANDAGES/DRESSINGS) ×3 IMPLANT
CLOSURE WOUND 1/2 X4 (GAUZE/BANDAGES/DRESSINGS) ×2
COVER SURGICAL LIGHT HANDLE (MISCELLANEOUS) ×3 IMPLANT
COVER WAND RF STERILE (DRAPES) IMPLANT
CUFF TOURN SGL QUICK 34 (TOURNIQUET CUFF)
CUFF TRNQT CYL 34X4.125X (TOURNIQUET CUFF) IMPLANT
DRAPE U-SHAPE 47X51 STRL (DRAPES) ×3 IMPLANT
DRSG ADAPTIC 3X8 NADH LF (GAUZE/BANDAGES/DRESSINGS) ×3 IMPLANT
DRSG AQUACEL AG ADV 3.5X10 (GAUZE/BANDAGES/DRESSINGS) ×2 IMPLANT
DRSG PAD ABDOMINAL 8X10 ST (GAUZE/BANDAGES/DRESSINGS) ×6 IMPLANT
DURAPREP 26ML APPLICATOR (WOUND CARE) ×3 IMPLANT
ELECT REM PT RETURN 15FT ADLT (MISCELLANEOUS) ×3 IMPLANT
EVACUATOR 1/8 PVC DRAIN (DRAIN) IMPLANT
GAUZE SPONGE 4X4 12PLY STRL (GAUZE/BANDAGES/DRESSINGS) ×3 IMPLANT
GLOVE BIO SURGEON STRL SZ7 (GLOVE) ×3 IMPLANT
GLOVE BIO SURGEON STRL SZ8 (GLOVE) ×3 IMPLANT
GLOVE BIOGEL PI IND STRL 7.0 (GLOVE) ×1 IMPLANT
GLOVE BIOGEL PI IND STRL 8 (GLOVE) ×3 IMPLANT
GLOVE BIOGEL PI INDICATOR 7.0 (GLOVE) ×2
GLOVE BIOGEL PI INDICATOR 8 (GLOVE) ×6
GOWN STRL REUS W/TWL LRG LVL3 (GOWN DISPOSABLE) ×9 IMPLANT
KIT TURNOVER KIT A (KITS) IMPLANT
MANIFOLD NEPTUNE II (INSTRUMENTS) ×3 IMPLANT
PACK TOTAL KNEE CUSTOM (KITS) ×3 IMPLANT
PADDING CAST COTTON 6X4 STRL (CAST SUPPLIES) ×9 IMPLANT
PENCIL SMOKE EVACUATOR (MISCELLANEOUS) IMPLANT
PROTECTOR NERVE ULNAR (MISCELLANEOUS) ×3 IMPLANT
SET INTERPULSE LAVAGE W/TIP (ORTHOPEDIC DISPOSABLE SUPPLIES) ×2 IMPLANT
STAPLER VISISTAT 35W (STAPLE) ×2 IMPLANT
STRIP CLOSURE SKIN 1/2X4 (GAUZE/BANDAGES/DRESSINGS) ×4 IMPLANT
SUT ETHIBOND NAB CT1 #1 30IN (SUTURE) ×2 IMPLANT
SUT MNCRL AB 4-0 PS2 18 (SUTURE) ×3 IMPLANT
SUT STRATAFIX 0 PDS 27 VIOLET (SUTURE) ×3
SUT VIC AB 2-0 CT1 27 (SUTURE) ×9
SUT VIC AB 2-0 CT1 TAPERPNT 27 (SUTURE) ×2 IMPLANT
SUTURE STRATFX 0 PDS 27 VIOLET (SUTURE) ×1 IMPLANT
WRAP KNEE MAXI GEL POST OP (GAUZE/BANDAGES/DRESSINGS) ×2 IMPLANT

## 2019-05-02 NOTE — Anesthesia Procedure Notes (Signed)
Procedure Name: LMA Insertion Date/Time: 05/02/2019 2:32 PM Performed by: Wynonia Sours, CRNA Pre-anesthesia Checklist: Patient identified, Emergency Drugs available, Suction available, Patient being monitored and Timeout performed Patient Re-evaluated:Patient Re-evaluated prior to induction Oxygen Delivery Method: Circle system utilized Preoxygenation: Pre-oxygenation with 100% oxygen Induction Type: IV induction LMA: LMA with gastric port inserted LMA Size: 4.0 Number of attempts: 2 Placement Confirmation: positive ETCO2 and breath sounds checked- equal and bilateral Tube secured with: Tape Dental Injury: Teeth and Oropharynx as per pre-operative assessment

## 2019-05-02 NOTE — Transfer of Care (Signed)
Immediate Anesthesia Transfer of Care Note  Patient: [name]  Procedure(s) Performed: Left knee arthrotomy; scar excision (Left Knee)  Patient Location: PACU  Anesthesia Type:General  Level of Consciousness: awake, alert  and oriented  Airway & Oxygen Therapy: Patient Spontanous Breathing and Patient connected to face mask oxygen  Post-op Assessment: Report given to RN and Post -op Vital signs reviewed and stable  Post vital signs: Reviewed and stable  Last Vitals:  Vitals Value Taken Time  BP    Temp 36.4 C 05/02/19 1548  Pulse 85 05/02/19 1551  Resp 16 05/02/19 1551  SpO2 94 % 05/02/19 1551  Vitals shown include unvalidated device data.  Last Pain:  Vitals:   05/02/19 1223  TempSrc:   PainSc: 0-No pain         Complications: No apparent anesthesia complications

## 2019-05-02 NOTE — Discharge Instructions (Addendum)
[name] Gross, MD [facility] [place] [address], [address] [place], [place] [address] [phone]  POSTOPERATIVE DIRECTIONS   BLOOD CLOT PREVENTION . Take a 10 mg Xarelto once a day for three weeks following surgery. Then resume one 81 mg aspirin once a day. . You may resume your vitamins/supplements upon once you have discontinued the Xarelto. . Do not take any NSAIDs (Advil, Aleve, Ibuprofen, Meloxicam, etc.) until you have discontinued the Xarelto.   HOME CARE INSTRUCTIONS  . Remove items at home which could result in a fall. This includes throw rugs or furniture in walking pathways.  . ICE to the affected knee as much as tolerated. Icing helps control swelling. If the swelling is well controlled you will be more comfortable and rehab easier. Continue to use ice on the knee for pain and swelling from surgery. You may notice swelling that will progress down to the foot and ankle. This is normal after surgery. Elevate the leg when you are not up walking on it.    . Continue to use the breathing machine which will help keep your temperature down. It is common for your temperature to cycle up and down following surgery, especially at night when you are not up moving around and exerting yourself. The breathing machine keeps your lungs expanded and your temperature down. . Do not place pillow under the operative knee, focus on keeping the knee straight while resting  DIET You may resume your previous home diet once you are discharged from the hospital.  DRESSING / WOUND CARE / SHOWERING . Keep your bulky bandage on for 2 days. On the third post-operative day you may remove the Ace bandage and gauze. There is a waterproof adhesive bandage on your skin which will stay in place until your first follow-up appointment. Once you remove this you will not need to place another bandage . You may begin showering 3 days following surgery, but do not submerge the  incision under water.  ACTIVITY For the first 5 days, the key is rest and control of pain and swelling . Do your home exercises twice a day starting on post-operative day 3. On the days you go to physical therapy, just do the home exercises once that day. . You should rest, ice and elevate the leg for 50 minutes out of every hour. Get up and walk/stretch for 10 minutes per hour. After 5 days you can increase your activity slowly as tolerated. . Walk with your walker as instructed. Use the walker until you are comfortable transitioning to a cane. Walk with the cane in the opposite hand of the operative leg. You may discontinue the cane once you are comfortable and walking steadily. . Avoid periods of inactivity such as sitting longer than an hour when not asleep. This helps prevent blood clots.  . You may discontinue the knee immobilizer once you are able to perform a straight leg raise while lying down. . You may resume a sexual relationship in one month or when given the OK by your doctor.  . You may return to work once you are cleared by your doctor.  . Do not drive a car for 6 weeks or until released by your surgeon.  . Do not drive while taking narcotics.  TED HOSE STOCKINGS Wear the elastic stockings on both legs for three weeks following surgery during the day. You may remove them at night for sleeping.  WEIGHT BEARING Weight bearing as tolerated with assist  device (walker, cane, etc) as directed, use it as long as suggested by your surgeon or therapist, typically at least 4-6 weeks.  POSTOPERATIVE CONSTIPATION PROTOCOL Constipation - defined medically as fewer than three stools per week and severe constipation as less than one stool per week.  One of the most common issues patients have following surgery is constipation.  Even if you have a regular bowel pattern at home, your normal regimen is likely to be disrupted due to multiple reasons following surgery.  Combination of anesthesia,  postoperative narcotics, change in appetite and fluid intake all can affect your bowels.  In order to avoid complications following surgery, here are some recommendations in order to help you during your recovery period.  . Colace (docusate) - Pick up an over-the-counter form of Colace or another stool softener and take twice a day as long as you are requiring postoperative pain medications.  Take with a full glass of water daily.  If you experience loose stools or diarrhea, hold the colace until you stool forms back up. If your symptoms do not get better within 1 week or if they get worse, check with your doctor. . Dulcolax (bisacodyl) - Pick up over-the-counter and take as directed by the product packaging as needed to assist with the movement of your bowels.  Take with a full glass of water.  Use this product as needed if not relieved by Colace only.  . MiraLax (polyethylene glycol) - Pick up over-the-counter to have on hand. MiraLax is a solution that will increase the amount of water in your bowels to assist with bowel movements.  Take as directed and can mix with a glass of water, juice, soda, coffee, or tea. Take if you go more than two days without a movement. Do not use MiraLax more than once per day. Call your doctor if you are still constipated or irregular after using this medication for 7 days in a row.  If you continue to have problems with postoperative constipation, please contact the office for further assistance and recommendations.  If you experience "the worst abdominal pain ever" or develop nausea or vomiting, please contact the office immediatly for further recommendations for treatment.  ITCHING If you experience itching with your medications, try taking only a single pain pill, or even half a pain pill at a time.  You can also use Benadryl over the counter for itching or also to help with sleep.   MEDICATIONS See your medication summary on the "After Visit Summary" that the  nursing staff will review with you prior to discharge.  You may have some home medications which will be placed on hold until you complete the course of blood thinner medication.  It is important for you to complete the blood thinner medication as prescribed by your surgeon.  Continue your approved medications as instructed at time of discharge.  PRECAUTIONS . If you experience chest pain or shortness of breath - call 911 immediately for transfer to the hospital emergency department.  . If you develop a fever greater that 101 F, purulent drainage from wound, increased redness or drainage from wound, foul odor from the wound/dressing, or calf pain - CONTACT YOUR SURGEON.                                                   FOLLOW-UP APPOINTMENTS  Make sure you keep all of your appointments after your operation with your surgeon and caregivers. You should call the office at the above phone number and make an appointment for approximately two weeks after the date of your surgery or on the date instructed by your surgeon outlined in the "After Visit Summary".  MAKE SURE YOU:  . Understand these instructions.  . Get help right away if you are not doing well or get worse.    Pick up stool softner and laxative for home use following surgery while on pain medications. Do not submerge incision under water. Please use good hand washing techniques while changing dressing each day. May shower starting three days after surgery. Please use a clean towel to pat the incision dry following showers. Continue to use ice for pain and swelling after surgery. Do not use any lotions or creams on the incision until instructed by your surgeon.   Information on my medicine - XARELTO (Rivaroxaban)  Why was Xarelto prescribed for you? Xarelto was prescribed for you to reduce the risk of blood clots forming after orthopedic surgery. The medical term for these abnormal blood clots is venous thromboembolism (VTE).  What  do you need to know about xarelto ? Take your Xarelto ONCE DAILY at the same time every day. You may take it either with or without food.  If you have difficulty swallowing the tablet whole, you may crush it and mix in applesauce just prior to taking your dose.  Take Xarelto exactly as prescribed by your doctor and DO NOT stop taking Xarelto without talking to the doctor who prescribed the medication.  Stopping without other VTE prevention medication to take the place of Xarelto may increase your risk of developing a clot.  After discharge, you should have regular check-up appointments with your healthcare provider that is prescribing your Xarelto.    What do you do if you miss a dose? If you miss a dose, take it as soon as you remember on the same day then continue your regularly scheduled once daily regimen the next day. Do not take two doses of Xarelto on the same day.   Important Safety Information A possible side effect of Xarelto is bleeding. You should call your healthcare provider right away if you experience any of the following: ? Bleeding from an injury or your nose that does not stop. ? Unusual colored urine (red or dark brown) or unusual colored stools (red or black). ? Unusual bruising for unknown reasons. ? A serious fall or if you hit your head (even if there is no bleeding).  Some medicines may interact with Xarelto and might increase your risk of bleeding while on Xarelto. To help avoid this, consult your healthcare provider or pharmacist prior to using any new prescription or non-prescription medications, including herbals, vitamins, non-steroidal anti-inflammatory drugs (NSAIDs) and supplements.  This website has more information on Xarelto: [email or web address]

## 2019-05-02 NOTE — Anesthesia Postprocedure Evaluation (Signed)
Anesthesia Post Note  Patient: [name]  Procedure(s) Performed: Left knee arthrotomy; scar excision (Left Knee)     Patient location during evaluation: PACU Anesthesia Type: General Level of consciousness: awake Pain management: pain level controlled Vital Signs Assessment: post-procedure vital signs reviewed and stable Respiratory status: spontaneous breathing Cardiovascular status: stable Postop Assessment: no apparent nausea or vomiting Anesthetic complications: no    Last Vitals:  Vitals:   05/02/19 2017 05/02/19 2147  BP: 114/67 120/63  Pulse: 75 79  Resp:  17  Temp: 36.7 C 36.8 C  SpO2: 98% 96%    Last Pain:  Vitals:   05/02/19 2300  TempSrc:   PainSc: Asleep                 Glennis Montenegro

## 2019-05-02 NOTE — Anesthesia Preprocedure Evaluation (Signed)
Anesthesia Evaluation  Patient identified by MRN, date of birth, ID band Patient awake    Reviewed: Allergy & Precautions, NPO status , Patient's Chart, lab work & pertinent test results  Airway Mallampati: II  TM Distance: >3 FB     Dental   Pulmonary pneumonia, former smoker,    breath sounds clear to auscultation       Cardiovascular + dysrhythmias  Rhythm:Regular Rate:Normal     Neuro/Psych    GI/Hepatic Neg liver ROS, GERD  ,  Endo/Other  diabetes  Renal/GU negative Renal ROS     Musculoskeletal   Abdominal   Peds  Hematology   Anesthesia Other Findings   Reproductive/Obstetrics                             Anesthesia Physical Anesthesia Plan  ASA: III  Anesthesia Plan: General   Post-op Pain Management:    Induction: Intravenous  PONV Risk Score and Plan: 2 and Ondansetron and Dexamethasone  Airway Management Planned: LMA  Additional Equipment:   Intra-op Plan:   Post-operative Plan:   Informed Consent: I have reviewed the patients History and Physical, chart, labs and discussed the procedure including the risks, benefits and alternatives for the proposed anesthesia with the patient or authorized representative who has indicated his/her understanding and acceptance.     History available from chart only  Plan Discussed with: CRNA and Anesthesiologist  Anesthesia Plan Comments:         Anesthesia Quick Evaluation

## 2019-05-02 NOTE — Plan of Care (Signed)
  Problem: Education: Goal: Knowledge of the prescribed therapeutic regimen will improve Outcome: Progressing   Problem: Nutrition: Goal: Adequate nutrition will be maintained Outcome: Progressing   

## 2019-05-02 NOTE — Brief Op Note (Signed)
05/02/2019  3:18 PM  PATIENT:  [name]  79 y.o. male  PRE-OPERATIVE DIAGNOSIS:  Left knee arthrofibrosis  POST-OPERATIVE DIAGNOSIS:  Left knee arthrofibrosis  PROCEDURE:  Procedure(s) with comments: Left knee arthrotomy; scar excision (Left) -  SURGEON:  Surgeon(s) and Role:    Ollen Gross, MD - Primary  PHYSICIAN ASSISTANT:   ASSISTANTS: Dennie Bible, PA-C   ANESTHESIA:   general  EBL:  25 ml  BLOOD ADMINISTERED:none  DRAINS: none   LOCAL MEDICATIONS USED:  MARCAINE     COUNTS:  YES  TOURNIQUET:   Total Tourniquet Time Documented: Thigh (Left) - 22 minutes Total: Thigh (Left) - 22 minutes   DICTATION: .Other Dictation: Dictation Number [id number]  PLAN OF CARE: Admit for overnight observation  PATIENT DISPOSITION:  PACU - hemodynamically stable.

## 2019-05-02 NOTE — H&P (Signed)
CC- [name] is a 79 y.o. male who presents with left knee stiffness.  HPI- . Knee Pain: Patient presents stiffness involving the  left knee. Onset of the symptoms was several months ago. Inciting event: He has a long complex history with this left knee. He most recently had revision surgery and was progressing fairly well until he had a major lumbar fusion and was not able to exercise on the left knee for several months. He developed significant scar tissue preventing boh flexion and extension. He had a course of physical therapy which did not help at all. He has very limited motion of the knee and correspondingly has developed pain. He presents now for arthrotomy and scar excision.   Past Medical History:  Diagnosis Date  . 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 Barrett's esophagus with dysplasia   . Chronic lower back pain    DDD  . Closed patellar sleeve fracture of left knee   . Constipation   . Crush injury knee    bilateral  . Dysphagia    EGD with dilitation  . Dyspnea    walking long distance  . Dysrhythmia    thought pt. had a fib after surgery saw cardiology later and ok meds preventive per pt.  . Enlarged prostate    sees a urologist  . Exertional dyspnea 03/21/2019  . GERD (gastroesophageal reflux disease)    takes Omeprazole daily  . Hard of hearing   . Hyperlipidemia    takes Lipitor and Fish Oil daily  . Insomnia    d/t pain  . Joint pain   . Joint swelling   . Left tibial fracture   . Multiple duodenal ulcers   . Nocturia   . OSA on CPAP    cpap  . Pneumonia 2001  . Type II diabetes mellitus ([place])    type 2  . Unsteady gait   . Urinary frequency   . Wears glasses     Past Surgical History:  Procedure Laterality Date  . ANTERIOR CERVICAL DECOMP/DISCECTOMY FUSION  10/2010  . ANTERIOR LAT LUMBAR FUSION  12/02/2011   Procedure: ANTERIOR LATERAL LUMBAR FUSION 1 LEVEL;  Surgeon: Eustace Moore, MD;  Location: [facility];  Service: Neurosurgery;  Laterality: Left;  left lumbar three-four  . BACK SURGERY    . CATARACT EXTRACTION, BILATERAL Bilateral    about a month apart.   . COLONOSCOPY  04/2007  . ESOPHAGOGASTRODUODENOSCOPY    . ESOPHAGOGASTRODUODENOSCOPY  04/2007; 07/2008  . ESOPHAGOGASTRODUODENOSCOPY (EGD) WITH ESOPHAGEAL DILATION  "2-3 times"  . EXCISIONAL HEMORRHOIDECTOMY  ~ 1966  . FEMORAL REVISION Left 12/08/2016   Procedure: LEFT KNEE FEMORAL REVISION;  Surgeon: Gaynelle Arabian, MD;  Location: [facility];  Service: Orthopedics;  Laterality: Left;  Adductor Block  . FINGER SURGERY Right    pinky finger  . JOINT REPLACEMENT    . KNEE ARTHROTOMY Left 07/24/2018   Procedure: Left knee arthrotomy with scar excision;  Surgeon: Gaynelle Arabian, MD;  Location: [facility];  Service: Orthopedics;  Laterality: Left;  . KNEE CLOSED REDUCTION Left 06/21/2016   Procedure: CLOSED MANIPULATION LEFT KNEE;  Surgeon: Gaynelle Arabian, MD;  Location: [facility];  Service: Orthopedics;  Laterality: Left;  . KNEE CLOSED REDUCTION Left 02/28/2017   Procedure: CLOSED MANIPULATION KNEE;  Surgeon: Gaynelle Arabian, MD;  Location: [facility];  Service: Orthopedics;  Laterality: Left;  . KNEE JOINT MANIPULATION  06/2009  . LAPAROSCOPIC CHOLECYSTECTOMY  02/2000  . LUMBAR FUSION  03/16/2012   Dr Ronnald Ramp  . ORIF TIBIA PLATEAU Left 09/24/2015   Procedure: OPEN REDUCTION INTERNAL FIXATION (ORIF) LEFT TIBIAL PLATEAU;  Surgeon: Gaynelle Arabian, MD;  Location: [facility];  Service: Orthopedics;  Laterality: Left;  . PARTIAL KNEE ARTHROPLASTY Left 07/30/2015   Procedure: UNICOMPARTMENTAL LEFT KNEE MEDIAL;  Surgeon: Gaynelle Arabian, MD;  Location: [facility];  Service: Orthopedics;  Laterality: Left;  . POSTERIOR LUMBAR FUSION  04/2015   rods inserted L2  . REFRACTIVE SURGERY Right 1980s  . SHOULDER OPEN ROTATOR CUFF REPAIR Bilateral 1990s  . TENNIS ELBOW RELEASE/NIRSCHEL PROCEDURE Right 1980s  . TOTAL KNEE ARTHROPLASTY Right 03/2009  . TOTAL KNEE ARTHROPLASTY WITH  REVISION COMPONENTS Left 03/03/2016   Procedure: REVISION LEFT KNEE UNICOMPARTMENTAL ARTHROPLASTY TO TOTAL KNEE ARTHROPLASTY;  Surgeon: Gaynelle Arabian, MD;  Location: [facility];  Service: Orthopedics;  Laterality: Left;  requests 2hrs; Adductor Block    Prior to Admission medications   Medication Sig Start Date End Date Taking? Authorizing Provider  aspirin EC 81 MG tablet Take 81 mg by mouth daily.   Yes [provider]  atorvastatin (LIPITOR) 80 MG tablet Take 40-80 mg by mouth See admin instructions. Takes 80 mg by mouth daily on Monday, Wednesday, Fridays and take 40 mg by mouth on Tue, Thurs, Sat, and Sun   Yes [provider]  cholecalciferol (VITAMIN D3) 25 MCG (1000 UT) tablet Take 1,000 Units by mouth daily.    Yes [provider]  Cyanocobalamin (B-12) 5000 MCG CAPS Take 5,000 mcg by mouth daily.   Yes [provider]  doxycycline (VIBRAMYCIN) 100 MG capsule Take 100 mg by mouth at bedtime.    Yes [provider]  fenofibrate (TRICOR) 145 MG tablet Take 1 tablet (145 mg total) by mouth daily. 07/04/18  Yes [name], MD  finasteride (PROSCAR) 5 MG tablet Take 5 mg by mouth every evening.    Yes [provider]  gabapentin (NEURONTIN) 300 MG capsule Take 300 mg by mouth 2 (two) times daily.    Yes [provider]  glimepiride (AMARYL) 2 MG tablet Take 2 mg by mouth daily with breakfast.   Yes [provider]  Glucosamine-Chondroit-Vit C-Mn (GLUCOSAMINE CHONDR 1500 COMPLX PO) Take 1 tablet by mouth daily.   Yes [provider]  levothyroxine (SYNTHROID) 50 MCG tablet Take 50-100 mcg by mouth See admin instructions. Take 100 mcg by mouth daily on Monday, Wednesday and Friday. Take 50 mcg by mouth daily on Tue, Thurs, Sat, and Sun   Yes [provider]  Methylcellulose, Laxative, (CITRUCEL PO) Take 2 tablets by mouth daily.   Yes [provider]  metoprolol succinate (TOPROL-XL) 25 MG 24 hr  tablet Take 1 tablet (25 mg total) by mouth 2 (two) times daily. 06/06/18  Yes [name], MD  Multiple Vitamins-Minerals (CENTRUM SILVER PO) Take 1 tablet by mouth daily.    Yes [provider]  omeprazole (PRILOSEC) 20 MG capsule Take 20 mg by mouth daily.   Yes [provider]  oxyCODONE (OXY IR/ROXICODONE) 5 MG immediate release tablet Take 1 tablet (5 mg total) by mouth every 4 (four) hours as needed for moderate pain or severe pain. Patient taking differently: Take 5 mg by mouth daily.  10/20/18  Yes Eustace Moore, MD  pioglitazone-metformin (ACTOPLUS MET) 15-500 MG per tablet Take 1 tablet by mouth daily.    Yes [provider]  Saw Palmetto 450 MG CAPS Take 450 mg  by mouth daily.   Yes [provider]  Specialty Vitamins Products (BIOTIN PLUS KERATIN) 10000-100 MCG-MG TABS Take 1 tablet by mouth daily.   Yes [provider]  tamsulosin (FLOMAX) 0.4 MG CAPS capsule Take 0.4 mg by mouth daily.    Yes [provider]  docusate sodium (COLACE) 100 MG capsule Take 200 mg by mouth at bedtime.    [provider]   Left Knee Exam antalgic gait,no warmth or effusion, reduced range of motion (15-75), collateral ligaments intact  Physical Examination: General appearance - alert, well appearing, and in no distress Mental status - alert, oriented to person, place, and time Chest - clear to auscultation, no wheezes, rales or rhonchi, symmetric air entry Heart - normal rate, regular rhythm, normal S1, S2, no murmurs, rubs, clicks or gallops Abdomen - soft, nontender, nondistended, no masses or organomegaly Neurological - alert, oriented, normal speech, no focal findings or movement disorder noted   Asessment/Plan--- Left knee medial meniscal tear- - Plan left knee arthrotomy with scar excision Procedure risks and potential comps discussed with patient who elects to proceed. Goals are decreased pain and increased function with a high  likelihood of achieving both

## 2019-05-02 NOTE — Op Note (Signed)
[name], [name] MEDICAL RECORD [id number] ACCOUNT [id number] DATE OF BIRTH:1940/11/03 FACILITY: [facility] LOCATION: [facility] PHYSICIAN:Kaio Kuhlman Dulcy Fanny, MD  OPERATIVE REPORT  DATE OF PROCEDURE:  05/02/2019  PREOPERATIVE DIAGNOSIS:  Arthrofibrosis, left knee.  POSTOPERATIVE DIAGNOSIS:  Arthrofibrosis, left knee.  PROCEDURE:  Left knee arthrotomy and scar excision.  SURGEON:  Ollen Gross, MD  ASSISTANT:  Dennie Bible, PA-C  ANESTHESIA:  General.  ESTIMATED BLOOD LOSS:  Minimal.  DRAIN:  None.  TOURNIQUET TIME:  22 minutes at 300 mmHg.  COMPLICATIONS:  None.  CONDITION:  Stable to recovery.  BRIEF CLINICAL NOTE:  The patient is a 79 year old male with a very complex history in regards to his left knee.  He has had multiple operations after a trauma in which he had a periprosthetic tibial plateau fracture.  Last year, he had a revision  surgery and was starting to do well, but then unfortunately had a major lumbar fusion procedure done last fall and was unable to exercise the knee.  He has gone on to develop severe stiffness in the left knee with significant motion limitations.  It is  making it very difficult for him to walk and is causing pain in other areas.  He presents now for arthrotomy and scar excision.  PROCEDURE IN DETAIL:  After successful administration of general anesthetic, a tourniquet was placed high on his left thigh and his left lower extremity, prepped and draped in the usual sterile fashion.  Exam under anesthesia shows range of motion of 25  degrees to 60 degrees.  He had firm endpoints in flexion and extension.  The extremity was then wrapped in Esmarch and tourniquet inflated to 300 mmHg.  A midline incision was made with a 10 blade through subcutaneous tissue.  There was a lot of scarring  subcutaneously and this is dissected to create large subcutaneous flaps and eliminate the scar tissue.  A fresh blade was used to make a medial parapatellar  arthrotomy.  The patella was essentially fused by a soft tissue down to the femur.  He had  severe, severe scarring intra-articular which was severely impeding any range of motion.  Scar was excised starting on the medial side in the medial gutter, superomedial area, and inferomedial.  We then removed the scar from under the extensor mechanism  laterally.  The patella was essentially stuck down to the distal femur.  The adhesions are removed, which had led to much better patellar mobility.  The infrapatellar fat pad, which was all scarred, is also removed.  I then flexed the knee against  gravity and got 110 degrees.  With a little extra pressure, I was able to get to 115.  He still was lacking in flexion.  We got down to 15 degrees from full extension.  I told him preoperatively that we would be able to regain flexion with this  procedure, but it really would not affect his extension.  Once the scar was excised, I again flexed the knee and got to about between 110 and 115 degrees.  Wound was then copiously irrigated with saline solution using pulsatile lavage.  The arthrotomy was closed with a running #1 V-Loc suture.  Further  irrigation was performed in the subcutaneous tissues.  Flexion against gravity with the joint, closed was still about 105-110 degrees.  The tourniquet was then released.  Total time of 22 minutes.  Minor bleeding stopped with cautery.  Subcutaneous was  then closed with interrupted 2-0 Vicryl and skin with staples.  Incision  was cleaned and dried and sterile dressing applied.  He was awakened and transported to recovery in stable condition.  Note that a surgical assistant was a medical necessity for this procedure given the extent of the scar tissue and the very difficult exposure.  CN/NUANCE  D:05/02/2019 T:05/02/2019 JOB:[id number]

## 2019-05-03 DIAGNOSIS — M24662 Ankylosis, left knee: Secondary | ICD-10-CM | POA: Diagnosis not present

## 2019-05-03 LAB — BASIC METABOLIC PANEL
Anion gap: 8 (ref 5–15)
BUN: 19 mg/dL (ref 8–23)
CO2: 24 mmol/L (ref 22–32)
Calcium: 8.6 mg/dL — ABNORMAL LOW (ref 8.9–10.3)
Chloride: 105 mmol/L (ref 98–111)
Creatinine, Ser: 1.02 mg/dL (ref 0.61–1.24)
GFR calc Af Amer: >60 mL/min (ref >60)
GFR calc non Af Amer: >60 mL/min (ref >60)
Glucose, Bld: 260 mg/dL — ABNORMAL HIGH (ref 70–99)
Potassium: 4 mmol/L (ref 3.5–5.1)
Sodium: 137 mmol/L (ref 135–145)

## 2019-05-03 LAB — CBC
HCT: 35.2 % — ABNORMAL LOW (ref 39.0–52.0)
Hemoglobin: 11.4 g/dL — ABNORMAL LOW (ref 13.0–17.0)
MCH: 33.8 pg (ref 26.0–34.0)
MCHC: 32.4 g/dL (ref 30.0–36.0)
MCV: 104.5 fL — ABNORMAL HIGH (ref 80.0–100.0)
Platelets: 171 10*3/uL (ref 150–400)
RBC: 3.37 MIL/uL — ABNORMAL LOW (ref 4.22–5.81)
RDW: 13.1 % (ref 11.5–15.5)
WBC: 9.5 10*3/uL (ref 4.0–10.5)
nRBC: 0 % (ref 0.0–0.2)

## 2019-05-03 MED ORDER — METHOCARBAMOL 500 MG PO TABS: 500.0000 mg | ORAL_TABLET | Freq: Four times a day (QID) | ORAL | 0 refills | Status: DC | PRN

## 2019-05-03 MED ORDER — RIVAROXABAN 10 MG PO TABS: 10.0000 mg | ORAL_TABLET | Freq: Every day | ORAL | 0 refills | Status: DC

## 2019-05-03 NOTE — Progress Notes (Signed)
Subjective: 1 Day Post-Op Procedure(s) (LRB): Left knee arthrotomy; scar excision (Left) Patient reports pain as mild.   Patient seen in rounds with Dr. Lequita Halt. Patient is well, and has had no acute complaints or problems other than discomfort in the left knee. No acute events overnight. Voiding without difficulty, positive flatus. Denies CP, SHOB, N/V.  We will start therapy today.   Objective: Vital signs in last 24 hours: Temp:  [97.5 F (36.4 C)-98.6 F (37 C)] 98.6 F (37 C) (03/25 0553) Pulse Rate:  [66-79] 66 (03/25 0553) Resp:  [12-18] 18 (03/25 0553) BP: (93-140)/(38-74) 121/74 (03/25 0553) SpO2:  [96 %-100 %] 99 % (03/25 0553) Weight:  [102 kg] 102 kg (03/24 1713)  Intake/Output from previous day:  Intake/Output Summary (Last 24 hours) at 05/03/2019 0738 Last data filed at 05/03/2019 0600 Gross per 24 hour  Intake 2725 ml  Output 750 ml  Net 1975 ml     Intake/Output this shift: No intake/output data recorded.  Labs: Recent Labs    05/03/19 0310  HGB 11.4*   Recent Labs    05/03/19 0310  WBC 9.5  RBC 3.37*  HCT 35.2*  PLT 171   Recent Labs    05/03/19 0310  NA 137  K 4.0  CL 105  CO2 24  BUN 19  CREATININE 1.02  GLUCOSE 260*  CALCIUM 8.6*   No results for input(s): LABPT, INR in the last 72 hours.  Exam: General - Patient is Alert and Oriented Extremity - Neurologically intact Sensation intact distally Intact pulses distally Dorsiflexion/Plantar flexion intact Dressing - dressing C/D/I Motor Function - intact, moving foot and toes well on exam.   Past Medical History:  Diagnosis Date  . 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 Barrett's esophagus with dysplasia   . Chronic lower back pain    DDD  . Closed patellar sleeve fracture of left knee   . Constipation   . Crush injury knee    bilateral  . Dysphagia    EGD with dilitation  . Dyspnea    walking long distance  . Dysrhythmia    thought pt. had a fib after surgery saw cardiology later and ok meds preventive per pt.  . Enlarged prostate    sees a urologist  . Exertional dyspnea 03/21/2019  . GERD (gastroesophageal reflux disease)    takes Omeprazole daily  . Hard of hearing   . Hyperlipidemia    takes Lipitor and Fish Oil daily  . Insomnia    d/t pain  . Joint pain   . Joint swelling   . Left tibial fracture   . Multiple duodenal ulcers   . Nocturia   . OSA on CPAP    cpap  . Pneumonia 2001  . Type II diabetes mellitus (HCC)    type 2  . Unsteady gait   . Urinary frequency   . Wears glasses     Assessment/Plan: 1 Day Post-Op Procedure(s) (LRB): Left knee arthrotomy; scar excision (Left) Active Problems:   Status post knee replacement  Estimated body mass index is 36.31 kg/m as calculated from the following:   Height as of this encounter: 5' 5.98" (1.676 m).   Weight as of this encounter: 102 kg. Advance diet Up with therapy D/C IV fluids  DVT Prophylaxis - Xarelto Weight bearing as tolerated. D/C O2 and pulse ox and try on room air.  Plan is to go Home after hospital stay.  Plan for discharge today following 1 session of PT as long as he continues to do well and is meeting his goals. Scheduled for OPPT tomorrow. Patient states he does have enough pain medication at home currently. He will contact our office if he needs a refill. Leave aquacel dressing in place until follow up. Follow up in the office in 2 weeks.   Griffith Citron, PA-C Orthopedic Surgery [phone] 05/03/2019, 7:38 AM

## 2019-05-03 NOTE — Plan of Care (Signed)
Patient discharged home in stable condition 

## 2019-05-03 NOTE — Evaluation (Signed)
Physical Therapy Evaluation Patient Details Name: [name] MRN: [id number] DOB: 12-May-1940 Today's Date: 05/03/2019   History of Present Illness  Pt s/p L knee arthrotomy and scar excision with hx of multiple back surgery and L knee surgery  Clinical Impression  Pt admitted as above and presenting with functional mobility limitations 2* decreased L LE strength/ROM and post op pain.  Pt plans dc home this date with follow up OP PT starting tomorrow.    Follow Up Recommendations Outpatient PT;Follow surgeon's recommendation for DC plan and follow-up therapies    Equipment Recommendations  None recommended by PT    Recommendations for Other Services       Precautions / Restrictions Precautions Precautions: Knee;Fall Restrictions Weight Bearing Restrictions: No      Mobility  Bed Mobility Overal bed mobility: Modified Independent                Transfers Overall transfer level: Needs assistance Equipment used: Rolling walker (2 wheeled) Transfers: Sit to/from Stand Sit to Stand: Min guard;Supervision         General transfer comment: cues for LE management and use of UEs to self assist  Ambulation/Gait Ambulation/Gait assistance: Min guard;Supervision Gait Distance (Feet): 150 Feet Assistive device: Rolling walker (2 wheeled) Gait Pattern/deviations: Step-to pattern;Step-through pattern;Decreased step length - right;Decreased step length - left;Shuffle;Trunk flexed Gait velocity: decr   General Gait Details: min cues for position from RW and posture  Stairs Stairs: Yes Stairs assistance: Min guard Stair Management: No rails Number of Stairs: 1 General stair comments: Pt self cues for sequence  Wheelchair Mobility    Modified Rankin (Stroke Patients Only)       Balance Overall balance assessment: Mild deficits observed, not formally tested                                           Pertinent Vitals/Pain Pain Assessment:  0-10 Pain Score: 5  Pain Location: L knee Pain Descriptors / Indicators: Aching;Sore Pain Intervention(s): Limited activity within patient's tolerance;Monitored during session;Premedicated before session;Ice applied    Home Living Family/patient expects to be discharged to:: Private residence Living Arrangements: Spouse/significant other Available Help at Discharge: Family;Available 24 hours/day Type of Home: House Home Access: Stairs to enter Entrance Stairs-Rails: None Entrance Stairs-Number of Steps: threshold Home Layout: One level Home Equipment: Environmental consultant - 2 wheels;Bedside commode;Wheelchair - manual;Cane - single point      Prior Function Level of Independence: Independent with assistive device(s)         Comments: Uses SPC for ambulation. Drives. Works part time doing accounts payable 3 times/week for company. Does not do much IADLs.     Hand Dominance   Dominant Hand: Right    Extremity/Trunk Assessment   Upper Extremity Assessment Upper Extremity Assessment: Overall WFL for tasks assessed    Lower Extremity Assessment Lower Extremity Assessment: LLE deficits/detail LLE Deficits / Details: AAROM -10 - 60 with 3/5 quads    Cervical / Trunk Assessment Cervical / Trunk Assessment: Normal  Communication   Communication: No difficulties  Cognition Arousal/Alertness: Awake/alert Behavior During Therapy: WFL for tasks assessed/performed Overall Cognitive Status: Within Functional Limits for tasks assessed  General Comments      Exercises Total Joint Exercises Ankle Circles/Pumps: AROM;Both;15 reps;Supine Quad Sets: AROM;Both;10 reps;Supine Heel Slides: AAROM;Left;15 reps;Supine Straight Leg Raises: AAROM;AROM;Left;15 reps;Supine   Assessment/Plan    PT Assessment Patient needs continued PT services  PT Problem List Decreased strength;Decreased range of motion;Decreased activity tolerance;Decreased  mobility;Decreased knowledge of use of DME;Pain       PT Treatment Interventions DME instruction;Gait training;Stair training;Functional mobility training;Therapeutic activities;Therapeutic exercise;Patient/family education    PT Goals (Current goals can be found in the Care Plan section)  Acute Rehab PT Goals Patient Stated Goal: Regain IND PT Goal Formulation: All assessment and education complete, DC therapy    Frequency Min 1X/week   Barriers to discharge        Co-evaluation               AM-PAC PT "6 Clicks" Mobility  Outcome Measure Help needed turning from your back to your side while in a flat bed without using bedrails?: A Little Help needed moving from lying on your back to sitting on the side of a flat bed without using bedrails?: A Little Help needed moving to and from a bed to a chair (including a wheelchair)?: A Little Help needed standing up from a chair using your arms (e.g., wheelchair or bedside chair)?: A Little Help needed to walk in hospital room?: A Little Help needed climbing 3-5 steps with a railing? : A Little 6 Click Score: 18    End of Session Equipment Utilized During Treatment: Gait belt Activity Tolerance: Patient tolerated treatment well Patient left: in chair;with call bell/phone within reach;with chair alarm set Nurse Communication: Mobility status PT Visit Diagnosis: Difficulty in walking, not elsewhere classified (R26.2)    Time: 8786-7672 PT Time Calculation (min) (ACUTE ONLY): 33 min   Charges:   PT Evaluation $PT Eval Low Complexity: 1 Low PT Treatments $Gait Training: 8-22 mins        Mauro Kaufmann PT Acute Rehabilitation Services Pager [phone] Office [phone]   Kimmarie Pascale 05/03/2019, 9:35 AM

## 2019-05-03 NOTE — Progress Notes (Signed)
Pt. Refused cpap at this time. 

## 2019-05-08 ENCOUNTER — Other Ambulatory Visit: Payer: Self-pay

## 2019-05-08 ENCOUNTER — Ambulatory Visit
Admission: RE | Admit: 2019-05-08 | Discharge: 2019-05-08 | Disposition: A | Discharge status: Home / Self Care | Payer: Medicare Other | Source: Ambulatory Visit | Attending: Neurological Surgery | Admitting: Neurological Surgery

## 2019-05-08 DIAGNOSIS — M961 Postlaminectomy syndrome, not elsewhere classified: Secondary | ICD-10-CM

## 2019-05-22 ENCOUNTER — Other Ambulatory Visit: Payer: Self-pay

## 2019-05-22 ENCOUNTER — Encounter: Payer: Self-pay | Admitting: Cardiovascular Disease

## 2019-05-22 ENCOUNTER — Ambulatory Visit (INDEPENDENT_AMBULATORY_CARE_PROVIDER_SITE_OTHER): Payer: Medicare Other | Admitting: Cardiovascular Disease

## 2019-05-22 VITALS — BP 122/76 | HR 64 | Ht 67.0 in | Wt 223.0 lb

## 2019-05-22 DIAGNOSIS — Z9989 Dependence on other enabling machines and devices: Secondary | ICD-10-CM

## 2019-05-22 DIAGNOSIS — I480 Paroxysmal atrial fibrillation: Secondary | ICD-10-CM | POA: Diagnosis not present

## 2019-05-22 DIAGNOSIS — E7800 Pure hypercholesterolemia, unspecified: Secondary | ICD-10-CM

## 2019-05-22 DIAGNOSIS — G4733 Obstructive sleep apnea (adult) (pediatric): Secondary | ICD-10-CM | POA: Diagnosis not present

## 2019-05-22 DIAGNOSIS — Z5181 Encounter for therapeutic drug level monitoring: Secondary | ICD-10-CM

## 2019-05-22 DIAGNOSIS — E782 Mixed hyperlipidemia: Secondary | ICD-10-CM | POA: Diagnosis not present

## 2019-05-22 HISTORY — DX: Pure hypercholesterolemia, unspecified: E78.00

## 2019-05-22 MED ORDER — FISH OIL 1000 MG PO CAPS: 1000.0000 mg | ORAL_CAPSULE | Freq: Every day | ORAL | 1 refills | Status: DC

## 2019-05-22 NOTE — Progress Notes (Signed)
Cardiology Office Note   Date:  05/22/2019   ID:  [name], [name] Jun 18, 1940, MRN [id number]  PCP:  [name], MD  Cardiologist:   [name], MD   No chief complaint on file.    History of Present Illness: [name] is a 79 y.o. male with paroxysmal atrial fibrillation, diabetes, hyperlipidemia, and OSA on CPAP, who presents for follow-up. Mr. [name] was initially seen 06/21/16 when he developed postoperative atrial fibrillation. He underwent closed manipulation of the left knee and was noted to be in atrial fibrillation in the PACU. This was his first known episode of atrial fibrillation. Prior to the episode he noted 2 weeks of exertional dyspnea. He was relatively asymptomatic and was started on metoprolol and Eliquis. He followed up in atrial fibrillation clinic and was feeling well. TSH was mildly elevated but T4 was within the normal range. He was subsequetly started on low dose levothyroxine.He had an echo 07/02/16 that revealed LVEF 60-65% with grade 1 diastolic dysfunction.  He wore a 30-day event monitor 04/2017 that revealed no atrial fibrillation.  Given that his atrial fibrillation was perioperative Eliquis was discontinued and he was started on aspirin.  At his last appointment Mr. [name] reported exertional dyspnea.  He was referred for a Lexiscan Myoview 04/2019 that revealed LVEF 60% and no ischemia.  Amiodarone was discontinued.  He had another knee surgery 04/2019.  He had a lot of scar tissue removed.  Since then his knee has been feeling better.  He has been doing physical therapy twice per week and does well with this.  He continues to have some exertional dyspnea.  He notes that he is not getting much exercise outside of physical therapy.  He does not get any cardio.  He denies any chest pain or pressure.  He denies lower extremity edema, orthopnea, or PND.  He did recently purchased a recumbent bike and hopes to start using that side.  He is also  hopeful to get back onto the golf course.   Past Medical History:  Diagnosis Date  . 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 Barrett's esophagus with dysplasia   . Chronic lower back pain    DDD  . Closed patellar sleeve fracture of left knee   . Constipation   . Crush injury knee    bilateral  . Dysphagia    EGD with dilitation  . Dyspnea    walking long distance  . Dysrhythmia    thought pt. had a fib after surgery saw cardiology later and ok meds preventive per pt.  . Enlarged prostate    sees a urologist  . Exertional dyspnea 03/21/2019  . GERD (gastroesophageal reflux disease)    takes Omeprazole daily  . Hard of hearing   . Hyperlipidemia    takes Lipitor and Fish Oil daily  . Insomnia    d/t pain  . Joint pain   . Joint swelling   . Left tibial fracture   . Multiple duodenal ulcers   . Nocturia   . OSA on CPAP    cpap  . Pneumonia 2001  . Pure hypercholesterolemia 05/22/2019  . Type II diabetes mellitus ([place])    type 2  . Unsteady gait   . Urinary frequency   . Wears glasses     Past Surgical History:  Procedure Laterality Date  . ANTERIOR CERVICAL DECOMP/DISCECTOMY FUSION  10/2010  . ANTERIOR LAT LUMBAR FUSION  12/02/2011   Procedure: ANTERIOR LATERAL LUMBAR FUSION 1 LEVEL;  Surgeon: Eustace Moore, MD;  Location: [facility];  Service: Neurosurgery;  Laterality: Left;  left lumbar three-four  . BACK SURGERY    . CATARACT EXTRACTION, BILATERAL Bilateral    about a month apart.   . COLONOSCOPY  04/2007  . ESOPHAGOGASTRODUODENOSCOPY    . ESOPHAGOGASTRODUODENOSCOPY  04/2007; 07/2008  . ESOPHAGOGASTRODUODENOSCOPY (EGD) WITH ESOPHAGEAL DILATION  "2-3 times"  . EXCISIONAL HEMORRHOIDECTOMY  ~ 1966  . FEMORAL REVISION Left 12/08/2016   Procedure: LEFT KNEE FEMORAL REVISION;  Surgeon: Gaynelle Arabian, MD;  Location: [facility];  Service: Orthopedics;  Laterality: Left;  Adductor Block  . FINGER SURGERY Right    pinky finger  .  JOINT REPLACEMENT    . KNEE ARTHROTOMY Left 07/24/2018   Procedure: Left knee arthrotomy with scar excision;  Surgeon: Gaynelle Arabian, MD;  Location: [facility];  Service: Orthopedics;  Laterality: Left;  . KNEE ARTHROTOMY Left 05/02/2019   Procedure: Left knee arthrotomy; scar excision;  Surgeon: Gaynelle Arabian, MD;  Location: [facility];  Service: Orthopedics;  Laterality: Left;  29mn  . KNEE CLOSED REDUCTION Left 06/21/2016   Procedure: CLOSED MANIPULATION LEFT KNEE;  Surgeon: AGaynelle Arabian MD;  Location: [facility];  Service: Orthopedics;  Laterality: Left;  . KNEE CLOSED REDUCTION Left 02/28/2017   Procedure: CLOSED MANIPULATION KNEE;  Surgeon: AGaynelle Arabian MD;  Location: [facility];  Service: Orthopedics;  Laterality: Left;  . KNEE JOINT MANIPULATION  06/2009  . LAPAROSCOPIC CHOLECYSTECTOMY  02/2000  . LUMBAR FUSION  03/16/2012   Dr JRonnald Ramp . ORIF TIBIA PLATEAU Left 09/24/2015   Procedure: OPEN REDUCTION INTERNAL FIXATION (ORIF) LEFT TIBIAL PLATEAU;  Surgeon: FGaynelle Arabian MD;  Location: [facility];  Service: Orthopedics;  Laterality: Left;  . PARTIAL KNEE ARTHROPLASTY Left 07/30/2015   Procedure: UNICOMPARTMENTAL LEFT KNEE MEDIAL;  Surgeon: FGaynelle Arabian MD;  Location: [facility];  Service: Orthopedics;  Laterality: Left;  . POSTERIOR LUMBAR FUSION  04/2015   rods inserted L2  . REFRACTIVE SURGERY Right 1980s  . SHOULDER OPEN ROTATOR CUFF REPAIR Bilateral 1990s  . TENNIS ELBOW RELEASE/NIRSCHEL PROCEDURE Right 1980s  . TOTAL KNEE ARTHROPLASTY Right 03/2009  . TOTAL KNEE ARTHROPLASTY WITH REVISION COMPONENTS Left 03/03/2016   Procedure: REVISION LEFT KNEE UNICOMPARTMENTAL ARTHROPLASTY TO TOTAL KNEE ARTHROPLASTY;  Surgeon: FGaynelle Arabian MD;  Location: [facility];  Service: Orthopedics;  Laterality: Left;  requests 2hrs; Adductor Block     Current Outpatient Medications  Medication Sig Dispense Refill  . atorvastatin (LIPITOR) 80 MG tablet Take 40-80 mg by mouth See admin instructions. Takes 80 mg by mouth daily  on Monday, Wednesday, Fridays and take 40 mg by mouth on Tue, Thurs, Sat, and Sun    . docusate sodium (COLACE) 100 MG capsule Take 200 mg by mouth at bedtime.    .[name] Kitchendoxycycline (VIBRAMYCIN) 100 MG capsule Take 100 mg by mouth at bedtime.     . fenofibrate (TRICOR) 145 MG tablet Take 1 tablet (145 mg total) by mouth daily. 30 tablet 11  . finasteride (PROSCAR) 5 MG tablet Take 5 mg by mouth every evening.     . gabapentin (NEURONTIN) 300 MG capsule Take 300 mg by mouth 2 (two) times daily.     .[name] Kitchenglimepiride (AMARYL) 2 MG tablet Take 2 mg by mouth daily with breakfast.    . levothyroxine (SYNTHROID) 50 MCG tablet Take 50-100 mcg by mouth See admin instructions. Take 100 mcg by mouth daily on Monday,  Wednesday and Friday. Take 50 mcg by mouth daily on Tue, Thurs, Sat, and Sun    . methocarbamol (ROBAXIN) 500 MG tablet Take 1 tablet (500 mg total) by mouth every 6 (six) hours as needed for muscle spasms. 40 tablet 0  . Methylcellulose, Laxative, (CITRUCEL PO) Take 2 tablets by mouth daily.    . metoprolol succinate (TOPROL-XL) 25 MG 24 hr tablet Take 1 tablet (25 mg total) by mouth 2 (two) times daily. 180 tablet 3  . omeprazole (PRILOSEC) 20 MG capsule Take 20 mg by mouth daily.    [name] Kitchen oxyCODONE (OXY IR/ROXICODONE) 5 MG immediate release tablet Take 1 tablet (5 mg total) by mouth every 4 (four) hours as needed for moderate pain or severe pain. (Patient taking differently: Take 5 mg by mouth daily. ) 60 tablet 0  . pioglitazone-metformin (ACTOPLUS MET) 15-500 MG per tablet Take 1 tablet by mouth daily.     . rivaroxaban (XARELTO) 10 MG TABS tablet Take 1 tablet (10 mg total) by mouth daily with breakfast for 20 days. Then resume normal dose of Aspirin 81 mg daily. 20 tablet 0  . tamsulosin (FLOMAX) 0.4 MG CAPS capsule Take 0.4 mg by mouth daily.     . Omega-3 Fatty Acids (FISH OIL) 1000 MG CAPS Take 1 capsule (1,000 mg total) by mouth daily. 90 capsule 1   Current Facility-Administered Medications    Medication Dose Route Frequency Provider Last Rate Last Admin  . incobotulinumtoxinA (XEOMIN) 100 units injection 400 Units  400 Units Intramuscular Q90 days Marcial Pacas, MD   400 Units at 12/15/17 1446    Allergies:   Patient has no known allergies.    Social History:  The patient  reports that he quit smoking about 32 years ago. His smoking use included cigarettes. He has a 22.50 pack-year smoking history. He has never used smokeless tobacco. He reports previous alcohol use. He reports that he does not use drugs.   Family History:  The patient's family history includes Diabetes in his brother; Hodgkin's lymphoma in his brother; Lung cancer in his brother, brother, brother, and father; Stomach cancer in his mother.    ROS:  Please see the history of present illness.   Otherwise, review of systems are positive for none.   All other systems are reviewed and negative.    PHYSICAL EXAM: VS:  BP 122/76   Pulse 64   Ht '5\' 7"'$  (1.702 m)   Wt 223 lb (101.2 kg)   SpO2 96%   BMI 34.93 kg/m  , BMI Body mass index is 34.93 kg/m. GENERAL:  Well appearing HEENT: Pupils equal round and reactive, fundi not visualized, oral mucosa unremarkable NECK:  No jugular venous distention, waveform within normal limits, carotid upstroke brisk and symmetric, no bruits LUNGS:  Clear to auscultation bilaterally HEART:  RRR.  PMI not displaced or sustained,S1 and S2 within normal limits, no S3, no S4, no clicks, no rubs, no murmurs ABD:  Flat, positive bowel sounds normal in frequency in pitch, no bruits, no rebound, no guarding, no midline pulsatile mass, no hepatomegaly, no splenomegaly EXT:  2 plus pulses throughout, no edema, no cyanosis no clubbing SKIN:  No rashes no nodules NEURO:  Cranial nerves II through XII grossly intact, motor grossly intact throughout PSYCH:  Cognitively intact, oriented to person place and time   EKG:  EKG is ordered today. 03/21/2019: Sinus bradycardia.  Rate 54 bpm.   First-degree AV block.  Lexiscan Myoview 04/2019: Baseline EKG showed NSR  with early repolarization with 63m of ST elevation in inferior leads and V6. No change during Lexiscan infusion.  The left ventricular ejection fraction is normal (55-65%).  Nuclear stress EF: 60%.  The study is normal.  This is a low risk study.    Echo 07/02/16: Study Conclusions  - Left ventricle: The cavity size was normal. Wall thickness was   increased in a pattern of mild LVH. Systolic function was normal.   The estimated ejection fraction was in the range of 60% to 65%.   Wall motion was normal; there were no regional wall motion   abnormalities. Doppler parameters are consistent with abnormal   left ventricular relaxation (grade 1 diastolic dysfunction). - Aortic valve: There was no stenosis. - Mitral valve: Mildly calcified annulus. There was trivial   regurgitation. - Right ventricle: The cavity size was normal. Systolic function   was normal. - Tricuspid valve: Peak RV-RA gradient (S): 26 mm Hg. - Pulmonary arteries: PA peak pressure: 29 mm Hg (S). - Inferior vena cava: The vessel was normal in size. The   respirophasic diameter changes were in the normal range (>= 50%),   consistent with normal central venous pressure.  Impressions:  - Normal LV size with mild LV hypertrophy, EF 60-65%. Normal RV   size and systolic function. No signficant valvular abnormalities.  30 Day Event Monitor 02/2017: Quality: Fair. Baseline artifact. Predominant rhythm: sinus  Rare PVCs No arrhythmias noted   Recent Labs: 05/03/2019: BUN 19; Creatinine, Ser 1.02; Hemoglobin 11.4; Platelets 171; Potassium 4.0; Sodium 137   03/29/18: Total cholesterol 172, triglycerides  321, HDL 36, LDL 72  01/15/2019: Total cholesterol 136, triglycerides 117, HDL 46  05/14/2019: WBC 6.5, hemoglobin 12.1, hematocrit sodium 142, potassium 4.6, BUN 18, creatinine 1.19 AST 19, ALT 15 Hemoglobin A1c 6.1% Total  cholesterol 141, triglycerides 195, HDL 34, LDL 74 TSH 8.51  Lipid Panel No results found for: CHOL, TRIG, HDL, CHOLHDL, VLDL, LDLCALC, LDLDIRECT    Wt Readings from Last 3 Encounters:  05/22/19 223 lb (101.2 kg)  05/02/19 224 lb 13.9 oz (102 kg)  04/24/19 225 lb 5 oz (102.2 kg)      ASSESSMENT AND PLAN:  # Exertional dyspnea:   Lexiscan Myoview was negative for ischemia 04/2019.  He has no evidence of heart failure.  I suspect this is due to deconditioning.  We discussed the importance of getting back into an exercise routine to increase his stamina.  He expressed understanding.  # Paroxysmal atrial fibrillation: Mr. CColcloughhad an episode of peri-operative atrial fibrillation. He has never experienced any recurrent symptoms.  30 day event monitor showed no arrhythmias and Eliquis and amiodarone have been discontinued.  He was on Xarelto after his recent knee surgery and will go back to aspirin when this is completed.  # Hyperlipidemia:  # Hypertriglyceridemia: Continue atorvastatin and fenofibrate.  Triglycerides had improved to 117 and are now back up to 166.  He will resume fish oil 1000 mg daily.  Continue atorvastatin and fenofibrate.  Repeat lipids and a CMP in 6 months.   # OSA:  Continue CPAP.  Current medicines are reviewed at length with the patient today.  The patient does not have concerns regarding medicines.  The following changes have been made:  Add fish oil  Labs/ tests ordered today include:   Orders Placed This Encounter  Procedures  . Lipid panel  . Comprehensive metabolic panel     Disposition:   FU with Samule Life C. ROval Linsey MD,  FACC in 6 months.  If he is stable at that time we will follow-up in 1 year.  This note was written with the assistance of speech recognition software.  Please excuse any transcriptional errors.  Signed, Bettyjane Shenoy C. Oval Linsey, MD, [facility]  05/22/2019 8:15 AM    [place]

## 2019-05-22 NOTE — Patient Instructions (Signed)
Medication Instructions:  START FISH OIL 1000 MG DAILY   *If you need a refill on your cardiac medications before your next appointment, please call your pharmacy*  Lab Work: FASTING LP/CMET IN 6 MONTHS FEW DAYS PRIOR TO YOUR FOLLOW UP  If you have labs (blood work) drawn today and your tests are completely normal, you will receive your results only by: [name] Kitchen MyChart Message (if you have MyChart) OR . A paper copy in the mail If you have any lab test that is abnormal or we need to change your treatment, we will call you to review the results.  Testing/Procedures: NONE  Follow-Up: At [facility], you and your health needs are our priority.  As part of our continuing mission to provide you with exceptional heart care, we have created designated Provider Care Teams.  These Care Teams include your primary Cardiologist (physician) and Advanced Practice Providers (APPs -  Physician Assistants and Nurse Practitioners) who all work together to provide you with the care you need, when you need it.  We recommend signing up for the patient portal called "MyChart".  Sign up information is provided on this After Visit Summary.  MyChart is used to connect with patients for Virtual Visits (Telemedicine).  Patients are able to view lab/test results, encounter notes, upcoming appointments, etc.  Non-urgent messages can be sent to your provider as well.   To learn more about what you can do with MyChart, go to [email or web address].    Your next appointment:   6 month(s)You will receive a reminder letter in the mail two months in advance. If you don't receive a letter, please call our office to schedule the follow-up appointment.   The format for your next appointment:   In Person  Provider:   You may see Chilton Si, MD or one of the following Advanced Practice Providers on your designated Care Team:    Corine Shelter, PA-C  [place], [place]  Edd Fabian, [place]

## 2019-06-06 NOTE — Telephone Encounter (Signed)
error 

## 2019-06-30 ENCOUNTER — Other Ambulatory Visit: Payer: Self-pay | Admitting: Cardiovascular Disease

## 2019-07-07 ENCOUNTER — Other Ambulatory Visit: Payer: Self-pay | Admitting: Cardiovascular Disease

## 2019-11-22 ENCOUNTER — Encounter: Payer: Self-pay | Admitting: *Deleted

## 2019-11-22 LAB — COMPREHENSIVE METABOLIC PANEL
ALT: 19 IU/L (ref 0–44)
AST: 15 IU/L (ref 0–40)
Albumin/Globulin Ratio: 2.1 (ref 1.2–2.2)
Albumin: 4 g/dL (ref 3.7–4.7)
Alkaline Phosphatase: 64 IU/L (ref 44–121)
BUN/Creatinine Ratio: 17 (ref 10–24)
BUN: 16 mg/dL (ref 8–27)
Bilirubin Total: 1 mg/dL (ref 0.0–1.2)
CO2: 22 mmol/L (ref 20–29)
Calcium: 9.1 mg/dL (ref 8.6–10.2)
Chloride: 102 mmol/L (ref 96–106)
Creatinine, Ser: 0.92 mg/dL (ref 0.76–1.27)
GFR calc Af Amer: 91 mL/min/{1.73_m2} (ref >59)
GFR calc non Af Amer: 79 mL/min/{1.73_m2} (ref >59)
Globulin, Total: 1.9 g/dL (ref 1.5–4.5)
Glucose: 176 mg/dL — ABNORMAL HIGH (ref 65–99)
Potassium: 4.4 mmol/L (ref 3.5–5.2)
Sodium: 137 mmol/L (ref 134–144)
Total Protein: 5.9 g/dL — ABNORMAL LOW (ref 6.0–8.5)

## 2019-11-22 LAB — LIPID PANEL
Chol/HDL Ratio: 4.6 ratio (ref 0.0–5.0)
Cholesterol, Total: 167 mg/dL (ref 100–199)
HDL: 36 mg/dL — ABNORMAL LOW (ref >39)
LDL Chol Calc (NIH): 91 mg/dL (ref 0–99)
Triglycerides: 234 mg/dL — ABNORMAL HIGH (ref 0–149)
VLDL Cholesterol Cal: 40 mg/dL (ref 5–40)

## 2019-11-29 ENCOUNTER — Other Ambulatory Visit: Payer: Self-pay

## 2019-11-29 ENCOUNTER — Encounter: Payer: Self-pay | Admitting: Cardiovascular Disease

## 2019-11-29 ENCOUNTER — Ambulatory Visit (INDEPENDENT_AMBULATORY_CARE_PROVIDER_SITE_OTHER): Payer: Medicare Other | Admitting: Cardiovascular Disease

## 2019-11-29 VITALS — BP 128/64 | HR 58 | Ht 67.0 in | Wt 222.2 lb

## 2019-11-29 DIAGNOSIS — E7800 Pure hypercholesterolemia, unspecified: Secondary | ICD-10-CM | POA: Diagnosis not present

## 2019-11-29 DIAGNOSIS — I480 Paroxysmal atrial fibrillation: Secondary | ICD-10-CM

## 2019-11-29 MED ORDER — FENOFIBRATE 145 MG PO TABS: 145.0000 mg | ORAL_TABLET | Freq: Every day | ORAL | 3 refills | Status: DC

## 2019-11-29 NOTE — Progress Notes (Signed)
Cardiology Office Note   Date:  11/29/2019   ID:  [name], [name] 09/27/1940, MRN [id number]  PCP:  [name] Gravel, MD  Cardiologist:   [name] Latch, MD   No chief complaint on file.    History of Present Illness: [name] is a 79 y.o. male with paroxysmal atrial fibrillation, diabetes, hyperlipidemia, and OSA on CPAP, who presents for follow-up. [name] was initially seen 06/21/16 when he developed postoperative atrial fibrillation. He underwent closed manipulation of [name] left knee and was noted to be in atrial fibrillation in [name] PACU. This was his first known episode of atrial fibrillation. Prior to [name] episode he noted 2 weeks of exertional dyspnea. He was relatively asymptomatic and was started on metoprolol and Eliquis. He followed up in atrial fibrillation clinic and was feeling well. TSH was mildly elevated but T4 was within [name] normal range. He was subsequetly started on low dose levothyroxine.He had an echo 07/02/16 that revealed LVEF 60-65% with grade 1 diastolic dysfunction.  He wore a 30-day event monitor 04/2017 that revealed no atrial fibrillation.  Given that his atrial fibrillation was perioperative Eliquis was discontinued and he was started on aspirin.  [name] reported exertional dyspnea.  He was referred for a Lexiscan Myoview 04/2019 that revealed LVEF 60% and no ischemia.  Amiodarone was discontinued.  He had another knee surgery 04/2019.  He had a lot of scar tissue removed.  He has been struggling with L knee pain and an inability to straighten it.  It affects his back and makes other joints hurt.  He hasn't been able to get much exercise lately.  He has no exertional chest pain but does get short of breath with exertion sometimes.  He has no lower extremity edema other than [name] swelling in his left knee.  He denies orthopnea or PND.  He is unsure why or when his fenofibrate was discontinued.  He notes that his diet has been poor lately.   Past  Medical History:  Diagnosis Date  . 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 Barrett's esophagus with dysplasia   . Chronic lower back pain    DDD  . Closed patellar sleeve fracture of left knee   . Constipation   . Crush injury knee    bilateral  . Dysphagia    EGD with dilitation  . Dyspnea    walking long distance  . Dysrhythmia    thought pt. had a fib after surgery saw cardiology later and ok meds preventive per pt.  . Enlarged prostate    sees a urologist  . Exertional dyspnea 03/21/2019  . GERD (gastroesophageal reflux disease)    takes Omeprazole daily  . Hard of hearing   . Hyperlipidemia    takes Lipitor and Fish Oil daily  . Insomnia    d/t pain  . Joint pain   . Joint swelling   . Left tibial fracture   . Multiple duodenal ulcers   . Nocturia   . OSA on CPAP    cpap  . Pneumonia 2001  . Pure hypercholesterolemia 05/22/2019  . Type II diabetes mellitus ([place])    type 2  . Unsteady gait   . Urinary frequency   . Wears glasses     Past Surgical History:  Procedure Laterality Date  . ANTERIOR CERVICAL DECOMP/DISCECTOMY FUSION  10/2010  . ANTERIOR LAT LUMBAR FUSION  12/02/2011   Procedure: ANTERIOR LATERAL LUMBAR  FUSION 1 LEVEL;  Surgeon: Eustace Moore, MD;  Location: [facility];  Service: Neurosurgery;  Laterality: Left;  left lumbar three-four  . BACK SURGERY    . CATARACT EXTRACTION, BILATERAL Bilateral    about a month apart.   . COLONOSCOPY  04/2007  . ESOPHAGOGASTRODUODENOSCOPY    . ESOPHAGOGASTRODUODENOSCOPY  04/2007; 07/2008  . ESOPHAGOGASTRODUODENOSCOPY (EGD) WITH ESOPHAGEAL DILATION  "2-3 times"  . EXCISIONAL HEMORRHOIDECTOMY  ~ 1966  . FEMORAL REVISION Left 12/08/2016   Procedure: LEFT KNEE FEMORAL REVISION;  Surgeon: Gaynelle Arabian, MD;  Location: [facility];  Service: Orthopedics;  Laterality: Left;  Adductor Block  . FINGER SURGERY Right    pinky finger  . JOINT REPLACEMENT    . KNEE ARTHROTOMY Left 07/24/2018     Procedure: Left knee arthrotomy with scar excision;  Surgeon: Gaynelle Arabian, MD;  Location: [facility];  Service: Orthopedics;  Laterality: Left;  . KNEE ARTHROTOMY Left 05/02/2019   Procedure: Left knee arthrotomy; scar excision;  Surgeon: Gaynelle Arabian, MD;  Location: [facility];  Service: Orthopedics;  Laterality: Left;  75mn  . KNEE CLOSED REDUCTION Left 06/21/2016   Procedure: CLOSED MANIPULATION LEFT KNEE;  Surgeon: AGaynelle Arabian MD;  Location: [facility];  Service: Orthopedics;  Laterality: Left;  . KNEE CLOSED REDUCTION Left 02/28/2017   Procedure: CLOSED MANIPULATION KNEE;  Surgeon: AGaynelle Arabian MD;  Location: [facility];  Service: Orthopedics;  Laterality: Left;  . KNEE JOINT MANIPULATION  06/2009  . LAPAROSCOPIC CHOLECYSTECTOMY  02/2000  . LUMBAR FUSION  03/16/2012   Dr JRonnald Ramp . ORIF TIBIA PLATEAU Left 09/24/2015   Procedure: OPEN REDUCTION INTERNAL FIXATION (ORIF) LEFT TIBIAL PLATEAU;  Surgeon: FGaynelle Arabian MD;  Location: [facility];  Service: Orthopedics;  Laterality: Left;  . PARTIAL KNEE ARTHROPLASTY Left 07/30/2015   Procedure: UNICOMPARTMENTAL LEFT KNEE MEDIAL;  Surgeon: FGaynelle Arabian MD;  Location: [facility];  Service: Orthopedics;  Laterality: Left;  . POSTERIOR LUMBAR FUSION  04/2015   rods inserted L2  . REFRACTIVE SURGERY Right 1980s  . SHOULDER OPEN ROTATOR CUFF REPAIR Bilateral 1990s  . TENNIS ELBOW RELEASE/NIRSCHEL PROCEDURE Right 1980s  . TOTAL KNEE ARTHROPLASTY Right 03/2009  . TOTAL KNEE ARTHROPLASTY WITH REVISION COMPONENTS Left 03/03/2016   Procedure: REVISION LEFT KNEE UNICOMPARTMENTAL ARTHROPLASTY TO TOTAL KNEE ARTHROPLASTY;  Surgeon: FGaynelle Arabian MD;  Location: [facility];  Service: Orthopedics;  Laterality: Left;  requests 2hrs; Adductor Block     Current Outpatient Medications  Medication Sig Dispense Refill  . atorvastatin (LIPITOR) 80 MG tablet Take 40-80 mg by mouth See admin instructions. Takes 80 mg by mouth daily on Monday, Wednesday, Fridays and take 40 mg by mouth on  Tue, Thurs, Sat, and Sun    . docusate sodium (COLACE) 100 MG capsule Take 200 mg by mouth at bedtime.    . finasteride (PROSCAR) 5 MG tablet Take 5 mg by mouth every evening.     .[name] Kitchenglimepiride (AMARYL) 2 MG tablet Take 2 mg by mouth daily with breakfast.    . levothyroxine (SYNTHROID) 50 MCG tablet Take 50-100 mcg by mouth See admin instructions. Take 100 mcg by mouth daily on Monday, Wednesday and Friday. Take 50 mcg by mouth daily on Tue, Thurs, Sat, and Sun    . Methylcellulose, Laxative, (CITRUCEL PO) Take 2 tablets by mouth daily.    . metoprolol succinate (TOPROL-XL) 25 MG 24 hr tablet Take 1 tablet by mouth twice daily 180 tablet 2  . Omega-3 Fatty Acids (FISH OIL) 1000 MG CAPS  Take 1 capsule (1,000 mg total) by mouth daily. 90 capsule 1  . omeprazole (PRILOSEC) 20 MG capsule Take 20 mg by mouth daily.    [name] Kitchen oxyCODONE (OXY IR/ROXICODONE) 5 MG immediate release tablet Take 1 tablet (5 mg total) by mouth every 4 (four) hours as needed for moderate pain or severe pain. (Dyer taking differently: Take 5 mg by mouth daily. ) 60 tablet 0  . pioglitazone-metformin (ACTOPLUS MET) 15-500 MG per tablet Take 1 tablet by mouth daily.     . tamsulosin (FLOMAX) 0.4 MG CAPS capsule Take 0.4 mg by mouth daily.     . fenofibrate (TRICOR) 145 MG tablet Take 1 tablet (145 mg total) by mouth daily. 90 tablet 3   Current Facility-Administered Medications  Medication Dose Route Frequency Provider Last Rate Last Admin  . incobotulinumtoxinA (XEOMIN) 100 units injection 400 Units  400 Units Intramuscular Q90 days Marcial Pacas, MD   400 Units at 12/15/17 1446    Allergies:   Dyer has no known allergies.    Social History:  [name]  reports that he quit smoking about 32 years ago. His smoking use included cigarettes. He has a 22.50 pack-year smoking history. He has never used smokeless tobacco. He reports previous alcohol use. He reports that he does not use drugs.   Family History:  [name]'s family  history includes Diabetes in his brother; Hodgkin's lymphoma in his brother; Lung cancer in his brother, brother, brother, and father; Stomach cancer in his mother.    ROS:  Please see [name] history of present illness.   Otherwise, review of systems are positive for none.   All other systems are reviewed and negative.    PHYSICAL EXAM: VS:  BP 128/64   Pulse (!) 58   Ht 5' 7"  (1.702 m)   Wt 222 lb 3.2 oz (100.8 kg)   SpO2 95%   BMI 34.80 kg/m  , BMI Body mass index is 34.8 kg/m. GENERAL:  Well appearing HEENT: Pupils equal round and reactive, fundi not visualized, oral mucosa unremarkable NECK:  No jugular venous distention, waveform within normal limits, carotid upstroke brisk and symmetric, no bruits LUNGS:  Clear to auscultation bilaterally HEART:  RRR.  PMI not displaced or sustained,S1 and S2 within normal limits, no S3, no S4, no clicks, no rubs, no murmurs ABD:  Flat, positive bowel sounds normal in frequency in pitch, no bruits, no rebound, no guarding, no midline pulsatile mass, no hepatomegaly, no splenomegaly EXT:  2 plus pulses throughout, no edema, no cyanosis no clubbing SKIN:  No rashes no nodules NEURO:  Cranial nerves II through XII grossly intact, motor grossly intact throughout PSYCH:  Cognitively intact, oriented to person place and time   EKG:  EKG is ordered today. 03/21/2019: Sinus bradycardia.  Rate 54 bpm.  First-degree AV block. 11/29/19: Sinus bradycardia.  Rate 58 bpm.  Lexiscan Myoview 04/2019: Baseline EKG showed NSR with early repolarization with 32m of ST elevation in inferior leads and V6. No change during Lexiscan infusion.  [name] left ventricular ejection fraction is normal (55-65%).  Nuclear stress EF: 60%.  [name] study is normal.  This is a low risk study.    Echo 07/02/16: Study Conclusions  - Left ventricle: [name] cavity size was normal. Wall thickness was   increased in a pattern of mild LVH. Systolic function was normal.   [name] estimated  ejection fraction was in [name] range of 60% to 65%.   Wall motion was normal; there were no  regional wall motion   abnormalities. Doppler parameters are consistent with abnormal   left ventricular relaxation (grade 1 diastolic dysfunction). - Aortic valve: There was no stenosis. - Mitral valve: Mildly calcified annulus. There was trivial   regurgitation. - Right ventricle: [name] cavity size was normal. Systolic function   was normal. - Tricuspid valve: Peak RV-RA gradient (S): 26 mm Hg. - Pulmonary arteries: PA peak pressure: 29 mm Hg (S). - Inferior vena cava: [name] vessel was normal in size. [name]   respirophasic diameter changes were in [name] normal range (>= 50%),   consistent with normal central venous pressure.  Impressions:  - Normal LV size with mild LV hypertrophy, EF 60-65%. Normal RV   size and systolic function. No signficant valvular abnormalities.  30 Day Event Monitor 02/2017: Quality: Fair. Baseline artifact. Predominant rhythm: sinus  Rare PVCs No arrhythmias noted   Recent Labs: 05/03/2019: Hemoglobin 11.4; Platelets 171 11/22/2019: ALT 19; BUN 16; Creatinine, Ser 0.92; Potassium 4.4; Sodium 137   03/29/18: Total cholesterol 172, triglycerides  321, HDL 36, LDL 72  01/15/2019: Total cholesterol 136, triglycerides 117, HDL 46  05/14/2019: WBC 6.5, hemoglobin 12.1, hematocrit sodium 142, potassium 4.6, BUN 18, creatinine 1.19 AST 19, ALT 15 Hemoglobin A1c 6.1% Total cholesterol 141, triglycerides 195, HDL 34, LDL 74 TSH 8.51  Lipid Panel    Component Value Date/Time   CHOL 167 11/22/2019 0828   TRIG 234 (H) 11/22/2019 0828   HDL 36 (L) 11/22/2019 0828   CHOLHDL 4.6 11/22/2019 0828   LDLCALC 91 11/22/2019 0828      Wt Readings from Last 3 Encounters:  11/29/19 222 lb 3.2 oz (100.8 kg)  05/22/19 223 lb (101.2 kg)  05/02/19 224 lb 13.9 oz (102 kg)      ASSESSMENT AND PLAN:  # Exertional dyspnea:   Lexiscan Myoview was negative for ischemia 04/2019.   He has no evidence of heart failure.  I suspect this is due to deconditioning.  We discussed [name] importance of getting back into an exercise routine to increase his stamina.  He expressed understanding.  # Paroxysmal atrial fibrillation: [name] had an episode of peri-operative atrial fibrillation. He has never experienced any recurrent symptoms.  30 day event monitor showed no arrhythmias and Eliquis and amiodarone have been discontinued.  He was on Xarelto after his recent knee surgery and will go back to aspirin when this is completed.  If he requires additional surgery, consider perioperative amiodarone.  # Hyperlipidemia:  # Hypertriglyceridemia: Continue atorvastatin.  Triglycerides are elevated.  It is unclear why his fenofibrate was discontinued.  We will restart it at 145 mg daily.   # OSA:  Continue CPAP.  # Thyroid: TSH has been elevated.  He will bring this up with his PCP he scheduled to see them next week.  Current medicines are reviewed at length with [name] today.  [name] does not have concerns regarding medicines.  [name] following changes have been made:  Add fish oil  Labs/ tests ordered today include:   No orders of [name] defined types were placed in this encounter.    Disposition:   FU with Ophia Shamoon C. Oval Linsey, MD, [facility] in 1 year.  This note was written with [name] assistance of speech recognition software.  Please excuse any transcriptional errors.  Signed, Aleksandr Pellow C. Oval Linsey, MD, [facility]  11/29/2019 2:34 PM    [place]

## 2019-11-29 NOTE — Patient Instructions (Signed)
Medication Instructions:  RESTART FENOFIBRATE 145 MG DAILY   *If you need a refill on your cardiac medications before your next appointment, please call your pharmacy*  Lab Work: NONE   Testing/Procedures: NONE  Follow-Up At [facility], you and your health needs are our priority.  As part of our continuing mission to provide you with exceptional heart care, we have created designated Provider Care Teams.  These Care Teams include your primary Cardiologist (physician) and Advanced Practice Providers (APPs -  Physician Assistants and Nurse Practitioners) who all work together to provide you with the care you need, when you need it.  We recommend signing up for the patient portal called "MyChart".  Sign up information is provided on this After Visit Summary.  MyChart is used to connect with patients for Virtual Visits (Telemedicine).  Patients are able to view lab/test results, encounter notes, upcoming appointments, etc.  Non-urgent messages can be sent to your provider as well.   To learn more about what you can do with MyChart, go to [email or web address].    Your next appointment:   12 month(s)  You will receive a reminder letter in the mail two months in advance. If you don't receive a letter, please call our office to schedule the follow-up appointment.  The format for your next appointment:   In Person  Provider:   You may see Chilton Si, MD or one of the following Advanced Practice Providers on your designated Care Team:    Corine Shelter, PA-C  [place], [place]  Edd Fabian, [place]

## 2019-11-30 ENCOUNTER — Other Ambulatory Visit: Payer: Self-pay

## 2019-11-30 MED ORDER — METOPROLOL SUCCINATE ER 25 MG PO TB24: 25.0000 mg | ORAL_TABLET | Freq: Two times a day (BID) | ORAL | 3 refills | Status: DC

## 2019-12-04 NOTE — Addendum Note (Signed)
Addended by: Dorris Fetch on: 12/04/2019 04:37 PM   Modules accepted: Orders

## 2020-04-17 ENCOUNTER — Telehealth (INDEPENDENT_AMBULATORY_CARE_PROVIDER_SITE_OTHER): Payer: Medicare Other | Admitting: Adult Health

## 2020-04-17 DIAGNOSIS — Z9989 Dependence on other enabling machines and devices: Secondary | ICD-10-CM | POA: Diagnosis not present

## 2020-04-17 DIAGNOSIS — G4733 Obstructive sleep apnea (adult) (pediatric): Secondary | ICD-10-CM | POA: Diagnosis not present

## 2020-04-17 NOTE — Progress Notes (Signed)
PATIENT: CHARLOTTE BRAFFORD DOB: 1940/07/07  REASON FOR VISIT: follow up HISTORY FROM: patient  Virtual Visit via Video Note  I connected with [name] on 04/17/20 at  9:00 AM EST by a video enabled telemedicine application located remotely at [facility] and verified that I am speaking with the correct person using two identifiers who was located at their own home.   I discussed the limitations of evaluation and management by telemedicine and the availability of in person appointments. The patient expressed understanding and agreed to proceed.   PATIENT: DONALDSON RICHTER DOB: 09/29/1940  REASON FOR VISIT: follow up HISTORY FROM: patient  HISTORY OF PRESENT ILLNESS: Today 04/17/20:  Mr. Can is an 80 year old male with a history of obstructive sleep apnea on CPAP.  He reports that the CPAP works well for him.  He denies any new issues.  His download is below    HISTORY 04/18/19:  Mr. Andreoli is a 80 year old male with a history of obstructive sleep apnea on CPAP.  He returns today for follow-up.  His download indicates that he uses machine nightly for compliance of 100%.  He uses machine greater than 4 hours each night.  On average he uses his machine 7 hours and 20 minutes.  His residual AHI is 0.9 on 10 cm of water with EPR of 2.  Leak in the 95th percentile is 9.9 L/min.  Reports that the CPAP is working well for him.  He reports that he is having more fatigue but is under evaluation with his cardiologist.  REVIEW OF SYSTEMS: Out of a complete 14 system review of symptoms, the patient complains only of the following symptoms, and all other reviewed systems are negative.  See HPI  ALLERGIES: No Known Allergies  HOME MEDICATIONS: Outpatient Medications Prior to Visit  Medication Sig Dispense Refill  . atorvastatin (LIPITOR) 80 MG tablet Take 40-80 mg by mouth See admin instructions. Takes 80 mg by mouth daily on Monday, Wednesday, Fridays and  take 40 mg by mouth on Tue, Thurs, Sat, and Sun    . docusate sodium (COLACE) 100 MG capsule Take 200 mg by mouth at bedtime.    . fenofibrate (TRICOR) 145 MG tablet Take 1 tablet (145 mg total) by mouth daily. 90 tablet 3  . finasteride (PROSCAR) 5 MG tablet Take 5 mg by mouth every evening.     [name] Kitchen glimepiride (AMARYL) 2 MG tablet Take 2 mg by mouth daily with breakfast.    . levothyroxine (SYNTHROID) 50 MCG tablet Take 50-100 mcg by mouth See admin instructions. Take 100 mcg by mouth daily on Monday, Wednesday and Friday. Take 50 mcg by mouth daily on Tue, Thurs, Sat, and Sun    . Methylcellulose, Laxative, (CITRUCEL PO) Take 2 tablets by mouth daily.    . metoprolol succinate (TOPROL-XL) 25 MG 24 hr tablet Take 1 tablet (25 mg total) by mouth 2 (two) times daily. 180 tablet 3  . Omega-3 Fatty Acids (FISH OIL) 1000 MG CAPS Take 1 capsule (1,000 mg total) by mouth daily. 90 capsule 1  . omeprazole (PRILOSEC) 20 MG capsule Take 20 mg by mouth daily.    [name] Kitchen oxyCODONE (OXY IR/ROXICODONE) 5 MG immediate release tablet Take 1 tablet (5 mg total) by mouth every 4 (four) hours as needed for moderate pain or severe pain. (Patient taking differently: Take 5 mg by mouth daily. ) 60 tablet 0  . pioglitazone-metformin (ACTOPLUS MET) 15-500 MG per tablet Take 1 tablet  by mouth daily.     . tamsulosin (FLOMAX) 0.4 MG CAPS capsule Take 0.4 mg by mouth daily.      Facility-Administered Medications Prior to Visit  Medication Dose Route Frequency Provider Last Rate Last Admin  . incobotulinumtoxinA (XEOMIN) 100 units injection 400 Units  400 Units Intramuscular Q90 days Marcial Pacas, MD   400 Units at 12/15/17 1446    PAST MEDICAL HISTORY: Past Medical History:  Diagnosis Date  . 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 Barrett's esophagus with dysplasia   . Chronic lower back pain    DDD  . Closed patellar sleeve fracture of left knee   . Constipation   . Crush injury knee     bilateral  . Dysphagia    EGD with dilitation  . Dyspnea    walking long distance  . Dysrhythmia    thought pt. had a fib after surgery saw cardiology later and ok meds preventive per pt.  . Enlarged prostate    sees a urologist  . Exertional dyspnea 03/21/2019  . GERD (gastroesophageal reflux disease)    takes Omeprazole daily  . Hard of hearing   . Hyperlipidemia    takes Lipitor and Fish Oil daily  . Insomnia    d/t pain  . Joint pain   . Joint swelling   . Left tibial fracture   . Multiple duodenal ulcers   . Nocturia   . OSA on CPAP    cpap  . Pneumonia 2001  . Pure hypercholesterolemia 05/22/2019  . Type II diabetes mellitus ([place])    type 2  . Unsteady gait   . Urinary frequency   . Wears glasses     PAST SURGICAL HISTORY: Past Surgical History:  Procedure Laterality Date  . ANTERIOR CERVICAL DECOMP/DISCECTOMY FUSION  10/2010  . ANTERIOR LAT LUMBAR FUSION  12/02/2011   Procedure: ANTERIOR LATERAL LUMBAR FUSION 1 LEVEL;  Surgeon: Eustace Moore, MD;  Location: [facility];  Service: Neurosurgery;  Laterality: Left;  left lumbar three-four  . BACK SURGERY    . CATARACT EXTRACTION, BILATERAL Bilateral    about a month apart.   . COLONOSCOPY  04/2007  . ESOPHAGOGASTRODUODENOSCOPY    . ESOPHAGOGASTRODUODENOSCOPY  04/2007; 07/2008  . ESOPHAGOGASTRODUODENOSCOPY (EGD) WITH ESOPHAGEAL DILATION  "2-3 times"  . EXCISIONAL HEMORRHOIDECTOMY  ~ 1966  . FEMORAL REVISION Left 12/08/2016   Procedure: LEFT KNEE FEMORAL REVISION;  Surgeon: Gaynelle Arabian, MD;  Location: [facility];  Service: Orthopedics;  Laterality: Left;  Adductor Block  . FINGER SURGERY Right    pinky finger  . JOINT REPLACEMENT    . KNEE ARTHROTOMY Left 07/24/2018   Procedure: Left knee arthrotomy with scar excision;  Surgeon: Gaynelle Arabian, MD;  Location: [facility];  Service: Orthopedics;  Laterality: Left;  . KNEE ARTHROTOMY Left 05/02/2019   Procedure: Left knee arthrotomy; scar excision;  Surgeon: Gaynelle Arabian, MD;  Location: [facility];  Service: Orthopedics;  Laterality: Left;  44mn  . KNEE CLOSED REDUCTION Left 06/21/2016   Procedure: CLOSED MANIPULATION LEFT KNEE;  Surgeon: AGaynelle Arabian MD;  Location: [facility];  Service: Orthopedics;  Laterality: Left;  . KNEE CLOSED REDUCTION Left 02/28/2017   Procedure: CLOSED MANIPULATION KNEE;  Surgeon: AGaynelle Arabian MD;  Location: [facility];  Service: Orthopedics;  Laterality: Left;  . KNEE JOINT MANIPULATION  06/2009  . LAPAROSCOPIC CHOLECYSTECTOMY  02/2000  . LUMBAR FUSION  03/16/2012   Dr JRonnald Ramp .  ORIF TIBIA PLATEAU Left 09/24/2015   Procedure: OPEN REDUCTION INTERNAL FIXATION (ORIF) LEFT TIBIAL PLATEAU;  Surgeon: Gaynelle Arabian, MD;  Location: [facility];  Service: Orthopedics;  Laterality: Left;  . PARTIAL KNEE ARTHROPLASTY Left 07/30/2015   Procedure: UNICOMPARTMENTAL LEFT KNEE MEDIAL;  Surgeon: Gaynelle Arabian, MD;  Location: [facility];  Service: Orthopedics;  Laterality: Left;  . POSTERIOR LUMBAR FUSION  04/2015   rods inserted L2  . REFRACTIVE SURGERY Right 1980s  . SHOULDER OPEN ROTATOR CUFF REPAIR Bilateral 1990s  . TENNIS ELBOW RELEASE/NIRSCHEL PROCEDURE Right 1980s  . TOTAL KNEE ARTHROPLASTY Right 03/2009  . TOTAL KNEE ARTHROPLASTY WITH REVISION COMPONENTS Left 03/03/2016   Procedure: REVISION LEFT KNEE UNICOMPARTMENTAL ARTHROPLASTY TO TOTAL KNEE ARTHROPLASTY;  Surgeon: Gaynelle Arabian, MD;  Location: [facility];  Service: Orthopedics;  Laterality: Left;  requests 2hrs; Adductor Block    FAMILY HISTORY: Family History  Problem Relation Age of Onset  . Diabetes Brother   . Lung cancer Brother   . Stomach cancer Mother   . Lung cancer Father   . Lung cancer Brother   . Hodgkin's lymphoma Brother   . Lung cancer Brother     SOCIAL HISTORY: Social History   Socioeconomic History  . Marital status: Married    Spouse name: Not on file  . Number of children: 6  . Years of education: 37  . Highest education level: Bachelor's degree (e.g., BA, AB, BS)   Occupational History  . Occupation: Part-time office work  Tobacco Use  . Smoking status: Former Smoker    Packs/day: 1.50    Years: 15.00    Pack years: 22.50    Types: Cigarettes    Quit date: 1989    Years since quitting: 33.2  . Smokeless tobacco: Never Used  Vaping Use  . Vaping Use: Never used  Substance and Sexual Activity  . Alcohol use: Not Currently    Comment: beer occasionally  . Drug use: No  . Sexual activity: Not Currently  Other Topics Concern  . Not on file  Social History Narrative   Lives at home with his wife.   Right-handed.   3 cups caffeine per day.   Social Determinants of Health   Financial Resource Strain: Not on file  Food Insecurity: Not on file  Transportation Needs: Not on file  Physical Activity: Not on file  Stress: Not on file  Social Connections: Not on file  Intimate Partner Violence: Not on file      PHYSICAL EXAM Generalized: Well developed, in no acute distress   Neurological examination  Mentation: Alert oriented to time, place, history taking. Follows all commands speech and language fluent Cranial nerve II-XII:Extraocular movements were full. Facial symmetry noted. uvula tongue midline. Head turning and shoulder shrug  were normal and symmetric. Motor: Good strength throughout subjectively per patient Sensory: Sensory testing is intact to soft touch on all 4 extremities subjectively per patient Coordination: Cerebellar testing reveals good finger-nose-finger  Gait and station: Patient is able to stand from a seated position. gait is normal.  Reflexes: UTA  DIAGNOSTIC DATA (LABS, IMAGING, TESTING) - I reviewed patient records, labs, notes, testing and imaging myself where available.  Lab Results  Component Value Date   WBC 9.5 05/03/2019   HGB 11.4 (L) 05/03/2019   HCT 35.2 (L) 05/03/2019   MCV 104.5 (H) 05/03/2019   PLT 171 05/03/2019      Component Value Date/Time   NA 137 11/22/2019 0828   K 4.4 11/22/2019  8527  CL 102 11/22/2019 0828   CO2 22 11/22/2019 0828   GLUCOSE 176 (H) 11/22/2019 0828   GLUCOSE 260 (H) 05/03/2019 0310   BUN 16 11/22/2019 0828   CREATININE 0.92 11/22/2019 0828   CALCIUM 9.1 11/22/2019 0828   PROT 5.9 (L) 11/22/2019 0828   ALBUMIN 4.0 11/22/2019 0828   AST 15 11/22/2019 0828   ALT 19 11/22/2019 0828   ALKPHOS 64 11/22/2019 0828   BILITOT 1.0 11/22/2019 0828   GFRNONAA 79 11/22/2019 0828   GFRAA 91 11/22/2019 0828   Lab Results  Component Value Date   CHOL 167 11/22/2019   HDL 36 (L) 11/22/2019   LDLCALC 91 11/22/2019   TRIG 234 (H) 11/22/2019   CHOLHDL 4.6 11/22/2019   Lab Results  Component Value Date   HGBA1C 6.1 (H) 04/24/2019   No results found for: VITAMINB12 Lab Results  Component Value Date   TSH 4.710 (H) 10/06/2016      ASSESSMENT AND PLAN 80 y.o. year old male  has a past medical history of Arthritis, Barrett's esophagus with dysplasia, Chronic lower back pain, Closed patellar sleeve fracture of left knee, Constipation, Crush injury knee, Dysphagia, Dyspnea, Dysrhythmia, Enlarged prostate, Exertional dyspnea (03/21/2019), GERD (gastroesophageal reflux disease), Hard of hearing, Hyperlipidemia, Insomnia, Joint pain, Joint swelling, Left tibial fracture, Multiple duodenal ulcers, Nocturia, OSA on CPAP, Pneumonia (2001), Pure hypercholesterolemia (05/22/2019), Type II diabetes mellitus ([place]), Unsteady gait, Urinary frequency, and Wears glasses. here with:  OSA on CPAP  . CPAP compliance excellent . Residual AHI is good . Encouraged patient to continue using CPAP nightly and > 4 hours each night . F/U in 1 year or sooner if needed  I spent 20 minutes of face-to-face and non-face-to-face time with patient.  This included previsit chart review, lab review, study review, order entry, electronic health record documentation, patient education.  Ward Givens, MSN, NP-C 04/17/2020, 9:19 AM [facility] [address] [place], [place] [address] [phone]

## 2020-07-17 ENCOUNTER — Other Ambulatory Visit: Payer: Self-pay | Admitting: Cardiovascular Disease

## 2020-11-02 IMAGING — CT CT L SPINE W/O CM
1 of 6 series · 6 of 14 positions shown, 8 images · non-contrast
Comparison: Plain films lumbar spine 01/16/2019 and 04/24/2019. MRI
lumbar spine 01/12/2019. CT lumbar spine 03/20/2015.

CLINICAL DATA: History of prior lumbar surgery most recently
10/19/2018 when the patient underwent L4-5 fusion. Recurrent low
back pain since October 2018.

EXAM:
CT LUMBAR SPINE WITHOUT CONTRAST
TECHNIQUE: Multidetector CT imaging of the lumbar spine was performed without
intravenous contrast administration. Multiplanar CT image
reconstructions were also generated.

[Series 3: l spine soft · axial · 0.34mm/px · z∈[-270,-99]mm · 6 of 81 slices shown, 8 images]
[im 12/81  soft-tissue]
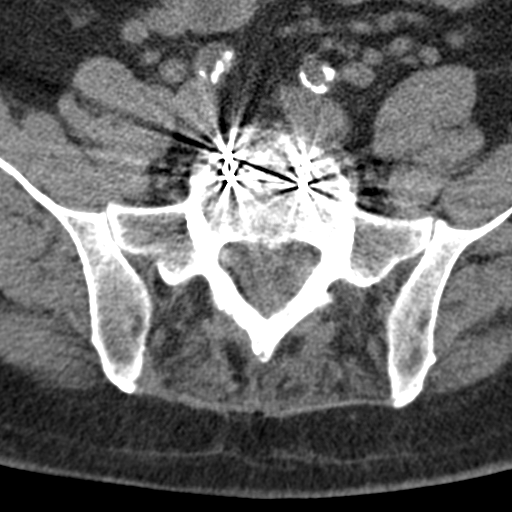
[im 12/81  bone]
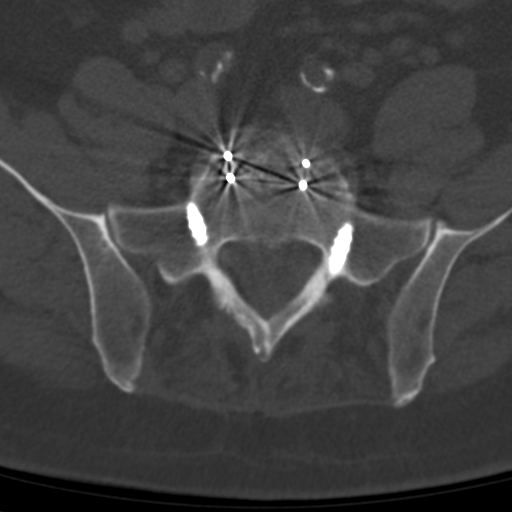
[im 23/81  bone]
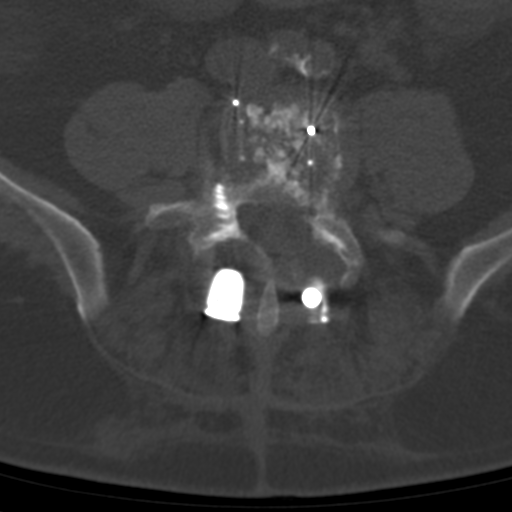
[im 35/81  bone]
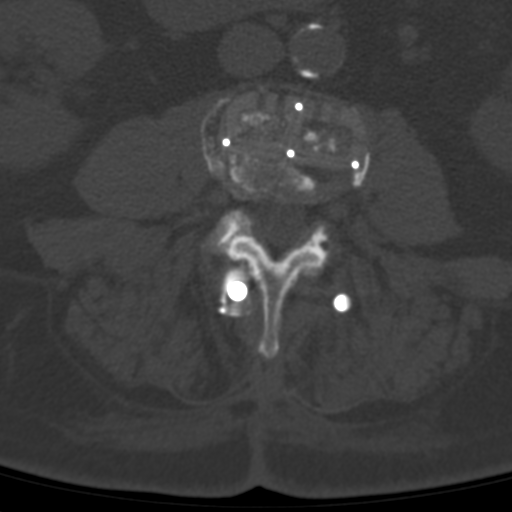
[im 46/81  bone]
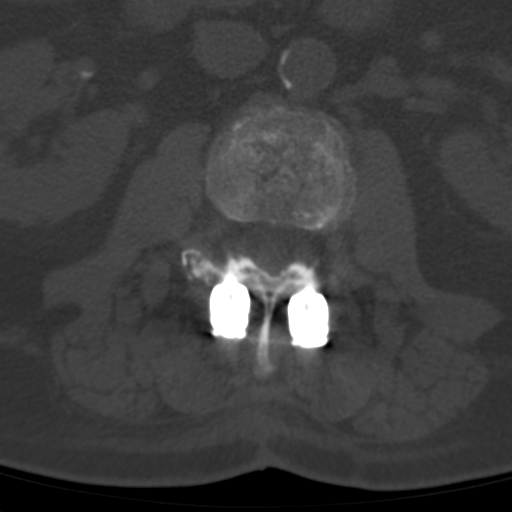
[im 58/81  soft-tissue]
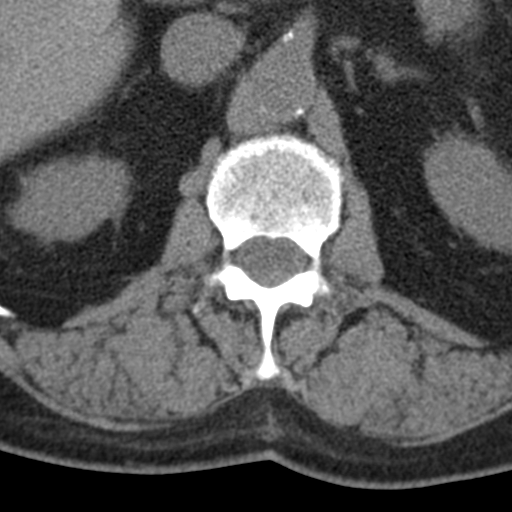
[im 58/81  bone]
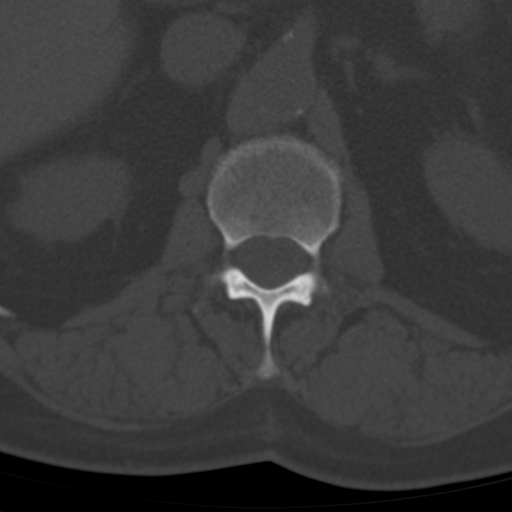
[im 69/81  bone]
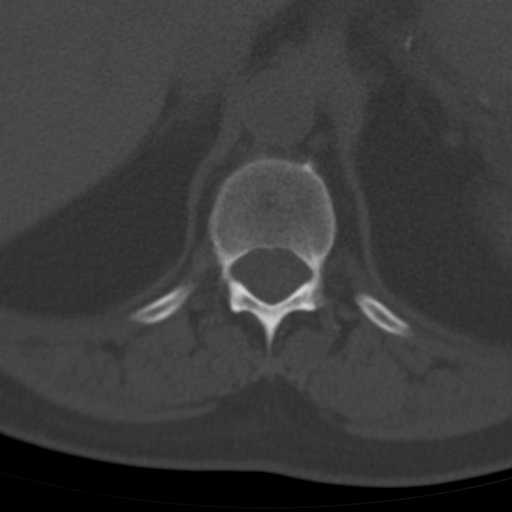

[6 of 14 positions shown; findings below may reference images not displayed]

FINDINGS: Segmentation: Standard.

Alignment: Maintained.

Vertebrae: No acute fracture or focal pathologic process. The
patient is status post L2-S1 fusion. Pedicle screws are in place at
all levels. Stabilization bars are seen at L2-3 and from L4-S1.

Paraspinal and other soft tissues: Atherosclerosis. Otherwise
negative.

Disc levels: T11-12: Negative.

T12-L1: Mild facet degenerative change.  Otherwise negative.

L1-2: Mild facet degenerative change.  Otherwise negative.

L2-3: Fusion hardware is intact and well positioned. There is solid
bridging bone across the disc interspace and posterior elements. No
stenosis.

L3-4: Status post fusion with solid bridging bone across the disc
interspace and facets. No stenosis.

L4-5: Status post fusion and partial facetectomies. Hardware is
intact and well positioned. No loosening or other complicating
feature. There is partial bridging bone across the disc interspace.
The central canal and foramina are open.

L5-S1: Status post fusion with solid bridging bone across the disc
interspace and posterior elements. No stenosis.
IMPRESSION: The central canal and foramina are open at all levels. No acute
abnormality.

The patient is status post L2-S1 fusion, most recent fusion is at
L4-5. There is partial bridging bone across the L4-5 disc
interspace. The L2-4 and L5-S1 levels are solidly fused. Hardware is
appropriately positioned at all levels without loosening or other
complicating feature.

## 2020-12-14 ENCOUNTER — Other Ambulatory Visit: Payer: Self-pay | Admitting: Cardiovascular Disease

## 2020-12-15 NOTE — Telephone Encounter (Signed)
Rx(s) sent to pharmacy electronically.  

## 2020-12-19 ENCOUNTER — Ambulatory Visit (HOSPITAL_BASED_OUTPATIENT_CLINIC_OR_DEPARTMENT_OTHER): Payer: Medicare Other | Admitting: Cardiovascular Disease

## 2020-12-22 ENCOUNTER — Ambulatory Visit (INDEPENDENT_AMBULATORY_CARE_PROVIDER_SITE_OTHER): Payer: Medicare Other | Admitting: Cardiovascular Disease

## 2020-12-22 ENCOUNTER — Other Ambulatory Visit: Payer: Self-pay

## 2020-12-22 ENCOUNTER — Encounter (HOSPITAL_BASED_OUTPATIENT_CLINIC_OR_DEPARTMENT_OTHER): Payer: Self-pay | Admitting: Cardiovascular Disease

## 2020-12-22 VITALS — BP 132/68 | HR 64 | Ht 67.0 in | Wt 216.6 lb

## 2020-12-22 DIAGNOSIS — Z9989 Dependence on other enabling machines and devices: Secondary | ICD-10-CM

## 2020-12-22 DIAGNOSIS — R0602 Shortness of breath: Secondary | ICD-10-CM | POA: Diagnosis not present

## 2020-12-22 DIAGNOSIS — G4733 Obstructive sleep apnea (adult) (pediatric): Secondary | ICD-10-CM | POA: Diagnosis not present

## 2020-12-22 DIAGNOSIS — E781 Pure hyperglyceridemia: Secondary | ICD-10-CM

## 2020-12-22 DIAGNOSIS — E7800 Pure hypercholesterolemia, unspecified: Secondary | ICD-10-CM

## 2020-12-22 DIAGNOSIS — I480 Paroxysmal atrial fibrillation: Secondary | ICD-10-CM | POA: Diagnosis not present

## 2020-12-22 DIAGNOSIS — R0609 Other forms of dyspnea: Secondary | ICD-10-CM

## 2020-12-22 NOTE — Assessment & Plan Note (Signed)
Continue atorvastatin, fenofibrate, and fish oil.

## 2020-12-22 NOTE — Assessment & Plan Note (Signed)
He had an episode of postoperative atrial fibrillation.  He has not had any recurrent.  Therefore he is not on anticoagulation.  Metoprolol was on his list but he has not been able to get it from the pharmacy lately.  EKG today reveals sinus rhythm at 64.  We will take the metoprolol off his med list.

## 2020-12-22 NOTE — Assessment & Plan Note (Signed)
Triglycerides are improving on statin, fenofibrate and fish oil.  Continue working on diet and exercise.

## 2020-12-22 NOTE — Assessment & Plan Note (Signed)
[name] has consistently complained of shortness of breath for years.  He had an echo that was unremarkable other than mild diastolic dysfunction and a stress test that was negative.  He has tried increasing his exercise and has not noticed any improvement.  He gets short of breath with minimal activity such as washing the car.  He does report a remote smoking history.  We will refer him to pulmonary to see if there is another explanation for his exertional dyspnea.

## 2020-12-22 NOTE — Assessment & Plan Note (Signed)
Continue CPAP.  He uses it regularly. °

## 2020-12-22 NOTE — Patient Instructions (Addendum)
Medication Instructions:  Your physician recommends that you continue on your current medications as directed. Please refer to the Current Medication list given to you today.   *If you need a refill on your cardiac medications before your next appointment, please call your pharmacy*  Lab Work: NONE   Testing/Procedures: NONE   Follow-Up: At [facility], you and your health needs are our priority.  As part of our continuing mission to provide you with exceptional heart care, we have created designated Provider Care Teams.  These Care Teams include your primary Cardiologist (physician) and Advanced Practice Providers (APPs -  Physician Assistants and Nurse Practitioners) who all work together to provide you with the care you need, when you need it.  We recommend signing up for the patient portal called "MyChart".  Sign up information is provided on this After Visit Summary.  MyChart is used to connect with patients for Virtual Visits (Telemedicine).  Patients are able to view lab/test results, encounter notes, upcoming appointments, etc.  Non-urgent messages can be sent to your provider as well.   To learn more about what you can do with MyChart, go to [email or web address].    Your next appointment:   6 month(s)  The format for your next appointment:   In Person  Provider:   Chilton Si, MD   You have been referred to  Where: [facility] Address: [address] [place] [address] Phone: [phone]   IF YOU DO NOT HEAR FROM THE OFFICE IN 2 WEEKS CALL THEM AT THE NUMBER ABOVE    Other Instructions   MONITOR YOUR BLOOD PRESSURE AT HOME  CALL THE OFFICE IF IT IS NOT CONSISTENTLY BELOW 130/80

## 2020-12-22 NOTE — Progress Notes (Signed)
Cardiology Office Note   Date:  12/22/2020   ID:  [name], [name] 03-07-1940, MRN [id number]  PCP:  [name], MD  Cardiologist:   [name], MD   No chief complaint on file.    History of Present Illness: [name] is a 80 y.o. male with paroxysmal atrial fibrillation, diabetes, hyperlipidemia, and OSA on CPAP, who presents for follow-up. [name] was initially seen 06/21/16 when he developed postoperative atrial fibrillation. He underwent closed manipulation of the left knee and was noted to be in atrial fibrillation in the PACU.  This was his first known episode of atrial fibrillation.  Prior to the episode he noted 2 weeks of exertional dyspnea. He was relatively asymptomatic and was started on metoprolol and Eliquis.  He followed up in atrial fibrillation clinic and was feeling well.  TSH was mildly elevated but T4 was within the normal range.  He was subsequetly started on low dose levothyroxine.  He had an echo 07/02/16 that revealed LVEF 60-65% with grade 1 diastolic dysfunction.  He wore a 30-day event monitor 04/2017 that revealed no atrial fibrillation.  Given that his atrial fibrillation was perioperative Eliquis was discontinued and he was started on aspirin.   [name] reported exertional dyspnea.  He was referred for a Lexiscan Myoview 04/2019 that revealed LVEF 60% and no ischemia.  Amiodarone was discontinued.  He had another knee surgery 04/2019.  He had a lot of scar tissue removed.  He has been struggling with L knee pain and an inability to straighten it.  At his last appointment he is continued to be short of breath, but was thought to be mostly due to deconditioning and lack of physical activity.  He tried increasing his exercise but is limited by left knee pain.  He continues to have shortness of breath with minimal activity such as washing his car.  He has no chest pain or pressure.  He is able to do some golfing but not much else.  He reports a remote  smoking history and smoked for several years.  He has been working on his diet has been able to lose little weight.  He notes that at home his blood pressures have been well have been controlled.,  Though he does not check it regularly.   Past Medical History:  Diagnosis Date   Arthritis    "hands; probably in my back too" (08/28/2015); osteo related   Barrett's esophagus with dysplasia    Chronic lower back pain    DDD   Closed patellar sleeve fracture of left knee    Constipation    Crush injury knee    bilateral   Dysphagia    EGD with dilitation   Dyspnea    walking long distance   Dysrhythmia    thought pt. had a fib after surgery saw cardiology later and ok meds preventive per pt.   Enlarged prostate    sees a urologist   Exertional dyspnea 03/21/2019   GERD (gastroesophageal reflux disease)    takes Omeprazole daily   Hard of hearing    Hyperlipidemia    takes Lipitor and Fish Oil daily   Insomnia    d/t pain   Joint pain    Joint swelling    Left tibial fracture    Multiple duodenal ulcers    Nocturia    OSA on CPAP    cpap   Pneumonia 2001   Pure hypercholesterolemia 05/22/2019   Type  II diabetes mellitus ([place])    type 2   Unsteady gait    Urinary frequency    Wears glasses     Past Surgical History:  Procedure Laterality Date   ANTERIOR CERVICAL DECOMP/DISCECTOMY FUSION  10/2010   ANTERIOR LAT LUMBAR FUSION  12/02/2011   Procedure: ANTERIOR LATERAL LUMBAR FUSION 1 LEVEL;  Surgeon: Eustace Moore, MD;  Location: [facility];  Service: Neurosurgery;  Laterality: Left;  left lumbar three-four   BACK SURGERY     CATARACT EXTRACTION, BILATERAL Bilateral    about a month apart.    COLONOSCOPY  04/2007   ESOPHAGOGASTRODUODENOSCOPY     ESOPHAGOGASTRODUODENOSCOPY  04/2007; 07/2008   ESOPHAGOGASTRODUODENOSCOPY (EGD) WITH ESOPHAGEAL DILATION  "2-3 times"   EXCISIONAL HEMORRHOIDECTOMY  ~ [place] Left 12/08/2016   Procedure: LEFT KNEE FEMORAL  REVISION;  Surgeon: Gaynelle Arabian, MD;  Location: [facility];  Service: Orthopedics;  Laterality: Left;  Adductor Block   FINGER SURGERY Right    pinky finger   JOINT REPLACEMENT     KNEE ARTHROTOMY Left 07/24/2018   Procedure: Left knee arthrotomy with scar excision;  Surgeon: Gaynelle Arabian, MD;  Location: [facility];  Service: Orthopedics;  Laterality: Left;   KNEE ARTHROTOMY Left 05/02/2019   Procedure: Left knee arthrotomy; scar excision;  Surgeon: Gaynelle Arabian, MD;  Location: [facility];  Service: Orthopedics;  Laterality: Left;  22mn   KNEE CLOSED REDUCTION Left 06/21/2016   Procedure: CLOSED MANIPULATION LEFT KNEE;  Surgeon: AGaynelle Arabian MD;  Location: [facility];  Service: Orthopedics;  Laterality: Left;   KNEE CLOSED REDUCTION Left 02/28/2017   Procedure: CLOSED MANIPULATION KNEE;  Surgeon: AGaynelle Arabian MD;  Location: [facility];  Service: Orthopedics;  Laterality: Left;   KNEE JOINT MANIPULATION  06/2009   LAPAROSCOPIC CHOLECYSTECTOMY  02/2000   LUMBAR FUSION  03/16/2012   Dr JRonnald Ramp  ORIF TIBIA PLATEAU Left 09/24/2015   Procedure: OPEN REDUCTION INTERNAL FIXATION (ORIF) LEFT TIBIAL PLATEAU;  Surgeon: FGaynelle Arabian MD;  Location: [facility];  Service: Orthopedics;  Laterality: Left;   PARTIAL KNEE ARTHROPLASTY Left 07/30/2015   Procedure: UNICOMPARTMENTAL LEFT KNEE MEDIAL;  Surgeon: FGaynelle Arabian MD;  Location: [facility];  Service: Orthopedics;  Laterality: Left;   POSTERIOR LUMBAR FUSION  04/2015   rods inserted L2   REFRACTIVE SURGERY Right 1980s   SHOULDER OPEN ROTATOR CUFF REPAIR Bilateral 1990s   TENNIS ELBOW RELEASE/NIRSCHEL PROCEDURE Right 1980s   TOTAL KNEE ARTHROPLASTY Right 03/2009   TOTAL KNEE ARTHROPLASTY WITH REVISION COMPONENTS Left 03/03/2016   Procedure: REVISION LEFT KNEE UNICOMPARTMENTAL ARTHROPLASTY TO TOTAL KNEE ARTHROPLASTY;  Surgeon: FGaynelle Arabian MD;  Location: [facility];  Service: Orthopedics;  Laterality: Left;  requests 2hrs; Adductor Block     Current Outpatient Medications   Medication Sig Dispense Refill   aspirin EC 81 MG tablet Take 81 mg by mouth daily. Swallow whole.     atorvastatin (LIPITOR) 80 MG tablet Take 40-80 mg by mouth See admin instructions. Takes 80 mg by mouth daily on Monday, Wednesday, Fridays and take 40 mg by mouth on Tue, Thurs, Sat, and Sun     docusate sodium (COLACE) 100 MG capsule Take 200 mg by mouth at bedtime.     fenofibrate (TRICOR) 145 MG tablet Take 1 tablet by mouth once daily 30 tablet 0   finasteride (PROSCAR) 5 MG tablet Take 5 mg by mouth every evening.      glimepiride (AMARYL) 2 MG tablet Take 2 mg by mouth  daily with breakfast.     levothyroxine (SYNTHROID) 50 MCG tablet Take 50-100 mcg by mouth See admin instructions. Take 100 mcg by mouth daily on Monday, Wednesday and Friday. Take 50 mcg by mouth daily on Tue, Thurs, Sat, and Sun     methocarbamol (ROBAXIN) 500 MG tablet Take 1 tablet by mouth daily.     Methylcellulose, Laxative, (CITRUCEL PO) Take 2 tablets by mouth daily.     Omega-3 Fatty Acids (FISH OIL) 1000 MG CAPS Take 1 capsule (1,000 mg total) by mouth daily. 90 capsule 1   omeprazole (PRILOSEC) 20 MG capsule Take 20 mg by mouth daily.     oxyCODONE (OXY IR/ROXICODONE) 5 MG immediate release tablet Take 1 tablet (5 mg total) by mouth every 4 (four) hours as needed for moderate pain or severe pain. (Patient taking differently: Take 5 mg by mouth daily.) 60 tablet 0   pioglitazone-metformin (ACTOPLUS MET) 15-500 MG per tablet Take 1 tablet by mouth daily.      Saw Palmetto 450 MG CAPS Take 1 capsule by mouth daily.     Turmeric (QC TUMERIC COMPLEX PO) Take 2 tablets by mouth daily.     Current Facility-Administered Medications  Medication Dose Route Frequency Provider Last Rate Last Admin   incobotulinumtoxinA (XEOMIN) 100 units injection 400 Units  400 Units Intramuscular Q90 days Marcial Pacas, MD   400 Units at 12/15/17 1446    Allergies:   Patient has no known allergies.    Social History:  The patient   reports that he quit smoking about 33 years ago. His smoking use included cigarettes. He has a 22.50 pack-year smoking history. He has never used smokeless tobacco. He reports that he does not currently use alcohol. He reports that he does not use drugs.   Family History:  The patient's family history includes Diabetes in his brother; Hodgkin's lymphoma in his brother; Lung cancer in his brother, brother, brother, and father; Stomach cancer in his mother.    ROS:  Please see the history of present illness.   Otherwise, review of systems are positive for none.   All other systems are reviewed and negative.    PHYSICAL EXAM: VS:  BP 132/68   Pulse 64   Ht 5' 7"  (1.702 m)   Wt 216 lb 9.6 oz (98.2 kg)   BMI 33.92 kg/m  , BMI Body mass index is 33.92 kg/m. GENERAL:  Well appearing HEENT: Pupils equal round and reactive, fundi not visualized, oral mucosa unremarkable NECK:  No jugular venous distention, waveform within normal limits, carotid upstroke brisk and symmetric, no bruits LUNGS:  Clear to auscultation bilaterally HEART:  RRR.  PMI not displaced or sustained,S1 and S2 within normal limits, no S3, no S4, no clicks, no rubs, no murmurs ABD:  Flat, positive bowel sounds normal in frequency in pitch, no bruits, no rebound, no guarding, no midline pulsatile mass, no hepatomegaly, no splenomegaly EXT:  2+ radial pulses.  1+ DP/TP bilaterally.  No edema, no cyanosis no clubbing SKIN:  No rashes no nodules NEURO:  Cranial nerves II through XII grossly intact, motor grossly intact throughout PSYCH:  Cognitively intact, oriented to person place and time   EKG:  EKG is ordered today. 03/21/2019: Sinus bradycardia.  Rate 54 bpm.  First-degree AV block. 11/29/19: Sinus bradycardia.  Rate 58 bpm. 12/22/20: Sinus rhythm.  Rate 64 bpm.    Lexiscan Myoview 04/2019: Baseline EKG showed NSR with early repolarization with 6m of ST elevation in inferior leads  and V6. No change during Lexiscan  infusion. The left ventricular ejection fraction is normal (55-65%). Nuclear stress EF: 60%. The study is normal. This is a low risk study.    Echo 07/02/16: Study Conclusions   - Left ventricle: The cavity size was normal. Wall thickness was   increased in a pattern of mild LVH. Systolic function was normal.   The estimated ejection fraction was in the range of 60% to 65%.   Wall motion was normal; there were no regional wall motion   abnormalities. Doppler parameters are consistent with abnormal   left ventricular relaxation (grade 1 diastolic dysfunction). - Aortic valve: There was no stenosis. - Mitral valve: Mildly calcified annulus. There was trivial   regurgitation. - Right ventricle: The cavity size was normal. Systolic function   was normal. - Tricuspid valve: Peak RV-RA gradient (S): 26 mm Hg. - Pulmonary arteries: PA peak pressure: 29 mm Hg (S). - Inferior vena cava: The vessel was normal in size. The   respirophasic diameter changes were in the normal range (>= 50%),   consistent with normal central venous pressure.   Impressions:   - Normal LV size with mild LV hypertrophy, EF 60-65%. Normal RV   size and systolic function. No signficant valvular abnormalities.  30 Day Event Monitor 02/2017:  Quality: Fair.  Baseline artifact. Predominant rhythm: sinus   Rare PVCs No arrhythmias noted    Recent Labs: No results found for requested labs within last 8760 hours.   03/29/18: Total cholesterol 172, triglycerides  321, HDL 36, LDL 72   01/15/2019: Total cholesterol 136, triglycerides 117, HDL 46  05/14/2019: WBC 6.5, hemoglobin 12.1, hematocrit sodium 142, potassium 4.6, BUN 18, creatinine 1.19 AST 19, ALT 15 Hemoglobin A1c 6.1% Total cholesterol 141, triglycerides 195, HDL 34, LDL 74 TSH 8.51  Lipid Panel    Component Value Date/Time   CHOL 167 11/22/2019 0828   TRIG 234 (H) 11/22/2019 0828   HDL 36 (L) 11/22/2019 0828   CHOLHDL 4.6 11/22/2019 0828    LDLCALC 91 11/22/2019 0828      Wt Readings from Last 3 Encounters:  12/22/20 216 lb 9.6 oz (98.2 kg)  11/29/19 222 lb 3.2 oz (100.8 kg)  05/22/19 223 lb (101.2 kg)      ASSESSMENT AND PLAN:  Paroxysmal atrial fibrillation ([place]) He had an episode of postoperative atrial fibrillation.  He has not had any recurrent.  Therefore he is not on anticoagulation.  Metoprolol was on his list but he has not been able to get it from the pharmacy lately.  EKG today reveals sinus rhythm at 64.  We will take the metoprolol off his med list.  OSA on CPAP Continue CPAP.  He uses it regularly.  Exertional dyspnea [name] has consistently complained of shortness of breath for years.  He had an echo that was unremarkable other than mild diastolic dysfunction and a stress test that was negative.  He has tried increasing his exercise and has not noticed any improvement.  He gets short of breath with minimal activity such as washing the car.  He does report a remote smoking history.  We will refer him to pulmonary to see if there is another explanation for his exertional dyspnea.  Hypertriglyceridemia Triglycerides are improving on statin, fenofibrate and fish oil.  Continue working on diet and exercise.  Pure hypercholesterolemia Continue atorvastatin, fenofibrate, and fish oil.   Current medicines are reviewed at length with the patient today.  The patient does not  have concerns regarding medicines.  The following changes have been made:  Add fish oil  Labs/ tests ordered today include:   Orders Placed This Encounter  Procedures   Ambulatory referral to Pulmonology   EKG 12-Lead      Disposition:   FU with Chantay Whitelock C. Oval Linsey, MD, [facility] in 1 year.  This note was written with the assistance of speech recognition software.  Please excuse any transcriptional errors.  Signed, Kiandria Clum C. Oval Linsey, MD, [facility]  12/22/2020 3:48 PM    [place]

## 2020-12-25 ENCOUNTER — Telehealth (HOSPITAL_BASED_OUTPATIENT_CLINIC_OR_DEPARTMENT_OTHER): Payer: Self-pay | Admitting: Cardiovascular Disease

## 2020-12-25 NOTE — Telephone Encounter (Signed)
Spoke with patient regarding the Tuesday 01/06/21 9:30 am appointment with Dr. Marchelle Gearing  at [facility] [address]----phone [phone].  Arrival time is 9:15 am for check in---patient voiced his understanding.

## 2021-01-06 ENCOUNTER — Other Ambulatory Visit: Payer: Self-pay

## 2021-01-06 ENCOUNTER — Encounter: Payer: Self-pay | Admitting: Internal Medicine

## 2021-01-06 ENCOUNTER — Ambulatory Visit (INDEPENDENT_AMBULATORY_CARE_PROVIDER_SITE_OTHER): Payer: Medicare Other | Admitting: Internal Medicine

## 2021-01-06 VITALS — BP 122/74 | HR 65 | Temp 97.9°F | Ht 67.0 in | Wt 216.8 lb

## 2021-01-06 DIAGNOSIS — R053 Chronic cough: Secondary | ICD-10-CM

## 2021-01-06 DIAGNOSIS — Z87898 Personal history of other specified conditions: Secondary | ICD-10-CM

## 2021-01-06 DIAGNOSIS — R0609 Other forms of dyspnea: Secondary | ICD-10-CM | POA: Diagnosis not present

## 2021-01-06 DIAGNOSIS — Z87891 Personal history of nicotine dependence: Secondary | ICD-10-CM

## 2021-01-06 LAB — CBC WITH DIFFERENTIAL/PLATELET
Basophils Absolute: 0.1 10*3/uL (ref 0.0–0.1)
Basophils Relative: 1 % (ref 0.0–3.0)
Eosinophils Absolute: 0.3 10*3/uL (ref 0.0–0.7)
Eosinophils Relative: 6.2 % — ABNORMAL HIGH (ref 0.0–5.0)
HCT: 37.5 % — ABNORMAL LOW (ref 39.0–52.0)
Hemoglobin: 13 g/dL (ref 13.0–17.0)
Lymphocytes Relative: 21.4 % (ref 12.0–46.0)
Lymphs Abs: 1.2 10*3/uL (ref 0.7–4.0)
MCHC: 34.8 g/dL (ref 30.0–36.0)
MCV: 101.4 fl — ABNORMAL HIGH (ref 78.0–100.0)
Monocytes Absolute: 0.6 10*3/uL (ref 0.1–1.0)
Monocytes Relative: 10.3 % (ref 3.0–12.0)
Neutro Abs: 3.4 10*3/uL (ref 1.4–7.7)
Neutrophils Relative %: 61.1 % (ref 43.0–77.0)
Platelets: 244 10*3/uL (ref 150.0–400.0)
RBC: 3.69 Mil/uL — ABNORMAL LOW (ref 4.22–5.81)
RDW: 13.5 % (ref 11.5–15.5)
WBC: 5.5 10*3/uL (ref 4.0–10.5)

## 2021-01-06 NOTE — Patient Instructions (Addendum)
ICD-10-CM   1. DOE (dyspnea on exertion)  R06.09     2. Chronic cough  R05.3     3. History of wheezing  Z87.898     4. Former heavy cigarette smoker (20-39 per day)  Z87.891       Reasons for shortness of breath can be various.  We need to initiate work-up to narrow the possibilities.  Some possibilities include just physical deconditioning and stiff heart muscle but need to rule out pulmonary fibrosis or allergic asthma or emphysema/COPD any problems related to amiodarone in the lung  Plan - Do high-resolution CT chest supine and prone - Do full pulmonary function test - Do breathing test called feNO (exhaled NO)  - Do CBC with differential, blood IgE and RAST allergy panel today  Follow-up - Return to see nurse practitioner for a video visit or face-to-face visit to discuss test results in the next few weeks

## 2021-01-06 NOTE — Progress Notes (Signed)
OV 01/06/2021  Subjective:  Patient ID: [name], male , DOB: 1940/03/13 , age 80 y.o. , MRN: [id number] , ADDRESS: [place] [address] PCP [name], MD Patient Care Team: [name], MD as PCP - General (Internal Medicine) [name], MD as PCP - Cardiology (Cardiology) Gaynelle Arabian, MD as Consulting Physician (Orthopedic Surgery)  This Provider for this visit: Treatment Team:  Attending Provider: Brand Males, MD    01/06/2021 -   Chief Complaint  Patient presents with   Consult    Pt is being referred due to SOB. Pt states his SOB is worse with exertion but can have some SOB even at rest.     HPI [name] 80 y.o. -80 year old male referred by Dr. [name].  Whose note from 12/22/2020 I reviewed problems for shortness of breath with exertion and cough and wheezing.  He is a remote greater than 20 pack smoker.  He tells me that has had insidious onset of shortness of breath for over a year.  Is unable to quantify further.  It slightly progressive since it started.  Moderate intensity no associated chest pain.  He is also had intermittent wheezing particularly in the daytime but never during sleep associated with this for the same amount of time.  For the last month or 2 is also had a chronic dry cough.  The cough does not wake him up in the night but is present during the daytime.  He is not on ACE inhibitor.  He is on amiodarone.  He had a normal cardiac stress test.  He has diabetes.  He also has sleep apnea for which he uses CPAP.  In 2012 he was in the ER for pneumonia and kept overnight.  Visualized that CT scan of the chest and shows bilateral lower lobe atelectasis/infiltrates.  No further follow-up imaging available.    [id number] Rest of the history as below.   June 2022 he saw ENT at [place].  This was for otitis externa.  And this is past medical history is listed as diabetes and back  surgery.  Medication review shows amiodarone, Lipitor, diabetes medications, Flomax, Synthroid, gabapentin and PPI amongst others    He is obese He has OSA and Remote smoker Grade 1 diastolic dysfunction Normal stress test 2021    His last set of labs was a year ago he had hyperglycemia at 176 blood sugar.  Creatinine normal.  He has seen Dr. [name]  Visit February 2021 echocardiogram read by Dr. Golden Hurter that shows grade 1 diastolic dysfunction but otherwise normal.  Then in March 2020 when he had a normal cardiac stress test nuclear medicine.  In 2017 there was a CPAP titration study that shows AHI of 12.1.  Simple office walk 185 feet x  3 laps goal with forehead probe 01/06/2021    O2 used ra   Number laps completed Avg limp with left leg   Comments about pace 3   Resting Pulse Ox/HR 100% and 65/min   Final Pulse Ox/HR 96% and 97/min   Desaturated </= 88% no   Desaturated <= 3% points Yes, 4   Got Tachycardic >/= 90/min yes   Symptoms at end of test Moderate to severe dyspea   Miscellaneous comments bese    CT angio Sept 2012 - visualized -  Comparison:  Chest radiograph performed 10/30/2010   Findings:  There is no evidence of pulmonary embolus.  Mild patchy opacities are noted at the lung bases; this raises  suspicion for pneumonia, given additional vague peribronchial  opacities within the right lower lobe and peribronchial thickening.  No significant pleural effusion or pneumothorax is seen.  No masses  are identified; no abnormal focal contrast enhancement is seen.   Scattered small periaortic, right paratracheal, aortopulmonary and  subcarinal nodes are seen, without significant mediastinal  lymphadenopathy.  No pericardial effusion is identified.  The great  vessels are unremarkable in appearance.  No axillary  lymphadenopathy is seen.  The thyroid gland is unremarkable in  appearance.    PFT  No flowsheet data found.     has a past  medical history of Arthritis, Barrett's esophagus with dysplasia, Chronic lower back pain, Closed patellar sleeve fracture of left knee, Constipation, Crush injury knee, Dysphagia, Dyspnea, Dysrhythmia, Enlarged prostate, Exertional dyspnea (03/21/2019), GERD (gastroesophageal reflux disease), Hard of hearing, Hyperlipidemia, Insomnia, Joint pain, Joint swelling, Left tibial fracture, Multiple duodenal ulcers, Nocturia, OSA on CPAP, Pneumonia (2001), Pure hypercholesterolemia (05/22/2019), Type II diabetes mellitus ([place]), Unsteady gait, Urinary frequency, and Wears glasses.   reports that he quit smoking about 33 years ago. His smoking use included cigarettes. He has a 22.50 pack-year smoking history. He has never used smokeless tobacco.  Past Surgical History:  Procedure Laterality Date   ANTERIOR CERVICAL DECOMP/DISCECTOMY FUSION  10/2010   ANTERIOR LAT LUMBAR FUSION  12/02/2011   Procedure: ANTERIOR LATERAL LUMBAR FUSION 1 LEVEL;  Surgeon: Eustace Moore, MD;  Location: [facility];  Service: Neurosurgery;  Laterality: Left;  left lumbar three-four   BACK SURGERY     CATARACT EXTRACTION, BILATERAL Bilateral    about a month apart.    COLONOSCOPY  04/2007   ESOPHAGOGASTRODUODENOSCOPY     ESOPHAGOGASTRODUODENOSCOPY  04/2007; 07/2008   ESOPHAGOGASTRODUODENOSCOPY (EGD) WITH ESOPHAGEAL DILATION  "2-3 times"   EXCISIONAL HEMORRHOIDECTOMY  ~ [place] Left 12/08/2016   Procedure: LEFT KNEE FEMORAL REVISION;  Surgeon: Gaynelle Arabian, MD;  Location: [facility];  Service: Orthopedics;  Laterality: Left;  Adductor Block   FINGER SURGERY Right    pinky finger   JOINT REPLACEMENT     KNEE ARTHROTOMY Left 07/24/2018   Procedure: Left knee arthrotomy with scar excision;  Surgeon: Gaynelle Arabian, MD;  Location: [facility];  Service: Orthopedics;  Laterality: Left;   KNEE ARTHROTOMY Left 05/02/2019   Procedure: Left knee arthrotomy; scar excision;  Surgeon: Gaynelle Arabian, MD;  Location: [facility];  Service:  Orthopedics;  Laterality: Left;  78mn   KNEE CLOSED REDUCTION Left 06/21/2016   Procedure: CLOSED MANIPULATION LEFT KNEE;  Surgeon: AGaynelle Arabian MD;  Location: [facility];  Service: Orthopedics;  Laterality: Left;   KNEE CLOSED REDUCTION Left 02/28/2017   Procedure: CLOSED MANIPULATION KNEE;  Surgeon: AGaynelle Arabian MD;  Location: [facility];  Service: Orthopedics;  Laterality: Left;   KNEE JOINT MANIPULATION  06/2009   LAPAROSCOPIC CHOLECYSTECTOMY  02/2000   LUMBAR FUSION  03/16/2012   Dr JRonnald Ramp  ORIF TIBIA PLATEAU Left 09/24/2015   Procedure: OPEN REDUCTION INTERNAL FIXATION (ORIF) LEFT TIBIAL PLATEAU;  Surgeon: FGaynelle Arabian MD;  Location: [facility];  Service: Orthopedics;  Laterality: Left;   PARTIAL KNEE ARTHROPLASTY Left 07/30/2015   Procedure: UNICOMPARTMENTAL LEFT KNEE MEDIAL;  Surgeon: FGaynelle Arabian MD;  Location: [facility];  Service: Orthopedics;  Laterality: Left;   POSTERIOR LUMBAR FUSION  04/2015   rods inserted L2   REFRACTIVE SURGERY Right 1980s   SHOULDER OPEN ROTATOR CUFF  REPAIR Bilateral 1990s   TENNIS ELBOW RELEASE/NIRSCHEL PROCEDURE Right 1980s   TOTAL KNEE ARTHROPLASTY Right 03/2009   TOTAL KNEE ARTHROPLASTY WITH REVISION COMPONENTS Left 03/03/2016   Procedure: REVISION LEFT KNEE UNICOMPARTMENTAL ARTHROPLASTY TO TOTAL KNEE ARTHROPLASTY;  Surgeon: Gaynelle Arabian, MD;  Location: [facility];  Service: Orthopedics;  Laterality: Left;  requests 2hrs; Adductor Block    No Known Allergies  Immunization History  Administered Date(s) Administered   Influenza, Quadrivalent, Recombinant, Inj, Pf 11/18/2016, 10/05/2018, 12/04/2020   PFIZER(Purple Top)SARS-COV-2 Vaccination 02/23/2019, 03/16/2019, 12/05/2019   PPD Test 09/02/2015   Pneumococcal Conjugate-13 11/02/2014   Pneumococcal Polysaccharide-23 03/17/2007, 12/03/2011, 10/29/2012, 11/25/2015   Zoster, Live 01/23/2014    Family History  Problem Relation Age of Onset   Diabetes Brother    Lung cancer Brother    Stomach cancer Mother     Lung cancer Father    Lung cancer Brother    Hodgkin's lymphoma Brother    Lung cancer Brother      Current Outpatient Medications:    aspirin EC 81 MG tablet, Take 81 mg by mouth daily. Swallow whole., Disp: , Rfl:    atorvastatin (LIPITOR) 80 MG tablet, Take 40-80 mg by mouth See admin instructions. Takes 80 mg by mouth daily on Monday, Wednesday, Fridays and take 40 mg by mouth on Tue, Thurs, Sat, and Sun, Disp: , Rfl:    docusate sodium (COLACE) 100 MG capsule, Take 200 mg by mouth at bedtime., Disp: , Rfl:    fenofibrate (TRICOR) 145 MG tablet, Take 1 tablet by mouth once daily, Disp: 30 tablet, Rfl: 0   finasteride (PROSCAR) 5 MG tablet, Take 5 mg by mouth every evening. , Disp: , Rfl:    glimepiride (AMARYL) 2 MG tablet, Take 2 mg by mouth daily with breakfast., Disp: , Rfl:    levothyroxine (SYNTHROID) 50 MCG tablet, Take 50-100 mcg by mouth See admin instructions. Take 100 mcg by mouth daily on Monday, Wednesday and Friday. Take 50 mcg by mouth daily on Tue, Thurs, Sat, and Sun, Disp: , Rfl:    methocarbamol (ROBAXIN) 500 MG tablet, Take 1 tablet by mouth daily., Disp: , Rfl:    Methylcellulose, Laxative, (CITRUCEL PO), Take 2 tablets by mouth daily., Disp: , Rfl:    metoprolol succinate (TOPROL-XL) 25 MG 24 hr tablet, Take 50 mg by mouth daily., Disp: , Rfl:    Omega-3 Fatty Acids (FISH OIL) 1000 MG CAPS, Take 1 capsule (1,000 mg total) by mouth daily., Disp: 90 capsule, Rfl: 1   omeprazole (PRILOSEC) 20 MG capsule, Take 20 mg by mouth daily., Disp: , Rfl:    oxyCODONE (OXY IR/ROXICODONE) 5 MG immediate release tablet, Take 1 tablet (5 mg total) by mouth every 4 (four) hours as needed for moderate pain or severe pain. (Patient taking differently: Take 5 mg by mouth daily.), Disp: 60 tablet, Rfl: 0   pioglitazone-metformin (ACTOPLUS MET) 15-500 MG per tablet, Take 1 tablet by mouth daily. , Disp: , Rfl:    Saw Palmetto 450 MG CAPS, Take 1 capsule by mouth daily., Disp: , Rfl:     Turmeric (QC TUMERIC COMPLEX PO), Take 2 tablets by mouth daily., Disp: , Rfl:   Current Facility-Administered Medications:    incobotulinumtoxinA (XEOMIN) 100 units injection 400 Units, 400 Units, Intramuscular, Q90 days, Marcial Pacas, MD, 400 Units at 12/15/17 1446      Objective:   Vitals:   01/06/21 0928  BP: 122/74  Pulse: 65  Temp: 97.9 F (36.6 C)  TempSrc:  Oral  SpO2: 100%  Weight: 216 lb 12.8 oz (98.3 kg)  Height: _0  (1.702 m)    Estimated body mass index is 33.96 kg/m as calculated from the following:   Height as of this encounter: _1  (1.702 m).   Weight as of this encounter: 216 lb 12.8 oz (98.3 kg).  _2 @  [facility] Weights   01/06/21 0928  Weight: 216 lb 12.8 oz (98.3 kg)     Physical ExamGeneral: No distress. Looks well Neuro: Alert and Oriented x 3. GCS 15. Speech normal Psych: Pleasant Resp:  Barrel Chest - no.  Wheeze - no, Crackles -possible crackles in the lung base present, No overt respiratory distress CVS: Normal heart sounds. Murmurs - no Ext: Stigmata of Connective Tissue Disease - no HEENT: Normal upper airway. PEERL +. No post nasal drip        Assessment:       ICD-10-CM   1. DOE (dyspnea on exertion)  R06.09     2. Chronic cough  R05.3     3. History of wheezing  Z87.898     4. Former heavy cigarette smoker (20-39 per day)  Z87.891          Plan:     Patient Instructions     ICD-10-CM   1. DOE (dyspnea on exertion)  R06.09     2. Chronic cough  R05.3     3. History of wheezing  Z87.898     4. Former heavy cigarette smoker (20-39 per day)  Z87.891       Reasons for shortness of breath can be various.  We need to initiate work-up to narrow the possibilities.  Some possibilities include just physical deconditioning and stiff heart muscle but need to rule out pulmonary fibrosis or allergic asthma or emphysema/COPD any problems related to amiodarone in the lung  Plan - Do high-resolution CT chest supine and  prone - Do full pulmonary function test - Do breathing test called feNO (exhaled NO)  - Do CBC with differential, blood IgE and RAST allergy panel today  Follow-up - Return to see nurse practitioner for a video visit or face-to-face visit to discuss test results in the next few weeks    SIGNATURE    Dr. Brand Males, M.D., F.C.C.P,  Pulmonary and Critical Care Medicine Staff Physician, [place] Director - Interstitial Lung Disease  Program  Pulmonary [facility] [place], [place], [address]  Pager: [phone], If no answer or between  15:00h - 7:00h: call [phone] Telephone: [phone]  9:57 AM 01/06/2021

## 2021-01-06 NOTE — Addendum Note (Signed)
Addended by: Wyvonne Lenz on: 01/06/2021 10:00 AM   Modules accepted: Orders

## 2021-01-07 LAB — RESPIRATORY ALLERGY PROFILE REGION II ~~LOC~~
Allergen, A. alternata, m6: <0.1 kU/L
Allergen, Cedar tree, t12: <0.1 kU/L
Allergen, Comm Silver Birch, t9: <0.1 kU/L
Allergen, Cottonwood, t14: <0.1 kU/L
Allergen, D pternoyssinus,d7: <0.1 kU/L
Allergen, Mouse Urine Protein, e78: <0.1 kU/L
Allergen, Mulberry, t76: <0.1 kU/L
Allergen, Oak,t7: <0.1 kU/L
Allergen, P. notatum, m1: <0.1 kU/L
Aspergillus fumigatus, m3: <0.1 kU/L
Bermuda Grass: <0.1 kU/L
Box Elder IgE: <0.1 kU/L
CLADOSPORIUM HERBARUM (M2) IGE: <0.1 kU/L
COMMON RAGWEED (SHORT) (W1) IGE: <0.1 kU/L
Cat Dander: <0.1 kU/L
Class: 0
Class: 0
Class: 0
Class: 0
Class: 0
Class: 0
Class: 0
Class: 0
Class: 0
Class: 0
Class: 0
Class: 0
Class: 0
Class: 0
Class: 0
Class: 0
Class: 0
Class: 0
Class: 0
Class: 0
Class: 0
Class: 0
Class: 0
Class: 1
Cockroach: <0.1 kU/L
D. farinae: <0.1 kU/L
Dog Dander: <0.1 kU/L
Elm IgE: <0.1 kU/L
IgE (Immunoglobulin E), Serum: 12 kU/L (ref <=114)
Johnson Grass: <0.1 kU/L
Pecan/Hickory Tree IgE: 0.55 kU/L — ABNORMAL HIGH
Rough Pigweed  IgE: <0.1 kU/L
Sheep Sorrel IgE: <0.1 kU/L
Timothy Grass: <0.1 kU/L

## 2021-01-07 LAB — INTERPRETATION:

## 2021-01-20 ENCOUNTER — Other Ambulatory Visit: Payer: Self-pay | Admitting: Cardiovascular Disease

## 2021-01-20 NOTE — Telephone Encounter (Signed)
Rx(s) sent to pharmacy electronically.  

## 2021-01-21 ENCOUNTER — Other Ambulatory Visit: Payer: Self-pay

## 2021-01-21 ENCOUNTER — Ambulatory Visit (INDEPENDENT_AMBULATORY_CARE_PROVIDER_SITE_OTHER)
Admission: RE | Admit: 2021-01-21 | Discharge: 2021-01-21 | Disposition: A | Discharge status: Home / Self Care | Payer: Medicare Other | Source: Ambulatory Visit | Attending: Internal Medicine | Admitting: Internal Medicine

## 2021-01-21 DIAGNOSIS — R0609 Other forms of dyspnea: Secondary | ICD-10-CM

## 2021-02-06 ENCOUNTER — Ambulatory Visit (INDEPENDENT_AMBULATORY_CARE_PROVIDER_SITE_OTHER): Payer: Medicare Other | Admitting: Nurse Practitioner

## 2021-02-06 ENCOUNTER — Other Ambulatory Visit: Payer: Self-pay

## 2021-02-06 ENCOUNTER — Encounter: Payer: Self-pay | Admitting: Nurse Practitioner

## 2021-02-06 ENCOUNTER — Ambulatory Visit (INDEPENDENT_AMBULATORY_CARE_PROVIDER_SITE_OTHER): Payer: Medicare Other | Admitting: Internal Medicine

## 2021-02-06 VITALS — BP 118/62 | HR 63 | Temp 97.9°F | Ht 67.0 in | Wt 214.6 lb

## 2021-02-06 DIAGNOSIS — Z87898 Personal history of other specified conditions: Secondary | ICD-10-CM

## 2021-02-06 DIAGNOSIS — Z9989 Dependence on other enabling machines and devices: Secondary | ICD-10-CM

## 2021-02-06 DIAGNOSIS — R0609 Other forms of dyspnea: Secondary | ICD-10-CM | POA: Diagnosis not present

## 2021-02-06 DIAGNOSIS — J302 Other seasonal allergic rhinitis: Secondary | ICD-10-CM

## 2021-02-06 DIAGNOSIS — J4489 Other specified chronic obstructive pulmonary disease: Secondary | ICD-10-CM

## 2021-02-06 DIAGNOSIS — J449 Chronic obstructive pulmonary disease, unspecified: Secondary | ICD-10-CM | POA: Diagnosis not present

## 2021-02-06 DIAGNOSIS — K219 Gastro-esophageal reflux disease without esophagitis: Secondary | ICD-10-CM | POA: Diagnosis not present

## 2021-02-06 DIAGNOSIS — G4733 Obstructive sleep apnea (adult) (pediatric): Secondary | ICD-10-CM | POA: Diagnosis not present

## 2021-02-06 DIAGNOSIS — R053 Chronic cough: Secondary | ICD-10-CM

## 2021-02-06 DIAGNOSIS — R911 Solitary pulmonary nodule: Secondary | ICD-10-CM

## 2021-02-06 LAB — PULMONARY FUNCTION TEST
DL/VA % pred: 110 %
DL/VA: 4.35 ml/min/mmHg/L
DLCO cor % pred: 99 %
DLCO cor: 22.05 ml/min/mmHg
DLCO unc % pred: 95 %
DLCO unc: 20.99 ml/min/mmHg
FEF 25-75 Post: 1.42 L/sec
FEF 25-75 Pre: 0.9 L/sec
FEF2575-%Change-Post: 58 %
FEF2575-%Pred-Post: 85 %
FEF2575-%Pred-Pre: 54 %
FEV1-%Change-Post: 14 %
FEV1-%Pred-Post: 70 %
FEV1-%Pred-Pre: 61 %
FEV1-Post: 1.72 L
FEV1-Pre: 1.51 L
FEV1FVC-%Change-Post: -2 %
FEV1FVC-%Pred-Pre: 96 %
FEV6-%Change-Post: 15 %
FEV6-%Pred-Post: 77 %
FEV6-%Pred-Pre: 67 %
FEV6-Post: 2.51 L
FEV6-Pre: 2.18 L
FEV6FVC-%Change-Post: -1 %
FEV6FVC-%Pred-Post: 106 %
FEV6FVC-%Pred-Pre: 107 %
FVC-%Change-Post: 17 %
FVC-%Pred-Post: 73 %
FVC-%Pred-Pre: 62 %
FVC-Post: 2.56 L
FVC-Pre: 2.18 L
Post FEV1/FVC ratio: 67 %
Post FEV6/FVC ratio: 98 %
Pre FEV1/FVC ratio: 69 %
Pre FEV6/FVC Ratio: 100 %

## 2021-02-06 MED ORDER — LORATADINE 10 MG PO TABS: 10.0000 mg | ORAL_TABLET | Freq: Every day | ORAL | 3 refills | Status: DC

## 2021-02-06 MED ORDER — FLUTICASONE PROPIONATE 50 MCG/ACT NA SUSP: 1.0000 | Freq: Every day | NASAL | 2 refills | Status: DC

## 2021-02-06 MED ORDER — BUDESONIDE-FORMOTEROL FUMARATE 160-4.5 MCG/ACT IN AERO: 2.0000 | INHALATION_SPRAY | Freq: Two times a day (BID) | RESPIRATORY_TRACT | 3 refills | Status: DC

## 2021-02-06 MED ORDER — ALBUTEROL SULFATE HFA 108 (90 BASE) MCG/ACT IN AERS: 2.0000 | INHALATION_SPRAY | Freq: Four times a day (QID) | RESPIRATORY_TRACT | 2 refills | Status: AC | PRN

## 2021-02-06 NOTE — Assessment & Plan Note (Signed)
Persistent allergy type symptoms. Allergen panel relatively unremarkable aside from hickory/pecan. Will start therapy for symptom management with loratadine 10 mg daily for allergy symptoms and flonase nasal spray. See above.

## 2021-02-06 NOTE — Progress Notes (Signed)
PFT done today. 

## 2021-02-06 NOTE — Assessment & Plan Note (Signed)
Will repeat CT chest in 3 months given smoking history for follow up.

## 2021-02-06 NOTE — Patient Instructions (Addendum)
Continue omeprazole 20 mg daily   -Albuterol inhaler 2 puffs every 6 hours as needed for shortness of breath or wheezing. Take 2 puffs 15 minutes prior to exercise. This is your rescue inhaler. Notify if persistent shortness of breath or wheezing occurs despite using your inhaler.  -Symbicort 160 2 puffs Twice daily. Brush tongue and rinse mouth afterwards. This is your maintenance inhaler which you will use every day despite symptoms. -Claritin (loratadine) 10 mg daily for allergies  -Flonase nasal spray 1-2 sprays each nostril daily for allergy symptoms, nasal congestion, and post nasal drip  Asthma Action Plan in place Rinse mouth after inhaled corticosteroid use.  Avoid triggers, when able.  Exercise encouraged. Notify if worsening symptoms upon exertion.  Notify and seek help if symptoms unrelieved by rescue inhaler.   Continue CPAP therapy as directed by neurology. Follow up as scheduled.  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 You also have some restriction in your lung function, which could be a result of weight. Exercise encouraged and healthy weight management.   Follow up in one month with Dr. Marchelle Gearing or Philis Nettle. If symptoms do not improve or worsen, please contact office for sooner follow up or seek emergency care.

## 2021-02-06 NOTE — Assessment & Plan Note (Signed)
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 Suspect very mild restriction from obesity; no ILD on HRCT. Eos 300. IgE normal. Initiate therapy with Symbicort 160 2 puffs Twice daily given persistent, frequent symptoms. Albuterol PRN. Educated on proper inhaler use and maintenance vs rescue. Verbalized understanding. Close follow up.   Patient Instructions  Continue omeprazole 20 mg daily   -Albuterol inhaler 2 puffs every 6 hours as needed for shortness of breath or wheezing. Take 2 puffs 15 minutes prior to exercise. This is your rescue inhaler. Notify if persistent shortness of breath or wheezing occurs despite using your inhaler.  -Symbicort 160 2 puffs Twice daily. Brush tongue and rinse mouth afterwards. This is your maintenance inhaler which you will use every day despite symptoms. -Claritin (loratadine) 10 mg daily for allergies  -Flonase nasal spray 1-2 sprays each nostril daily for allergy symptoms, nasal congestion, and post nasal drip  Asthma Action Plan in place Rinse mouth after inhaled corticosteroid use.  Avoid triggers, when able.  Exercise encouraged. Notify if worsening symptoms upon exertion.  Notify and seek help if symptoms unrelieved by rescue inhaler.   Continue CPAP therapy as directed by neurology. Follow up as scheduled.  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 You also have some restriction in your lung function, which could be a result of weight. Exercise encouraged and healthy weight management.   Follow up in one month with Dr. Marchelle Gearing or Philis Nettle. If symptoms do not improve or worsen, please contact office for sooner follow up or seek emergency care.

## 2021-02-06 NOTE — Progress Notes (Signed)
_0  ID: [name], male    DOB: 1940-12-09, 80 y.o.   MRN: [id number]  Chief Complaint  Patient presents with   Follow-up    Follow up with full pfts before.     Referring provider: [name], MD  HPI: 80 year old male, former smoker (22.5 pack years) followed for dyspnea upon exertion.  He is a patient Dr. Golden Pop and was last seen in office on 01/06/2021.  Past medical history significant for PAF, allergic rhinitis, OSA on CPAP (followed by neurology), GERD, DM2, obesity, hypertriglyceridemia, status post knee replacement.   TEST/EVENTS:  2017 CPAP titration: AHI 12.1/hr 03/2019 echocardiogram: G1 DD, otherwise normal 01/21/2021 HRCT chest: Atherosclerosis.  No lymphadenopathy.  Scattered areas of nodular groundglass in the right lung measure up to 10 mm in the lateral segment right middle lobe.  Millimetric upper lobe pulmonary nodules, similar and likely postinfectious in etiology.  Repeat CT in 3 to 6 months recommended.  No evidence of ILD. 02/06/2021 PFTs: FVC 2.56 (73), FEV1 1.72 (70), ratio 67, TLC 5.04 (78), DLCO corrected 22.05 (99), +BD.   01/06/2021: OV with Dr. Chase Caller.  Consult for dyspnea upon exertion.  Also experiencing dry cough and wheezing.  Previous normal cardiac stress test.  Compliant with CPAP therapy.  02/06/2021: Today -PFT follow up Patient presents today for follow up after pulmonary function testing which showed COPD/asthma overlap with a very mild restriction in lung volume/function and a normal diffusion capacity with positive bronchodilator response. He continues to experience dyspnea with exertion and occasion wheeze. He reports his cough is more of a tickle in his throat. He does also have allergy symptoms including sneezing and itchy/watery eyes with occasional nasal congestion that is clear. He denies orthopnea, PND, chest pain, or lower extremity swelling. He is not currently on any pulmonary medications. He continues on omeprazole  daily with control of his reflux. He is compliant with his CPAP nightly.   No Known Allergies  Immunization History  Administered Date(s) Administered   Influenza, Quadrivalent, Recombinant, Inj, Pf 11/18/2016, 10/05/2018, 12/04/2020   Influenza-Unspecified 11/24/2016   PFIZER(Purple Top)SARS-COV-2 Vaccination 02/23/2019, 03/16/2019, 12/05/2019   PPD Test 09/02/2015   Pneumococcal Conjugate-13 11/02/2014   Pneumococcal Polysaccharide-23 03/17/2007, 12/03/2011, 10/29/2012, 11/25/2015   Zoster, Live 01/23/2014    Past Medical History:  Diagnosis Date   Arthritis    "hands; probably in my back too" (08/28/2015); osteo related   Barrett's esophagus with dysplasia    Chronic lower back pain    DDD   Closed patellar sleeve fracture of left knee    Constipation    Crush injury knee    bilateral   Dysphagia    EGD with dilitation   Dyspnea    walking long distance   Dysrhythmia    thought pt. had a fib after surgery saw cardiology later and ok meds preventive per pt.   Enlarged prostate    sees a urologist   Exertional dyspnea 03/21/2019   GERD (gastroesophageal reflux disease)    takes Omeprazole daily   Hard of hearing    Hyperlipidemia    takes Lipitor and Fish Oil daily   Insomnia    d/t pain   Joint pain    Joint swelling    Left tibial fracture    Multiple duodenal ulcers    Nocturia    OSA on CPAP    cpap   Pneumonia 2001   Pure hypercholesterolemia 05/22/2019   Type II diabetes mellitus ([place])    type 2  Unsteady gait    Urinary frequency    Wears glasses     Tobacco History: Social History   Tobacco Use  Smoking Status Former   Packs/day: 1.50   Years: 15.00   Pack years: 22.50   Types: Cigarettes   Quit date: 1989   Years since quitting: 34.0  Smokeless Tobacco Never   Counseling given: Not Answered   Outpatient Medications Prior to Visit  Medication Sig Dispense Refill   aspirin EC 81 MG tablet Take 81 mg by mouth daily. Swallow whole.      atorvastatin (LIPITOR) 80 MG tablet Take 40-80 mg by mouth See admin instructions. Takes 80 mg by mouth daily on Monday, Wednesday, Fridays and take 40 mg by mouth on Tue, Thurs, Sat, and Sun     docusate sodium (COLACE) 100 MG capsule Take 200 mg by mouth at bedtime.     fenofibrate (TRICOR) 145 MG tablet Take 1 tablet by mouth once daily 90 tablet 2   finasteride (PROSCAR) 5 MG tablet Take 5 mg by mouth every evening.      glimepiride (AMARYL) 2 MG tablet Take 2 mg by mouth daily with breakfast.     levothyroxine (SYNTHROID) 50 MCG tablet Take 50-100 mcg by mouth See admin instructions. Take 100 mcg by mouth daily on Monday, Wednesday and Friday. Take 50 mcg by mouth daily on Tue, Thurs, Sat, and Sun     methocarbamol (ROBAXIN) 500 MG tablet Take 1 tablet by mouth daily.     Methylcellulose, Laxative, (CITRUCEL PO) Take 2 tablets by mouth daily.     metoprolol succinate (TOPROL-XL) 25 MG 24 hr tablet Take 50 mg by mouth daily.     omeprazole (PRILOSEC) 20 MG capsule Take 20 mg by mouth daily.     oxyCODONE (OXY IR/ROXICODONE) 5 MG immediate release tablet Take 1 tablet (5 mg total) by mouth every 4 (four) hours as needed for moderate pain or severe pain. (Patient taking differently: Take 5 mg by mouth daily.) 60 tablet 0   pioglitazone-metformin (ACTOPLUS MET) 15-500 MG per tablet Take 1 tablet by mouth daily.      Saw Palmetto 450 MG CAPS Take 1 capsule by mouth daily.     Turmeric (QC TUMERIC COMPLEX PO) Take 2 tablets by mouth daily.     Omega-3 Fatty Acids (FISH OIL) 1000 MG CAPS Take 1 capsule (1,000 mg total) by mouth daily. (Patient not taking: Reported on 02/06/2021) 90 capsule 1   Facility-Administered Medications Prior to Visit  Medication Dose Route Frequency Provider Last Rate Last Admin   incobotulinumtoxinA (XEOMIN) 100 units injection 400 Units  400 Units Intramuscular Q90 days Marcial Pacas, MD   400 Units at 12/15/17 1446     Review of Systems:   Constitutional: No weight loss  or gain, night sweats, fevers, chills, fatigue, or lassitude. HEENT: No headaches, difficulty swallowing, tooth/dental problems, or sore throat. +sneezing, itchy/watery eyes, nasal congestion, rhinorrhea CV:  No chest pain, orthopnea, PND, swelling in lower extremities, anasarca, dizziness, palpitations, syncope Resp: +shortness of breath with exertion; dry cough; occasional wheeze. No excess mucus or change in color of mucus. No hemoptysis.   No chest wall deformity GI:  No heartburn, indigestion, abdominal pain, nausea, vomiting, diarrhea, change in bowel habits, loss of appetite, bloody stools.  GU: No dysuria, change in color of urine, urgency or frequency.  No flank pain, no hematuria  Skin: No rash, lesions, ulcerations MSK:  No joint pain or swelling.  No decreased range  of motion.  No back pain. Neuro: No dizziness or lightheadedness.  Psych: No depression or anxiety. Mood stable.     Physical Exam:  BP 118/62 (BP Location: Left Arm, Patient Position: Sitting, Cuff Size: Normal)    Pulse 63    Temp 97.9 F (36.6 C) (Oral)    Ht _0  (1.702 m)    Wt 214 lb 9.6 oz (97.3 kg)    SpO2 97%    BMI 33.61 kg/m   GEN: Pleasant, interactive, well-appearing; obese; in no acute distress. HEENT:  Normocephalic and atraumatic. EACs patent bilaterally. TM pearly gray with present light reflex bilaterally. PERRLA. Sclera white. Nasal turbinates pink, moist and patent bilaterally. Clear rhinorrhea present. Oropharynx erythematous and moist, without exudate or edema. No lesions, ulcerations NECK:  Supple w/ fair ROM. No JVD present. Normal carotid impulses w/o bruits. Thyroid symmetrical with no goiter or nodules palpated. No lymphadenopathy.   CV: RRR, no m/r/g, no peripheral edema. Pulses intact, +2 bilaterally. No cyanosis, pallor or clubbing. PULMONARY:  Unlabored, regular breathing. Clear bilaterally A&P w/o wheezes/rales/rhonchi. No accessory muscle use. No dullness to percussion. GI: BS present  and normoactive. Soft, non-tender to palpation. No organomegaly or masses detected. No CVA tenderness. MSK: No erythema, warmth or tenderness. Cap refil <2 sec all extrem. No deformities or joint swelling noted.  Neuro: A/Ox3. No focal deficits noted.   Skin: Warm, no lesions or rashe Psych: Normal affect and behavior. Judgement and thought content appropriate.     Lab Results:  CBC    Component Value Date/Time   WBC 5.5 01/06/2021 1004   RBC 3.69 (L) 01/06/2021 1004   HGB 13.0 01/06/2021 1004   HCT 37.5 (L) 01/06/2021 1004   PLT 244.0 01/06/2021 1004   MCV 101.4 (H) 01/06/2021 1004   MCH 33.8 05/03/2019 0310   MCHC 34.8 01/06/2021 1004   RDW 13.5 01/06/2021 1004   LYMPHSABS 1.2 01/06/2021 1004   MONOABS 0.6 01/06/2021 1004   EOSABS 0.3 01/06/2021 1004   BASOSABS 0.1 01/06/2021 1004    BMET    Component Value Date/Time   NA 137 11/22/2019 0828   K 4.4 11/22/2019 0828   CL 102 11/22/2019 0828   CO2 22 11/22/2019 0828   GLUCOSE 176 (H) 11/22/2019 0828   GLUCOSE 260 (H) 05/03/2019 0310   BUN 16 11/22/2019 0828   CREATININE 0.92 11/22/2019 0828   CALCIUM 9.1 11/22/2019 0828   GFRNONAA 79 11/22/2019 0828   GFRAA 91 11/22/2019 0828    BNP No results found for: BNP   Imaging:  CT Chest High Resolution  Result Date: 01/23/2021 CLINICAL DATA:  Shortness of breath on exertion. EXAM: CT CHEST WITHOUT CONTRAST TECHNIQUE: Multidetector CT imaging of the chest was performed following the standard protocol without intravenous contrast. High resolution imaging of the lungs, as well as inspiratory and expiratory imaging, was performed. COMPARISON:  10/31/2010. FINDINGS: Cardiovascular: Atherosclerotic calcification of the aorta, aortic valve and coronary arteries. Heart size normal. No pericardial effusion. Mediastinum/Nodes: No pathologically enlarged mediastinal or axillary lymph nodes. Hilar regions are difficult to definitively evaluate without IV contrast. Esophagus is  grossly unremarkable. Lungs/Pleura: Negative for subpleural reticulation, traction bronchiectasis/bronchiolectasis, ground-glass, architectural distortion or honeycombing. Scattered areas of nodular ground-glass in the right lung measure up to 10 mm in the lateral segment right middle lobe (5/87). Comparison with the prior exam is challenging due to expiratory phase imaging on that study. Millimetric upper lobe pulmonary nodules, similar and likely postinfectious in etiology. No pleural fluid. Airway  is unremarkable. No air trapping. Upper Abdomen: Visualized portions of the liver, adrenal glands, spleen, pancreas, stomach and bowel are unremarkable. Musculoskeletal: Degenerative changes in the spine. No worrisome lytic or sclerotic lesions. IMPRESSION: 1. No evidence of interstitial lung disease. 2. Vague areas of nodular ground-glass in the right lung. Non-contrast chest CT at 3-6 months is recommended. If nodules persist, subsequent management will be based upon the most suspicious nodule(s). This recommendation follows the consensus statement: Guidelines for Management of Incidental Pulmonary Nodules Detected on CT Images: From the Fleischner Society 2017; Radiology 2017; 284:228-243. 3. Aortic atherosclerosis (ICD10-I70.0). Coronary artery calcification. Electronically Signed   By: Lorin Picket M.D.   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 Suspect very mild restriction from obesity; no ILD on HRCT. Eos 300. IgE  normal. Initiate therapy with Symbicort 160 2 puffs Twice daily given persistent, frequent symptoms. Albuterol PRN. Educated on proper inhaler use and maintenance vs rescue. Verbalized understanding. Close follow up.   Patient Instructions  Continue omeprazole 20 mg daily   -Albuterol inhaler 2 puffs every 6 hours as needed for shortness of breath or wheezing. Take 2 puffs 15 minutes prior to exercise. This is your rescue inhaler. Notify if persistent shortness of breath or wheezing occurs despite using your inhaler.  -Symbicort 160 2 puffs Twice daily. Brush tongue and rinse mouth afterwards. This is your maintenance inhaler which you will use every day despite symptoms. -Claritin (loratadine) 10 mg daily for allergies  -Flonase nasal spray 1-2 sprays each nostril daily for allergy symptoms, nasal congestion, and post nasal drip  Asthma Action Plan in place Rinse mouth after inhaled corticosteroid use.  Avoid triggers, when able.  Exercise encouraged. Notify if worsening symptoms upon exertion.  Notify and seek help if symptoms unrelieved by rescue inhaler.   Continue CPAP therapy as directed by neurology. Follow up as scheduled.  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 You also have some restriction in your lung function, which could be a result of weight. Exercise encouraged and healthy weight management.   Follow up in one month with Dr. Chase Caller or Alanson Aly. If symptoms do not improve or worsen, please contact office for sooner follow up or seek emergency care.   Other seasonal allergic rhinitis Persistent allergy type symptoms. Allergen panel relatively unremarkable aside from hickory/pecan. Will start therapy for symptom management with loratadine 10 mg daily for allergy symptoms and flonase nasal spray. See above.   OSA on CPAP Compliant with therapy. Continue CPAP therapy per neurology. Discussed safe driving and to be aware of reduced  alertness.  GERD without esophagitis Continue omeprazole daily. Well-controlled.  Pulmonary nodule Will repeat CT chest in 3 months given smoking history for follow up.    Clayton Bibles, NP 02/06/2021  Pt aware and understands NP's role.

## 2021-02-06 NOTE — Assessment & Plan Note (Addendum)
Compliant with therapy. Continue CPAP therapy per neurology. Discussed safe driving and to be aware of reduced alertness.

## 2021-02-06 NOTE — Assessment & Plan Note (Signed)
Continue omeprazole daily. Well-controlled.

## 2021-02-12 ENCOUNTER — Other Ambulatory Visit (HOSPITAL_COMMUNITY): Payer: Self-pay

## 2021-03-09 ENCOUNTER — Ambulatory Visit: Payer: Medicare Other | Admitting: Internal Medicine

## 2021-03-17 ENCOUNTER — Encounter: Payer: Self-pay | Admitting: Internal Medicine

## 2021-03-17 ENCOUNTER — Telehealth: Payer: Self-pay | Admitting: Internal Medicine

## 2021-03-17 ENCOUNTER — Other Ambulatory Visit: Payer: Self-pay

## 2021-03-17 ENCOUNTER — Encounter: Payer: Medicare Other | Admitting: *Deleted

## 2021-03-17 ENCOUNTER — Ambulatory Visit (INDEPENDENT_AMBULATORY_CARE_PROVIDER_SITE_OTHER): Payer: Medicare Other | Admitting: Internal Medicine

## 2021-03-17 VITALS — BP 118/64 | HR 66 | Temp 97.9°F | Ht 67.0 in | Wt 209.2 lb

## 2021-03-17 DIAGNOSIS — R911 Solitary pulmonary nodule: Secondary | ICD-10-CM

## 2021-03-17 DIAGNOSIS — J4540 Moderate persistent asthma, uncomplicated: Secondary | ICD-10-CM

## 2021-03-17 DIAGNOSIS — Z87891 Personal history of nicotine dependence: Secondary | ICD-10-CM

## 2021-03-17 DIAGNOSIS — Z006 Encounter for examination for normal comparison and control in clinical research program: Secondary | ICD-10-CM

## 2021-03-17 NOTE — Telephone Encounter (Signed)
Title: NIGHTINGALE: CliNIcal Utility of ManaGement of Patients witH CT and LDCT Identified Pulmonary Nodules UsinG the Percepta NasAL Swab ClassifiEr -- with Familiarization   Protocol #: [id number] Sponsor: [employer]   Protocol Revision 1 dated 01Sep2022 and confirmed current on today's visit, IRB approved Revision 1 on 29Dec2022.   Objectives:  Primary: To evaluate if use of the Percepta Nasal Swab test in the diagnostic work up of newly identified pulmonary nodules reduces the number of invasive procedures in the group classified as low-risk by the test and that are benign as compared to a control group managed without a Percepta Nasal Swab test result.                   A newly identified nodule is defined as any nodule first identified on imaging                   <90 days prior to nasal sample collection that hasnt undergone a diagnostic                    procedure for the management of their index nodule prior to enrollment.                   CT imaging includes conventional CT, LDCT, HRCT                   Benign diagnosis is defined as a specific diagnosis of a benign condition,                    radiographic resolution or stability at ? 24 months, or no cytological,                    radiological, or pathological evidence of cancer.                   Procedures will be categorized as either invasive or non-invasive in the Data                    Management Plan (DMP). Secondary: To evaluate if use of the Percepta Nasal Swab test in the diagnostic work up of newly identified pulmonary nodules increases the proportion of subjects classified as high-risk by the test and have primary lung cancer that go directly to appropriate therapy as compared to a control group managed without a Percepta Nasal Swab test result.                    Proportion of subjects that go directly to appropriate therapy is defined as those                     subjects that undergo surgery, ablative  or other appropriate therapy as the next                     step after the Percepta Nasal Swab test result without intervening non-surgical                     procedures                             a. Non-surgical procedures include diagnostic PET, but not PET for  staging purposes.                             b. Appropriate therapies will be defined in the CRF.                    A newly identified nodule is defined as any nodule first identified on imaging                    <90 days prior to nasal sample collection.                    Lung cancer diagnosis is defined as established by cytology or pathology, or in                     circumstances where a presumptive diagnosis of cancer led to definitive                     ablative or other appropriate therapy without pathology. Key Inclusion Criteria:  Inclusion Criteria:  Able to tolerate nasal epithelial specimen collection  Signed written Informed Consent obtained  Subject clinical history available for review by sponsor and regulatory agencies  New nodule first identified on imaging < 90 days prior to nasal sample collection (index nodule)  CT report available for index nodule  5 - 43 years of age  Current or former smoker (>100 cigarettes in a lifetime)  Pulmonary nodule ?30 mm detected by CT  Key Exclusion Criteria: Exclusion Criteria  Subject has undergone a diagnostic procedure for the management of their index nodule after the index CT and prior to enrollment  Active cancer (other than non-melanoma skin cancer)  Prior primary lung cancer (prior non-lung cancer acceptable)  Prior participation in this study (i.e., subjects may not be enrolled more than once)  Current active treatment with an investigational device or drug (patients in trial follow up period are okay if intervention phase is complete)  Patient enrolled or planned to be enrolled in another clinical trial that may influence  management of the patients nodule  Concurrent or planned use of tools or tests for assigning lung nodule risk of malignancy (e.g., genomic or proteomic blood tests) other than clinically validated risk calculators    This visit for Subject [name] with DOB: 17-Feb-1940 on 03/17/2021 for the above protocol is Visit/Encounter # consent  and is for purpose of research . Subject/LAR expressed continued interest and consent in continuing as a study subject. Subject thanked for participation in research and contribution to science.    When the patient presented for the standard of care visit and the new nodule was diagnosed.  I discussed the above study with him.  He expressed interest.  He signed informed consent.  He proceeded to have the swab.   CRN Jaye Beagle and I jointly dicussed the subject with Dr Carney Corners the PI a priori   SUB-INVESTIGATOR OVERSIGHT ATTESTATION  I the sub-Investigator (sub-I) for the above mentioned study attest that I reviewed the mentioned  clinical research coordinator  notes on research subject  [name]  born 07/04/1940 . I  agree with the findings mentioned above   Dr.Chudney Scheffler Chase Caller, MD Pulmonary and [place] Staff Physician [facility] [place] Pager: [phone], If no answer  or between  15:00h - 7:00h: call [phone]  03/17/2021 4:15 PM      SIGNATURE    Dr. Brand Males, M.D., F.C.C.P,  Pulmonary and Critical Care Medicine Staff Physician, [place] Director - Interstitial Lung Disease  Program  Pulmonary [place] [facility] [place], [place], [address]  NPI Number:  NPI [id number] [facility] Number: [id number]  Pager: [phone], If no answer  -> Check AMION or Try [place] Telephone (clinical office): [phone] Telephone  (research): [phone]  4:15 PM 03/17/2021

## 2021-03-17 NOTE — Patient Instructions (Addendum)
ICD-10-CM   1. Moderate persistent asthma without complication  123456     2. Pulmonary nodule  R91.1     3. Former heavy cigarette smoker (20-39 per day)  Z87.891       Moderate persistent asthma without complication  - new diagnosis given test results for symptms and improvement of dyspnea and wheeze with symbicort  - some throast hoarness with symbicort that we will watch  Plan  =- cntinue symbicort daily with albuterol as needed - next visit can aim to lower symbicort dosing  Pulmonary nodule RML  -16mm nodular density dec 2022 - new Former heavy cigarette smoker (20-39 per day)  - modest chance this is early stage lung cancer  Plan  - CT chest without contrast end march 2023 -early April 2023 - enroll in [employer] nightingale study with [facility] research team 03/17/2021   Followup April 2023 to discuss CT results - can see Judson Roch the app

## 2021-03-17 NOTE — Progress Notes (Signed)
OV 01/06/2021  Subjective:  Patient ID: [name], male , DOB: 1940/10/07 , age 81 y.o. , MRN: [id number] , ADDRESS: [place] [address] PCP [name], MD Patient Care Team: [name], MD as PCP - General (Internal Medicine) [name], MD as PCP - Cardiology (Cardiology) Gaynelle Arabian, MD as Consulting Physician (Orthopedic Surgery)  This Provider for this visit: Treatment Team:  Attending Provider: Brand Males, MD    01/06/2021 -   Chief Complaint  Patient presents with   Consult    Pt is being referred due to SOB. Pt states his SOB is worse with exertion but can have some SOB even at rest.     HPI [name] 81 y.o. -81 year old male referred by Dr. [name].  Whose note from 12/22/2020 I reviewed problems for shortness of breath with exertion and cough and wheezing.  He is a remote greater than 20 pack smoker.  He tells me that has had insidious onset of shortness of breath for over a year.  Is unable to quantify further.  It slightly progressive since it started.  Moderate intensity no associated chest pain.  He is also had intermittent wheezing particularly in the daytime but never during sleep associated with this for the same amount of time.  For the last month or 2 is also had a chronic dry cough.  The cough does not wake him up in the night but is present during the daytime.  He is not on ACE inhibitor.  He is on amiodarone.  He had a normal cardiac stress test.  He has diabetes.  He also has sleep apnea for which he uses CPAP.  In 2012 he was in the ER for pneumonia and kept overnight.  Visualized that CT scan of the chest and shows bilateral lower lobe atelectasis/infiltrates.  No further follow-up imaging available.    Xxxxxxxxxxxxxxxxxxxxxxxxx Rest of the history as below.   June 2022 he saw ENT at [facility].  This was for otitis externa.  And this is past medical history is listed as diabetes and back  surgery.  Medication review shows amiodarone, Lipitor, diabetes medications, Flomax, Synthroid, gabapentin and PPI amongst others    He is obese He has OSA and Remote smoker Grade 1 diastolic dysfunction Normal stress test 2021    His last set of labs was a year ago he had hyperglycemia at 176 blood sugar.  Creatinine normal.  He has seen Dr. [name]  Visit February 2021 echocardiogram read by Dr. Golden Hurter that shows grade 1 diastolic dysfunction but otherwise normal.  Then in March 2020 when he had a normal cardiac stress test nuclear medicine.  In 2017 there was a CPAP titration study that shows AHI of 12.1.  Simple office walk 185 feet x  3 laps goal with forehead probe 01/06/2021    O2 used ra   Number laps completed Avg limp with left leg   Comments about pace 3   Resting Pulse Ox/HR 100% and 65/min   Final Pulse Ox/HR 96% and 97/min   Desaturated </= 88% no   Desaturated <= 3% points Yes, 4   Got Tachycardic >/= 90/min yes   Symptoms at end of test Moderate to severe dyspea   Miscellaneous comments bese    CT angio Sept 2012 - visualized -  Comparison:  Chest radiograph performed 10/30/2010   Findings:  There is no evidence of pulmonary embolus.  Mild patchy opacities are noted at the lung bases; this raises  suspicion for pneumonia, given additional vague peribronchial  opacities within the right lower lobe and peribronchial thickening.  No significant pleural effusion or pneumothorax is seen.  No masses  are identified; no abnormal focal contrast enhancement is seen.   Scattered small periaortic, right paratracheal, aortopulmonary and  subcarinal nodes are seen, without significant mediastinal  lymphadenopathy.  No pericardial effusion is identified.  The great  vessels are unremarkable in appearance.  No axillary  lymphadenopathy is seen.  The thyroid gland is unremarkable in  appearance.    PFT  No flowsheet data found.     02/06/2021:  Today -PFT follow up Patient presents today for follow up after pulmonary function testing which showed COPD/asthma overlap with a very mild restriction in lung volume/function and a normal diffusion capacity with positive bronchodilator response. He continues to experience dyspnea with exertion and occasion wheeze. He reports his cough is more of a tickle in his throat. He does also have allergy symptoms including sneezing and itchy/watery eyes with occasional nasal congestion that is clear. He denies orthopnea, PND, chest pain, or lower extremity swelling. He is not currently on any pulmonary medications. He continues on omeprazole daily with control of his reflux. He is compliant with his CPAP nightly.   No Known Allergie  CT Chest data   TEST/EVENTS:  2017 CPAP titration: AHI 12.1/hr 03/2019 echocardiogram: G1 DD, otherwise normal 01/21/2021 HRCT chest: Atherosclerosis.  No lymphadenopathy.  Scattered areas of nodular groundglass in the right lung measure up to 10 mm in the lateral segment right middle lobe.  Millimetric upper lobe pulmonary nodules, similar and likely postinfectious in etiology.  Repeat CT in 3 to 6 months recommended.  No evidence of ILD. 02/06/2021 PFTs: FVC 2.56 (73), FEV1 1.72 (70), ratio 67, TLC 5.04 (78), DLCO corrected 22.05 (99), +BD. No results found.  OV 03/17/2021  Subjective:  Patient ID: [name], male , DOB: 10/02/1940 , age 70 y.o. , MRN: [id number] , ADDRESS: [place] [address] PCP Janie Morning, DO Patient Care Team: Janie Morning, DO as PCP - General (Family Medicine) [name], MD as PCP - Cardiology (Cardiology) Gaynelle Arabian, MD as Consulting Physician (Orthopedic Surgery)  This Provider for this visit: Treatment Team:  Attending Provider: Brand Males, MD    03/17/2021 -   Chief Complaint  Patient presents with   Follow-up    Pt states he has been doing okay since last visit and denies any complaints.      HPI [name] 81 y.o. - 81 year old male, former smoker (22.5 pack years) followed for dyspnea upon exertion.  He is a patient Dr. Golden Pop and was last seen in office on 01/06/2021.  Past medical history significant for PAF, allergic rhinitis, OSA on CPAP (followed by neurology), GERD, DM2, obesity, hypertriglyceridemia, status post knee replacement.   Presents for follow-up  #Dyspnea and wheezing: Last visit he saw a Designer, jewellery.  She put him on Symbicort.  After this shortness of breath is significantly improved.  He went to [facility] was able to walk long distances without any problems.  He was very surprised pleasantly.  His wheezing also has resolved.  He is super happy about this.  He does say that the Symbicort dry powder does cause some sensation of the throat.  He finds his voice not to be the same.  He also singing is not the same.  I did  indicate to him there is a common side effect with inhalers.  Proposed switching to another inhaler or changing to a nebulizer.  But for the moment we have resolved to continue the same.  #New finding he has a right middle lobe 10 mm nodule.  It is groundglass.  He is a former smoker.  Did discuss with him the possibility that this could be early stage lung cancer versus benign lesions.  I put the probably defer this is low-moderate.  Did indicate to him that he needs CT scan of the chest in 3 months.  Also spoke to him about potential research protocol that does a nasal swab and sent for genomic analysis.  The nasal swab was to help adjust the pretest probably of malignancy.  He is very interested in this.  He is willing to read the consent and if he agrees with the consent then he will try to enroll today.  I have contacted the research coordinator Jaye Beagle for this.    CT chest 01/21/21  Lungs/Pleura: Negative for subpleural reticulation, traction bronchiectasis/bronchiolectasis, ground-glass, architectural distortion or  honeycombing. Scattered areas of nodular ground-glass in the right lung measure up to 10 mm in the lateral segment right middle lobe (5/87). Comparison with the prior exam is challenging due to expiratory phase imaging on that study. Millimetric upper lobe pulmonary nodules, similar and likely postinfectious in etiology. No pleural fluid. Airway is unremarkable. No air trapping.   Upper Abdomen: Visualized portions of the liver, adrenal glands, spleen, pancreas, stomach and bowel are unremarkable.   Musculoskeletal: Degenerative changes in the spine. No worrisome lytic or sclerotic lesions.   IMPRESSION: 1. No evidence of interstitial lung disease. 2. Vague areas of nodular ground-glass in the right lung. Non-contrast chest CT at 3-6 months is recommended. If nodules persist, subsequent management will be based upon the most suspicious nodule(s). This recommendation follows the consensus statement: Guidelines for Management of Incidental Pulmonary Nodules Detected on CT Images: From the Fleischner Society 2017; Radiology 2017; 284:228-243. 3. Aortic atherosclerosis (ICD10-I70.0). Coronary artery calcification.     Electronically Signed   By: Lorin Picket M.D.   On: 01/23/2021 09:00  PFT  PFT Results Latest Ref Rng & Units 02/06/2021  FVC-Pre L 2.18  FVC-Predicted Pre % 62  FVC-Post L 2.56  FVC-Predicted Post % 73  Pre FEV1/FVC % % 69  Post FEV1/FCV % % 67  FEV1-Pre L 1.51  FEV1-Predicted Pre % 61  FEV1-Post L 1.72  DLCO uncorrected ml/min/mmHg 20.99  DLCO UNC% % 95  DLCO corrected ml/min/mmHg 22.05  DLCO COR %Predicted % 99  DLVA Predicted % 110    Latest Reference Range & Units 01/06/21 10:04  Pecan/Hickory Tree IgE kU/L 0.55 (H)  IgE (Immunoglobulin E), Serum <OR=114 kU/L 12  Allergen, D pternoyssinus,d7 kU/L <0.10  Cat Dander kU/L <0.10  Dog Dander kU/L <0.10  [place] Grass kU/L <0.10  [place] Grass kU/L <0.10  Timothy Grass kU/L <0.10  Cockroach kU/L  <0.10  Aspergillus fumigatus, m3 kU/L <0.10  Allergen, Comm Silver Wendee Copp, t9 kU/L <0.10  Allergen, Cottonwood, t14 kU/L <0.10  Elm IgE kU/L <0.10  Allergen, Mulberry, t76 kU/L <0.10  Allergen, Oak,t7 kU/L <0.10  COMMON RAGWEED (SHORT) (W1) IGE kU/L <0.10  Sheep Sorrel IgE kU/L <0.10  Allergen, Mouse Urine Protein, e78 kU/L <0.10  D. farinae kU/L <0.10  Allergen, Cedar tree, t12 kU/L <0.10  Box Elder IgE kU/L <0.10  Rough Pigweed  IgE kU/L <0.10  (H): Data is abnormally  high   Latest Reference Range & Units 10/27/10 09:50 10/30/10 21:32 10/31/10 09:34 11/01/10 05:00 11/26/11 08:30 03/16/12 06:36 04/08/15 09:40 08/28/15 12:04 09/18/15 11:35 01/06/21 10:04  Eosinophils Absolute 0.0 - 0.7 K/uL 0.3 0.1 0.0 0.7 0.4 0.5 0.4 0.3 0.2 0.3   No results found for: NITRICOXIDE    has a past medical history of Arthritis, Barrett's esophagus with dysplasia, Chronic lower back pain, Closed patellar sleeve fracture of left knee, Constipation, Crush injury knee, Dysphagia, Dyspnea, Dysrhythmia, Enlarged prostate, Exertional dyspnea (03/21/2019), GERD (gastroesophageal reflux disease), Hard of hearing, Hyperlipidemia, Insomnia, Joint pain, Joint swelling, Left tibial fracture, Multiple duodenal ulcers, Nocturia, OSA on CPAP, Pneumonia (2001), Pure hypercholesterolemia (05/22/2019), Type II diabetes mellitus ([place]), Unsteady gait, Urinary frequency, and Wears glasses.   reports that he quit smoking about 34 years ago. His smoking use included cigarettes. He has a 22.50 pack-year smoking history. He has never used smokeless tobacco.  Past Surgical History:  Procedure Laterality Date   ANTERIOR CERVICAL DECOMP/DISCECTOMY FUSION  10/2010   ANTERIOR LAT LUMBAR FUSION  12/02/2011   Procedure: ANTERIOR LATERAL LUMBAR FUSION 1 LEVEL;  Surgeon: Eustace Moore, MD;  Location: [facility];  Service: Neurosurgery;  Laterality: Left;  left lumbar three-four   BACK SURGERY     CATARACT EXTRACTION, BILATERAL Bilateral     about a month apart.    COLONOSCOPY  04/2007   ESOPHAGOGASTRODUODENOSCOPY     ESOPHAGOGASTRODUODENOSCOPY  04/2007; 07/2008   ESOPHAGOGASTRODUODENOSCOPY (EGD) WITH ESOPHAGEAL DILATION  "2-3 times"   EXCISIONAL HEMORRHOIDECTOMY  ~ [place] Left 12/08/2016   Procedure: LEFT KNEE FEMORAL REVISION;  Surgeon: Gaynelle Arabian, MD;  Location: [facility];  Service: Orthopedics;  Laterality: Left;  Adductor Block   FINGER SURGERY Right    pinky finger   JOINT REPLACEMENT     KNEE ARTHROTOMY Left 07/24/2018   Procedure: Left knee arthrotomy with scar excision;  Surgeon: Gaynelle Arabian, MD;  Location: [facility];  Service: Orthopedics;  Laterality: Left;   KNEE ARTHROTOMY Left 05/02/2019   Procedure: Left knee arthrotomy; scar excision;  Surgeon: Gaynelle Arabian, MD;  Location: [facility];  Service: Orthopedics;  Laterality: Left;  77mn   KNEE CLOSED REDUCTION Left 06/21/2016   Procedure: CLOSED MANIPULATION LEFT KNEE;  Surgeon: AGaynelle Arabian MD;  Location: [facility];  Service: Orthopedics;  Laterality: Left;   KNEE CLOSED REDUCTION Left 02/28/2017   Procedure: CLOSED MANIPULATION KNEE;  Surgeon: AGaynelle Arabian MD;  Location: [facility];  Service: Orthopedics;  Laterality: Left;   KNEE JOINT MANIPULATION  06/2009   LAPAROSCOPIC CHOLECYSTECTOMY  02/2000   LUMBAR FUSION  03/16/2012   Dr JRonnald Ramp  ORIF TIBIA PLATEAU Left 09/24/2015   Procedure: OPEN REDUCTION INTERNAL FIXATION (ORIF) LEFT TIBIAL PLATEAU;  Surgeon: FGaynelle Arabian MD;  Location: [facility];  Service: Orthopedics;  Laterality: Left;   PARTIAL KNEE ARTHROPLASTY Left 07/30/2015   Procedure: UNICOMPARTMENTAL LEFT KNEE MEDIAL;  Surgeon: FGaynelle Arabian MD;  Location: [facility];  Service: Orthopedics;  Laterality: Left;   POSTERIOR LUMBAR FUSION  04/2015   rods inserted L2   REFRACTIVE SURGERY Right 1980s   SHOULDER OPEN ROTATOR CUFF REPAIR Bilateral 1990s   TENNIS ELBOW RELEASE/NIRSCHEL PROCEDURE Right 1980s   TOTAL KNEE ARTHROPLASTY Right 03/2009   TOTAL KNEE  ARTHROPLASTY WITH REVISION COMPONENTS Left 03/03/2016   Procedure: REVISION LEFT KNEE UNICOMPARTMENTAL ARTHROPLASTY TO TOTAL KNEE ARTHROPLASTY;  Surgeon: FGaynelle Arabian MD;  Location: [facility];  Service: Orthopedics;  Laterality: Left;  requests 2hrs; Adductor  Block    No Known Allergies  Immunization History  Administered Date(s) Administered   Influenza, Quadrivalent, Recombinant, Inj, Pf 11/18/2016, 10/05/2018, 12/04/2020   Influenza-Unspecified 11/24/2016   PFIZER(Purple Top)SARS-COV-2 Vaccination 02/23/2019, 03/16/2019, 12/05/2019   PPD Test 09/02/2015   Pneumococcal Conjugate-13 11/02/2014   Pneumococcal Polysaccharide-23 03/17/2007, 12/03/2011, 10/29/2012, 11/25/2015   Zoster, Live 01/23/2014    Family History  Problem Relation Age of Onset   Diabetes Brother    Lung cancer Brother    Stomach cancer Mother    Lung cancer Father    Lung cancer Brother    Hodgkin's lymphoma Brother    Lung cancer Brother      Current Outpatient Medications:    albuterol (VENTOLIN HFA) 108 (90 Base) MCG/ACT inhaler, Inhale 2 puffs into the lungs every 6 (six) hours as needed for wheezing or shortness of breath., Disp: 8 g, Rfl: 2   aspirin EC 81 MG tablet, Take 81 mg by mouth daily. Swallow whole., Disp: , Rfl:    atorvastatin (LIPITOR) 80 MG tablet, Take 40-80 mg by mouth See admin instructions. Takes 80 mg by mouth daily on Monday, Wednesday, Fridays and take 40 mg by mouth on Tue, Thurs, Sat, and Sun, Disp: , Rfl:    budesonide-formoterol (SYMBICORT) 160-4.5 MCG/ACT inhaler, Inhale 2 puffs into the lungs 2 (two) times daily., Disp: 1 each, Rfl: 3   docusate sodium (COLACE) 100 MG capsule, Take 200 mg by mouth at bedtime., Disp: , Rfl:    fenofibrate (TRICOR) 145 MG tablet, Take 1 tablet by mouth once daily, Disp: 90 tablet, Rfl: 2   finasteride (PROSCAR) 5 MG tablet, Take 5 mg by mouth every evening. , Disp: , Rfl:    fluticasone (FLONASE) 50 MCG/ACT nasal spray, Place 1 spray into both  nostrils daily., Disp: 18.2 mL, Rfl: 2   glimepiride (AMARYL) 2 MG tablet, Take 2 mg by mouth daily with breakfast., Disp: , Rfl:    levothyroxine (SYNTHROID) 50 MCG tablet, Take 50-100 mcg by mouth See admin instructions. Take 100 mcg by mouth daily on Monday, Wednesday and Friday. Take 50 mcg by mouth daily on Tue, Thurs, Sat, and Sun, Disp: , Rfl:    loratadine (CLARITIN) 10 MG tablet, Take 1 tablet (10 mg total) by mouth daily., Disp: 30 tablet, Rfl: 3   methocarbamol (ROBAXIN) 500 MG tablet, Take 1 tablet by mouth daily., Disp: , Rfl:    Methylcellulose, Laxative, (CITRUCEL PO), Take 2 tablets by mouth daily., Disp: , Rfl:    metoprolol succinate (TOPROL-XL) 25 MG 24 hr tablet, Take 50 mg by mouth daily., Disp: , Rfl:    Omega-3 Fatty Acids (FISH OIL) 1000 MG CAPS, Take 1 capsule (1,000 mg total) by mouth daily., Disp: 90 capsule, Rfl: 1   omeprazole (PRILOSEC) 20 MG capsule, Take 20 mg by mouth daily., Disp: , Rfl:    oxyCODONE (OXY IR/ROXICODONE) 5 MG immediate release tablet, Take 1 tablet (5 mg total) by mouth every 4 (four) hours as needed for moderate pain or severe pain. (Patient taking differently: Take 5 mg by mouth daily.), Disp: 60 tablet, Rfl: 0   pioglitazone-metformin (ACTOPLUS MET) 15-500 MG per tablet, Take 1 tablet by mouth daily. , Disp: , Rfl:    Saw Palmetto 450 MG CAPS, Take 1 capsule by mouth daily., Disp: , Rfl:    Turmeric (QC TUMERIC COMPLEX PO), Take 2 tablets by mouth daily., Disp: , Rfl:   Current Facility-Administered Medications:    incobotulinumtoxinA (XEOMIN) 100 units injection 400 Units,  400 Units, Intramuscular, Q90 days, Marcial Pacas, MD, 400 Units at 12/15/17 1446      Objective:   Vitals:   03/17/21 1416  BP: 118/64  Pulse: 66  Temp: 97.9 F (36.6 C)  TempSrc: Oral  SpO2: 97%  Weight: 209 lb 3.2 oz (94.9 kg)  Height: 5' 7"  (1.702 m)    Estimated body mass index is 32.77 kg/m as calculated from the following:   Height as of this encounter: 5'  7" (1.702 m).   Weight as of this encounter: 209 lb 3.2 oz (94.9 kg).  @WEIGHTCHANGE @  [employer]   03/17/21 1416  Weight: 209 lb 3.2 oz (94.9 kg)     Physical Exam General: No distress. Looks well Neuro: Alert and Oriented x 3. GCS 15. Speech normal Psych: Pleasant Resp:  Barrel Chest - n.  Wheeze - no, Crackles - no, No overt respiratory distress CVS: Normal heart sounds. Murmurs - no Ext: Stigmata of Connective Tissue Disease - no HEENT: Normal upper airway. PEERL +. No post nasal drip        Assessment:       ICD-10-CM   1. Moderate persistent asthma without complication  F16.38     2. Pulmonary nodule  R91.1 CT Chest Wo Contrast    3. Former heavy cigarette smoker (20-39 per day)  Z87.891 CT Chest Wo Contrast         Plan:     Patient Instructions     ICD-10-CM   1. Moderate persistent asthma without complication  G66.59     2. Pulmonary nodule  R91.1     3. Former heavy cigarette smoker (20-39 per day)  Z87.891       Moderate persistent asthma without complication  - new diagnosis given test results for symptms and improvement of dyspnea and wheeze with symbicort  - some throast hoarness with symbicort that we will watch  Plan  =- cntinue symbicort daily with albuterol as needed - next visit can aim to lower symbicort dosing  Pulmonary nodule RML  -50m nodular density dec 2022 - new Former heavy cigarette smoker (20-39 per day)  - modest chance this is early stage lung cancer  Plan  - CT chest without contrast end march 2023 -early April 2023 - enroll in veracyte nightingale study with [facility] research team 03/17/2021   Followup April 2023 to discuss CT results - can see Sarah the app    SIGNATURE    Dr. MBrand Males M.D., F.C.C.P,  Pulmonary and Critical Care Medicine Staff Physician, CMaribelDirector - Interstitial Lung Disease  Program  [place] [id number] Pager: [phone] If no answer or between  15:00h - 7:00h: call [phone] Telephone: [phone]  2:48 PM 03/17/2021

## 2021-03-17 NOTE — Research (Signed)
Title: [place]: CliNIcal Utility of ManaGement of Patients witH CT and LDCT Identified Pulmonary Nodules UsinG the Percepta NasAL Swab ClassifiEr -- with Familiarization   Protocol #: [id number] Sponsor: [employer]   Protocol Revision 1 dated 01Sep2022 and confirmed current on today's visit, IRB approved Revision 1 on 29Dec2022.   Objectives:  Primary: To evaluate if use of the Percepta Nasal Swab test in the diagnostic work up of newly identified pulmonary nodules reduces the number of invasive procedures in the group classified as low-risk by the test and that are benign as compared to a control group managed without a Percepta Nasal Swab test result.                   A newly identified nodule is defined as any nodule first identified on imaging                   <90 days prior to nasal sample collection that hasnt undergone a diagnostic                    procedure for the management of their index nodule prior to enrollment.                   CT imaging includes conventional CT, LDCT, HRCT                   Benign diagnosis is defined as a specific diagnosis of a benign condition,                    radiographic resolution or stability at ? 24 months, or no cytological,                    radiological, or pathological evidence of cancer.                   Procedures will be categorized as either invasive or non-invasive in the Data                    Management Plan (DMP). Secondary: To evaluate if use of the Percepta Nasal Swab test in the diagnostic work up of newly identified pulmonary nodules increases the proportion of subjects classified as high-risk by the test and have primary lung cancer that go directly to appropriate therapy as compared to a control group managed without a Percepta Nasal Swab test result.                    Proportion of subjects that go directly to appropriate therapy is defined as those                     subjects that undergo surgery, ablative  or other appropriate therapy as the next                     step after the Percepta Nasal Swab test result without intervening non-surgical                     procedures                             a. Non-surgical procedures include diagnostic PET, but not PET for  staging purposes.                             b. Appropriate therapies will be defined in the CRF.                    A newly identified nodule is defined as any nodule first identified on imaging                    <90 days prior to nasal sample collection.                    Lung cancer diagnosis is defined as established by cytology or pathology, or in                     circumstances where a presumptive diagnosis of cancer led to definitive                     ablative or other appropriate therapy without pathology. Key Inclusion Criteria:  Inclusion Criteria:  Able to tolerate nasal epithelial specimen collection  Signed written Informed Consent obtained  Subject clinical history available for review by sponsor and regulatory agencies  New nodule first identified on imaging < 90 days prior to nasal sample collection (index nodule)  CT report available for index nodule  27 - 89 years of age  Current or former smoker (>100 cigarettes in a lifetime)  Pulmonary nodule ?30 mm detected by CT  Key Exclusion Criteria: Exclusion Criteria  Subject has undergone a diagnostic procedure for the management of their index nodule after the index CT and prior to enrollment  Active cancer (other than non-melanoma skin cancer)  Prior primary lung cancer (prior non-lung cancer acceptable)  Prior participation in this study (i.e., subjects may not be enrolled more than once)  Current active treatment with an investigational device or drug (patients in trial follow up period are okay if intervention phase is complete)  Patient enrolled or planned to be enrolled in another clinical trial that may influence  management of the patients nodule  Concurrent or planned use of tools or tests for assigning lung nodule risk of malignancy (e.g., genomic or proteomic blood tests) other than clinically validated risk calculators  Clinical Research Coordinator / Research RN note : This visit is for Enrollment /baseline Subject [id number] with DOB: [id number] on [id number] for the above protocol is an Enrollment Visit and is for purpose of research.    Subject expressed interest and consent in continuing as a study subject. Subject confirmed contact information (e.g. address, telephone, email). Subject thanked for participation in research and contribution to science.     During this visit on [id number]  , the subject reviewed and signed the consent form, provided demographics, and had a nasal swab collected per the above referenced protocol. Please refer to the subject's paper source binder for further details.   Dr. Brand Males was present for the consenting process.         Signed by Jaye Beagle RN, CRN II

## 2021-04-07 ENCOUNTER — Telehealth (INDEPENDENT_AMBULATORY_CARE_PROVIDER_SITE_OTHER): Payer: Medicare Other | Admitting: Adult Health

## 2021-04-07 DIAGNOSIS — Z9989 Dependence on other enabling machines and devices: Secondary | ICD-10-CM

## 2021-04-07 DIAGNOSIS — G4733 Obstructive sleep apnea (adult) (pediatric): Secondary | ICD-10-CM | POA: Diagnosis not present

## 2021-04-07 NOTE — Progress Notes (Signed)
PATIENT: [name] DOB: 29-Jun-1940  REASON FOR VISIT: follow up HISTORY FROM: patient PRIMARY NEUROLOGIST:   Virtual Visit via Video Note  I connected with [name] on 04/07/21 at  1:45 PM EST by a video enabled telemedicine application located remotely at [facility] and verified that I am speaking with the correct person using two identifiers who was located at their own home.   I discussed the limitations of evaluation and management by telemedicine and the availability of in person appointments. The patient expressed understanding and agreed to proceed.   PATIENT: [name] DOB: 12/09/1940  REASON FOR VISIT: follow up HISTORY FROM: patient  HISTORY OF PRESENT ILLNESS: Today 04/07/21:  [name] is an 81 year old male with a history of obstructive sleep apnea on CPAP.  He returns today for virtual visit.  He reports that the CPAP is working well for him.  Denies any new issues.  Download is below      REVIEW OF SYSTEMS: Out of a complete 14 system review of symptoms, the patient complains only of the following symptoms, and all other reviewed systems are negative.  ALLERGIES: No Known Allergies  HOME MEDICATIONS: Outpatient Medications Prior to Visit  Medication Sig Dispense Refill   albuterol (VENTOLIN HFA) 108 (90 Base) MCG/ACT inhaler Inhale 2 puffs into the lungs every 6 (six) hours as needed for wheezing or shortness of breath. 8 g 2   aspirin EC 81 MG tablet Take 81 mg by mouth daily. Swallow whole.     atorvastatin (LIPITOR) 80 MG tablet Take 40-80 mg by mouth See admin instructions. Takes 80 mg by mouth daily on Monday, Wednesday, Fridays and take 40 mg by mouth on Tue, Thurs, Sat, and Sun     budesonide-formoterol (SYMBICORT) 160-4.5 MCG/ACT inhaler Inhale 2 puffs into the lungs 2 (two) times daily. 1 each 3   docusate sodium (COLACE) 100 MG capsule Take 200 mg by mouth at bedtime.     fenofibrate (TRICOR) 145 MG  tablet Take 1 tablet by mouth once daily 90 tablet 2   finasteride (PROSCAR) 5 MG tablet Take 5 mg by mouth every evening.      fluticasone (FLONASE) 50 MCG/ACT nasal spray Place 1 spray into both nostrils daily. 18.2 mL 2   glimepiride (AMARYL) 2 MG tablet Take 2 mg by mouth daily with breakfast.     levothyroxine (SYNTHROID) 50 MCG tablet Take 50-100 mcg by mouth See admin instructions. Take 100 mcg by mouth daily on Monday, Wednesday and Friday. Take 50 mcg by mouth daily on Tue, Thurs, Sat, and Sun     loratadine (CLARITIN) 10 MG tablet Take 1 tablet (10 mg total) by mouth daily. 30 tablet 3   methocarbamol (ROBAXIN) 500 MG tablet Take 1 tablet by mouth daily.     Methylcellulose, Laxative, (CITRUCEL PO) Take 2 tablets by mouth daily.     metoprolol succinate (TOPROL-XL) 25 MG 24 hr tablet Take 50 mg by mouth daily.     Omega-3 Fatty Acids (FISH OIL) 1000 MG CAPS Take 1 capsule (1,000 mg total) by mouth daily. 90 capsule 1   omeprazole (PRILOSEC) 20 MG capsule Take 20 mg by mouth daily.     oxyCODONE (OXY IR/ROXICODONE) 5 MG immediate release tablet Take 1 tablet (5 mg total) by mouth every 4 (four) hours as needed for moderate pain or severe pain. (Patient taking differently: Take 5 mg by mouth daily.) 60 tablet 0   pioglitazone-metformin (ACTOPLUS  MET) 15-500 MG per tablet Take 1 tablet by mouth daily.      Saw Palmetto 450 MG CAPS Take 1 capsule by mouth daily.     Turmeric (QC TUMERIC COMPLEX PO) Take 2 tablets by mouth daily.     Facility-Administered Medications Prior to Visit  Medication Dose Route Frequency Provider Last Rate Last Admin   incobotulinumtoxinA (XEOMIN) 100 units injection 400 Units  400 Units Intramuscular Q90 days Marcial Pacas, MD   400 Units at 12/15/17 1446    PAST MEDICAL HISTORY: Past Medical History:  Diagnosis Date   Arthritis    "hands; probably in my back too" (08/28/2015); osteo related   Barrett's esophagus with dysplasia    Chronic lower back pain     DDD   Closed patellar sleeve fracture of left knee    Constipation    Crush injury knee    bilateral   Dysphagia    EGD with dilitation   Dyspnea    walking long distance   Dysrhythmia    thought pt. had a fib after surgery saw cardiology later and ok meds preventive per pt.   Enlarged prostate    sees a urologist   Exertional dyspnea 03/21/2019   GERD (gastroesophageal reflux disease)    takes Omeprazole daily   Hard of hearing    Hyperlipidemia    takes Lipitor and Fish Oil daily   Insomnia    d/t pain   Joint pain    Joint swelling    Left tibial fracture    Multiple duodenal ulcers    Nocturia    OSA on CPAP    cpap   Pneumonia 2001   Pure hypercholesterolemia 05/22/2019   Type II diabetes mellitus ([place])    type 2   Unsteady gait    Urinary frequency    Wears glasses     PAST SURGICAL HISTORY: Past Surgical History:  Procedure Laterality Date   ANTERIOR CERVICAL DECOMP/DISCECTOMY FUSION  10/2010   ANTERIOR LAT LUMBAR FUSION  12/02/2011   Procedure: ANTERIOR LATERAL LUMBAR FUSION 1 LEVEL;  Surgeon: Eustace Moore, MD;  Location: [place] [facility];  Service: Neurosurgery;  Laterality: Left;  left lumbar three-four   BACK SURGERY     CATARACT EXTRACTION, BILATERAL Bilateral    about a month apart.    COLONOSCOPY  04/2007   ESOPHAGOGASTRODUODENOSCOPY     ESOPHAGOGASTRODUODENOSCOPY  04/2007; 07/2008   ESOPHAGOGASTRODUODENOSCOPY (EGD) WITH ESOPHAGEAL DILATION  "2-3 times"   EXCISIONAL HEMORRHOIDECTOMY  ~ [place] Left 12/08/2016   Procedure: LEFT KNEE FEMORAL REVISION;  Surgeon: Gaynelle Arabian, MD;  Location: [facility];  Service: Orthopedics;  Laterality: Left;  Adductor Block   FINGER SURGERY Right    pinky finger   JOINT REPLACEMENT     KNEE ARTHROTOMY Left 07/24/2018   Procedure: Left knee arthrotomy with scar excision;  Surgeon: Gaynelle Arabian, MD;  Location: [facility];  Service: Orthopedics;  Laterality: Left;   KNEE ARTHROTOMY Left 05/02/2019   Procedure:  Left knee arthrotomy; scar excision;  Surgeon: Gaynelle Arabian, MD;  Location: [facility];  Service: Orthopedics;  Laterality: Left;  59min   KNEE CLOSED REDUCTION Left 06/21/2016   Procedure: CLOSED MANIPULATION LEFT KNEE;  Surgeon: Gaynelle Arabian, MD;  Location: [facility];  Service: Orthopedics;  Laterality: Left;   KNEE CLOSED REDUCTION Left 02/28/2017   Procedure: CLOSED MANIPULATION KNEE;  Surgeon: Gaynelle Arabian, MD;  Location: [facility];  Service: Orthopedics;  Laterality: Left;   KNEE JOINT  MANIPULATION  06/2009   LAPAROSCOPIC CHOLECYSTECTOMY  02/2000   LUMBAR FUSION  03/16/2012   Dr Ronnald Ramp   ORIF TIBIA PLATEAU Left 09/24/2015   Procedure: OPEN REDUCTION INTERNAL FIXATION (ORIF) LEFT TIBIAL PLATEAU;  Surgeon: Gaynelle Arabian, MD;  Location: [facility];  Service: Orthopedics;  Laterality: Left;   PARTIAL KNEE ARTHROPLASTY Left 07/30/2015   Procedure: UNICOMPARTMENTAL LEFT KNEE MEDIAL;  Surgeon: Gaynelle Arabian, MD;  Location: [facility];  Service: Orthopedics;  Laterality: Left;   POSTERIOR LUMBAR FUSION  04/2015   rods inserted L2   REFRACTIVE SURGERY Right 1980s   SHOULDER OPEN ROTATOR CUFF REPAIR Bilateral 1990s   TENNIS ELBOW RELEASE/NIRSCHEL PROCEDURE Right 1980s   TOTAL KNEE ARTHROPLASTY Right 03/2009   TOTAL KNEE ARTHROPLASTY WITH REVISION COMPONENTS Left 03/03/2016   Procedure: REVISION LEFT KNEE UNICOMPARTMENTAL ARTHROPLASTY TO TOTAL KNEE ARTHROPLASTY;  Surgeon: Gaynelle Arabian, MD;  Location: [facility];  Service: Orthopedics;  Laterality: Left;  requests 2hrs; Adductor Block    FAMILY HISTORY: Family History  Problem Relation Age of Onset   Diabetes Brother    Lung cancer Brother    Stomach cancer Mother    Lung cancer Father    Lung cancer Brother    Hodgkin's lymphoma Brother    Lung cancer Brother     SOCIAL HISTORY: Social History   Socioeconomic History   Marital status: Married    Spouse name: Not on file   Number of children: 6   Years of education: 16   Highest education level:  Bachelor's degree (e.g., BA, AB, BS)  Occupational History   Occupation: Part-time office work  Tobacco Use   Smoking status: Former    Packs/day: 1.50    Years: 15.00    Pack years: 22.50    Types: Cigarettes    Quit date: 1989    Years since quitting: 34.1   Smokeless tobacco: Never  Vaping Use   Vaping Use: Never used  Substance and Sexual Activity   Alcohol use: Not Currently    Comment: beer occasionally   Drug use: No   Sexual activity: Not Currently  Other Topics Concern   Not on file  Social History Narrative   Lives at home with his wife.   Right-handed.   3 cups caffeine per day.   Social Determinants of Health   Financial Resource Strain: Not on file  Food Insecurity: Not on file  Transportation Needs: Not on file  Physical Activity: Not on file  Stress: Not on file  Social Connections: Not on file  Intimate Partner Violence: Not on file      PHYSICAL EXAM Generalized: Well developed, in no acute distress   Neurological examination  Mentation: Alert oriented to time, place, history taking. Follows all commands speech and language fluent Cranial nerve II-XII:Extraocular movements were full. Facial symmetry noted.  Head turning and shoulder shrug  were normal and symmetric.     DIAGNOSTIC DATA (LABS, IMAGING, TESTING) - I reviewed patient records, labs, notes, testing and imaging myself where available.  Lab Results  Component Value Date   WBC 5.5 01/06/2021   HGB 13.0 01/06/2021   HCT 37.5 (L) 01/06/2021   MCV 101.4 (H) 01/06/2021   PLT 244.0 01/06/2021      Component Value Date/Time   NA 137 11/22/2019 0828   K 4.4 11/22/2019 0828   CL 102 11/22/2019 0828   CO2 22 11/22/2019 0828   GLUCOSE 176 (H) 11/22/2019 0828   GLUCOSE 260 (H) 05/03/2019 0310   BUN  16 11/22/2019 0828   CREATININE 0.92 11/22/2019 0828   CALCIUM 9.1 11/22/2019 0828   PROT 5.9 (L) 11/22/2019 0828   ALBUMIN 4.0 11/22/2019 0828   AST 15 11/22/2019 0828   ALT 19  11/22/2019 0828   ALKPHOS 64 11/22/2019 0828   BILITOT 1.0 11/22/2019 0828   GFRNONAA 79 11/22/2019 0828   GFRAA 91 11/22/2019 0828   Lab Results  Component Value Date   CHOL 167 11/22/2019   HDL 36 (L) 11/22/2019   LDLCALC 91 11/22/2019   TRIG 234 (H) 11/22/2019   CHOLHDL 4.6 11/22/2019   Lab Results  Component Value Date   HGBA1C 6.1 (H) 04/24/2019   No results found for: VITAMINB12 Lab Results  Component Value Date   TSH 4.710 (H) 10/06/2016      ASSESSMENT AND PLAN 81 y.o. year old male  has a past medical history of Arthritis, Barrett's esophagus with dysplasia, Chronic lower back pain, Closed patellar sleeve fracture of left knee, Constipation, Crush injury knee, Dysphagia, Dyspnea, Dysrhythmia, Enlarged prostate, Exertional dyspnea (03/21/2019), GERD (gastroesophageal reflux disease), Hard of hearing, Hyperlipidemia, Insomnia, Joint pain, Joint swelling, Left tibial fracture, Multiple duodenal ulcers, Nocturia, OSA on CPAP, Pneumonia (2001), Pure hypercholesterolemia (05/22/2019), Type II diabetes mellitus ([place]), Unsteady gait, Urinary frequency, and Wears glasses. here with:  OSA on CPAP  CPAP compliance excellent Residual AHI is good Encouraged patient to continue using CPAP nightly and > 4 hours each night F/U in 1 year or sooner if needed    Ward Givens, MSN, NP-C 04/07/2021, 1:54 PM [facility] [address] [place], [place] [address] [phone]

## 2021-04-11 ENCOUNTER — Other Ambulatory Visit (HOSPITAL_BASED_OUTPATIENT_CLINIC_OR_DEPARTMENT_OTHER): Payer: Self-pay | Admitting: Physician Assistant

## 2021-04-11 ENCOUNTER — Other Ambulatory Visit: Payer: Self-pay

## 2021-04-11 ENCOUNTER — Ambulatory Visit (HOSPITAL_BASED_OUTPATIENT_CLINIC_OR_DEPARTMENT_OTHER)
Admission: RE | Admit: 2021-04-11 | Discharge: 2021-04-11 | Disposition: A | Discharge status: Home / Self Care | Payer: Medicare Other | Source: Ambulatory Visit | Attending: Physician Assistant | Admitting: Physician Assistant

## 2021-04-11 DIAGNOSIS — M79605 Pain in left leg: Secondary | ICD-10-CM | POA: Insufficient documentation

## 2021-04-21 ENCOUNTER — Telehealth: Payer: Self-pay | Admitting: Internal Medicine

## 2021-04-23 NOTE — Telephone Encounter (Signed)
[name].  It looks like you pended this order ?

## 2021-04-27 NOTE — Telephone Encounter (Signed)
Patient has been scheduled and is aware

## 2021-05-05 ENCOUNTER — Other Ambulatory Visit: Payer: Self-pay

## 2021-05-05 ENCOUNTER — Ambulatory Visit (HOSPITAL_BASED_OUTPATIENT_CLINIC_OR_DEPARTMENT_OTHER)
Admission: RE | Admit: 2021-05-05 | Discharge: 2021-05-05 | Disposition: A | Discharge status: Home / Self Care | Payer: Medicare Other | Source: Ambulatory Visit | Attending: Internal Medicine | Admitting: Internal Medicine

## 2021-05-05 DIAGNOSIS — Z87891 Personal history of nicotine dependence: Secondary | ICD-10-CM | POA: Insufficient documentation

## 2021-05-05 DIAGNOSIS — R911 Solitary pulmonary nodule: Secondary | ICD-10-CM | POA: Diagnosis not present

## 2021-05-14 NOTE — Progress Notes (Signed)
? ?History of Present Illness ?[name] is a 81 y.o. male former smoker (22.5 pack years) Quit 1989, followed for dyspnea upon exertion.  He is a patient Dr. Golden Pop and was last seen in office on 03/17/2021.  Past medical history significant for PAF, allergic rhinitis, OSA on CPAP (followed by neurology), GERD, DM2, obesity, hypertriglyceridemia, status post knee replacement. PFT's reflective of ACOS. Started on Symbicort with improvement in symptoms.  ? ? ?05/15/2021 ?Pt. Presents for follow up. He is here to review CT Chest done as follow up to 01/21/2021.  CT Chest at that time was concerning for Scattered areas of nodular groundglass in the right lung measure up to 10 mm in the lateral segment right middle lobe.This was suspected to be post infectious or inflammatory. 3 month follow up CT Chest was ordered to follow up as was recommended by radiology. Per the most recent CT Chest, the nodule of concern is stable. There are several other smaller nodules noted with recommendation of Q 2 year follow up CT Chest for 5 years. With patient 's significant family history of cancer, we will follow him Q 1 year, next due 04/2022.  ? ?He is compliant with CPAP which is managed at another practice. He states he did have some throat irritation with symbicort. He did stop for 1 week, and the irritation has resolved. We will get patient a spacer to see if this helps with voice hoarseness and throat irritation. We will call him when we have some available. ? ?Test Results: ?CT Chest with contrast>> 04/2021>> Numerous tiny 2-5 mm subpleural pulmonary nodules are present in the periphery of both upper lungs. These nodules were not well seen on the prior study due to high resolution thick slice technique. The previously described scattered regions of gland gas attenuation airspace opacity in the right lung persist. Index lesions as follows: 0.5 cm posterior right upper lobe (image 60 series 4); 0.8 mm right upper lobe (image  81 series 4); 0.5 mm posteromedial right middle lobe (image 88 series 4) and a very subtle lesion in the left upper lobe measuring 0.5 mm on image 33 of series 4. Of note, the minor fissure is under developed. No pleural effusion or pneumothorax. Recommendation is for Q 2 year follow up CT's until 5 years of stability have been established.  ?CPAP titration: AHI 12.1/hr ?03/2019 echocardiogram: G1 DD, otherwise normal ?01/21/2021 HRCT chest: Atherosclerosis.  No lymphadenopathy.  Scattered areas of nodular groundglass in the right lung measure up to 10 mm in the lateral segment right middle lobe.  Millimetric upper lobe pulmonary nodules, similar and likely postinfectious in etiology.  Repeat CT in 3 to 6 months recommended.  No evidence of ILD. ?02/06/2021 PFTs: FVC 2.56 (73), FEV1 1.72 (70), ratio 67, TLC 5.04 (78), DLCO corrected 22.05 (99), +BD. 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  ? ?  ? ? ?  Latest Ref Rng & Units 01/06/2021  ? 10:04 AM 05/03/2019  ?  3:10 AM 04/24/2019  ?  2:28 PM  ?CBC  ?WBC 4.0 - 10.5 K/uL 5.5   9.5   5.7    ?Hemoglobin 13.0 - 17.0 g/dL 13.0   11.4   13.0    ?Hematocrit 39.0 - 52.0 % 37.5   35.2   39.1    ?Platelets 150.0 - 400.0 K/uL 244.0   171   213    ? ? ? ?  Latest Ref Rng & Units 11/22/2019  ?  8:28 AM 05/03/2019  ?  3:10 AM 04/24/2019  ?  2:28 PM  ?BMP  ?Glucose 65 - 99 mg/dL 176   260   150    ?BUN 8 - 27 mg/dL 16   19   24     ?Creatinine 0.76 - 1.27 mg/dL 0.92   1.02   1.23    ?BUN/Creat Ratio 10 - 24 17      ?Sodium 134 - 144 mmol/L 137   137   142    ?Potassium 3.5 - 5.2 mmol/L 4.4   4.0   4.8    ?Chloride 96 - 106 mmol/L 102   105   109    ?CO2 20 - 29 mmol/L 22   24   25     ?Calcium 8.6 - 10.2 mg/dL 9.1   8.6   9.3    ? ? ?BNP ?No results found for: BNP ? ?ProBNP ?   ?Component Value Date/Time  ? PROBNP 246.5 (H) 10/30/2010 2132  ? ? ?PFT ?   ?Component Value Date/Time  ? FEV1PRE 1.51 02/06/2021 0857  ? FEV1POST 1.72 02/06/2021 0857  ? FVCPRE 2.18 02/06/2021  0857  ? FVCPOST 2.56 02/06/2021 0857  ? [place] 20.99 02/06/2021 0857  ? PREFEV1FVCRT 69 02/06/2021 0857  ? PSTFEV1FVCRT 67 02/06/2021 0857  ? ? ?CT Chest Wo Contrast ? ?Result Date: 05/06/2021 ?CLINICAL DATA:  Lung nodule, high cancer risk EXAM: CT CHEST WITHOUT CONTRAST TECHNIQUE: Multidetector CT imaging of the chest was performed following the standard protocol without IV contrast. RADIATION DOSE REDUCTION: This exam was performed according to the departmental dose-optimization program which includes automated exposure control, adjustment of the mA and/or kV according to patient size and/or use of iterative reconstruction technique. COMPARISON:  Prior CT scan of the chest 01/21/2021 FINDINGS: Cardiovascular: Limited evaluation in the absence of intravenous contrast. Conventional 3 vessel arch anatomy. No evidence of aneurysm. Scattered atherosclerotic calcifications throughout the thoracic aorta and coronary arteries. The heart is normal in size. No pericardial effusion. Unremarkable size of the main pulmonary artery. Mediastinum/Nodes: Unremarkable CT appearance of the thyroid gland. No suspicious mediastinal or hilar adenopathy. No soft tissue mediastinal mass. The thoracic esophagus is unremarkable. Lungs/Pleura: Numerous tiny 2-5 mm subpleural pulmonary nodules are present in the periphery of both upper lungs. These nodules were not well seen on the prior study due to high resolution thick slice technique. The previously described scattered regions of gland gas attenuation airspace opacity in the right lung persist. Index lesions as follows: 0.5 cm posterior right upper lobe (image 60 series 4); 0.8 mm right upper lobe (image 81 series 4); 0.5 mm posteromedial right middle lobe (image 88 series 4) and a very subtle lesion in the left upper lobe measuring 0.5 mm on image 33 of series 4. Of note, the minor fissure is under developed. No pleural effusion or pneumothorax. Upper Abdomen: No acute abnormality.  Musculoskeletal: No acute fracture or aggressive appearing lytic or blastic osseous lesion. IMPRESSION: 1. Persistence of multifocal ground-glass attenuation nodular opacities the largest of which measures 0.8 cm. CT is recommended every 2 years until 5 years of stability has been established. This recommendation follows the consensus statement: Guidelines for Management of Incidental Pulmonary Nodules Detected on CT Images: From the Fleischner Society 2017; Radiology 2017; 284:228-243. 2. Please note that the minor fissure is under developed making differentiation between the upper and middle lobes difficult. 3. Numerous small 2-5 mm subpleural pulmonary nodules in the upper lungs have a benign appearance. 4.  Aortic and coronary artery atherosclerotic vascular calcifications. Aortic Atherosclerosis (ICD10-I70.0). Electronically Signed   By: Jacqulynn Cadet M.D.   On: 05/06/2021 06:06   ? ? ?Past medical hx ?Past Medical History:  ?Diagnosis Date  ? Arthritis   ? "hands; probably in my back too" (08/28/2015); osteo related  ? Barrett's esophagus with dysplasia   ? Chronic lower back pain   ? DDD  ? Closed patellar sleeve fracture of left knee   ? Constipation   ? Crush injury knee   ? bilateral  ? Dysphagia   ? EGD with dilitation  ? Dyspnea   ? walking long distance  ? Dysrhythmia   ? thought pt. had a fib after surgery saw cardiology later and ok meds preventive per pt.  ? Enlarged prostate   ? sees a urologist  ? Exertional dyspnea 03/21/2019  ? GERD (gastroesophageal reflux disease)   ? takes Omeprazole daily  ? Hard of hearing   ? Hyperlipidemia   ? takes Lipitor and Fish Oil daily  ? Insomnia   ? d/t pain  ? Joint pain   ? Joint swelling   ? Left tibial fracture   ? Multiple duodenal ulcers   ? Nocturia   ? OSA on CPAP   ? cpap  ? Pneumonia 2001  ? Pure hypercholesterolemia 05/22/2019  ? Type II diabetes mellitus ([place])   ? type 2  ? Unsteady gait   ? Urinary frequency   ? Wears glasses   ?  ? ?Social History   ? ?Tobacco Use  ? Smoking status: Former  ?  Packs/day: 1.50  ?  Years: 15.00  ?  Pack years: 22.50  ?  Types: Cigarettes  ?  Quit date: 1989  ?  Years since quitting: 34.2  ? Smokeless tobacco: Never  ?V

## 2021-05-15 ENCOUNTER — Encounter: Payer: Self-pay | Admitting: Acute Care

## 2021-05-15 ENCOUNTER — Ambulatory Visit (INDEPENDENT_AMBULATORY_CARE_PROVIDER_SITE_OTHER): Payer: Medicare Other | Admitting: Acute Care

## 2021-05-15 VITALS — BP 126/68 | HR 62 | Temp 97.7°F | Ht 67.0 in | Wt 215.8 lb

## 2021-05-15 DIAGNOSIS — G4733 Obstructive sleep apnea (adult) (pediatric): Secondary | ICD-10-CM

## 2021-05-15 DIAGNOSIS — J449 Chronic obstructive pulmonary disease, unspecified: Secondary | ICD-10-CM | POA: Diagnosis not present

## 2021-05-15 DIAGNOSIS — Z9989 Dependence on other enabling machines and devices: Secondary | ICD-10-CM

## 2021-05-15 DIAGNOSIS — R911 Solitary pulmonary nodule: Secondary | ICD-10-CM

## 2021-05-15 MED ORDER — AEROCHAMBER MV MISC: 0 refills | Status: DC

## 2021-05-15 NOTE — Patient Instructions (Addendum)
It is good to see you Today ?Follow up CT Chest with contrast in 1 year 04/2022.  ?Continue Symbicort 2 puffs Twice daily  ?Rinse mouth after use ?We will add a spacer for throat irritability ?We are out but will call when we have some available, so you can come and pick one up.  ?If this does not help with throat irritation, we can consider dropping dose to 80.  ?We can consider dropping to Symbicort 80 to see if hoarseness is better and dyspnea is still controlled.  ?Follow up with Dr. Marchelle Gearing or NP in 12 months after repeat CT Chest or before as needed.  ?Continue on CPAP at bedtime. You appear to be benefiting from the treatment  ?Goal is to wear for at least 6 hours each night for maximal clinical benefit. ?Continue to work on weight loss, as the link between excess weight  and sleep apnea is well established.   ?Remember to establish a good bedtime routine, and work on sleep hygiene.  ?Limit daytime naps , avoid stimulants such as caffeine and nicotine close to bedtime, exercise daily to promote sleep quality, avoid heavy , spicy, fried , or rich foods before bed. Ensure adequate exposure to natural light during the day,establish a relaxing bedtime routine with a pleasant sleep environment ( Bedroom between 60 and 67 degrees, turn off bright lights , TV or device screens screens , consider black out curtains or white noise machines) ?Do not drive if sleepy. ?Remember to clean mask, tubing, filter, and reservoir once weekly with soapy water.  ?Follow up with MD who manages CPAP   In  12 months  or before as needed. ?Please contact office for sooner follow up if symptoms do not improve or worsen or seek emergency care      ?

## 2021-05-29 ENCOUNTER — Ambulatory Visit: Payer: Medicare Other | Admitting: Acute Care

## 2021-06-29 ENCOUNTER — Telehealth: Payer: Self-pay | Admitting: *Deleted

## 2021-06-29 NOTE — Telephone Encounter (Signed)
   Pre-operative Risk Assessment    Patient Name: [name]  DOB: 01/08/1941 MRN: [id number]      Request for Surgical Clearance    Procedure:   LEFT KNEE ARTHROTOMY: SCAR EXCISION  Date of Surgery:  Clearance 08/12/21                                 Surgeon:  DR. Ollen Gross Surgeon's Group or Practice Name:  [facility] Phone number:  [phone] Fax number:  [phone] ATTN: Aida Raider   Type of Clearance Requested:   - Medical ; NEED TO HOLD ASA   Type of Anesthesia:   CHOICE   Additional requests/questions:    Elpidio Anis   06/29/2021, 5:41 PM

## 2021-06-30 NOTE — Telephone Encounter (Signed)
Primary [place], MD  Chart reviewed as part of pre-operative protocol coverage. Because of [name]'s past medical history and time since last visit, he/she will require a virtual visit/telephone call in order to better assess preoperative cardiovascular risk.  Pre-op covering staff: - Please contact patient, obtain consent, and schedule appointment   If applicable, this message will also be routed to pharmacy pool and/or primary cardiologist for input on holding anticoagulant/antiplatelet agent as requested below so that this information is available at time of patient's appointment.   Emmaline Life, NP-C    06/30/2021, 11:31 AM [place] [id number] Office [phone] Fax [phone]

## 2021-07-01 ENCOUNTER — Telehealth: Payer: Self-pay | Admitting: *Deleted

## 2021-07-01 NOTE — Telephone Encounter (Signed)
Pt agreeable to plan of care for tele pre op appt 07/27/21 at 10 am. Pt did not have to go over his medications, though consent has been given.

## 2021-07-01 NOTE — Telephone Encounter (Signed)
Pt agreeable to plan of care for tele pre op appt 07/27/21 at 10 am. Pt did not have to go over his medications, though consent has been given.     Patient Consent for Virtual Visit        [name] has provided verbal consent on 07/01/2021 for a virtual visit (video or telephone).   CONSENT FOR VIRTUAL VISIT FOR:  [name]  By participating in this virtual visit I agree to the following:  I hereby voluntarily request, consent and authorize [facility] and its employed or contracted physicians, physician assistants, nurse practitioners or other licensed health care professionals (the Practitioner), to provide me with telemedicine health care services (the "Services") as deemed necessary by the treating Practitioner. I acknowledge and consent to receive the Services by the Practitioner via telemedicine. I understand that the telemedicine visit will involve communicating with the Practitioner through live audiovisual communication technology and the disclosure of certain medical information by electronic transmission. I acknowledge that I have been given the opportunity to request an in-person assessment or other available alternative prior to the telemedicine visit and am voluntarily participating in the telemedicine visit.  I understand that I have the right to withhold or withdraw my consent to the use of telemedicine in the course of my care at any time, without affecting my right to future care or treatment, and that the Practitioner or I may terminate the telemedicine visit at any time. I understand that I have the right to inspect all information obtained and/or recorded in the course of the telemedicine visit and may receive copies of available information for a reasonable fee.  I understand that some of the potential risks of receiving the Services via telemedicine include:  Delay or interruption in medical evaluation due to technological equipment failure or  disruption; Information transmitted may not be sufficient (e.g. poor resolution of images) to allow for appropriate medical decision making by the Practitioner; and/or  In rare instances, security protocols could fail, causing a breach of personal health information.  Furthermore, I acknowledge that it is my responsibility to provide information about my medical history, conditions and care that is complete and accurate to the best of my ability. I acknowledge that Practitioner's advice, recommendations, and/or decision may be based on factors not within their control, such as incomplete or inaccurate data provided by me or distortions of diagnostic images or specimens that may result from electronic transmissions. I understand that the practice of medicine is not an exact science and that Practitioner makes no warranties or guarantees regarding treatment outcomes. I acknowledge that a copy of this consent can be made available to me via my patient portal [facility] MyChart), or I can request a printed copy by calling the office of [facility].    I understand that my insurance will be billed for this visit.   I have read or had this consent read to me. I understand the contents of this consent, which adequately explains the benefits and risks of the Services being provided via telemedicine.  I have been provided ample opportunity to ask questions regarding this consent and the Services and have had my questions answered to my satisfaction. I give my informed consent for the services to be provided through the use of telemedicine in my medical care

## 2021-07-01 NOTE — Telephone Encounter (Signed)
Will update requesting provider the pt has a telephone appt 07/27/21 for pre op clearance

## 2021-07-27 ENCOUNTER — Ambulatory Visit (INDEPENDENT_AMBULATORY_CARE_PROVIDER_SITE_OTHER): Payer: Medicare Other | Admitting: General Practice

## 2021-07-27 DIAGNOSIS — Z01810 Encounter for preprocedural cardiovascular examination: Secondary | ICD-10-CM

## 2021-07-27 NOTE — Progress Notes (Signed)
DUE TO COVID-19 ONLY ONE VISITOR IS ALLOWED TO COME WITH YOU AND STAY IN THE WAITING ROOM ONLY DURING PRE OP AND PROCEDURE DAY OF SURGERY.  2 VISITOR  MAY VISIT WITH YOU AFTER SURGERY IN YOUR PRIVATE ROOM DURING VISITING HOURS ONLY! YOU MAY HAVE ONE PERSON SPEND THE NITE WITH YOU IN YOUR ROOM AFTER SURGERY.     Your procedure is scheduled on:        08/12/2021   Report to [facility] Main  Entrance   Report to admitting at     230 pm             DO NOT BRING INSURANCE CARD, PICTURE ID OR WALLET DAY OF SURGERY.      Call this number if you have problems the morning of surgery [phone]    REMEMBER: NO  SOLID FOODS , CANDY, GUM OR MINTS AFTER MIDNITE THE NITE BEFORE SURGERY .       [name] Kitchen CLEAR LIQUIDS UNTIL    130pm             DAY OF SURGERY.      PLEASE FINISH  g2 lower sugar  DRINK PER SURGEON ORDER  WHICH NEEDS TO BE COMPLETED AT    130 pm        day OF SURGERY.       CLEAR LIQUID DIET   Foods Allowed      WATER BLACK COFFEE ( SUGAR OK, NO MILK, CREAM OR CREAMER) REGULAR AND DECAF  TEA ( SUGAR OK NO MILK, CREAM, OR CREAMER) REGULAR AND DECAF  PLAIN JELLO ( NO RED)  FRUIT ICES ( NO RED, NO FRUIT PULP)  POPSICLES ( NO RED)  JUICE- APPLE, WHITE GRAPE AND WHITE CRANBERRY  SPORT DRINK LIKE GATORADE ( NO RED)  CLEAR BROTH ( VEGETABLE , CHICKEN OR BEEF)                                                                     BRUSH YOUR TEETH MORNING OF SURGERY AND RINSE YOUR MOUTH OUT, NO CHEWING GUM CANDY OR MINTS.     Take these medicines the morning of surgery with A SIP OF WATER:  inhalers as usu and bring, synthroid, claritin, toprol, prilosec    DO NOT TAKE ANY DIABETIC MEDICATIONS DAY OF YOUR SURGERY                               You may not have any metal on your body including hair pins and              piercings  Do not wear jewelry, make-up, lotions, powders or perfumes, deodorant             Do not wear nail polish on your fingernails.              IF YOU ARE A  MALE AND WANT TO SHAVE UNDER ARMS OR LEGS PRIOR TO SURGERY YOU MUST DO SO AT LEAST 48 HOURS PRIOR TO SURGERY.              Men may shave face and neck.   Do not bring valuables to the hospital. [facility]  IS NOT             RESPONSIBLE   FOR VALUABLES.  Contacts, dentures or bridgework may not be worn into surgery.  Leave suitcase in the car. After surgery it may be brought to your room.     Patients discharged the day of surgery will not be allowed to drive home. IF YOU ARE HAVING SURGERY AND GOING HOME THE SAME DAY, YOU MUST HAVE AN ADULT TO DRIVE YOU HOME AND BE WITH YOU FOR 24 HOURS. YOU MAY GO HOME BY TAXI OR UBER OR ORTHERWISE, BUT AN ADULT MUST ACCOMPANY YOU HOME AND STAY WITH YOU FOR 24 HOURS.                Please read over the following fact sheets you were given: _____________________________________________________________________  [facility] - Preparing for Surgery Before surgery, you can play an important role.  Because skin is not sterile, your skin needs to be as free of germs as possible.  You can reduce the number of germs on your skin by washing with CHG (chlorahexidine gluconate) soap before surgery.  CHG is an antiseptic cleaner which kills germs and bonds with the skin to continue killing germs even after washing. Please DO NOT use if you have an allergy to CHG or antibacterial soaps.  If your skin becomes reddened/irritated stop using the CHG and inform your nurse when you arrive at Short Stay. Do not shave (including legs and underarms) for at least 48 hours prior to the first CHG shower.  You may shave your face/neck. Please follow these instructions carefully:  1.  Shower with CHG Soap the night before surgery and the  morning of Surgery.  2.  If you choose to wash your hair, wash your hair first as usual with your  normal  shampoo.  3.  After you shampoo, rinse your hair and body thoroughly to remove the  shampoo.                           4.  Use CHG as you would  any other liquid soap.  You can apply chg directly  to the skin and wash                       Gently with a scrungie or clean washcloth.  5.  Apply the CHG Soap to your body ONLY FROM THE NECK DOWN.   Do not use on face/ open                           Wound or open sores. Avoid contact with eyes, ears mouth and genitals (private parts).                       Wash face,  Genitals (private parts) with your normal soap.             6.  Wash thoroughly, paying special attention to the area where your surgery  will be performed.  7.  Thoroughly rinse your body with warm water from the neck down.  8.  DO NOT shower/wash with your normal soap after using and rinsing off  the CHG Soap.                9.  Pat yourself dry with a clean towel.  10.  Wear clean pajamas.            11.  Place clean sheets on your bed the night of your first shower and do not  sleep with pets. Day of Surgery : Do not apply any lotions/deodorants the morning of surgery.  Please wear clean clothes to the hospital/surgery center.  FAILURE TO FOLLOW THESE INSTRUCTIONS MAY RESULT IN THE CANCELLATION OF YOUR SURGERY PATIENT SIGNATURE_________________________________  NURSE SIGNATURE__________________________________  ________________________________________________________________________

## 2021-07-27 NOTE — Progress Notes (Signed)
Virtual Visit via Telephone Note   Because of [name]'s co-morbid illnesses, he is at least at moderate risk for complications without adequate follow up.  This format is felt to be most appropriate for this patient at this time.  The patient did not have access to video technology/had technical difficulties with video requiring transitioning to audio format only (telephone).  All issues noted in this document were discussed and addressed.  No physical exam could be performed with this format.  Please refer to the patient's chart for his consent to telehealth for [facility].  Evaluation Performed:  Preoperative cardiovascular risk assessment _____________   Date:  07/27/2021   Patient ID:  [name], DOB [birth year], MRN [id number] Patient Location:  Home Provider location:   Office  Primary Care Provider:  Janie Morning, DO Primary Cardiologist:  [name], MD  Chief Complaint / Patient Profile   81 y.o. y/o male with a h/o paroxysmal atrial fibrillation, OSA, exertional dyspnea, HLD who is pending left knee arthrotomy; scar excision and presents today for telephonic preoperative cardiovascular risk assessment.  Past Medical History    Past Medical History:  Diagnosis Date   Arthritis    "hands; probably in my back too" (08/28/2015); osteo related   Barrett's esophagus with dysplasia    Chronic lower back pain    DDD   Closed patellar sleeve fracture of left knee    Constipation    Crush injury knee    bilateral   Dysphagia    EGD with dilitation   Dyspnea    walking long distance   Dysrhythmia    thought pt. had a fib after surgery saw cardiology later and ok meds preventive per pt.   Enlarged prostate    sees a urologist   Exertional dyspnea 03/21/2019   GERD (gastroesophageal reflux disease)    takes Omeprazole daily   Hard of hearing    Hyperlipidemia    takes Lipitor and Fish Oil daily   Insomnia    d/t pain   Joint pain    Joint  swelling    Left tibial fracture    Multiple duodenal ulcers    Nocturia    OSA on CPAP    cpap   Pneumonia 2001   Pure hypercholesterolemia 05/22/2019   Type II diabetes mellitus ([place])    type 2   Unsteady gait    Urinary frequency    Wears glasses    Past Surgical History:  Procedure Laterality Date   ANTERIOR CERVICAL DECOMP/DISCECTOMY FUSION  10/2010   ANTERIOR LAT LUMBAR FUSION  12/02/2011   Procedure: ANTERIOR LATERAL LUMBAR FUSION 1 LEVEL;  Surgeon: Eustace Moore, MD;  Location: [facility];  Service: Neurosurgery;  Laterality: Left;  left lumbar three-four   BACK SURGERY     CATARACT EXTRACTION, BILATERAL Bilateral    about a month apart.    COLONOSCOPY  04/2007   ESOPHAGOGASTRODUODENOSCOPY     ESOPHAGOGASTRODUODENOSCOPY  04/2007; 07/2008   ESOPHAGOGASTRODUODENOSCOPY (EGD) WITH ESOPHAGEAL DILATION  "2-3 times"   EXCISIONAL HEMORRHOIDECTOMY  ~ [place] Left 12/08/2016   Procedure: LEFT KNEE FEMORAL REVISION;  Surgeon: Gaynelle Arabian, MD;  Location: [facility];  Service: Orthopedics;  Laterality: Left;  Adductor Block   FINGER SURGERY Right    pinky finger   JOINT REPLACEMENT     KNEE ARTHROTOMY Left 07/24/2018   Procedure: Left knee arthrotomy with scar excision;  Surgeon: Gaynelle Arabian, MD;  [facility]: [facility];  Service: Orthopedics;  Laterality: Left;   KNEE ARTHROTOMY Left 05/02/2019   Procedure: Left knee arthrotomy; scar excision;  Surgeon: Gaynelle Arabian, MD;  [facility];  Service: Orthopedics;  Laterality: Left;  93mn   KNEE CLOSED REDUCTION Left 06/21/2016   Procedure: CLOSED MANIPULATION LEFT KNEE;  Surgeon: AGaynelle Arabian MD;  [facility];  Service: Orthopedics;  Laterality: Left;   KNEE CLOSED REDUCTION Left 02/28/2017   Procedure: CLOSED MANIPULATION KNEE;  Surgeon: AGaynelle Arabian MD;  [facility];  Service: Orthopedics;  Laterality: Left;   KNEE JOINT MANIPULATION  06/2009   LAPAROSCOPIC CHOLECYSTECTOMY  02/2000   LUMBAR FUSION   03/16/2012   Dr JRonnald Ramp  ORIF TIBIA PLATEAU Left 09/24/2015   Procedure: OPEN REDUCTION INTERNAL FIXATION (ORIF) LEFT TIBIAL PLATEAU;  Surgeon: FGaynelle Arabian MD;  [facility];  Service: Orthopedics;  Laterality: Left;   PARTIAL KNEE ARTHROPLASTY Left 07/30/2015   Procedure: UNICOMPARTMENTAL LEFT KNEE MEDIAL;  Surgeon: FGaynelle Arabian MD;  [facility];  Service: Orthopedics;  Laterality: Left;   POSTERIOR LUMBAR FUSION  04/2015   rods inserted L2   REFRACTIVE SURGERY Right 1980s   SHOULDER OPEN ROTATOR CUFF REPAIR Bilateral 1990s   TENNIS ELBOW RELEASE/NIRSCHEL PROCEDURE Right 1980s   TOTAL KNEE ARTHROPLASTY Right 03/2009   TOTAL KNEE ARTHROPLASTY WITH REVISION COMPONENTS Left 03/03/2016   Procedure: REVISION LEFT KNEE UNICOMPARTMENTAL ARTHROPLASTY TO TOTAL KNEE ARTHROPLASTY;  Surgeon: FGaynelle Arabian MD;  [facility];  Service: Orthopedics;  Laterality: Left;  requests 2hrs; Adductor Block    Allergies  No Known Allergies  History of Present Illness    [name] JARRETTis a 81y.o. male who presents via audio/video conferencing for a telehealth visit today.  Pt was last seen in cardiology clinic on 12/22/2020 by Dr. ROval Linsey  At that time [name] HAZELIPwas doing well .  The patient is now pending procedure as outlined above. Since his last visit, he remains stable from a cardiac standpoint.  Today he denies chest pain, shortness of breath, lower extremity edema, fatigue, palpitations, melena, hematuria, hemoptysis, diaphoresis, weakness, presyncope, syncope, orthopnea, and PND.    Home Medications    Prior to Admission medications   Medication Sig Start Date End Date Taking? Authorizing Provider  albuterol (VENTOLIN HFA) 108 (90 Base) MCG/ACT inhaler Inhale 2 puffs into the lungs every 6 (six) hours as needed for wheezing or shortness of breath. 02/06/21   Cobb, KKarie Schwalbe NP  aspirin EC 81 MG tablet Take 81 mg by mouth daily. Swallow whole.    [provider]   atorvastatin (LIPITOR) 80 MG tablet Take 40-80 mg by mouth See admin instructions. Takes 80 mg by mouth daily on Monday, Wednesday, Fridays and take 40 mg by mouth on Tue, Thurs, Sat, and Sun    [provider]  budesonide-formoterol (SYMBICORT) 160-4.5 MCG/ACT inhaler Inhale 2 puffs into the lungs 2 (two) times daily. 02/06/21   Cobb, KKarie Schwalbe NP  docusate sodium (COLACE) 100 MG capsule Take 200 mg by mouth at bedtime.    [provider]  fenofibrate (TRICOR) 145 MG tablet Take 1 tablet by mouth once daily 01/20/21   RSkeet Latch MD  finasteride (PROSCAR) 5 MG tablet Take 5 mg by mouth every evening.     [provider]  glimepiride (AMARYL) 2 MG tablet Take 2 mg by mouth daily with breakfast.    [provider]  levothyroxine (SYNTHROID) 50 MCG tablet Take 50-100 mcg by mouth See  admin instructions. Take 100 mcg by mouth daily on Monday, Wednesday and Friday. Take 50 mcg by mouth daily on Tue, Thurs, Sat, and Sun    [provider]  loratadine (CLARITIN) 10 MG tablet Take 1 tablet (10 mg total) by mouth daily. 02/06/21   Cobb, Karie Schwalbe, NP  methocarbamol (ROBAXIN) 500 MG tablet Take 1 tablet by mouth daily.    [provider]  Methylcellulose, Laxative, (CITRUCEL PO) Take 2 tablets by mouth daily.    [provider]  metoprolol succinate (TOPROL-XL) 25 MG 24 hr tablet Take 50 mg by mouth daily.    [provider]  omeprazole (PRILOSEC) 20 MG capsule Take 20 mg by mouth daily.    [provider]  oxyCODONE (OXY IR/ROXICODONE) 5 MG immediate release tablet Take 1 tablet (5 mg total) by mouth every 4 (four) hours as needed for moderate pain or severe pain. Patient taking differently: Take 5 mg by mouth daily. 10/20/18   Eustace Moore, MD  pioglitazone-metformin (ACTOPLUS MET) 15-500 MG per tablet Take 1 tablet by mouth daily.     [provider]  Saw Palmetto 450 MG CAPS Take 1 capsule by mouth  daily.    [provider]  Spacer/Aero-Holding Chambers (AEROCHAMBER MV) inhaler Use as instructed 05/15/21   Magdalen Spatz, NP  Turmeric (QC TUMERIC COMPLEX PO) Take 2 tablets by mouth daily.    [provider]    Physical Exam    Vital Signs:  CALDER OBLINGER does not have vital signs available for review today.  Given telephonic nature of communication, physical exam is limited. AAOx3. NAD. Normal affect.  Speech and respirations are unlabored.  Accessory Clinical Findings    None  Assessment & Plan    1.  Preoperative Cardiovascular Risk Assessment: 08/12/2021, Dr. Gaynelle Arabian, left knee arthrotomy; scar excision     Primary Cardiologist: [name], MD  Chart reviewed as part of pre-operative protocol coverage. Given past medical history and time since last visit, based on ACC/AHA guidelines, LARENZO CAPLES would be at acceptable risk for the planned procedure without further cardiovascular testing.   Patient was advised that if he develops new symptoms prior to surgery to contact our office to arrange a follow-up appointment.  He verbalized understanding.   His aspirin may be held for 5-7 days prior to his surgery.  Please resume as soon as hemostasis is achieved.  His RCRI is a class I risk, 0.4% risk of major cardiac event.  He is able to complete greater than 4 METS of physical activity.   A copy of this note will be routed to requesting surgeon.  Time:   Today, I have spent 5 minutes with the patient with telehealth technology discussing medical history, symptoms, and management plan.  Prior to his phone evaluation I spent greater than 10 minutes reviewing his past medical history and medications.   Deberah Pelton, NP  07/27/2021, 8:10 AM

## 2021-07-27 NOTE — Progress Notes (Signed)
Anesthesia Review:  PCP: Cardiologist : [name] preop exam on 07/27/21  Chest x-ray : EKG : 12/22/20  Echo : 2021  Stress test:2021  Cardiac Cath :  Activity level:  Sleep Study/ CPAP : Fasting Blood Sugar :      / Checks Blood Sugar -- times a day:   Blood Thinner/ Instructions /Last Dose: ASA / Instructions/ Last Dose :   DM- type  Hgba1c-  81 mg aspirin

## 2021-07-29 NOTE — H&P (Addendum)
ADMISSION H&P  Patient is being admitted for left knee arthrotomy and scar excision.  Subjective:  Chief Complaint: Left knee pain.  HPI: [name], 81 y.o. male presents today for arthrotomy and scar excision of the left knee. Status post left total knee arthroplasty revision. He presented to clinic last month due to continued pain and issues with flexion/stiffness. Denies any history of fever or chills.   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   Paroxysmal atrial fibrillation ([facility])    Periprosthetic fracture of proximal end of tibia 09/24/2015   Cough 08/31/2015   Crush injury knee 08/28/2015   Diabetes mellitus, type 2 ([place]) 08/28/2015   Hypertriglyceridemia 08/28/2015   GERD without esophagitis 08/28/2015   Crush injury lower leg 08/28/2015   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 had a fib after surgery saw cardiology later and ok meds preventive per pt.   Enlarged prostate    sees a urologist   Exertional dyspnea 03/21/2019   GERD  (gastroesophageal reflux disease)    takes Omeprazole daily   Hard of hearing    Hyperlipidemia    takes Lipitor and Fish Oil daily   Insomnia    d/t pain   Joint pain    Joint swelling    Left tibial fracture    Multiple duodenal ulcers    Nocturia    OSA on CPAP    cpap   Pneumonia 2001   Pure hypercholesterolemia 05/22/2019   Type II diabetes mellitus ([place])    type 2   Unsteady gait    Urinary frequency    Wears glasses     Past Surgical History:  Procedure Laterality Date   ANTERIOR CERVICAL DECOMP/DISCECTOMY FUSION  10/2010   ANTERIOR LAT LUMBAR FUSION  12/02/2011   Procedure: ANTERIOR LATERAL LUMBAR FUSION 1 LEVEL;  Surgeon: Eustace Moore, MD;  Location: [facility];  Service: Neurosurgery;  Laterality: Left;  left lumbar three-four   BACK SURGERY     CATARACT EXTRACTION, BILATERAL Bilateral    about a month apart.    COLONOSCOPY  04/2007   ESOPHAGOGASTRODUODENOSCOPY     ESOPHAGOGASTRODUODENOSCOPY  04/2007; 07/2008   ESOPHAGOGASTRODUODENOSCOPY (EGD) WITH ESOPHAGEAL DILATION  "2-3 times"   EXCISIONAL HEMORRHOIDECTOMY  ~ [place] Left 12/08/2016   Procedure: LEFT KNEE FEMORAL REVISION;  Surgeon: Gaynelle Arabian, MD;  [facility];  Service: Orthopedics;  Laterality: Left;  Adductor Block   FINGER SURGERY Right  pinky finger   JOINT REPLACEMENT     KNEE ARTHROTOMY Left 07/24/2018   Procedure: Left knee arthrotomy with scar excision;  Surgeon: Gaynelle Arabian, MD;  [facility];  Service: Orthopedics;  Laterality: Left;   KNEE ARTHROTOMY Left 05/02/2019   Procedure: Left knee arthrotomy; scar excision;  Surgeon: Gaynelle Arabian, MD;  [facility];  Service: Orthopedics;  Laterality: Left;  51mn   KNEE CLOSED REDUCTION Left 06/21/2016   Procedure: CLOSED MANIPULATION LEFT KNEE;  Surgeon: AGaynelle Arabian MD;  [facility];  Service: Orthopedics;  Laterality: Left;   KNEE CLOSED REDUCTION Left 02/28/2017   Procedure: CLOSED MANIPULATION KNEE;   Surgeon: AGaynelle Arabian MD;  [facility];  Service: Orthopedics;  Laterality: Left;   KNEE JOINT MANIPULATION  06/2009   LAPAROSCOPIC CHOLECYSTECTOMY  02/2000   LUMBAR FUSION  03/16/2012   Dr JRonnald Ramp  ORIF TIBIA PLATEAU Left 09/24/2015   Procedure: OPEN REDUCTION INTERNAL FIXATION (ORIF) LEFT TIBIAL PLATEAU;  Surgeon: FGaynelle Arabian MD;  [facility];  Service: Orthopedics;  Laterality: Left;   PARTIAL KNEE ARTHROPLASTY Left 07/30/2015   Procedure: UNICOMPARTMENTAL LEFT KNEE MEDIAL;  Surgeon: FGaynelle Arabian MD;  [facility];  Service: Orthopedics;  Laterality: Left;   POSTERIOR LUMBAR FUSION  04/2015   rods inserted L2   REFRACTIVE SURGERY Right 1980s   SHOULDER OPEN ROTATOR CUFF REPAIR Bilateral 1990s   TENNIS ELBOW RELEASE/NIRSCHEL PROCEDURE Right 1980s   TOTAL KNEE ARTHROPLASTY Right 03/2009   TOTAL KNEE ARTHROPLASTY WITH REVISION COMPONENTS Left 03/03/2016   Procedure: REVISION LEFT KNEE UNICOMPARTMENTAL ARTHROPLASTY TO TOTAL KNEE ARTHROPLASTY;  Surgeon: FGaynelle Arabian MD;  [facility];  Service: Orthopedics;  Laterality: Left;  requests 2hrs; Adductor Block    Prior to Admission medications   Medication Sig Start Date End Date Taking? Authorizing Provider  albuterol (VENTOLIN HFA) 108 (90 Base) MCG/ACT inhaler Inhale 2 puffs into the lungs every 6 (six) hours as needed for wheezing or shortness of breath. 02/06/21  Yes Cobb, KKarie Schwalbe NP  aspirin EC 81 MG tablet Take 81 mg by mouth daily. Swallow whole.   Yes [provider]  atorvastatin (LIPITOR) 80 MG tablet Take 40-80 mg by mouth See admin instructions. Takes 80 mg by mouth daily on Monday, Wednesday, Fridays and take 40 mg by mouth on Tue, Thurs, Sat, and Sun   Yes [provider]  docusate sodium (COLACE) 100 MG capsule Take 200 mg by mouth at bedtime.   Yes [provider]  fenofibrate (TRICOR) 145 MG tablet Take 1 tablet by mouth once daily 01/20/21  Yes RSkeet Latch MD  glipiZIDE  (GLUCOTROL) 5 MG tablet Take 5 mg by mouth every morning. 07/17/21  Yes [provider]  Glucosamine-Chondroit-Vit C-Mn (GLUCOSAMINE 1500 COMPLEX PO) Take 1 capsule by mouth daily.   Yes [provider]  levothyroxine (SYNTHROID) 75 MCG tablet Take 75-150 mcg by mouth See admin instructions. Take 1566m by mouth daily on Monday, Wednesday and Friday and take 75 mcg by mouth daily on Tue, Thurs, Sat, and Sun   Yes [provider]  Multiple Vitamins-Minerals (MENS MULTIVITAMIN) TABS Take 1 tablet by mouth daily.   Yes [provider]  NON FORMULARY Pt uses a cpap nightly   Yes [provider]  omeprazole (PRILOSEC) 20 MG capsule Take 20 mg by mouth daily.   Yes [provider]  oxyCODONE (OXY IR/ROXICODONE) 5 MG immediate release tablet Take 1 tablet (5 mg total) by mouth every 4 (  four) hours as needed for moderate pain or severe pain. Patient taking differently: Take 5 mg by mouth in the morning and at bedtime. 10/20/18  Yes Eustace Moore, MD  pioglitazone-metformin (ACTOPLUS MET) 15-500 MG per tablet Take 1 tablet by mouth daily.    Yes [provider]  Saw Palmetto 450 MG CAPS Take 450 mg by mouth daily.   Yes [provider]  budesonide-formoterol (SYMBICORT) 160-4.5 MCG/ACT inhaler Inhale 2 puffs into the lungs 2 (two) times daily. Patient not taking: Reported on 07/27/2021 02/06/21   Clayton Bibles, NP  loratadine (CLARITIN) 10 MG tablet Take 1 tablet (10 mg total) by mouth daily. Patient not taking: Reported on 07/27/2021 02/06/21   Clayton Bibles, NP  Spacer/Aero-Holding Josiah Lobo (AEROCHAMBER MV) inhaler Use as instructed 05/15/21   Magdalen Spatz, NP    No Known Allergies  Social History   Socioeconomic History   Marital status: Married    Spouse name: Not on file   Number of children: 6   Years of education: 16   Highest education level: Bachelor's degree (e.g., BA, AB, BS)  Occupational History   Occupation:  Part-time office work  Tobacco Use   Smoking status: Former    Packs/day: 1.50    Years: 15.00    Total pack years: 22.50    Types: Cigarettes    Quit date: 1989    Years since quitting: 34.4   Smokeless tobacco: Never  Vaping Use   Vaping Use: Never used  Substance and Sexual Activity   Alcohol use: Not Currently    Comment: beer occasionally   Drug use: No   Sexual activity: Not Currently  Other Topics Concern   Not on file  Social History Narrative   Lives at home with his wife.   Right-handed.   3 cups caffeine per day.   Social Determinants of Health   Financial Resource Strain: Not on file  Food Insecurity: Not on file  Transportation Needs: Not on file  Physical Activity: Not on file  Stress: Not on file  Social Connections: Not on file  Intimate Partner Violence: Not on file    Tobacco Use: Medium Risk (07/27/2021)   Patient History    Smoking Tobacco Use: Former    Smokeless Tobacco Use: Never    Passive Exposure: Not on file   Social History   Substance and Sexual Activity  Alcohol Use Not Currently   Comment: beer occasionally    Family History  Problem Relation Age of Onset   Diabetes Brother    Lung cancer Brother    Stomach cancer Mother    Lung cancer Father    Lung cancer Brother    Hodgkin's lymphoma Brother    Lung cancer Brother     Review of Systems  Constitutional:  Negative for chills and fever.  HENT: Negative.    Eyes: Negative.   Respiratory:  Negative for cough and shortness of breath.   Cardiovascular:  Negative for chest pain and palpitations.  Gastrointestinal:  Negative for abdominal pain, constipation, diarrhea, nausea and vomiting.  Genitourinary:  Negative for dysuria, frequency and urgency.  Musculoskeletal:  Positive for joint pain.  Skin:  Negative for rash.    Objective:  Physical Exam: The patient is a pleasant, well-developed male alert and oriented in no apparent distress.  The patient has an antalgic  gait and ambulates independently.  Left knee exam: There is no warmth or effusion. The range of motion: 15 to 85  degrees actively, 90 degrees passively. The knee is stable.  The patient's sensation and motor function are intact in their lower extremities. Their distal pulses are 2+. The bilateral calves are soft and nontender.  Vital signs in last 24 hours:    Imaging Review AP and lateral x-rays of the left knee dated 06/26/2021 demonstrates a revision prosthesis in good position with no periprosthetic abnormalities. There is some heterotopic bone posteriorly in the posterior capsule.  Assessment/Plan:  We discussed the patient's presenting complaints, history, and treatment options. Unfortunately, he has had a little more loss of motion compared to where he was previously. He is having more discomfort with this, and the stiffness is affecting other areas. At this point, we have discussed other options. He does not need a knee revision. We did discuss going in and resecting scar tissue again, and he really would like to do that in an attempt to get at least some more flexion. I think we may be able to able to get 10 to 20 more degrees of flexion, which would be a huge improvement for him. I do not think we can get any more extension, as he has a lot of scarring on the posterior capsule, and resecting that will put him at risk for neurovascular damage if we try to do anything posteriorly. He is in favor of doing the scar excision. Risks and benefits of the procedure were discussed in detail with the patient and he elects to proceed.  - Patient was instructed on what medications to stop prior to surgery. - Follow-up visit in 2 weeks with Dr. Wynelle Link - Begin physical therapy following surgery - Pre-operative lab work as pre-surgical testing - Prescriptions will be provided in hospital at time of discharge  R. Jaynie Bream, PA-C Orthopedic Surgery [facility] [place]

## 2021-07-31 ENCOUNTER — Encounter (HOSPITAL_COMMUNITY)
Admission: RE | Admit: 2021-07-31 | Discharge: 2021-07-31 | Disposition: A | Discharge status: Home / Self Care | Payer: Medicare Other | Source: Ambulatory Visit | Attending: Orthopedic Surgery | Admitting: Orthopedic Surgery

## 2021-07-31 ENCOUNTER — Encounter (HOSPITAL_COMMUNITY): Payer: Self-pay

## 2021-07-31 ENCOUNTER — Other Ambulatory Visit: Payer: Self-pay

## 2021-07-31 VITALS — BP 126/70 | HR 57 | Temp 97.9°F | Resp 16 | Ht 67.0 in | Wt 218.0 lb

## 2021-07-31 DIAGNOSIS — Z87891 Personal history of nicotine dependence: Secondary | ICD-10-CM | POA: Diagnosis not present

## 2021-07-31 DIAGNOSIS — G4733 Obstructive sleep apnea (adult) (pediatric): Secondary | ICD-10-CM | POA: Insufficient documentation

## 2021-07-31 DIAGNOSIS — Z01812 Encounter for preprocedural laboratory examination: Secondary | ICD-10-CM | POA: Insufficient documentation

## 2021-07-31 DIAGNOSIS — J449 Chronic obstructive pulmonary disease, unspecified: Secondary | ICD-10-CM | POA: Diagnosis not present

## 2021-07-31 DIAGNOSIS — Z7984 Long term (current) use of oral hypoglycemic drugs: Secondary | ICD-10-CM | POA: Insufficient documentation

## 2021-07-31 DIAGNOSIS — M24662 Ankylosis, left knee: Secondary | ICD-10-CM | POA: Insufficient documentation

## 2021-07-31 DIAGNOSIS — E118 Type 2 diabetes mellitus with unspecified complications: Secondary | ICD-10-CM | POA: Diagnosis not present

## 2021-07-31 DIAGNOSIS — I480 Paroxysmal atrial fibrillation: Secondary | ICD-10-CM | POA: Diagnosis not present

## 2021-07-31 DIAGNOSIS — Z9989 Dependence on other enabling machines and devices: Secondary | ICD-10-CM | POA: Insufficient documentation

## 2021-07-31 DIAGNOSIS — Z01818 Encounter for other preprocedural examination: Secondary | ICD-10-CM

## 2021-07-31 HISTORY — DX: Chronic obstructive pulmonary disease, unspecified: J44.9

## 2021-07-31 LAB — CBC
HCT: 36 % — ABNORMAL LOW (ref 39.0–52.0)
Hemoglobin: 13.2 g/dL (ref 13.0–17.0)
MCH: 37.8 pg — ABNORMAL HIGH (ref 26.0–34.0)
MCHC: 36.7 g/dL — ABNORMAL HIGH (ref 30.0–36.0)
MCV: 103.2 fL — ABNORMAL HIGH (ref 80.0–100.0)
Platelets: 200 10*3/uL (ref 150–400)
RBC: 3.49 MIL/uL — ABNORMAL LOW (ref 4.22–5.81)
RDW: 13.3 % (ref 11.5–15.5)
WBC: 4.8 10*3/uL (ref 4.0–10.5)
nRBC: 0 % (ref 0.0–0.2)

## 2021-07-31 LAB — HEMOGLOBIN A1C
Hgb A1c MFr Bld: 6.4 % — ABNORMAL HIGH (ref 4.8–5.6)
Mean Plasma Glucose: 136.98 mg/dL

## 2021-07-31 LAB — GLUCOSE, CAPILLARY: Glucose-Capillary: 114 mg/dL — ABNORMAL HIGH (ref 70–99)

## 2021-07-31 LAB — BASIC METABOLIC PANEL
Anion gap: 4 — ABNORMAL LOW (ref 5–15)
BUN: 24 mg/dL — ABNORMAL HIGH (ref 8–23)
CO2: 26 mmol/L (ref 22–32)
Calcium: 9.8 mg/dL (ref 8.9–10.3)
Chloride: 114 mmol/L — ABNORMAL HIGH (ref 98–111)
Creatinine, Ser: 1.1 mg/dL (ref 0.61–1.24)
GFR, Estimated: >60 mL/min (ref >60)
Glucose, Bld: 112 mg/dL — ABNORMAL HIGH (ref 70–99)
Potassium: 4.3 mmol/L (ref 3.5–5.1)
Sodium: 144 mmol/L (ref 135–145)

## 2021-08-07 ENCOUNTER — Other Ambulatory Visit: Payer: Self-pay | Admitting: Nurse Practitioner

## 2021-08-07 DIAGNOSIS — J449 Chronic obstructive pulmonary disease, unspecified: Secondary | ICD-10-CM

## 2021-08-12 ENCOUNTER — Ambulatory Visit (HOSPITAL_COMMUNITY): Payer: Medicare Other | Admitting: Physician Assistant

## 2021-08-12 ENCOUNTER — Other Ambulatory Visit: Payer: Self-pay

## 2021-08-12 ENCOUNTER — Ambulatory Visit (HOSPITAL_BASED_OUTPATIENT_CLINIC_OR_DEPARTMENT_OTHER): Payer: Medicare Other | Admitting: Anesthesiology

## 2021-08-12 ENCOUNTER — Encounter (HOSPITAL_COMMUNITY): Payer: Self-pay | Admitting: Orthopedic Surgery

## 2021-08-12 ENCOUNTER — Encounter (HOSPITAL_COMMUNITY)
Admission: RE | Disposition: A | Discharge status: Home / Self Care | Payer: Self-pay | Source: Ambulatory Visit | Attending: Orthopedic Surgery

## 2021-08-12 ENCOUNTER — Ambulatory Visit (HOSPITAL_COMMUNITY)
Admission: RE | Admit: 2021-08-12 | Discharge: 2021-08-12 | Disposition: A | Discharge status: Home / Self Care | Payer: Medicare Other | Source: Ambulatory Visit | Attending: Orthopedic Surgery | Admitting: Orthopedic Surgery

## 2021-08-12 DIAGNOSIS — J449 Chronic obstructive pulmonary disease, unspecified: Secondary | ICD-10-CM | POA: Insufficient documentation

## 2021-08-12 DIAGNOSIS — E119 Type 2 diabetes mellitus without complications: Secondary | ICD-10-CM | POA: Diagnosis not present

## 2021-08-12 DIAGNOSIS — M24662 Ankylosis, left knee: Secondary | ICD-10-CM

## 2021-08-12 DIAGNOSIS — T8482XA Fibrosis due to internal orthopedic prosthetic devices, implants and grafts, initial encounter: Secondary | ICD-10-CM

## 2021-08-12 DIAGNOSIS — G4733 Obstructive sleep apnea (adult) (pediatric): Secondary | ICD-10-CM | POA: Diagnosis not present

## 2021-08-12 DIAGNOSIS — Z87891 Personal history of nicotine dependence: Secondary | ICD-10-CM | POA: Insufficient documentation

## 2021-08-12 DIAGNOSIS — Z01818 Encounter for other preprocedural examination: Secondary | ICD-10-CM

## 2021-08-12 DIAGNOSIS — K219 Gastro-esophageal reflux disease without esophagitis: Secondary | ICD-10-CM | POA: Diagnosis not present

## 2021-08-12 DIAGNOSIS — Z7984 Long term (current) use of oral hypoglycemic drugs: Secondary | ICD-10-CM | POA: Diagnosis not present

## 2021-08-12 DIAGNOSIS — I4891 Unspecified atrial fibrillation: Secondary | ICD-10-CM | POA: Diagnosis not present

## 2021-08-12 HISTORY — PX: KNEE ARTHROTOMY: SHX5881

## 2021-08-12 LAB — GLUCOSE, CAPILLARY
Glucose-Capillary: 135 mg/dL — ABNORMAL HIGH (ref 70–99)
Glucose-Capillary: 160 mg/dL — ABNORMAL HIGH (ref 70–99)

## 2021-08-12 SURGERY — ARTHROTOMY, KNEE
Anesthesia: General | Site: Knee | Laterality: Left

## 2021-08-12 MED ORDER — LACTATED RINGERS IV SOLN: INTRAVENOUS | Status: DC

## 2021-08-12 MED ORDER — FENTANYL CITRATE PF 50 MCG/ML IJ SOSY
50.0000 ug | PREFILLED_SYRINGE | INTRAMUSCULAR | Status: DC
  Administered 2021-08-12: 50 ug via INTRAVENOUS
  Filled 2021-08-12: qty 2

## 2021-08-12 MED ORDER — LIDOCAINE 2% (20 MG/ML) 5 ML SYRINGE
INTRAMUSCULAR | Status: DC | PRN
  Administered 2021-08-12: 100 mg via INTRAVENOUS

## 2021-08-12 MED ORDER — BUPIVACAINE-EPINEPHRINE (PF) 0.5% -1:200000 IJ SOLN
INTRAMUSCULAR | Status: DC | PRN
  Administered 2021-08-12: 15 mL via PERINEURAL

## 2021-08-12 MED ORDER — OXYCODONE HCL 5 MG/5ML PO SOLN
ORAL | Status: AC
  Filled 2021-08-12: qty 5

## 2021-08-12 MED ORDER — ORAL CARE MOUTH RINSE
15.0000 mL | Freq: Once | OROMUCOSAL | Status: AC
Start: 2021-08-12 — End: 2021-08-12

## 2021-08-12 MED ORDER — MIDAZOLAM HCL 2 MG/2ML IJ SOLN
1.0000 mg | INTRAMUSCULAR | Status: DC
  Filled 2021-08-12: qty 2

## 2021-08-12 MED ORDER — HYDROMORPHONE HCL 1 MG/ML IJ SOLN
INTRAMUSCULAR | Status: AC
  Filled 2021-08-12: qty 1

## 2021-08-12 MED ORDER — OXYCODONE HCL 5 MG PO TABS: 5.0000 mg | ORAL_TABLET | Freq: Once | ORAL | Status: AC | PRN

## 2021-08-12 MED ORDER — SODIUM CHLORIDE (PF) 0.9 % IJ SOLN
INTRAMUSCULAR | Status: AC
  Filled 2021-08-12: qty 30

## 2021-08-12 MED ORDER — METHOCARBAMOL 500 MG IVPB - SIMPLE MED
INTRAVENOUS | Status: AC
  Filled 2021-08-12: qty 55

## 2021-08-12 MED ORDER — HYDROMORPHONE HCL 1 MG/ML IJ SOLN
0.2500 mg | INTRAMUSCULAR | Status: DC | PRN
  Administered 2021-08-12 (×3): 0.5 mg via INTRAVENOUS

## 2021-08-12 MED ORDER — CHLORHEXIDINE GLUCONATE 0.12 % MT SOLN
15.0000 mL | Freq: Once | OROMUCOSAL | Status: AC
Start: 2021-08-12 — End: 2021-08-12
  Administered 2021-08-12: 15 mL via OROMUCOSAL

## 2021-08-12 MED ORDER — BUPIVACAINE-EPINEPHRINE (PF) 0.5% -1:200000 IJ SOLN
INTRAMUSCULAR | Status: AC
  Filled 2021-08-12: qty 30

## 2021-08-12 MED ORDER — FENTANYL CITRATE PF 50 MCG/ML IJ SOSY
PREFILLED_SYRINGE | INTRAMUSCULAR | Status: AC
  Filled 2021-08-12: qty 1

## 2021-08-12 MED ORDER — ACETAMINOPHEN 10 MG/ML IV SOLN
1000.0000 mg | Freq: Four times a day (QID) | INTRAVENOUS | Status: DC
  Administered 2021-08-12: 1000 mg via INTRAVENOUS
  Filled 2021-08-12: qty 100

## 2021-08-12 MED ORDER — DEXAMETHASONE SODIUM PHOSPHATE 10 MG/ML IJ SOLN
8.0000 mg | Freq: Once | INTRAMUSCULAR | Status: AC
  Administered 2021-08-12: 8 mg via INTRAVENOUS

## 2021-08-12 MED ORDER — ONDANSETRON HCL 4 MG/2ML IJ SOLN
INTRAMUSCULAR | Status: DC | PRN
  Administered 2021-08-12: 4 mg via INTRAVENOUS

## 2021-08-12 MED ORDER — PHENYLEPHRINE 80 MCG/ML (10ML) SYRINGE FOR IV PUSH (FOR BLOOD PRESSURE SUPPORT)
PREFILLED_SYRINGE | INTRAVENOUS | Status: AC
  Filled 2021-08-12: qty 10

## 2021-08-12 MED ORDER — CLONIDINE HCL (ANALGESIA) 100 MCG/ML EP SOLN
EPIDURAL | Status: DC | PRN
  Administered 2021-08-12: 100 ug

## 2021-08-12 MED ORDER — CEFAZOLIN SODIUM-DEXTROSE 2-4 GM/100ML-% IV SOLN
2.0000 g | INTRAVENOUS | Status: AC
  Administered 2021-08-12: 2 g via INTRAVENOUS
  Filled 2021-08-12: qty 100

## 2021-08-12 MED ORDER — POVIDONE-IODINE 10 % EX SWAB
2.0000 | Freq: Once | CUTANEOUS | Status: AC
  Administered 2021-08-12: 2 via TOPICAL

## 2021-08-12 MED ORDER — ACETAMINOPHEN 500 MG PO TABS: 1000.0000 mg | ORAL_TABLET | Freq: Four times a day (QID) | ORAL | Status: DC

## 2021-08-12 MED ORDER — FENTANYL CITRATE (PF) 100 MCG/2ML IJ SOLN
INTRAMUSCULAR | Status: DC | PRN
  Administered 2021-08-12 (×5): 50 ug via INTRAVENOUS

## 2021-08-12 MED ORDER — FENTANYL CITRATE PF 50 MCG/ML IJ SOSY
25.0000 ug | PREFILLED_SYRINGE | INTRAMUSCULAR | Status: DC | PRN
  Administered 2021-08-12 (×3): 50 ug via INTRAVENOUS

## 2021-08-12 MED ORDER — DEXAMETHASONE SODIUM PHOSPHATE 10 MG/ML IJ SOLN
INTRAMUSCULAR | Status: AC
  Filled 2021-08-12: qty 1

## 2021-08-12 MED ORDER — ONDANSETRON HCL 4 MG/2ML IJ SOLN
INTRAMUSCULAR | Status: AC
  Filled 2021-08-12: qty 2

## 2021-08-12 MED ORDER — FENTANYL CITRATE (PF) 250 MCG/5ML IJ SOLN
INTRAMUSCULAR | Status: AC
  Filled 2021-08-12: qty 5

## 2021-08-12 MED ORDER — NALBUPHINE HCL 10 MG/ML IJ SOLN
10.0000 mg | Freq: Once | INTRAMUSCULAR | Status: AC
  Administered 2021-08-12: 10 mg via INTRAVENOUS
  Filled 2021-08-12: qty 1

## 2021-08-12 MED ORDER — BUPIVACAINE LIPOSOME 1.3 % IJ SUSP: 20.0000 mL | Freq: Once | INTRAMUSCULAR | Status: DC

## 2021-08-12 MED ORDER — ASPIRIN 81 MG PO TBEC: 81.0000 mg | DELAYED_RELEASE_TABLET | Freq: Two times a day (BID) | ORAL | 0 refills | Status: AC

## 2021-08-12 MED ORDER — OXYCODONE HCL 5 MG/5ML PO SOLN
5.0000 mg | Freq: Once | ORAL | Status: AC | PRN
  Administered 2021-08-12: 5 mg via ORAL

## 2021-08-12 MED ORDER — 0.9 % SODIUM CHLORIDE (POUR BTL) OPTIME
TOPICAL | Status: DC | PRN
  Administered 2021-08-12: 1000 mL

## 2021-08-12 MED ORDER — ASPIRIN 81 MG PO TBEC
81.0000 mg | DELAYED_RELEASE_TABLET | Freq: Two times a day (BID) | ORAL | Status: DC
  Filled 2021-08-12: qty 1

## 2021-08-12 MED ORDER — PHENYLEPHRINE 80 MCG/ML (10ML) SYRINGE FOR IV PUSH (FOR BLOOD PRESSURE SUPPORT)
PREFILLED_SYRINGE | INTRAVENOUS | Status: DC | PRN
  Administered 2021-08-12: 160 ug via INTRAVENOUS

## 2021-08-12 MED ORDER — EPHEDRINE 5 MG/ML INJ
INTRAVENOUS | Status: AC
  Filled 2021-08-12: qty 5

## 2021-08-12 MED ORDER — LIDOCAINE HCL (PF) 2 % IJ SOLN
INTRAMUSCULAR | Status: AC
  Filled 2021-08-12: qty 5

## 2021-08-12 MED ORDER — EPHEDRINE SULFATE-NACL 50-0.9 MG/10ML-% IV SOSY
PREFILLED_SYRINGE | INTRAVENOUS | Status: DC | PRN
  Administered 2021-08-12: 10 mg via INTRAVENOUS

## 2021-08-12 MED ORDER — PROPOFOL 10 MG/ML IV BOLUS
INTRAVENOUS | Status: AC
  Filled 2021-08-12: qty 20

## 2021-08-12 MED ORDER — METHOCARBAMOL 500 MG PO TABS: 500.0000 mg | ORAL_TABLET | Freq: Four times a day (QID) | ORAL | Status: DC | PRN

## 2021-08-12 MED ORDER — METHOCARBAMOL 500 MG IVPB - SIMPLE MED
500.0000 mg | Freq: Four times a day (QID) | INTRAVENOUS | Status: DC | PRN
  Administered 2021-08-12: 500 mg via INTRAVENOUS

## 2021-08-12 MED ORDER — METHOCARBAMOL 500 MG PO TABS: 500.0000 mg | ORAL_TABLET | Freq: Four times a day (QID) | ORAL | 0 refills | Status: DC | PRN

## 2021-08-12 MED ORDER — PROPOFOL 10 MG/ML IV BOLUS
INTRAVENOUS | Status: DC | PRN
  Administered 2021-08-12: 160 mg via INTRAVENOUS

## 2021-08-12 MED ORDER — OXYCODONE HCL 5 MG PO TABS: 5.0000 mg | ORAL_TABLET | Freq: Four times a day (QID) | ORAL | 0 refills | Status: DC | PRN

## 2021-08-12 SURGICAL SUPPLY — 31 items
BAG COUNTER SPONGE SURGICOUNT (BAG) IMPLANT
BAG ZIPLOCK 12X15 (MISCELLANEOUS) ×2 IMPLANT
BNDG ELASTIC 6X5.8 VLCR STR LF (GAUZE/BANDAGES/DRESSINGS) ×2 IMPLANT
COVER SURGICAL LIGHT HANDLE (MISCELLANEOUS) ×2 IMPLANT
CUFF TOURN SGL QUICK 34 (TOURNIQUET CUFF) ×2
CUFF TRNQT CYL 34X4.125X (TOURNIQUET CUFF) IMPLANT
DRAPE INCISE IOBAN 66X45 STRL (DRAPES) ×6 IMPLANT
DRAPE U-SHAPE 47X51 STRL (DRAPES) ×2 IMPLANT
DRSG AQUACEL AG ADV 3.5X10 (GAUZE/BANDAGES/DRESSINGS) ×2 IMPLANT
DURAPREP 26ML APPLICATOR (WOUND CARE) ×2 IMPLANT
ELECT REM PT RETURN 15FT ADLT (MISCELLANEOUS) ×2 IMPLANT
EVACUATOR 1/8 PVC DRAIN (DRAIN) IMPLANT
GLOVE BIO SURGEON STRL SZ 6.5 (GLOVE) ×4 IMPLANT
GLOVE BIO SURGEON STRL SZ7 (GLOVE) ×2 IMPLANT
GLOVE BIO SURGEON STRL SZ8 (GLOVE) ×4 IMPLANT
GLOVE BIOGEL PI IND STRL 7.0 (GLOVE) ×2 IMPLANT
GLOVE BIOGEL PI IND STRL 8 (GLOVE) ×3 IMPLANT
GLOVE BIOGEL PI INDICATOR 7.0 (GLOVE) ×2
GLOVE BIOGEL PI INDICATOR 8 (GLOVE) ×3
GOWN STRL REUS W/ TWL LRG LVL3 (GOWN DISPOSABLE) ×3 IMPLANT
GOWN STRL REUS W/TWL LRG LVL3 (GOWN DISPOSABLE) ×6
KIT TURNOVER KIT A (KITS) ×1 IMPLANT
MANIFOLD NEPTUNE II (INSTRUMENTS) ×2 IMPLANT
PACK TOTAL KNEE CUSTOM (KITS) ×2 IMPLANT
PROTECTOR NERVE ULNAR (MISCELLANEOUS) ×2 IMPLANT
STRIP CLOSURE SKIN 1/2X4 (GAUZE/BANDAGES/DRESSINGS) ×4 IMPLANT
SUT MNCRL AB 4-0 PS2 18 (SUTURE) ×3 IMPLANT
SUT STRATAFIX 0 PDS 27 VIOLET (SUTURE) ×2
SUT VIC AB 2-0 CT1 27 (SUTURE) ×4
SUT VIC AB 2-0 CT1 TAPERPNT 27 (SUTURE) ×2 IMPLANT
SUTURE STRATFX 0 PDS 27 VIOLET (SUTURE) ×1 IMPLANT

## 2021-08-12 NOTE — Interval H&P Note (Signed)
History and Physical Interval Note:  08/12/2021 2:27 PM  [name]  has presented today for surgery, with the diagnosis of Left knee arthrofibrosis.  The various methods of treatment have been discussed with the patient and family. After consideration of risks, benefits and other options for treatment, the patient has consented to  Procedure(s): Left knee arthrotomy; scar excision (Left) as a surgical intervention.  The patient's history has been reviewed, patient examined, no change in status, stable for surgery.  I have reviewed the patient's chart and labs.  Questions were answered to the patient's satisfaction.     Homero Fellers Lucky Alverson

## 2021-08-12 NOTE — Progress Notes (Signed)
Assisted Dr. Johnn Hai with left adductor canal, ultrasound guided block. Side rails up, monitors on throughout procedure. See vital signs in flow sheet. Tolerated Procedure well.

## 2021-08-12 NOTE — Anesthesia Postprocedure Evaluation (Signed)
Anesthesia Post Note  Patient: BRITTON BERA  Procedure(s) Performed: Left knee arthrotomy; scar excision (Left: Knee)     Patient location during evaluation: PACU Anesthesia Type: General Level of consciousness: awake and alert Pain management: pain level controlled Vital Signs Assessment: post-procedure vital signs reviewed and stable Respiratory status: spontaneous breathing, nonlabored ventilation and respiratory function stable Cardiovascular status: blood pressure returned to baseline Postop Assessment: no apparent nausea or vomiting Anesthetic complications: no   No notable events documented.  Last Vitals:  Vitals:   08/12/21 1800 08/12/21 1815  BP: 137/71 (!) 159/70  Pulse: 67 78  Resp: 16 16  Temp:    SpO2: 96% 94%    Last Pain:  Vitals:   08/12/21 1815  TempSrc:   PainSc: 6                  Shanda Howells

## 2021-08-12 NOTE — Brief Op Note (Signed)
08/12/2021  4:31 PM  PATIENT:  [name]  81 y.o. male  PRE-OPERATIVE DIAGNOSIS:  Left knee arthrofibrosis  POST-OPERATIVE DIAGNOSIS:  Left knee arthrofibrosis  PROCEDURE:  Procedure(s): Left knee arthrotomy; scar excision (Left)  SURGEON:  Surgeon(s) and Role:    Ollen Gross, MD - Primary  PHYSICIAN ASSISTANT:   ASSISTANTS: Arther Abbott, PA-C   ANESTHESIA:   regional and general  EBL:  50 mL   BLOOD ADMINISTERED:none  DRAINS: none   LOCAL MEDICATIONS USED:  NONE  COUNTS:  YES  TOURNIQUET:   Total Tourniquet Time Documented: Thigh (Left) - 24 minutes Total: Thigh (Left) - 24 minutes   DICTATION: .#[id number]  PLAN OF CARE: Discharge to home after PACU  PATIENT DISPOSITION:  PACU - hemodynamically stable.

## 2021-08-12 NOTE — Progress Notes (Signed)
Orthopedic Tech Progress Note Patient Details:  KOLDEN DUPEE 08/17/1940 [id number]  Ortho Devices Type of Ortho Device: Knee Immobilizer Ortho Device/Splint Location: LLE Ortho Device/Splint Interventions: Application   Post Interventions Patient Tolerated: Well Instructions Provided: Adjustment of device  Owens Hara E Yanette Tripoli 08/12/2021, 7:18 PM

## 2021-08-12 NOTE — Op Note (Unsigned)
[name], [facility]JORT MEDICAL RECORD NO: [id number] ACCOUNT NO: [id number] DATE OF BIRTH: 1940/06/04 FACILITY: [facility] LOCATION: [facility] PHYSICIAN: Gus Rankin. Leather Estis, MD  Operative Report   DATE OF PROCEDURE: 08/12/2021  PREOPERATIVE DIAGNOSIS:  Arthrofibrosis, left knee.  POSTOPERATIVE DIAGNOSIS:  Arthrofibrosis, left knee.  PROCEDURE:  Left knee arthrotomy with scar excision.  SURGEON:  Gus Rankin. Malley Hauter, MD  ASSISTANT:  Arther Abbott, PA-C.  ANESTHESIA:  General and adductor canal block.  ESTIMATED BLOOD LOSS:  50 mL.  DRAINS:  None.  TOURNIQUET TIME:  24 minutes at 300 mmHg.  COMPLICATIONS:  None.  CONDITION:  Stable to recovery.  BRIEF CLINICAL NOTE:  The patient is an 81 year old male with a very complex history in regards to his left knee.  He has had multiple procedures on that knee.  He has significant arthrofibrosis and range of motion is only 25-70 degrees.  He has had  extensive therapy in the past.  He presents now for an arthrotomy and scar excision in an attempt to gain more range of motion.  DESCRIPTION OF PROCEDURE:  After successful administration of adductor canal block, then general anesthetic, a tourniquet was placed on his left thigh and exam under anesthesia shows range of motion from 25-70 degrees.  Left lower extremity was then  prepped and draped in the usual sterile fashion.  Extremity was wrapped in Esmarch, tourniquet inflated to 300 mmHg.  Midline incision was made with a 10 blade through the subcutaneous tissue.  Subcutaneous flaps were created to disrupt any scar tissue  in the subcutaneous space.  We then used a fresh blade to make a medial parapatellar arthrotomy.  The tissues were extremely scarred and thickened.  I elevated soft tissue over the proximal medial tibia to the joint line with a knife and into the  semimembranosus bursa with a Cobb elevator.  His extensor mechanism was scarred down through the femur.  We were able to recreate the  medial gutter and excise the scar tissue underneath the medial aspect of the extensor mechanism.  The lateral side was  even tighter.  I was able to remove the scarred fat pad and there was a lot of spurring even overlying the patella.  All of that is removed to expose the patellar component, which was well fixed and there was no evidence of any wear.  We excised more  scar tissue up in the suprapatellar area and recreated the lateral gutter.  Once I excised all the scar and flexed the knee against gravity and went to 105 degrees actively and I can passively flex to about 110.  He also got to within 10 degrees of full  extension.  We then thoroughly irrigated the knee with saline solution.  The arthrotomy was closed with a running 0 Stratafix suture.  Flexion against gravity again is 105 degrees.  We were also able to get it within 10 degrees of full extension.   Tourniquet was released, total time of 24 minutes.  Subcutaneous was closed with interrupted 2-0 Vicryl and skin closed with staples.  Incision was cleaned and dried and sterile dressing applied.  He was awakened and transported to recovery in stable  condition.   PUS D: 08/12/2021 4:35:34 pm T: 08/12/2021 7:48:00 pm  JOB: [id number]/ [id number]

## 2021-08-12 NOTE — Anesthesia Procedure Notes (Signed)
Procedure Name: LMA Insertion Date/Time: 08/12/2021 3:23 PM  Performed by: Jhonnie Garner, CRNAPre-anesthesia Checklist: Patient identified, Emergency Drugs available, Suction available and Patient being monitored Patient Re-evaluated:Patient Re-evaluated prior to induction Oxygen Delivery Method: Circle system utilized Preoxygenation: Pre-oxygenation with 100% oxygen Induction Type: IV induction LMA: LMA inserted LMA Size: 5.0 Number of attempts: 1 Placement Confirmation: positive ETCO2 and breath sounds checked- equal and bilateral Tube secured with: Tape Dental Injury: Teeth and Oropharynx as per pre-operative assessment

## 2021-08-12 NOTE — Anesthesia Procedure Notes (Signed)
Anesthesia Regional Block: Adductor canal block   Pre-Anesthetic Checklist: , timeout performed,  Correct Patient, Correct Site, Correct Laterality,  Correct Procedure, Correct Position, site marked,  Risks and benefits discussed,  Pre-op evaluation,  At surgeon's request and post-op pain management  Laterality: Left  Prep: Maximum Sterile Barrier Precautions used, chloraprep       Needles:  Injection technique: Single-shot  Needle Type: Echogenic Stimulator Needle     Needle Length: 9cm  Needle Gauge: 22     Additional Needles:   Procedures:,,,, ultrasound used (permanent image in chart),,    Narrative:  Start time: 08/12/2021 3:02 PM End time: 08/12/2021 3:05 PM Injection made incrementally with aspirations every 5 mL.  Performed by: Personally  Anesthesiologist: Kaylyn Layer, MD  Additional Notes: Risks, benefits, and alternative discussed. Patient gave consent for procedure. Patient prepped and draped in sterile fashion. Sedation administered, patient remains easily responsive to voice. Relevant anatomy identified with ultrasound guidance. Local anesthetic given in 5cc increments with no signs or symptoms of intravascular injection. No pain or paraesthesias with injection. Patient monitored throughout procedure with signs of LAST or immediate complications. Tolerated well. Ultrasound image placed in chart.  Amalia Greenhouse, MD

## 2021-08-12 NOTE — Discharge Instructions (Addendum)
[name] Arabian, MD Total Joint Specialist [facility] [place] [address], [address] [place], [place] [address] [phone]  POSTOPERATIVE DIRECTIONS  Knee Rehabilitation, Guidelines Following Surgery  Results after knee surgery are often greatly improved when you follow the exercise, range of motion and muscle strengthening exercises prescribed by your doctor. Safety measures are also important to protect the knee from further injury. If any of these exercises cause you to have increased pain or swelling in your knee joint, decrease the amount until you are comfortable again and slowly increase them. If you have problems or questions, call your caregiver or physical therapist for advice.   HOME CARE INSTRUCTIONS  Remove items at home which could result in a fall. This includes throw rugs or furniture in walking pathways.  ICE to the affected knee as much as tolerated. Icing helps control swelling. If the swelling is well controlled you will be more comfortable and rehab easier. Continue to use ice on the knee for pain and swelling from surgery. You may notice swelling that will progress down to the foot and ankle. This is normal after surgery. Elevate the leg when you are not up walking on it.    Continue to use the breathing machine which will help keep your temperature down. It is common for your temperature to cycle up and down following surgery, especially at night when you are not up moving around and exerting yourself. The breathing machine keeps your lungs expanded and your temperature down. Do not place pillow under the operative knee, focus on keeping the knee straight while resting  DIET You may resume your previous home diet once you are discharged from the hospital.  DRESSING / WOUND CARE / SHOWERING Keep your bulky bandage on for 2 days. On the third post-operative day you may remove the Ace bandage and gauze. There is a waterproof adhesive bandage on your skin  which will stay in place until your first follow-up appointment. Once you remove this you will not need to place another bandage You may begin showering 3 days following surgery, but do not submerge the incision under water.  ACTIVITY For the first 5 days, the key is rest and control of pain and swelling Do your home exercises twice a day starting on post-operative day 3. On the days you go to physical therapy, just do the home exercises once that day. You should rest, ice and elevate the leg for 50 minutes out of every hour. Get up and walk/stretch for 10 minutes per hour. After 5 days you can increase your activity slowly as tolerated. Walk with your walker as instructed. Use the walker until you are comfortable transitioning to a cane. Walk with the cane in the opposite hand of the operative leg. You may discontinue the cane once you are comfortable and walking steadily. Avoid periods of inactivity such as sitting longer than an hour when not asleep. This helps prevent blood clots.  You may discontinue the knee immobilizer once you are able to perform a straight leg raise while lying down. You may resume a sexual relationship in one month or when given the OK by your doctor.  You may return to work once you are cleared by your doctor.  Do not drive a car for 6 weeks or until released by your surgeon.  Do not drive while taking narcotics.  TED HOSE STOCKINGS Wear the elastic stockings on both legs for three weeks following surgery during the day. You may remove them  at night for sleeping.  WEIGHT BEARING Weight bearing as tolerated with assist device (walker, cane, etc) as directed, use it as long as suggested by your surgeon or therapist, typically at least 4-6 weeks.  POSTOPERATIVE CONSTIPATION PROTOCOL Constipation - defined medically as fewer than three stools per week and severe constipation as less than one stool per week.  One of the most common issues patients have following surgery  is constipation.  Even if you have a regular bowel pattern at home, your normal regimen is likely to be disrupted due to multiple reasons following surgery.  Combination of anesthesia, postoperative narcotics, change in appetite and fluid intake all can affect your bowels.  In order to avoid complications following surgery, here are some recommendations in order to help you during your recovery period.  Colace (docusate) - Pick up an over-the-counter form of Colace or another stool softener and take twice a day as long as you are requiring postoperative pain medications.  Take with a full glass of water daily.  If you experience loose stools or diarrhea, hold the colace until you stool forms back up. If your symptoms do not get better within 1 week or if they get worse, check with your doctor. Dulcolax (bisacodyl) - Pick up over-the-counter and take as directed by the product packaging as needed to assist with the movement of your bowels.  Take with a full glass of water.  Use this product as needed if not relieved by Colace only.  MiraLax (polyethylene glycol) - Pick up over-the-counter to have on hand. MiraLax is a solution that will increase the amount of water in your bowels to assist with bowel movements.  Take as directed and can mix with a glass of water, juice, soda, coffee, or tea. Take if you go more than two days without a movement. Do not use MiraLax more than once per day. Call your doctor if you are still constipated or irregular after using this medication for 7 days in a row.  If you continue to have problems with postoperative constipation, please contact the office for further assistance and recommendations.  If you experience "the worst abdominal pain ever" or develop nausea or vomiting, please contact the office immediatly for further recommendations for treatment.  ITCHING If you experience itching with your medications, try taking only a single pain pill, or even half a pain pill at a  time.  You can also use Benadryl over the counter for itching or also to help with sleep.   MEDICATIONS See your medication summary on the "After Visit Summary" that the nursing staff will review with you prior to discharge.  You may have some home medications which will be placed on hold until you complete the course of blood thinner medication.  It is important for you to complete the blood thinner medication as prescribed by your surgeon.  Continue your approved medications as instructed at time of discharge.  PRECAUTIONS If you experience chest pain or shortness of breath - call 911 immediately for transfer to the hospital emergency department.  If you develop a fever greater that 101 F, purulent drainage from wound, increased redness or drainage from wound, foul odor from the wound/dressing, or calf pain - CONTACT YOUR SURGEON.                                                     FOLLOW-UP APPOINTMENTS Make sure you keep all of your appointments after your operation with your surgeon and caregivers. You should call the office at the above phone number and make an appointment for approximately two weeks after the date of your surgery or on the date instructed by your surgeon outlined in the "After Visit Summary".  RANGE OF MOTION AND STRENGTHENING EXERCISES  Rehabilitation of the knee is important following a knee injury or an operation. After just a few days of immobilization, the muscles of the thigh which control the knee become weakened and shrink (atrophy). Knee exercises are designed to build up the tone and strength of the thigh muscles and to improve knee motion. Often times heat used for twenty to thirty minutes before working out will loosen up your tissues and help with improving the range of motion but do not use heat for the first two weeks following surgery. These exercises can be done on a training (exercise) mat, on the floor, on a table or on a bed. Use what ever works the best and is  most comfortable for you Knee exercises include:  Leg Lifts - While your knee is still immobilized in a splint or cast, you can do straight leg raises. Lift the leg to 60 degrees, hold for 3 sec, and slowly lower the leg. Repeat 10-20 times 2-3 times daily. Perform this exercise against resistance later as your knee gets better.  Quad and Hamstring Sets - Tighten up the muscle on the front of the thigh (Quad) and hold for 5-10 sec. Repeat this 10-20 times hourly. Hamstring sets are done by pushing the foot backward against an object and holding for 5-10 sec. Repeat as with quad sets.  Leg Slides: Lying on your back, slowly slide your foot toward your buttocks, bending your knee up off the floor (only go as far as is comfortable). Then slowly slide your foot back down until your leg is flat on the floor again. Angel Wings: Lying on your back spread your legs to the side as far apart as you can without causing discomfort.  A rehabilitation program following serious knee injuries can speed recovery and prevent re-injury in the future due to weakened muscles. Contact your doctor or a physical therapist for more information on knee rehabilitation.   POST-OPERATIVE OPIOID TAPER INSTRUCTIONS: It is important to wean off of your opioid medication as soon as possible. If you do not need pain medication after your surgery it is ok to stop day one. Opioids include: Codeine, Hydrocodone(Norco, Vicodin), Oxycodone(Percocet, oxycontin) and hydromorphone amongst others.  Long term and even short term use of opiods can cause: Increased pain response Dependence Constipation Depression Respiratory depression And more.  Withdrawal symptoms can include Flu like symptoms Nausea, vomiting And more Techniques to manage these symptoms Hydrate well Eat regular healthy meals Stay active Use relaxation techniques(deep breathing, meditating, yoga) Do Not substitute Alcohol to help with tapering If you have been on  opioids for less than two weeks and do not have pain than it is ok to stop all together.  Plan to wean off of opioids This plan should start within one week post op of your joint replacement. Maintain the same interval or time between taking each dose and first decrease the dose.  Cut the total daily intake of opioids by one tablet each day Next start to increase the time between doses. The last dose that should be eliminated is the evening dose.   IF YOU ARE TRANSFERRED TO   A SKILLED REHAB FACILITY If the patient is transferred to a skilled rehab facility following release from the hospital, a list of the current medications will be sent to the facility for the patient to continue.  When discharged from the skilled rehab facility, please have the facility set up the patient's Home Health Physical Therapy prior to being released. Also, the skilled facility will be responsible for providing the patient with their medications at time of release from the facility to include their pain medication, the muscle relaxants, and their blood thinner medication. If the patient is still at the rehab facility at time of the two week follow up appointment, the skilled rehab facility will also need to assist the patient in arranging follow up appointment in our office and any transportation needs.  MAKE SURE YOU:  Understand these instructions.  Get help right away if you are not doing well or get worse.   DENTAL ANTIBIOTICS:  In most cases prophylactic antibiotics for Dental procdeures after total joint surgery are not necessary.  Exceptions are as follows:  1. History of prior total joint infection  2. Severely immunocompromised (Organ Transplant, cancer chemotherapy, Rheumatoid biologic medications such as Humera)  3. Poorly controlled diabetes (A1C &gt; 8.0, blood glucose over 200)  If you have one of these conditions, contact your surgeon for an antibiotic prescription, prior to your dental procedure.     Pick up stool softner and laxative for home use following surgery while on pain medications. Do not submerge incision under water. Please use good hand washing techniques while changing dressing each day. May shower starting three days after surgery. Please use a clean towel to pat the incision dry following showers. Continue to use ice for pain and swelling after surgery. Do not use any lotions or creams on the incision until instructed by your surgeon.

## 2021-08-12 NOTE — Transfer of Care (Signed)
Immediate Anesthesia Transfer of Care Note  Patient: [name]  Procedure(s) Performed: Left knee arthrotomy; scar excision (Left: Knee)  Patient Location: PACU  Anesthesia Type:General  Level of Consciousness: sedated  Airway & Oxygen Therapy: Patient Spontanous Breathing and Patient connected to face mask oxygen  Post-op Assessment: Report given to RN and Post -op Vital signs reviewed and stable  Post vital signs: Reviewed and stable  Last Vitals:  Vitals Value Taken Time  BP    Temp    Pulse 79 08/12/21 1641  Resp 13 08/12/21 1641  SpO2 100 % 08/12/21 1641  Vitals shown include unvalidated device data.  Last Pain:  Vitals:   08/12/21 1503  TempSrc:   PainSc: 0-No pain         Complications: No notable events documented.

## 2021-08-12 NOTE — Anesthesia Preprocedure Evaluation (Addendum)
Anesthesia Evaluation  Patient identified by MRN, date of birth, ID band Patient awake    Reviewed: Allergy & Precautions, NPO status , Patient's Chart, lab work & pertinent test results  History of Anesthesia Complications Negative for: history of anesthetic complications  Airway Mallampati: II  TM Distance: >3 FB Neck ROM: Full    Dental  (+) Missing,    Pulmonary asthma , sleep apnea and Continuous Positive Airway Pressure Ventilation , COPD, former smoker,    Pulmonary exam normal        Cardiovascular Normal cardiovascular exam+ dysrhythmias Atrial Fibrillation      Neuro/Psych    GI/Hepatic PUD, GERD  Controlled,  Endo/Other  diabetes, Type 2, Oral Hypoglycemic Agents  Renal/GU      Musculoskeletal  (+) Arthritis ,   Abdominal   Peds  Hematology   Anesthesia Other Findings   Reproductive/Obstetrics                           Anesthesia Physical Anesthesia Plan  ASA: 2  Anesthesia Plan: General   Post-op Pain Management: Ofirmev IV (intra-op)*   Induction: Intravenous  PONV Risk Score and Plan: 2 and Treatment may vary due to age or medical condition, Dexamethasone and Ondansetron  Airway Management Planned: LMA  Additional Equipment: None  Intra-op Plan:   Post-operative Plan: Extubation in OR  Informed Consent: I have reviewed the patients History and Physical, chart, labs and discussed the procedure including the risks, benefits and alternatives for the proposed anesthesia with the patient or authorized representative who has indicated his/her understanding and acceptance.     Dental advisory given  Plan Discussed with: CRNA  Anesthesia Plan Comments:        Anesthesia Quick Evaluation

## 2021-08-13 ENCOUNTER — Encounter (HOSPITAL_COMMUNITY): Payer: Self-pay | Admitting: Orthopedic Surgery

## 2021-10-07 ENCOUNTER — Encounter (HOSPITAL_BASED_OUTPATIENT_CLINIC_OR_DEPARTMENT_OTHER): Payer: Self-pay | Admitting: Cardiovascular Disease

## 2021-10-07 ENCOUNTER — Ambulatory Visit (INDEPENDENT_AMBULATORY_CARE_PROVIDER_SITE_OTHER): Payer: Medicare Other | Admitting: Cardiovascular Disease

## 2021-10-07 VITALS — BP 126/68 | HR 56 | Ht 67.0 in | Wt 211.3 lb

## 2021-10-07 DIAGNOSIS — G4733 Obstructive sleep apnea (adult) (pediatric): Secondary | ICD-10-CM | POA: Diagnosis not present

## 2021-10-07 DIAGNOSIS — E7800 Pure hypercholesterolemia, unspecified: Secondary | ICD-10-CM | POA: Diagnosis not present

## 2021-10-07 DIAGNOSIS — E781 Pure hyperglyceridemia: Secondary | ICD-10-CM

## 2021-10-07 DIAGNOSIS — Z9989 Dependence on other enabling machines and devices: Secondary | ICD-10-CM

## 2021-10-07 DIAGNOSIS — I480 Paroxysmal atrial fibrillation: Secondary | ICD-10-CM | POA: Diagnosis not present

## 2021-10-07 NOTE — Patient Instructions (Signed)
Medication Instructions:  Your physician recommends that you continue on your current medications as directed. Please refer to the Current Medication list given to you today.  *If you need a refill on your cardiac medications before your next appointment, please call your pharmacy*  Lab Work: NONE  Testing/Procedures: NONE  Follow-Up: At [facility], you and your health needs are our priority.  As part of our continuing mission to provide you with exceptional heart care, we have created designated Provider Care Teams.  These Care Teams include your primary Cardiologist (physician) and Advanced Practice Providers (APPs -  Physician Assistants and Nurse Practitioners) who all work together to provide you with the care you need, when you need it.  We recommend signing up for the patient portal called "MyChart".  Sign up information is provided on this After Visit Summary.  MyChart is used to connect with patients for Virtual Visits (Telemedicine).  Patients are able to view lab/test results, encounter notes, upcoming appointments, etc.  Non-urgent messages can be sent to your provider as well.   To learn more about what you can do with MyChart, go to [email or web address].    Your next appointment:   12 month(s)  The format for your next appointment:   In Person  Provider:   Tiffany [place], MD     

## 2021-10-07 NOTE — Progress Notes (Signed)
Cardiology Office Note   Date:  10/07/2021   ID:  [name], [name] 04, 1942, MRN [id number]  PCP:  Janie Morning, DO  Cardiologist:   [name], MD   No chief complaint on file.    History of Present Illness: [name] is a 81 y.o. male with paroxysmal atrial fibrillation, diabetes, hyperlipidemia, and OSA on CPAP, who presents for follow-up. [name] was initially seen 06/21/16 when he developed postoperative atrial fibrillation. He underwent closed manipulation of the left knee and was noted to be in atrial fibrillation in the PACU.  This was his first known episode of atrial fibrillation.  Prior to the episode he noted 2 weeks of exertional dyspnea. He was relatively asymptomatic and was started on metoprolol and Eliquis.  He followed up in atrial fibrillation clinic and was feeling well. TSH was mildly elevated but T4 was within the normal range.  He was subsequetly started on low dose levothyroxine.  He had an echo 07/02/16 that revealed LVEF 60-65% with grade 1 diastolic dysfunction.  He wore a 30-day event monitor 04/2017 that revealed no atrial fibrillation.  Given that his atrial fibrillation was perioperative Eliquis was discontinued and he was started on aspirin.   [name] reported exertional dyspnea. He was referred for a Lexiscan Myoview 04/2019 that revealed LVEF 60% and no ischemia.  Amiodarone was discontinued. At the last visit, he was doing well but continued to complain of exertional dyspnea. He was referred to pulmonary and was started on symbicort with improvement on his breathing. They found a pulmonary nodule and recommended surveillance.   Today, he states he is doing well. He becomes winded on occasion but is not having the same shortness of breath he was before. He has been going to the gym for most of his exercise. His right knee pain has kept him from golfing lately, but he remains as active as he can be. He has been working on eating healthier  and dieting with less snacks between meals. Recently, he lost 12 lbs and continues to work on further weight loss. He denies any palpitations, chest pain, or peripheral edema. No lightheadedness, headaches, syncope, orthopnea, or PND.   Past Medical History:  Diagnosis Date   Arthritis    "hands; probably in my back too" (08/28/2015); osteo related   Barrett's esophagus with dysplasia    Chronic lower back pain    DDD   Closed patellar sleeve fracture of left knee    Constipation    COPD (chronic obstructive pulmonary disease) (HCC)    Crush injury knee    bilateral   Dysphagia    EGD with dilitation   Dysrhythmia    thought pt. had a fib after surgery saw cardiology later and ok meds preventive per pt.   Enlarged prostate    sees a urologist   Exertional dyspnea 03/21/2019   Hard of hearing    Hyperlipidemia    takes Lipitor and Fish Oil daily   Insomnia    d/t pain   Joint pain    Joint swelling    Left tibial fracture    Multiple duodenal ulcers    Nocturia    OSA on CPAP    cpap   Pure hypercholesterolemia 05/22/2019   Type II diabetes mellitus ([place])    type 2   Unsteady gait    Urinary frequency    Wears glasses     Past Surgical History:  Procedure Laterality Date   ANTERIOR  CERVICAL DECOMP/DISCECTOMY FUSION  10/2010   ANTERIOR LAT LUMBAR FUSION  12/02/2011   Procedure: ANTERIOR LATERAL LUMBAR FUSION 1 LEVEL;  Surgeon: Eustace Moore, MD;  Location: [facility];  Service: Neurosurgery;  Laterality: Left;  left lumbar three-four   BACK SURGERY     CATARACT EXTRACTION, BILATERAL Bilateral    about a month apart.    COLONOSCOPY  04/2007   ESOPHAGOGASTRODUODENOSCOPY     ESOPHAGOGASTRODUODENOSCOPY  04/2007; 07/2008   ESOPHAGOGASTRODUODENOSCOPY (EGD) WITH ESOPHAGEAL DILATION  "2-3 times"   EXCISIONAL HEMORRHOIDECTOMY  ~ [place] Left 12/08/2016   Procedure: LEFT KNEE FEMORAL REVISION;  Surgeon: Gaynelle Arabian, MD;  Location: [facility];  Service:  Orthopedics;  Laterality: Left;  Adductor Block   FINGER SURGERY Right    pinky finger   JOINT REPLACEMENT     KNEE ARTHROTOMY Left 07/24/2018   Procedure: Left knee arthrotomy with scar excision;  Surgeon: Gaynelle Arabian, MD;  Location: [facility];  Service: Orthopedics;  Laterality: Left;   KNEE ARTHROTOMY Left 05/02/2019   Procedure: Left knee arthrotomy; scar excision;  Surgeon: Gaynelle Arabian, MD;  Location: [facility];  Service: Orthopedics;  Laterality: Left;  67mn   KNEE ARTHROTOMY Left 08/12/2021   Procedure: Left knee arthrotomy; scar excision;  Surgeon: AGaynelle Arabian MD;  Location: [facility];  Service: Orthopedics;  Laterality: Left;   KNEE CLOSED REDUCTION Left 06/21/2016   Procedure: CLOSED MANIPULATION LEFT KNEE;  Surgeon: AGaynelle Arabian MD;  Location: [facility];  Service: Orthopedics;  Laterality: Left;   KNEE CLOSED REDUCTION Left 02/28/2017   Procedure: CLOSED MANIPULATION KNEE;  Surgeon: AGaynelle Arabian MD;  Location: [facility];  Service: Orthopedics;  Laterality: Left;   KNEE JOINT MANIPULATION  06/2009   LAPAROSCOPIC CHOLECYSTECTOMY  02/2000   LUMBAR FUSION  03/16/2012   Dr JRonnald Ramp  ORIF TIBIA PLATEAU Left 09/24/2015   Procedure: OPEN REDUCTION INTERNAL FIXATION (ORIF) LEFT TIBIAL PLATEAU;  Surgeon: FGaynelle Arabian MD;  Location: [facility];  Service: Orthopedics;  Laterality: Left;   PARTIAL KNEE ARTHROPLASTY Left 07/30/2015   Procedure: UNICOMPARTMENTAL LEFT KNEE MEDIAL;  Surgeon: FGaynelle Arabian MD;  Location: [facility];  Service: Orthopedics;  Laterality: Left;   POSTERIOR LUMBAR FUSION  04/2015   rods inserted L2   REFRACTIVE SURGERY Right 1980s   SHOULDER OPEN ROTATOR CUFF REPAIR Bilateral 1990s   TENNIS ELBOW RELEASE/NIRSCHEL PROCEDURE Right 1980s   TOTAL KNEE ARTHROPLASTY Right 03/2009   TOTAL KNEE ARTHROPLASTY WITH REVISION COMPONENTS Left 03/03/2016   Procedure: REVISION LEFT KNEE UNICOMPARTMENTAL ARTHROPLASTY TO TOTAL KNEE ARTHROPLASTY;  Surgeon: FGaynelle Arabian MD;  Location: [facility];   Service: Orthopedics;  Laterality: Left;  requests 2hrs; Adductor Block     Current Outpatient Medications  Medication Sig Dispense Refill   albuterol (VENTOLIN HFA) 108 (90 Base) MCG/ACT inhaler Inhale 2 puffs into the lungs every 6 (six) hours as needed for wheezing or shortness of breath. 8 g 2   atorvastatin (LIPITOR) 80 MG tablet Take 40-80 mg by mouth See admin instructions. Takes 80 mg by mouth daily on Monday, Wednesday, Fridays and take 40 mg by mouth on Tue, Thurs, Sat, and Sun     docusate sodium (COLACE) 100 MG capsule Take 200 mg by mouth at bedtime.     fenofibrate (TRICOR) 145 MG tablet Take 1 tablet by mouth once daily 90 tablet 2   glipiZIDE (GLUCOTROL) 5 MG tablet Take 5 mg by mouth every morning.     Glucosamine-Chondroit-Vit C-Mn (GLUCOSAMINE 1500 COMPLEX  PO) Take 1 capsule by mouth daily.     levothyroxine (SYNTHROID) 75 MCG tablet Take 75-150 mcg by mouth See admin instructions. Take 172mg by mouth daily on Monday, Wednesday and Friday and take 75 mcg by mouth daily on Tue, Thurs, Sat, and Sun     methocarbamol (ROBAXIN) 500 MG tablet Take 1 tablet (500 mg total) by mouth every 6 (six) hours as needed for muscle spasms. 40 tablet 0   Multiple Vitamins-Minerals (MENS MULTIVITAMIN) TABS Take 1 tablet by mouth daily.     NON FORMULARY Pt uses a cpap nightly     omeprazole (PRILOSEC) 20 MG capsule Take 20 mg by mouth daily.     oxyCODONE (OXY IR/ROXICODONE) 5 MG immediate release tablet Take 1-2 tablets (5-10 mg total) by mouth every 6 (six) hours as needed. 30 tablet 0   pioglitazone-metformin (ACTOPLUS MET) 15-500 MG per tablet Take 1 tablet by mouth daily.      Saw Palmetto 450 MG CAPS Take 450 mg by mouth daily.     Spacer/Aero-Holding Chambers (AEROCHAMBER MV) inhaler Use as instructed 1 each 0   SYMBICORT 160-4.5 MCG/ACT inhaler Inhale 2 puffs by mouth twice daily 10.2 g 5   Current Facility-Administered Medications  Medication Dose Route Frequency Provider Last Rate  Last Admin   incobotulinumtoxinA (XEOMIN) 100 units injection 400 Units  400 Units Intramuscular Q90 days YMarcial Pacas MD   400 Units at 12/15/17 1446    Allergies:   Patient has no known allergies.    Social History:  The patient  reports that he quit smoking about 34 years ago. His smoking use included cigarettes. He has a 22.50 pack-year smoking history. He has never used smokeless tobacco. He reports current alcohol use. He reports that he does not use drugs.   Family History:  The patient's family history includes Diabetes in his brother; Hodgkin's lymphoma in his brother; Lung cancer in his brother, brother, brother, and father; Stomach cancer in his mother.    ROS:   Please see the history of present illness.  (+) Shortness of breath  (+) Right knee pain All other systems are reviewed and negative.    PHYSICAL EXAM: VS:  BP 126/68 (BP Location: Left Arm, Patient Position: Sitting, Cuff Size: Normal)   Pulse (!) 56   Ht 5' 7"  (1.702 m)   Wt 211 lb 4.8 oz (95.8 kg)   BMI 33.09 kg/m  , BMI Body mass index is 33.09 kg/m. GENERAL:  Well appearing HEENT: Pupils equal round and reactive, fundi not visualized, oral mucosa unremarkable NECK:  No jugular venous distention, waveform within normal limits, carotid upstroke brisk and symmetric, no bruits LUNGS:  Clear to auscultation bilaterally HEART:  RRR.  PMI not displaced or sustained,S1 and S2 within normal limits, no S3, no S4, no clicks, no rubs, no murmurs ABD:  Flat, positive bowel sounds normal in frequency in pitch, no bruits, no rebound, no guarding, no midline pulsatile mass, no hepatomegaly, no splenomegaly EXT:  2+ radial pulses.  1+ DP/TP bilaterally.  No edema, no cyanosis no clubbing SKIN:  No rashes no nodules NEURO:  Cranial nerves II through XII grossly intact, motor grossly intact throughout PSYCH:  Cognitively intact, oriented to person place and time   EKG: EKG is personally reviewed.  10/07/21: Sinus  bradycardia. Rate 56 bpm. First degree av block  03/21/2019: Sinus bradycardia.  Rate 54 bpm.  First-degree AV block. 11/29/19: Sinus bradycardia.  Rate 58 bpm. 12/22/20: Sinus rhythm.  Rate  64 bpm.    Other studies reviewed:  Chest CT 05/05/2021: IMPRESSION: 1. Persistence of multifocal ground-glass attenuation nodular opacities the largest of which measures 0.8 cm. CT is recommended every 2 years until 5 years of stability has been established. This recommendation follows the consensus statement: Guidelines for Management of Incidental Pulmonary Nodules Detected on CT Images: From the Fleischner Society 2017; Radiology 2017; 284:228-243. 2. Please note that the minor fissure is under developed making differentiation between the upper and middle lobes difficult. 3. Numerous small 2-5 mm subpleural pulmonary nodules in the upper lungs have a benign appearance. 4. Aortic and coronary artery atherosclerotic vascular calcifications.   Aortic Atherosclerosis (ICD10-I70.0).   Unilateral Left Venous Doppler 04/11/2021: FINDINGS: Contralateral Common Femoral Vein: Respiratory phasicity is normal and symmetric with the symptomatic side. No evidence of thrombus. Normal compressibility.   Common Femoral Vein: No evidence of thrombus. Normal compressibility, respiratory phasicity and response to augmentation.   Saphenofemoral Junction: No evidence of thrombus. Normal compressibility and flow on color Doppler imaging.   Profunda Femoral Vein: No evidence of thrombus. Normal compressibility and flow on color Doppler imaging.   Femoral Vein: No evidence of thrombus. Normal compressibility, respiratory phasicity and response to augmentation.   Popliteal Vein: No evidence of thrombus. Normal compressibility, respiratory phasicity and response to augmentation.   Calf Veins: No evidence of thrombus. Normal compressibility and flow on color Doppler imaging.   Superficial Great Saphenous  Vein: No evidence of thrombus. Normal compressibility.   Venous Reflux:  None.   Other Findings:  None.   IMPRESSION: No evidence of DVT within the left lower extremity.   Lexiscan Myoview 04/2019: Baseline EKG showed NSR with early repolarization with 22m of ST elevation in inferior leads and V6. No change during Lexiscan infusion. The left ventricular ejection fraction is normal (55-65%). Nuclear stress EF: 60%. The study is normal. This is a low risk study.  Echo 04/03/2019: 1. Left ventricular ejection fraction, by estimation, is 60 to 65%. The  left ventricle has normal function. The left ventricle has no regional  wall motion abnormalities. Left ventricular diastolic parameters are  consistent with Grade I diastolic  dysfunction (impaired relaxation).   2. Right ventricular systolic function is normal. The right ventricular  size is normal. There is normal pulmonary artery systolic pressure.   3. The mitral valve is normal in structure and function. Trivial mitral  valve regurgitation. No evidence of mitral stenosis.   4. The aortic valve is tricuspid. Aortic valve regurgitation is not  visualized. Mild to moderate aortic valve sclerosis/calcification is  present, without any evidence of aortic stenosis.   5. The inferior vena cava is normal in size with greater than 50%  respiratory variability, suggesting right atrial pressure of 3 mmHg.   Comparison(s): 07/02/16 EF 60-65%. PA pressure 251mg.     30 Day Event Monitor 02/2017:  Quality: Fair.  Baseline artifact. Predominant rhythm: sinus   Rare PVCs No arrhythmias noted  Echo 07/02/16: Study Conclusions   - Left ventricle: The cavity size was normal. Wall thickness was   increased in a pattern of mild LVH. Systolic function was normal.   The estimated ejection fraction was in the range of 60% to 65%.   Wall motion was normal; there were no regional wall motion   abnormalities. Doppler parameters are consistent with  abnormal   left ventricular relaxation (grade 1 diastolic dysfunction). - Aortic valve: There was no stenosis. - Mitral valve: Mildly calcified annulus. There was trivial  regurgitation. - Right ventricle: The cavity size was normal. Systolic function   was normal. - Tricuspid valve: Peak RV-RA gradient (S): 26 mm Hg. - Pulmonary arteries: PA peak pressure: 29 mm Hg (S). - Inferior vena cava: The vessel was normal in size. The   respirophasic diameter changes were in the normal range (>= 50%),   consistent with normal central venous pressure.   Impressions:   - Normal LV size with mild LV hypertrophy, EF 60-65%. Normal RV   size and systolic function. No signficant valvular abnormalities.     Recent Labs: 07/31/2021: BUN 24; Creatinine, Ser 1.10; Hemoglobin 13.2; Platelets 200; Potassium 4.3; Sodium 144   03/29/18: Total cholesterol 172, triglycerides  321, HDL 36, LDL 72   01/15/2019: Total cholesterol 136, triglycerides 117, HDL 46  05/14/2019: WBC 6.5, hemoglobin 12.1, hematocrit sodium 142, potassium 4.6, BUN 18, creatinine 1.19 AST 19, ALT 15 Hemoglobin A1c 6.1% Total cholesterol 141, triglycerides 195, HDL 34, LDL 74 TSH 8.51  Lipid Panel    Component Value Date/Time   CHOL 167 11/22/2019 0828   TRIG 234 (H) 11/22/2019 0828   HDL 36 (L) 11/22/2019 0828   CHOLHDL 4.6 11/22/2019 0828   LDLCALC 91 11/22/2019 0828      Wt Readings from Last 3 Encounters:  10/07/21 211 lb 4.8 oz (95.8 kg)  08/12/21 218 lb (98.9 kg)  07/31/21 218 lb (98.9 kg)      ASSESSMENT AND PLAN:  No problem-specific Assessment & Plan notes found for this encounter. # Atrial fibrillation - Patient denies any recent episodes and believes the previous episode was a one-time occurrence during surgery. - Plan: Continue monitoring for any symptoms or recurrence. Follow up in one year or sooner if symptoms arise.  He is no longer on anticoagulation.  # Dyslipidemia - Patient is on cholesterol  medication and reports recent LDL 68.  - Plan: Continue current medication and monitor cholesterol levels through primary care physician. Follow up in one year or sooner if needed.  # Exertional dyspnea:  Resolved since adding Symbicort.  # OSA: Continue CPAP.  Medications Adjusted/Labs and Tests Ordered: Current medicines are reviewed at length with the patient today.  The patient does not have concerns regarding medicines.  The following changes have been made:  None  Labs/ tests ordered today include:   No orders of the defined types were placed in this encounter.   Disposition:   FU with Jatoria Kneeland C. Oval Linsey, MD, [facility] in 1 year.   I,Breanna Adamick,acting as a scribe for [name], MD.,have documented all relevant documentation on the behalf of [name], MD,as directed by  [name], MD while in the presence of [name], MD.   I, [place] Oval Linsey, MD have reviewed all documentation for this visit.  The documentation of the exam, diagnosis, procedures, and orders on 10/07/2021 are all accurate and complete.   Signed, Indianna Boran C. Oval Linsey, MD, [facility]  10/07/2021 3:56 PM    [place]

## 2021-11-15 ENCOUNTER — Other Ambulatory Visit: Payer: Self-pay | Admitting: Cardiovascular Disease

## 2021-11-16 NOTE — Telephone Encounter (Signed)
Rx(s) sent to pharmacy electronically.  

## 2022-02-04 ENCOUNTER — Other Ambulatory Visit: Payer: Self-pay | Admitting: *Deleted

## 2022-02-04 MED ORDER — LORATADINE 10 MG PO TABS: 10.0000 mg | ORAL_TABLET | Freq: Every day | ORAL | 11 refills | Status: DC

## 2022-02-23 ENCOUNTER — Other Ambulatory Visit: Payer: Self-pay | Admitting: Neurological Surgery

## 2022-02-23 DIAGNOSIS — M47816 Spondylosis without myelopathy or radiculopathy, lumbar region: Secondary | ICD-10-CM

## 2022-03-13 ENCOUNTER — Ambulatory Visit
Admission: RE | Admit: 2022-03-13 | Discharge: 2022-03-13 | Disposition: A | Discharge status: Home / Self Care | Payer: Medicare Other | Source: Ambulatory Visit | Attending: Neurological Surgery | Admitting: Neurological Surgery

## 2022-03-13 DIAGNOSIS — M47816 Spondylosis without myelopathy or radiculopathy, lumbar region: Secondary | ICD-10-CM

## 2022-04-12 ENCOUNTER — Ambulatory Visit (HOSPITAL_BASED_OUTPATIENT_CLINIC_OR_DEPARTMENT_OTHER): Payer: Medicare Other

## 2022-04-19 ENCOUNTER — Encounter: Payer: Self-pay | Admitting: *Deleted

## 2022-04-19 NOTE — Progress Notes (Unsigned)
PATIENT: [name] DOB: 09-12-1940  REASON FOR VISIT: follow up HISTORY FROM: patient PRIMARY NEUROLOGIST:   Virtual Visit via Video Note  I connected with [name] on 04/19/22 at  1:30 PM EDT by a video enabled telemedicine application located remotely at [facility] and verified that I am speaking with the correct person using two identifiers who was located at their own home.   I discussed the limitations of evaluation and management by telemedicine and the availability of in person appointments. The patient expressed understanding and agreed to proceed.   PATIENT: [name] DOB: 1940/11/12  REASON FOR VISIT: follow up HISTORY FROM: patient  HISTORY OF PRESENT ILLNESS: Today 04/19/22:  [name] is a 82 y.o. male with a history of OSA on CPAP. Returns today for follow-up.      04/07/21: Mr. [name] is an 82 year old male with a history of obstructive sleep apnea on CPAP.  He returns today for virtual visit.  He reports that the CPAP is working well for him.  Denies any new issues.  Download is below      REVIEW OF SYSTEMS: Out of a complete 14 system review of symptoms, the patient complains only of the following symptoms, and all other reviewed systems are negative.  ALLERGIES: No Known Allergies  HOME MEDICATIONS: Outpatient Medications Prior to Visit  Medication Sig Dispense Refill   albuterol (VENTOLIN HFA) 108 (90 Base) MCG/ACT inhaler Inhale 2 puffs into the lungs every 6 (six) hours as needed for wheezing or shortness of breath. 8 g 2   atorvastatin (LIPITOR) 80 MG tablet Take 40-80 mg by mouth See admin instructions. Takes 80 mg by mouth daily on Monday, Wednesday, Fridays and take 40 mg by mouth on Tue, Thurs, Sat, and Sun     docusate sodium (COLACE) 100 MG capsule Take 200 mg by mouth at bedtime.     fenofibrate (TRICOR) 145 MG tablet Take 1 tablet by mouth once daily 90 tablet 2   glipiZIDE (GLUCOTROL) 5 MG  tablet Take 5 mg by mouth every morning.     Glucosamine-Chondroit-Vit C-Mn (GLUCOSAMINE 1500 COMPLEX PO) Take 1 capsule by mouth daily.     levothyroxine (SYNTHROID) 75 MCG tablet Take 75-150 mcg by mouth See admin instructions. Take 168mg by mouth daily on Monday, Wednesday and Friday and take 75 mcg by mouth daily on Tue, Thurs, Sat, and Sun     loratadine (CLARITIN) 10 MG tablet Take 1 tablet (10 mg total) by mouth daily. 30 tablet 11   methocarbamol (ROBAXIN) 500 MG tablet Take 1 tablet (500 mg total) by mouth every 6 (six) hours as needed for muscle spasms. 40 tablet 0   Multiple Vitamins-Minerals (MENS MULTIVITAMIN) TABS Take 1 tablet by mouth daily.     NON FORMULARY Pt uses a cpap nightly     omeprazole (PRILOSEC) 20 MG capsule Take 20 mg by mouth daily.     oxyCODONE (OXY IR/ROXICODONE) 5 MG immediate release tablet Take 1-2 tablets (5-10 mg total) by mouth every 6 (six) hours as needed. 30 tablet 0   pioglitazone-metformin (ACTOPLUS MET) 15-500 MG per tablet Take 1 tablet by mouth daily.      Saw Palmetto 450 MG CAPS Take 450 mg by mouth daily.     Spacer/Aero-Holding Chambers (AEROCHAMBER MV) inhaler Use as instructed 1 each 0   SYMBICORT 160-4.5 MCG/ACT inhaler Inhale 2 puffs by mouth twice daily 10.2 g 5   Facility-Administered Medications Prior  to Visit  Medication Dose Route Frequency Provider Last Rate Last Admin   incobotulinumtoxinA (XEOMIN) 100 units injection 400 Units  400 Units Intramuscular Q90 days Marcial Pacas, MD   400 Units at 12/15/17 1446    PAST MEDICAL HISTORY: Past Medical History:  Diagnosis Date   Arthritis    "hands; probably in my back too" (08/28/2015); osteo related   Barrett's esophagus with dysplasia    Chronic lower back pain    DDD   Closed patellar sleeve fracture of left knee    Constipation    COPD (chronic obstructive pulmonary disease) ([place])    Crush injury knee    bilateral   Dysphagia    EGD with dilitation   Dysrhythmia    thought  pt. had a fib after surgery saw cardiology later and ok meds preventive per pt.   Enlarged prostate    sees a urologist   Exertional dyspnea 03/21/2019   Hard of hearing    Hyperlipidemia    takes Lipitor and Fish Oil daily   Insomnia    d/t pain   Joint pain    Joint swelling    Left tibial fracture    Multiple duodenal ulcers    Nocturia    OSA on CPAP    cpap   Pure hypercholesterolemia 05/22/2019   Type II diabetes mellitus ([place])    type 2   Unsteady gait    Urinary frequency    Wears glasses     PAST SURGICAL HISTORY: Past Surgical History:  Procedure Laterality Date   ANTERIOR CERVICAL DECOMP/DISCECTOMY FUSION  10/2010   ANTERIOR LAT LUMBAR FUSION  12/02/2011   Procedure: ANTERIOR LATERAL LUMBAR FUSION 1 LEVEL;  Surgeon: Eustace Moore, MD;  Location: [place] [facility];  Service: Neurosurgery;  Laterality: Left;  left lumbar three-four   BACK SURGERY     CATARACT EXTRACTION, BILATERAL Bilateral    about a month apart.    COLONOSCOPY  04/2007   ESOPHAGOGASTRODUODENOSCOPY     ESOPHAGOGASTRODUODENOSCOPY  04/2007; 07/2008   ESOPHAGOGASTRODUODENOSCOPY (EGD) WITH ESOPHAGEAL DILATION  "2-3 times"   EXCISIONAL HEMORRHOIDECTOMY  ~ [name] Left 12/08/2016   Procedure: LEFT KNEE FEMORAL REVISION;  Surgeon: Gaynelle Arabian, MD;  Location: [facility];  Service: Orthopedics;  Laterality: Left;  Adductor Block   FINGER SURGERY Right    pinky finger   JOINT REPLACEMENT     KNEE ARTHROTOMY Left 07/24/2018   Procedure: Left knee arthrotomy with scar excision;  Surgeon: Gaynelle Arabian, MD;  Location: [facility];  Service: Orthopedics;  Laterality: Left;   KNEE ARTHROTOMY Left 05/02/2019   Procedure: Left knee arthrotomy; scar excision;  Surgeon: Gaynelle Arabian, MD;  Location: [facility];  Service: Orthopedics;  Laterality: Left;  55mn   KNEE ARTHROTOMY Left 08/12/2021   Procedure: Left knee arthrotomy; scar excision;  Surgeon: AGaynelle Arabian MD;  Location: [facility];  Service: Orthopedics;   Laterality: Left;   KNEE CLOSED REDUCTION Left 06/21/2016   Procedure: CLOSED MANIPULATION LEFT KNEE;  Surgeon: AGaynelle Arabian MD;  Location: [facility];  Service: Orthopedics;  Laterality: Left;   KNEE CLOSED REDUCTION Left 02/28/2017   Procedure: CLOSED MANIPULATION KNEE;  Surgeon: AGaynelle Arabian MD;  Location: [facility];  Service: Orthopedics;  Laterality: Left;   KNEE JOINT MANIPULATION  06/2009   LAPAROSCOPIC CHOLECYSTECTOMY  02/2000   LUMBAR FUSION  03/16/2012   Dr JRonnald Ramp  ORIF TIBIA PLATEAU Left 09/24/2015   Procedure: OPEN REDUCTION INTERNAL FIXATION (ORIF) LEFT TIBIAL  PLATEAU;  Surgeon: Gaynelle Arabian, MD;  Location: [facility];  Service: Orthopedics;  Laterality: Left;   PARTIAL KNEE ARTHROPLASTY Left 07/30/2015   Procedure: UNICOMPARTMENTAL LEFT KNEE MEDIAL;  Surgeon: Gaynelle Arabian, MD;  Location: [facility];  Service: Orthopedics;  Laterality: Left;   POSTERIOR LUMBAR FUSION  04/2015   rods inserted L2   REFRACTIVE SURGERY Right 1980s   SHOULDER OPEN ROTATOR CUFF REPAIR Bilateral 1990s   TENNIS ELBOW RELEASE/NIRSCHEL PROCEDURE Right 1980s   TOTAL KNEE ARTHROPLASTY Right 03/2009   TOTAL KNEE ARTHROPLASTY WITH REVISION COMPONENTS Left 03/03/2016   Procedure: REVISION LEFT KNEE UNICOMPARTMENTAL ARTHROPLASTY TO TOTAL KNEE ARTHROPLASTY;  Surgeon: Gaynelle Arabian, MD;  Location: [facility];  Service: Orthopedics;  Laterality: Left;  requests 2hrs; Adductor Block    FAMILY HISTORY: Family History  Problem Relation Age of Onset   Diabetes Brother    Lung cancer Brother    Stomach cancer Mother    Lung cancer Father    Lung cancer Brother    Hodgkin's lymphoma Brother    Lung cancer Brother     SOCIAL HISTORY: Social History   Socioeconomic History   Marital status: Married    Spouse name: Not on file   Number of children: 6   Years of education: 16   Highest education level: Bachelor's degree (e.g., BA, AB, BS)  Occupational History   Occupation: Part-time office work  Tobacco Use   Smoking  status: Former    Packs/day: 1.50    Years: 15.00    Total pack years: 22.50    Types: Cigarettes    Quit date: 1989    Years since quitting: 35.2   Smokeless tobacco: Never  Vaping Use   Vaping Use: Never used  Substance and Sexual Activity   Alcohol use: Yes    Comment: beer occasionally   Drug use: No   Sexual activity: Not Currently  Other Topics Concern   Not on file  Social History Narrative   Lives at home with his wife.   Right-handed.   3 cups caffeine per day.   Social Determinants of Health   Financial Resource Strain: Not on file  Food Insecurity: Not on file  Transportation Needs: Not on file  Physical Activity: Not on file  Stress: Not on file  Social Connections: Not on file  Intimate Partner Violence: Not on file      PHYSICAL EXAM Generalized: Well developed, in no acute distress   Neurological examination  Mentation: Alert oriented to time, place, history taking. Follows all commands speech and language fluent Cranial nerve II-XII:Extraocular movements were full. Facial symmetry noted.  Head turning and shoulder shrug  were normal and symmetric.     DIAGNOSTIC DATA (LABS, IMAGING, TESTING) - I reviewed patient records, labs, notes, testing and imaging myself where available.  Lab Results  Component Value Date   WBC 4.8 07/31/2021   HGB 13.2 07/31/2021   HCT 36.0 (L) 07/31/2021   MCV 103.2 (H) 07/31/2021   PLT 200 07/31/2021      Component Value Date/Time   NA 144 07/31/2021 0949   NA 137 11/22/2019 0828   K 4.3 07/31/2021 0949   CL 114 (H) 07/31/2021 0949   CO2 26 07/31/2021 0949   GLUCOSE 112 (H) 07/31/2021 0949   BUN 24 (H) 07/31/2021 0949   BUN 16 11/22/2019 0828   CREATININE 1.10 07/31/2021 0949   CALCIUM 9.8 07/31/2021 0949   PROT 5.9 (L) 11/22/2019 0828   ALBUMIN 4.0 11/22/2019 [id number]  AST 15 11/22/2019 0828   ALT 19 11/22/2019 0828   ALKPHOS 64 11/22/2019 0828   BILITOT 1.0 11/22/2019 0828   GFRNONAA >60 07/31/2021 0949    GFRAA 91 11/22/2019 0828   Lab Results  Component Value Date   CHOL 167 11/22/2019   HDL 36 (L) 11/22/2019   LDLCALC 91 11/22/2019   TRIG 234 (H) 11/22/2019   CHOLHDL 4.6 11/22/2019   Lab Results  Component Value Date   HGBA1C 6.4 (H) 07/31/2021   No results found for: "VITAMINB12" Lab Results  Component Value Date   TSH 4.710 (H) 10/06/2016      ASSESSMENT AND PLAN 82 y.o. year old male  has a past medical history of Arthritis, Barrett's esophagus with dysplasia, Chronic lower back pain, Closed patellar sleeve fracture of left knee, Constipation, COPD (chronic obstructive pulmonary disease) ([place]), Crush injury knee, Dysphagia, Dysrhythmia, Enlarged prostate, Exertional dyspnea (03/21/2019), Hard of hearing, Hyperlipidemia, Insomnia, Joint pain, Joint swelling, Left tibial fracture, Multiple duodenal ulcers, Nocturia, OSA on CPAP, Pure hypercholesterolemia (05/22/2019), Type II diabetes mellitus ([place]), Unsteady gait, Urinary frequency, and Wears glasses. here with:  OSA on CPAP  CPAP compliance excellent Residual AHI is good Encouraged patient to continue using CPAP nightly and > 4 hours each night F/U in 1 year or sooner if needed    Ward Givens, MSN, NP-C 04/19/2022, 4:16 PM [facility] [address] [place], [place] [address] [phone]

## 2022-04-20 ENCOUNTER — Telehealth (INDEPENDENT_AMBULATORY_CARE_PROVIDER_SITE_OTHER): Payer: Medicare Other | Admitting: Adult Health

## 2022-04-20 DIAGNOSIS — G4733 Obstructive sleep apnea (adult) (pediatric): Secondary | ICD-10-CM | POA: Diagnosis not present

## 2022-04-29 ENCOUNTER — Ambulatory Visit (HOSPITAL_BASED_OUTPATIENT_CLINIC_OR_DEPARTMENT_OTHER): Payer: Medicare Other

## 2022-05-06 ENCOUNTER — Ambulatory Visit (HOSPITAL_BASED_OUTPATIENT_CLINIC_OR_DEPARTMENT_OTHER)
Admission: RE | Admit: 2022-05-06 | Discharge: 2022-05-06 | Disposition: A | Discharge status: Home / Self Care | Payer: Medicare Other | Source: Ambulatory Visit | Attending: Internal Medicine | Admitting: Internal Medicine

## 2022-05-06 DIAGNOSIS — R911 Solitary pulmonary nodule: Secondary | ICD-10-CM | POA: Diagnosis present

## 2022-05-21 NOTE — Progress Notes (Signed)
Will discuss during may viist  Xxxxxxx  IMPRESSION: 1. Small pulmonary nodules, most of which are considered definitively benign at this time. The exception includes 2 subsolid nodules in the right middle lobe measuring up to 9 x 6 mm. Repeat noncontrast chest CT is recommended in 2 years to ensure stability. This recommendation follows the consensus statement: Guidelines for Management of Incidental Pulmonary Nodules Detected on CT Images: From the Fleischner Society 2017; Radiology 2017; 284:228-243. 2. Aortic atherosclerosis, in addition to left main and three-vessel coronary artery disease. 3. There are calcifications of the aortic valve. Echocardiographic correlation for evaluation of potential valvular dysfunction may be warranted if clinically indicated.  Aortic Atherosclerosis (ICD10-I70.0).   Electronically Signed   By: Trudie Reed M.D.   On: 05/08/2022 13:03

## 2022-06-10 NOTE — Progress Notes (Signed)
OV 01/06/2021  Subjective:  Patient ID: [name], male , DOB: 02/27/1940 , age 82 y.o. , MRN: [id number] , ADDRESS: [address] [place] [address] PCP Pearson Grippe, MD Patient Care Team: Pearson Grippe, MD as PCP - General (Internal Medicine) Chilton Si, MD as PCP - Cardiology (Cardiology) Ollen Gross, MD as Consulting Physician (Orthopedic Surgery)  This Provider for this visit: Treatment Team:  Attending Provider: Kalman Shan, MD    01/06/2021 -   Chief Complaint  Patient presents with   Consult    Pt is being referred due to SOB. Pt states his SOB is worse with exertion but can have some SOB even at rest.     HPI [name] 82 y.o. -82 year old male referred by Dr. Chilton Si.  Whose note from 12/22/2020 I reviewed problems for shortness of breath with exertion and cough and wheezing.  He is a remote greater than 20 pack smoker.  He tells me that has had insidious onset of shortness of breath for over a year.  Is unable to quantify further.  It slightly progressive since it started.  Moderate intensity no associated chest pain.  He is also had intermittent wheezing particularly in the daytime but never during sleep associated with this for the same amount of time.  For the last month or 2 is also had a chronic dry cough.  The cough does not wake him up in the night but is present during the daytime.  He is not on ACE inhibitor.  He is on amiodarone.  He had a normal cardiac stress test.  He has diabetes.  He also has sleep apnea for which he uses CPAP.  In 2012 he was in the ER for pneumonia and kept overnight.  Visualized that CT scan of the chest and shows bilateral lower lobe atelectasis/infiltrates.  No further follow-up imaging available.    Xxxxxxxxxxxxxxxxxxxxxxxxx Rest of the history as below.   June 2022 he saw ENT at [facility].  This was for otitis externa.  And this is past medical history is listed as diabetes and back  surgery.  Medication review shows amiodarone, Lipitor, diabetes medications, Flomax, Synthroid, gabapentin and PPI amongst others    He is obese He has OSA and Remote smoker Grade 1 diastolic dysfunction Normal stress test 2021    His last set of labs was a year ago he had hyperglycemia at 176 blood sugar.  Creatinine normal.  He has seen Dr. Chilton Si  Visit February 2021 echocardiogram read by Dr. Carolanne Grumbling that shows grade 1 diastolic dysfunction but otherwise normal.  Then in March 2020 when he had a normal cardiac stress test nuclear medicine.  In 2017 there was a CPAP titration study that shows AHI of 12.1.  Simple office walk 185 feet x  3 laps goal with forehead probe 01/06/2021    O2 used ra   Number laps completed Avg limp with left leg   Comments about pace 3   Resting Pulse Ox/HR 100% and 65/min   Final Pulse Ox/HR 96% and 97/min   Desaturated </= 88% no   Desaturated <= 3% points Yes, 4   Got Tachycardic >/= 90/min yes   Symptoms at end of test Moderate to severe dyspea   Miscellaneous comments bese    CT angio Sept 2012 - visualized -  Comparison:  Chest radiograph performed 10/30/2010   Findings:  There is no evidence of pulmonary embolus.   Mild  patchy opacities are noted at the lung bases; this raises  suspicion for pneumonia, given additional vague peribronchial  opacities within the right lower lobe and peribronchial thickening.  No significant pleural effusion or pneumothorax is seen.  No masses  are identified; no abnormal focal contrast enhancement is seen.   Scattered small periaortic, right paratracheal, aortopulmonary and  subcarinal nodes are seen, without significant mediastinal  lymphadenopathy.  No pericardial effusion is identified.  The great  vessels are unremarkable in appearance.  No axillary  lymphadenopathy is seen.  The thyroid gland is unremarkable in  appearance.    PFT  No flowsheet data found.     02/06/2021:  Today -PFT follow up Patient presents today for follow up after pulmonary function testing which showed COPD/asthma overlap with a very mild restriction in lung volume/function and a normal diffusion capacity with positive bronchodilator response. He continues to experience dyspnea with exertion and occasion wheeze. He reports his cough is more of a tickle in his throat. He does also have allergy symptoms including sneezing and itchy/watery eyes with occasional nasal congestion that is clear. He denies orthopnea, PND, chest pain, or lower extremity swelling. He is not currently on any pulmonary medications. He continues on omeprazole daily with control of his reflux. He is compliant with his CPAP nightly.   No Known Allergie  CT Chest data   TEST/EVENTS:  2017 CPAP titration: AHI 12.1/hr 03/2019 echocardiogram: G1 DD, otherwise normal 01/21/2021 HRCT chest: Atherosclerosis.  No lymphadenopathy.  Scattered areas of nodular groundglass in the right lung measure up to 10 mm in the lateral segment right middle lobe.  Millimetric upper lobe pulmonary nodules, similar and likely postinfectious in etiology.  Repeat CT in 3 to 6 months recommended.  No evidence of ILD. 02/06/2021 PFTs: FVC 2.56 (73), FEV1 1.72 (70), ratio 67, TLC 5.04 (78), DLCO corrected 22.05 (99), +BD. No results found.  OV 03/17/2021  Subjective:  Patient ID: [name], male , DOB: May 06, 1940 , age 82 y.o. , MRN: [id number] , ADDRESS: [address] [place] [address] PCP [name], DO Patient Care Team: [name], DO as PCP - General (Family Medicine) Chilton Si, MD as PCP - Cardiology (Cardiology) Ollen Gross, MD as Consulting Physician (Orthopedic Surgery)  This Provider for this visit: Treatment Team:  Attending Provider: Kalman Shan, MD    03/17/2021 -   Chief Complaint  Patient presents with   Follow-up    Pt states he has been doing okay since last visit and denies any complaints.      HPI [name] 82 y.o. - 82 year old male, former smoker (22.5 pack years) followed for dyspnea upon exertion.  He is a patient Dr. Jane Canary and was last seen in office on 01/06/2021.  Past medical history significant for PAF, allergic rhinitis, OSA on CPAP (followed by neurology), GERD, DM2, obesity, hypertriglyceridemia, status post knee replacement.   Presents for follow-up  #Dyspnea and wheezing: Last visit he saw a Publishing rights manager.  She put him on Symbicort.  After this shortness of breath is significantly improved.  He went to [facility] was able to walk long distances without any problems.  He was very surprised pleasantly.  His wheezing also has resolved.  He is super happy about this.  He does say that the Symbicort dry powder does cause some sensation of the throat.  He finds his voice not to be the same.  He also singing is not the same.  I did indicate  to him there is a common side effect with inhalers.  Proposed switching to another inhaler or changing to a nebulizer.  But for the moment we have resolved to continue the same.  #New finding he has a right middle lobe 10 mm nodule.  It is groundglass.  He is a former smoker.  Did discuss with him the possibility that this could be early stage lung cancer versus benign lesions.  I put the probably defer this is low-moderate.  Did indicate to him that he needs CT scan of the chest in 3 months.  Also spoke to him about potential research protocol that does a nasal swab and sent for genomic analysis.  The nasal swab was to help adjust the pretest probably of malignancy.  He is very interested in this.  He is willing to read the consent and if he agrees with the consent then he will try to enroll today.  I have contacted the research coordinator Trish Mage for this.    CT chest 01/21/21  Lungs/Pleura: Negative for subpleural reticulation, traction bronchiectasis/bronchiolectasis, ground-glass, architectural distortion or  honeycombing. Scattered areas of nodular ground-glass in the right lung measure up to 10 mm in the lateral segment right middle lobe (5/87). Comparison with the prior exam is challenging due to expiratory phase imaging on that study. Millimetric upper lobe pulmonary nodules, similar and likely postinfectious in etiology. No pleural fluid. Airway is unremarkable. No air trapping.   Upper Abdomen: Visualized portions of the liver, adrenal glands, spleen, pancreas, stomach and bowel are unremarkable.   Musculoskeletal: Degenerative changes in the spine. No worrisome lytic or sclerotic lesions.   IMPRESSION: 1. No evidence of interstitial lung disease. 2. Vague areas of nodular ground-glass in the right lung. Non-contrast chest CT at 3-6 months is recommended. If nodules persist, subsequent management will be based upon the most suspicious nodule(s). This recommendation follows the consensus statement: Guidelines for Management of Incidental Pulmonary Nodules Detected on CT Images: From the Fleischner Society 2017; Radiology 2017; [id number]. 3. Aortic atherosclerosis (ICD10-I70.0). Coronary artery calcification.     Electronically Signed   By: Leanna Battles M.D.   On: 01/23/2021 09:00    OV 06/11/2022  Subjective:  Patient ID: [name], male , DOB: [birth year] , age 30 y.o. , MRN: [id number] , ADDRESS: [address] [place] [address] PCP [name], DO Patient Care Team: [name], DO as PCP - General (Family Medicine) Chilton Si, MD as PCP - Cardiology (Cardiology) Ollen Gross, MD as Consulting Physician (Orthopedic Surgery)  This Provider for this visit: Treatment Team:  Attending Provider: Kalman Shan, MD    06/11/2022 -   Chief Complaint  Patient presents with   Follow-up    F/up on CT scan   #Moderate persistent asthma #'s pulmonary nodule right middle lobe former cigarette smoker  HPI [name] 82 y.o. -returns  for follow-up.  He states he is doing well.  Without his Symbicort he starts wheezing with his Symbicort his wheezing is improved and well-controlled.  He prefers to stay on the high-dose of Symbicort.  We did AIRIQ score.  He scored 1 out of 10.  This shows very good control and production for good control over the next 1 year.  No emergency room visits no hospitalizations no steroid use.  He has had his RSV vaccine.  In terms of his lung nodule he had a CT scan of the chest that shows the groundglass opacity in the right middle lobe  is stable.  Surveillance is recommended.  I will do a CT scan in 1 year which is slightly different from the recommendation.  Social: His daughter age 45.  The middle of 6 children passed away from lupus all of a sudden abruptly on Valentine's Day 07-10-22.  His brother also died last week.  He is an appropriate grief reaction because of this.   PFT     Latest Ref Rng & Units 02/06/2021    8:57 AM  PFT Results  FVC-Pre L 2.18   FVC-Predicted Pre % 62   FVC-Post L 2.56   FVC-Predicted Post % 73   Pre FEV1/FVC % % 69   Post FEV1/FCV % % 67   FEV1-Pre L 1.51   FEV1-Predicted Pre % 61   FEV1-Post L 1.72   DLCO uncorrected ml/min/mmHg 20.99   DLCO UNC% % 95   DLCO corrected ml/min/mmHg 22.05   DLCO COR %Predicted % 99   DLVA Predicted % 110     CT Chest 05/06/22  CLINICAL DATA:  82 year old male with history of pulmonary nodule. Followup study.   EXAM: CT CHEST WITHOUT CONTRAST   TECHNIQUE: Multidetector CT imaging of the chest was performed following the standard protocol without IV contrast.   RADIATION DOSE REDUCTION: This exam was performed according to the departmental dose-optimization program which includes automated exposure control, adjustment of the mA and/or kV according to patient size and/or use of iterative reconstruction technique.   COMPARISON:  Chest CT 05/05/2021.   FINDINGS: Cardiovascular: Heart size is normal. There is no  significant pericardial fluid, thickening or pericardial calcification. There is aortic atherosclerosis, as well as atherosclerosis of the great vessels of the mediastinum and the coronary arteries, including calcified atherosclerotic plaque in the left main, left anterior descending, left circumflex and right coronary arteries. Calcifications of the aortic valve.   Mediastinum/Nodes: No pathologically enlarged mediastinal or hilar lymph nodes. Please note that accurate exclusion of hilar adenopathy is limited on noncontrast CT scans. Esophagus is unremarkable in appearance. No axillary lymphadenopathy.   Lungs/Pleura: Again noted are multiple tiny 2-3 mm solid-appearing pulmonary nodules scattered throughout the periphery of the lungs bilaterally, stable in number and size compared to prior examinations dating back to 01/21/2021, considered definitively benign. There also several sub solid nodules in the lungs, also very similar to prior examinations, largest of which are in the right middle lobe on axial image 82 of series 4 measuring 9 x 6 mm and on axial image 89 of series 4 measuring 7 x 5 mm. No other larger more suspicious appearing pulmonary nodules or masses are noted. No acute consolidative airspace disease. No pleural effusions.   Upper Abdomen: Aortic atherosclerosis.   Musculoskeletal: Orthopedic fixation hardware in the lower cervical spine incidentally noted. There are no aggressive appearing lytic or blastic lesions noted in the visualized portions of the skeleton.   IMPRESSION: 1. Small pulmonary nodules, most of which are considered definitively benign at this time. The exception includes 2 subsolid nodules in the right middle lobe measuring up to 9 x 6 mm. Repeat noncontrast chest CT is recommended in 2 years to ensure stability. This recommendation follows the consensus statement: Guidelines for Management of Incidental Pulmonary Nodules Detected on CT  Images: From the Fleischner Society Jul 10, 2015; Radiology July 10, 2015; 284:228-243. 2. Aortic atherosclerosis, in addition to left main and three-vessel coronary artery disease. 3. There are calcifications of the aortic valve. Echocardiographic correlation for evaluation of potential valvular dysfunction may be warranted if clinically indicated.  Aortic Atherosclerosis (ICD10-I70.0).     Electronically Signed   By: Trudie Reed M.D.   On: 05/08/2022 13:03     has a past medical history of Arthritis, Barrett's esophagus with dysplasia, Chronic lower back pain, Closed patellar sleeve fracture of left knee, Constipation, COPD (chronic obstructive pulmonary disease) (HCC), Crush injury knee, Dysphagia, Dysrhythmia, Enlarged prostate, Exertional dyspnea (03/21/2019), Hard of hearing, Hyperlipidemia, Insomnia, Joint pain, Joint swelling, Left tibial fracture, Multiple duodenal ulcers, Nocturia, OSA on CPAP, Pure hypercholesterolemia (05/22/2019), Type II diabetes mellitus (HCC), Unsteady gait, Urinary frequency, and Wears glasses.   reports that he quit smoking about 35 years ago. His smoking use included cigarettes. He has a 22.50 pack-year smoking history. He has never used smokeless tobacco.  Past Surgical History:  Procedure Laterality Date   ANTERIOR CERVICAL DECOMP/DISCECTOMY FUSION  10/2010   ANTERIOR LAT LUMBAR FUSION  12/02/2011   Procedure: ANTERIOR LATERAL LUMBAR FUSION 1 LEVEL;  Surgeon: Tia Alert, MD;  Location: [facility];  Service: Neurosurgery;  Laterality: Left;  left lumbar three-four   BACK SURGERY     CATARACT EXTRACTION, BILATERAL Bilateral    about a month apart.    COLONOSCOPY  04/2007   ESOPHAGOGASTRODUODENOSCOPY     ESOPHAGOGASTRODUODENOSCOPY  04/2007; 07/2008   ESOPHAGOGASTRODUODENOSCOPY (EGD) WITH ESOPHAGEAL DILATION  "2-3 times"   EXCISIONAL HEMORRHOIDECTOMY  ~ 1966   FEMORAL REVISION Left 12/08/2016   Procedure: LEFT KNEE FEMORAL REVISION;  Surgeon: Ollen Gross,  MD;  Location: [facility];  Service: Orthopedics;  Laterality: Left;  Adductor Block   FINGER SURGERY Right    pinky finger   JOINT REPLACEMENT     KNEE ARTHROTOMY Left 07/24/2018   Procedure: Left knee arthrotomy with scar excision;  Surgeon: Ollen Gross, MD;  Location: [facility];  Service: Orthopedics;  Laterality: Left;   KNEE ARTHROTOMY Left 05/02/2019   Procedure: Left knee arthrotomy; scar excision;  Surgeon: Ollen Gross, MD;  Location: [facility];  Service: Orthopedics;  Laterality: Left;    KNEE ARTHROTOMY Left 08/12/2021   Procedure: Left knee arthrotomy; scar excision;  Surgeon: Ollen Gross, MD;  Location: [facility];  Service: Orthopedics;  Laterality: Left;   KNEE CLOSED REDUCTION Left 06/21/2016   Procedure: CLOSED MANIPULATION LEFT KNEE;  Surgeon: Ollen Gross, MD;  Location: [facility];  Service: Orthopedics;  Laterality: Left;   KNEE CLOSED REDUCTION Left 02/28/2017   Procedure: CLOSED MANIPULATION KNEE;  Surgeon: Ollen Gross, MD;  Location: [facility];  Service: Orthopedics;  Laterality: Left;   KNEE JOINT MANIPULATION  06/2009   LAPAROSCOPIC CHOLECYSTECTOMY  02/2000   LUMBAR FUSION  03/16/2012   Dr Yetta Barre   ORIF TIBIA PLATEAU Left 09/24/2015   Procedure: OPEN REDUCTION INTERNAL FIXATION (ORIF) LEFT TIBIAL PLATEAU;  Surgeon: Ollen Gross, MD;  Location: [facility];  Service: Orthopedics;  Laterality: Left;   PARTIAL KNEE ARTHROPLASTY Left 07/30/2015   Procedure: UNICOMPARTMENTAL LEFT KNEE MEDIAL;  Surgeon: Ollen Gross, MD;  Location: [facility];  Service: Orthopedics;  Laterality: Left;   POSTERIOR LUMBAR FUSION  04/2015   rods inserted L2   REFRACTIVE SURGERY Right 1980s   SHOULDER OPEN ROTATOR CUFF REPAIR Bilateral 1990s   TENNIS ELBOW RELEASE/NIRSCHEL PROCEDURE Right 1980s   TOTAL KNEE ARTHROPLASTY Right 03/2009   TOTAL KNEE ARTHROPLASTY WITH REVISION COMPONENTS Left 03/03/2016   Procedure: REVISION LEFT KNEE UNICOMPARTMENTAL ARTHROPLASTY TO TOTAL KNEE ARTHROPLASTY;  Surgeon: Ollen Gross, MD;  Location: [facility];  Service: Orthopedics;  Laterality: Left;  requests 2hrs; Adductor Block  No Known Allergies  Immunization History  Administered Date(s) Administered   Influenza, High Dose Seasonal PF 11/25/2015   Influenza, Quadrivalent, Recombinant, Inj, Pf 11/18/2016, 10/05/2018, 12/04/2020   Influenza-Unspecified 12/15/2012, 11/01/2013, 11/02/2014, 11/24/2016   PFIZER(Purple Top)SARS-COV-2 Vaccination 02/23/2019, 03/16/2019, 12/05/2019   PPD Test 09/02/2015   Pneumococcal Conjugate-13 11/02/2014   Pneumococcal Polysaccharide-23 03/17/2007, 12/03/2011, 10/29/2012, 11/25/2015   Zoster, Live 01/23/2014    Family History  Problem Relation Age of Onset   Diabetes Brother    Lung cancer Brother    Stomach cancer Mother    Lung cancer Father    Lung cancer Brother    Hodgkin's lymphoma Brother    Lung cancer Brother      Current Outpatient Medications:    albuterol (VENTOLIN HFA) 108 (90 Base) MCG/ACT inhaler, Inhale 2 puffs into the lungs every 6 (six) hours as needed for wheezing or shortness of breath., Disp: 8 g, Rfl: 2   atorvastatin (LIPITOR) 80 MG tablet, Take 40-80 mg by mouth See admin instructions. Takes 80 mg by mouth daily on Monday, Wednesday, Fridays and take 40 mg by mouth on Tue, Thurs, Sat, and Sun, Disp: , Rfl:    docusate sodium (COLACE) 100 MG capsule, Take 200 mg by mouth at bedtime., Disp: , Rfl:    fenofibrate (TRICOR) 145 MG tablet, Take 1 tablet by mouth once daily, Disp: 90 tablet, Rfl: 2   glipiZIDE (GLUCOTROL) 5 MG tablet, Take 5 mg by mouth every morning., Disp: , Rfl:    Glucosamine-Chondroit-Vit C-Mn (GLUCOSAMINE 1500 COMPLEX PO), Take 1 capsule by mouth daily., Disp: , Rfl:    levothyroxine (SYNTHROID) 75 MCG tablet, Take 75-150 mcg by mouth See admin instructions. Take by mouth daily on Monday, Wednesday and Friday and take 75 mcg by mouth daily on Tue, Thurs, Sat, and Sun, Disp: , Rfl:    loratadine (CLARITIN) 10 MG tablet,  Take 1 tablet (10 mg total) by mouth daily., Disp: 30 tablet, Rfl: 11   methocarbamol (ROBAXIN) 500 MG tablet, Take 1 tablet (500 mg total) by mouth every 6 (six) hours as needed for muscle spasms., Disp: 40 tablet, Rfl: 0   Multiple Vitamins-Minerals (MENS MULTIVITAMIN) TABS, Take 1 tablet by mouth daily., Disp: , Rfl:    NON FORMULARY, Pt uses a cpap nightly, Disp: , Rfl:    omeprazole (PRILOSEC) 20 MG capsule, Take 20 mg by mouth daily., Disp: , Rfl:    oxyCODONE (OXY IR/ROXICODONE) 5 MG immediate release tablet, Take 1-2 tablets (5-10 mg total) by mouth every 6 (six) hours as needed., Disp: 30 tablet, Rfl: 0   pioglitazone-metformin (ACTOPLUS MET) 15-500 MG per tablet, Take 1 tablet by mouth daily. , Disp: , Rfl:    Saw Palmetto 450 MG CAPS, Take 450 mg by mouth daily., Disp: , Rfl:    Spacer/Aero-Holding Chambers (AEROCHAMBER MV) inhaler, Use as instructed, Disp: 1 each, Rfl: 0   SYMBICORT 160-4.5 MCG/ACT inhaler, Inhale 2 puffs by mouth twice daily, Disp: 10.2 g, Rfl: 5  Current Facility-Administered Medications:    incobotulinumtoxinA (XEOMIN) 100 units injection 400 Units, 400 Units, Intramuscular, Q90 days, Levert Feinstein, MD, 400 Units at 12/15/17 1446      Objective:   Vitals:   06/11/22 0946  BP: 130/70  Pulse: 72  SpO2: 94%  Weight: 213 lb 6.4 oz (96.8 kg)  Height: 5\' 7"  (1.702 m)    Estimated body mass index is 33.42 kg/m as calculated from the following:   Height as of this encounter: 5\' 7"  (  1.702 m).   Weight as of this encounter: 213 lb 6.4 oz (96.8 kg).  @WEIGHTCHANGE @  [employer]   06/11/22 0946  Weight: 213 lb 6.4 oz (96.8 kg)     Physical Exam General: No distress. Has cane. Looks well Neuro: Alert and Oriented x 3. GCS 15. Speech normal Psych: Pleasant Resp:  Barrel Chest - no.  Wheeze - no, Crackles - no, No overt respiratory distress CVS: Normal heart sounds. Murmurs - no Ext: Stigmata of Connective Tissue Disease - no HEENT: Normal upper airway.  PEERL +. No post nasal drip        Assessment:       ICD-10-CM   1. Moderate persistent asthma without complication  J45.40     2. Solitary pulmonary nodule  R91.1     3. Former heavy cigarette smoker (20-39 per day)  Z87.891          Plan:     Patient Instructions     ICD-10-CM   1. Moderate persistent asthma without complication  J45.40     2. Pulmonary nodule  R91.1     3. Former heavy cigarette smoker (20-39 per day)  Z87.891       Moderate persistent asthma without complication -diagnoses given in 2023  Asthma is well-controlled with Symbicort but without it do get symptomatic.  Seems to benefiting from the high-dose  Plan -Symbicort 160/4.52 puffs 2 times daily  -CMA to do refill -Adderall as needed  -Limited to refill  Pulmonary nodule RML x 3 -10mm nodular density dec 2022 - > unchanged march 2023 -> Mrch 2024 Former heavy cigarette smoker (20-39 per day)  - stable and odds low this is lung cancer but requires a total of 3 to 5-year surveillance  Plan  - CT chest without contrast May/June 2025    Followup May/June 2025 after CT scan    SIGNATURE    Dr. Kalman Shan, M.D., F.C.C.P,  Pulmonary and Critical Care Medicine Staff Physician, [facility] Director - Interstitial Lung Disease  Program  Pulmonary Fibrosis [facility] at [facility] [place], [place], [address]  Pager: [phone], If no answer or between  15:00h - 7:00h: call [phone] Telephone: [phone]  10:01 AM 06/11/2022

## 2022-06-10 NOTE — Patient Instructions (Addendum)
ICD-10-CM   1. Moderate persistent asthma without complication  J45.40     2. Pulmonary nodule  R91.1     3. Former heavy cigarette smoker (20-39 per day)  Z87.891       Moderate persistent asthma without complication -diagnoses given in 2023  Asthma is well-controlled with Symbicort but without it do get symptomatic.  Seems to benefiting from the high-dose  Plan -Symbicort 160/4.52 puffs 2 times daily  -CMA to do refill -Adderall as needed  -Limited to refill  Pulmonary nodule RML x 3 -10mm nodular density dec 2022 - > unchanged march 2023 -> Mrch 2024 Former heavy cigarette smoker (20-39 per day)  - stable and odds low this is lung cancer but requires a total of 3 to 5-year surveillance  Plan  - CT chest without contrast May/June 2025    Followup May/June 2025 after CT scan

## 2022-06-11 ENCOUNTER — Encounter: Payer: Self-pay | Admitting: Internal Medicine

## 2022-06-11 ENCOUNTER — Ambulatory Visit (INDEPENDENT_AMBULATORY_CARE_PROVIDER_SITE_OTHER): Payer: Medicare Other | Admitting: Internal Medicine

## 2022-06-11 VITALS — BP 130/70 | HR 72 | Ht 67.0 in | Wt 213.4 lb

## 2022-06-11 DIAGNOSIS — J4489 Other specified chronic obstructive pulmonary disease: Secondary | ICD-10-CM | POA: Diagnosis not present

## 2022-06-11 DIAGNOSIS — R911 Solitary pulmonary nodule: Secondary | ICD-10-CM | POA: Diagnosis not present

## 2022-06-11 DIAGNOSIS — Z87891 Personal history of nicotine dependence: Secondary | ICD-10-CM | POA: Diagnosis not present

## 2022-06-11 DIAGNOSIS — J4540 Moderate persistent asthma, uncomplicated: Secondary | ICD-10-CM | POA: Diagnosis not present

## 2022-06-11 NOTE — Addendum Note (Signed)
Addended by: Hedda Slade on: 06/11/2022 11:46 AM   Modules accepted: Orders

## 2022-10-04 ENCOUNTER — Other Ambulatory Visit: Payer: Self-pay | Admitting: Cardiovascular Disease

## 2022-11-28 ENCOUNTER — Emergency Department (HOSPITAL_BASED_OUTPATIENT_CLINIC_OR_DEPARTMENT_OTHER)
Admission: EM | Admit: 2022-11-28 | Discharge: 2022-11-28 | Disposition: A | Discharge status: Home / Self Care | Payer: Medicare Other | Attending: Emergency Medicine | Admitting: Emergency Medicine

## 2022-11-28 ENCOUNTER — Other Ambulatory Visit: Payer: Self-pay

## 2022-11-28 ENCOUNTER — Encounter (HOSPITAL_BASED_OUTPATIENT_CLINIC_OR_DEPARTMENT_OTHER): Payer: Self-pay | Admitting: Emergency Medicine

## 2022-11-28 DIAGNOSIS — L03211 Cellulitis of face: Secondary | ICD-10-CM | POA: Diagnosis not present

## 2022-11-28 DIAGNOSIS — Z23 Encounter for immunization: Secondary | ICD-10-CM | POA: Insufficient documentation

## 2022-11-28 DIAGNOSIS — R21 Rash and other nonspecific skin eruption: Secondary | ICD-10-CM | POA: Diagnosis present

## 2022-11-28 MED ORDER — TETANUS-DIPHTH-ACELL PERTUSSIS 5-2.5-18.5 LF-MCG/0.5 IM SUSY
0.5000 mL | PREFILLED_SYRINGE | Freq: Once | INTRAMUSCULAR | Status: AC
  Administered 2022-11-28: 0.5 mL via INTRAMUSCULAR
  Filled 2022-11-28: qty 0.5

## 2022-11-28 MED ORDER — DOXYCYCLINE HYCLATE 100 MG PO CAPS: 100.0000 mg | ORAL_CAPSULE | Freq: Two times a day (BID) | ORAL | 0 refills | Status: AC

## 2022-11-28 MED ORDER — DOXYCYCLINE HYCLATE 100 MG PO TABS
100.0000 mg | ORAL_TABLET | Freq: Once | ORAL | Status: AC
Start: 2022-11-28 — End: 2022-11-28
  Administered 2022-11-28: 100 mg via ORAL
  Filled 2022-11-28: qty 1

## 2022-11-28 NOTE — ED Triage Notes (Signed)
Pt states he had what he thought was a pimple on his R cheekbone that started 3 days ago. Now he has a red area that has gotten bigger. Denies fever.

## 2022-11-28 NOTE — ED Provider Notes (Signed)
[place] [facility] AT [facility] [place] Provider Note   CSN: [id number] Arrival date & time: 11/28/22  0423     History  No chief complaint on file.   [name] is a 82 y.o. male here with rash on right face, began as pimple 3 days ago, now spreading redness.  No fevers.  HPI     Home Medications Prior to Admission medications   Medication Sig Start Date End Date Taking? Authorizing Provider  doxycycline (VIBRAMYCIN) 100 MG capsule Take 1 capsule (100 mg total) by mouth 2 (two) times daily for 7 days. 11/28/22 12/05/22 Yes Ashwin Tibbs, Kermit Balo, MD  albuterol (VENTOLIN HFA) 108 (90 Base) MCG/ACT inhaler Inhale 2 puffs into the lungs every 6 (six) hours as needed for wheezing or shortness of breath. 02/06/21   Cobb, Ruby Cola, NP  atorvastatin (LIPITOR) 80 MG tablet Take 40-80 mg by mouth See admin instructions. Takes 80 mg by mouth daily on Monday, Wednesday, Fridays and take 40 mg by mouth on Tue, Thurs, Sat, and Sun    [provider]  docusate sodium (COLACE) 100 MG capsule Take 200 mg by mouth at bedtime.    [provider]  fenofibrate (TRICOR) 145 MG tablet Take 1 tablet (145 mg total) by mouth daily. NEED APPOINTMENT 10/04/22   Chilton Si, MD  glipiZIDE (GLUCOTROL) 5 MG tablet Take 5 mg by mouth every morning. 07/17/21   [provider]  Glucosamine-Chondroit-Vit C-Mn (GLUCOSAMINE 1500 COMPLEX PO) Take 1 capsule by mouth daily.    [provider]  levothyroxine (SYNTHROID) 75 MCG tablet Take 75-150 mcg by mouth See admin instructions. Take by mouth daily on Monday, Wednesday and Friday and take 75 mcg by mouth daily on Tue, Thurs, Sat, and Sun    [provider]  loratadine (CLARITIN) 10 MG tablet Take 1 tablet (10 mg total) by mouth daily. 02/04/22   Kalman Shan, MD  methocarbamol (ROBAXIN) 500 MG tablet Take 1 tablet (500 mg total) by mouth every 6 (six) hours as needed for muscle spasms.  08/12/21   Edmisten, Lyn Hollingshead, PA  Multiple Vitamins-Minerals (MENS MULTIVITAMIN) TABS Take 1 tablet by mouth daily.    [provider]  NON FORMULARY Pt uses a cpap nightly    [provider]  omeprazole (PRILOSEC) 20 MG capsule Take 20 mg by mouth daily.    [provider]  oxyCODONE (OXY IR/ROXICODONE) 5 MG immediate release tablet Take 1-2 tablets (5-10 mg total) by mouth every 6 (six) hours as needed. 08/12/21   Edmisten, Kristie L, PA  pioglitazone-metformin (ACTOPLUS MET) 15-500 MG per tablet Take 1 tablet by mouth daily.     [provider]  Saw Palmetto 450 MG CAPS Take 450 mg by mouth daily.    [provider]  Spacer/Aero-Holding Chambers (AEROCHAMBER MV) inhaler Use as instructed 05/15/21   Bevelyn Ngo, NP  [facility] 160-4.5 MCG/ACT inhaler Inhale 2 puffs by mouth twice daily 08/07/21   Allison Quarry Ruby Cola, NP      Allergies    Patient has no known allergies.    Review of Systems   Review of Systems  Physical Exam Updated Vital Signs BP (!) 124/53 (BP Location: Right Arm)   Pulse 86   Temp 99 F (37.2 C) (Oral)   Resp 20   Ht 5\' 7"  (1.702 m)   Wt 95.3 kg   SpO2 95%   BMI 32.89 kg/m  Physical Exam Constitutional:  General: He is not in acute distress. HENT:     Head: Normocephalic and atraumatic.     Comments: Pinpoint lesion on the right cheek with surrounding circular erythema, some induration underlying without fluctuance, no notable swelling of the parotid gland Eyes:     Conjunctiva/sclera: Conjunctivae normal.     Pupils: Pupils are equal, round, and reactive to light.  Cardiovascular:     Rate and Rhythm: Normal rate and regular rhythm.  Pulmonary:     Effort: Pulmonary effort is normal. No respiratory distress.  Abdominal:     General: There is no distension.     Tenderness: There is no abdominal tenderness.  Skin:    General: Skin is warm and dry.  Neurological:     General: No focal deficit present.      Mental Status: He is alert. Mental status is at baseline.  Psychiatric:        Mood and Affect: Mood normal.        Behavior: Behavior normal.     ED Results / Procedures / Treatments   Labs (all labs ordered are listed, but only abnormal results are displayed) Labs Reviewed - No data to display  EKG None  Radiology No results found.  Procedures Procedures    Medications Ordered in ED Medications  doxycycline (VIBRA-TABS) tablet 100 mg (has no administration in time range)  Tdap (BOOSTRIX) injection 0.5 mL (0.5 mLs Intramuscular Given 11/28/22 0508)    ED Course/ Medical Decision Making/ A&P                                 Medical Decision Making Risk Prescription drug management.   Patient is here with suspected facial cellulitis.  This may have been an insect sting or a pimple as a root cause.  This appears to be isolated to the right cheek.  I do not see evidence of involvement of the eye, or intracranial extension.  No indication for CT imaging.  We will start the patient on doxycycline and prescribe this for home.        Final Clinical Impression(s) / ED Diagnoses Final diagnoses:  Facial cellulitis    Rx / DC Orders ED Discharge Orders          Ordered    doxycycline (VIBRAMYCIN) 100 MG capsule  2 times daily        11/28/22 0731              Terald Sleeper, MD 11/28/22 [phone]

## 2022-12-25 ENCOUNTER — Encounter (HOSPITAL_COMMUNITY): Payer: Self-pay

## 2022-12-25 ENCOUNTER — Emergency Department (HOSPITAL_COMMUNITY): Payer: Medicare Other

## 2022-12-25 ENCOUNTER — Emergency Department (HOSPITAL_COMMUNITY)
Admission: EM | Admit: 2022-12-25 | Discharge: 2022-12-25 | Disposition: A | Discharge status: Home / Self Care | Payer: Medicare Other | Attending: Emergency Medicine | Admitting: Emergency Medicine

## 2022-12-25 DIAGNOSIS — S63502A Unspecified sprain of left wrist, initial encounter: Secondary | ICD-10-CM | POA: Diagnosis not present

## 2022-12-25 DIAGNOSIS — R030 Elevated blood-pressure reading, without diagnosis of hypertension: Secondary | ICD-10-CM | POA: Insufficient documentation

## 2022-12-25 DIAGNOSIS — S0083XA Contusion of other part of head, initial encounter: Secondary | ICD-10-CM | POA: Insufficient documentation

## 2022-12-25 DIAGNOSIS — W010XXA Fall on same level from slipping, tripping and stumbling without subsequent striking against object, initial encounter: Secondary | ICD-10-CM | POA: Diagnosis not present

## 2022-12-25 DIAGNOSIS — S0990XA Unspecified injury of head, initial encounter: Secondary | ICD-10-CM | POA: Diagnosis present

## 2022-12-25 MED ORDER — ACETAMINOPHEN 500 MG PO TABS
1000.0000 mg | ORAL_TABLET | Freq: Once | ORAL | Status: AC
  Administered 2022-12-25: 1000 mg via ORAL
  Filled 2022-12-25: qty 2

## 2022-12-25 MED ORDER — IBUPROFEN 200 MG PO TABS
400.0000 mg | ORAL_TABLET | Freq: Once | ORAL | Status: AC
  Administered 2022-12-25: 400 mg via ORAL
  Filled 2022-12-25: qty 2

## 2022-12-25 NOTE — ED Triage Notes (Signed)
Pt c/o L wrist pain and abrasion to L face r/t a trip and fall this afternoon.  Pain score 4/10.  No blood thinners.   Denies LOC.  Pt is at neuro baseline.

## 2022-12-25 NOTE — ED Provider Notes (Signed)
[place] EMERGENCY DEPARTMENT AT [facility] Provider Note   CSN: [id number] Arrival date & time: 12/25/22  1555     History  Chief Complaint  Patient presents with   Fall   Wrist Injury    [name] is a 82 y.o. male.  Pt presents s/p trip and fall. Indicates was outside, back up, and foot trip between driveway and [place]. No faintness or dizziness prior to fall. Was asymptomatic all day including prior to fall. No loc with fall. C/o left wrist pain post fall, constant, dull, worse w palpation. Minor contusion/abrasion to left cheek. Tetanus up to date in past few months. No headache. No neck or back pain. No chest pain or sob. No abd pain or nv. Denies other extremity pain or injury. Has been ambulatory since fall. No anticoagulant use. Right hand dominant.   The history is provided by the patient, the spouse and medical records.  Fall Pertinent negatives include no chest pain, no abdominal pain, no headaches and no shortness of breath.  Wrist Injury Associated symptoms: no fever        Home Medications Prior to Admission medications   Medication Sig Start Date End Date Taking? Authorizing Provider  albuterol (VENTOLIN HFA) 108 (90 Base) MCG/ACT inhaler Inhale 2 puffs into the lungs every 6 (six) hours as needed for wheezing or shortness of breath. 02/06/21   Cobb, Ruby Cola, NP  atorvastatin (LIPITOR) 80 MG tablet Take 40-80 mg by mouth See admin instructions. Takes 80 mg by mouth daily on Monday, Wednesday, Fridays and take 40 mg by mouth on Tue, Thurs, Sat, and Sun    [provider]  docusate sodium (COLACE) 100 MG capsule Take 200 mg by mouth at bedtime.    [provider]  fenofibrate (TRICOR) 145 MG tablet Take 1 tablet (145 mg total) by mouth daily. NEED APPOINTMENT 10/04/22   Chilton Si, MD  glipiZIDE (GLUCOTROL) 5 MG tablet Take 5 mg by mouth every morning. 07/17/21   [provider]   Glucosamine-Chondroit-Vit C-Mn (GLUCOSAMINE 1500 COMPLEX PO) Take 1 capsule by mouth daily.    [provider]  levothyroxine (SYNTHROID) 75 MCG tablet Take 75-150 mcg by mouth See admin instructions. Take by mouth daily on Monday, Wednesday and Friday and take 75 mcg by mouth daily on Tue, Thurs, Sat, and Sun    [provider]  loratadine (CLARITIN) 10 MG tablet Take 1 tablet (10 mg total) by mouth daily. 02/04/22   Kalman Shan, MD  methocarbamol (ROBAXIN) 500 MG tablet Take 1 tablet (500 mg total) by mouth every 6 (six) hours as needed for muscle spasms. 08/12/21   Edmisten, Lyn Hollingshead, PA  Multiple Vitamins-Minerals (MENS MULTIVITAMIN) TABS Take 1 tablet by mouth daily.    [provider]  NON FORMULARY Pt uses a cpap nightly    [provider]  omeprazole (PRILOSEC) 20 MG capsule Take 20 mg by mouth daily.    [provider]  oxyCODONE (OXY IR/ROXICODONE) 5 MG immediate release tablet Take 1-2 tablets (5-10 mg total) by mouth every 6 (six) hours as needed. 08/12/21   Edmisten, Kristie L, PA  pioglitazone-metformin (ACTOPLUS MET) 15-500 MG per tablet Take 1 tablet by mouth daily.     [provider]  Saw Palmetto 450 MG CAPS Take 450 mg by mouth daily.    [provider]  Spacer/Aero-Holding Chambers (AEROCHAMBER MV) inhaler Use as instructed 05/15/21   Bevelyn Ngo, NP  [facility]  160-4.5 MCG/ACT inhaler Inhale 2 puffs by mouth twice daily 08/07/21   Cobb, Ruby Cola, NP      Allergies    Patient has no known allergies.    Review of Systems   Review of Systems  Constitutional:  Negative for fever.  Respiratory:  Negative for shortness of breath.   Cardiovascular:  Negative for chest pain.  Gastrointestinal:  Negative for abdominal pain and vomiting.  Musculoskeletal:        Left wrist pain  Neurological:  Negative for weakness, numbness and headaches.    Physical Exam Updated Vital Signs BP (!) 152/74   Temp  98 F (36.7 C)   Resp 17   Ht 1.702 m (5\' 7" )   Wt 95.3 kg   SpO2 96%   BMI 32.89 kg/m  Physical Exam Vitals and nursing note reviewed.  Constitutional:      Appearance: Normal appearance. He is well-developed.  HENT:     Head:     Comments: Small contusion/superficial abrasion left cheek.     Nose: Nose normal.     Mouth/Throat:     Mouth: Mucous membranes are moist.     Pharynx: Oropharynx is clear.  Eyes:     General: No scleral icterus.    Conjunctiva/sclera: Conjunctivae normal.     Pupils: Pupils are equal, round, and reactive to light.  Neck:     Trachea: No tracheal deviation.  Cardiovascular:     Rate and Rhythm: Normal rate and regular rhythm.     Pulses: Normal pulses.     Heart sounds: Normal heart sounds. No murmur heard.    No friction rub. No gallop.  Pulmonary:     Effort: Pulmonary effort is normal. No accessory muscle usage or respiratory distress.     Breath sounds: Normal breath sounds.  Chest:     Chest wall: No tenderness.  Abdominal:     General: There is no distension.     Palpations: Abdomen is soft.     Tenderness: There is no abdominal tenderness.  Musculoskeletal:        General: No swelling.     Cervical back: Normal range of motion and neck supple. No rigidity or tenderness.     Comments: C spine non tender, aligned, normal rom. Tenderness left wrist diffusely. Skin overlying wrist is intact. Radial pulse 2+. No pain or tenderness at elbow or shoulder. No focal scaphoid tenderness. Otherwise good rom bil extremities without other pain or focal bony tenderness.   Skin:    General: Skin is warm and dry.     Findings: No rash.  Neurological:     Mental Status: He is alert.     Comments: Alert, speech clear. GCS 15. Motor/sens grossly intact bil. Steady gait.   Psychiatric:        Mood and Affect: Mood normal.     ED Results / Procedures / Treatments   Labs (all labs ordered are listed, but only abnormal results are displayed) Labs  Reviewed - No data to display  EKG None  Radiology DG Wrist Complete Left  Result Date: 12/25/2022 CLINICAL DATA:  Pain post fall. EXAM: LEFT WRIST - COMPLETE 3+ VIEW COMPARISON:  None Available. FINDINGS: There is no evidence of fracture or dislocation. There is no evidence of arthropathy or other focal bone abnormality. Soft tissues are unremarkable. IMPRESSION: Negative. Electronically Signed   By: Ted Mcalpine M.D.   On: 12/25/2022 17:28    Procedures Procedures    Medications  Ordered in ED Medications  acetaminophen (TYLENOL) tablet 1,000 mg (has no administration in time range)  ibuprofen (ADVIL) tablet 400 mg (has no administration in time range)    ED Course/ Medical Decision Making/ A&P                                 Medical Decision Making Problems Addressed: Contusion of face, initial encounter: acute illness or injury Elevated blood pressure reading: acute illness or injury Fall from slip, trip, or stumble, initial encounter: acute illness or injury with systemic symptoms that poses a threat to life or bodily functions Sprain of left wrist, initial encounter: acute illness or injury that poses a threat to life or bodily functions  Amount and/or Complexity of Data Reviewed Independent Historian: spouse    Details: hx External Data Reviewed: notes. Radiology: ordered and independent interpretation performed. Decision-making details documented in ED Course.  Risk OTC drugs.  Imaging ordered.   Reviewed nursing notes and prior charts for additional history.   Icepack.   Xrays reviewed/interpreted by me - no fx. Discussed xr, and diff w pt including contusion, sprain, and occult fx.   Splint. Acetaminophen, ibuprofen.  Pcp f/u.  Return precautions provided.           Final Clinical Impression(s) / ED Diagnoses Final diagnoses:  None    Rx / DC Orders ED Discharge Orders     None         Cathren Laine, MD 12/25/22 1820

## 2022-12-25 NOTE — Discharge Instructions (Addendum)
It was our pleasure to provide your ER care today - we hope that you feel better.  Our radiologist saw no acute fracture - most likely there is a contusion/sprain of your wrist. However, if pain fails to improve/resolve in the next 1-2 weeks, follow up with your doctor for repeat xray (as there is the possibility of an occult fracture).   Take acetaminophen or ibuprofen as need. Wear wrist brace as need for comfort/support for the next week.  Return to ER if worse, new symptoms, new/severe pain, severe headache, numbness/weakness, fainting, or other concern.

## 2022-12-25 NOTE — ED Notes (Signed)
ED Provider at bedside. 

## 2022-12-25 NOTE — Progress Notes (Signed)
Orthopedic Tech Progress Note Patient Details:  KEEGIN FIGGERS Dec 04, 1940 [id number]  Ortho Devices Type of Ortho Device: Velcro wrist splint Ortho Device/Splint Location: LUE Ortho Device/Splint Interventions: Ordered, Application, Adjustment   Post Interventions Patient Tolerated: Well Instructions Provided: Adjustment of device, Care of device  Tonye Pearson 12/25/2022, 6:32 PM

## 2023-01-13 ENCOUNTER — Telehealth: Payer: Self-pay

## 2023-01-13 NOTE — Telephone Encounter (Signed)
Transition Care Management Unsuccessful Follow-up Telephone Call  Date of discharge and from where:  12/25/2022 [facility]  Attempts:  1st Attempt  Reason for unsuccessful TCM follow-up call:  No answer/busy  Makala Fetterolf [facility]  [facility], [facility] Resource Care Guide Direct Dial: [phone]  Website: [email or web address]

## 2023-01-14 ENCOUNTER — Telehealth: Payer: Self-pay

## 2023-01-14 NOTE — Telephone Encounter (Signed)
Transition Care Management Follow-up Telephone Call Date of discharge and from where: 12/25/2022 [facility] How have you been since you were released from the hospital? Patient stated his wrist is healing well but he still has some soreness. Any questions or concerns? No  Items Reviewed: Did the pt receive and understand the discharge instructions provided? Yes  Medications obtained and verified?  No medication prescribed this visit. Patient was able to obtain OTC meds. Other? No  Any new allergies since your discharge? No  Dietary orders reviewed? Yes Do you have support at home? Yes   Follow up appointments reviewed:  PCP Hospital f/u appt confirmed? No  Scheduled to see  on  @ . Specialist Hospital f/u appt confirmed? No  Scheduled to see  on  @ . Are transportation arrangements needed? No  If their condition worsens, is the pt aware to call PCP or go to the Emergency Dept.? Yes Was the patient provided with contact information for the PCP's office or ED? Yes Was to pt encouraged to call back with questions or concerns? Yes   [name] [name]  [facility], [facility] Guide Direct Dial: [phone]  Website: [email or web address]

## 2023-01-26 ENCOUNTER — Other Ambulatory Visit: Payer: Self-pay | Admitting: Cardiovascular Disease

## 2023-03-15 ENCOUNTER — Other Ambulatory Visit: Payer: Self-pay | Admitting: Internal Medicine

## 2023-04-18 NOTE — Progress Notes (Unsigned)
 Setup Date: 07/29/15

## 2023-04-19 ENCOUNTER — Encounter: Payer: Medicare Other | Admitting: Adult Health

## 2023-04-19 NOTE — Progress Notes (Signed)
 Patient was unable to connect through a virtual visit.  He was rescheduled for an office visitThis encounter was created in error - please disregard.

## 2023-04-19 NOTE — Progress Notes (Deleted)
 PATIENT: [name] DOB: [birth year]  REASON FOR VISIT: follow up HISTORY FROM: patient PRIMARY NEUROLOGIST:   Virtual Visit via Video Note  I connected with [name] on 04/19/23 at  1:45 PM EDT by a video enabled telemedicine application located remotely at [facility] and verified that I am speaking with the correct person using two identifiers who was located at their own home.   I discussed the limitations of evaluation and management by telemedicine and the availability of in person appointments. The patient expressed understanding and agreed to proceed.   PATIENT: [name] DOB: 01/24/1941  REASON FOR VISIT: follow up HISTORY FROM: patient  HISTORY OF PRESENT ILLNESS: Today 04/19/23:  [name] is a 83 y.o. male with a history of OSA on CPAP. Returns today for follow-up.      04/20/22: [name] is a 83 y.o. male with a history of OSA on CPAP. Returns today for follow-up.  Reports that the CPAP continues to work well for him.  He denies any new issues.  States that he cannot sleep without the machine.  Download is below     04/07/21: Mr. No is an 83 year old male with a history of obstructive sleep apnea on CPAP.  He returns today for virtual visit.  He reports that the CPAP is working well for him.  Denies any new issues.  Download is below      REVIEW OF SYSTEMS: Out of a complete 14 system review of symptoms, the patient complains only of the following symptoms, and all other reviewed systems are negative.  ALLERGIES: No Known Allergies  HOME MEDICATIONS: Outpatient Medications Prior to Visit  Medication Sig Dispense Refill   albuterol (VENTOLIN HFA) 108 (90 Base) MCG/ACT inhaler Inhale 2 puffs into the lungs every 6 (six) hours as needed for wheezing or shortness of breath. 8 g 2   atorvastatin (LIPITOR) 80 MG tablet Take 40-80 mg by mouth See admin instructions. Takes 80 mg by mouth daily on Monday,  Wednesday, Fridays and take 40 mg by mouth on Tue, Thurs, Sat, and Sun     docusate sodium (COLACE) 100 MG capsule Take 200 mg by mouth at bedtime.     fenofibrate (TRICOR) 145 MG tablet Take 1 tablet (145 mg total) by mouth daily. Please call [phone] to schedule overdue appointment for further refills. 90 tablet 0   glipiZIDE (GLUCOTROL) 5 MG tablet Take 5 mg by mouth every morning.     Glucosamine-Chondroit-Vit C-Mn (GLUCOSAMINE 1500 COMPLEX PO) Take 1 capsule by mouth daily.     levothyroxine (SYNTHROID) 75 MCG tablet Take 75-150 mcg by mouth See admin instructions. Take by mouth daily on Monday, Wednesday and Friday and take 75 mcg by mouth daily on Tue, Thurs, Sat, and Sun     loratadine (CLARITIN) 10 MG tablet Take 1 tablet by mouth once daily 30 tablet 0   methocarbamol (ROBAXIN) 500 MG tablet Take 1 tablet (500 mg total) by mouth every 6 (six) hours as needed for muscle spasms. 40 tablet 0   Multiple Vitamins-Minerals (MENS MULTIVITAMIN) TABS Take 1 tablet by mouth daily.     NON FORMULARY Pt uses a cpap nightly     omeprazole (PRILOSEC) 20 MG capsule Take 20 mg by mouth daily.     oxyCODONE (OXY IR/ROXICODONE) 5 MG immediate release tablet Take 1-2 tablets (5-10 mg total) by mouth every 6 (six) hours as needed. 30 tablet 0   pioglitazone-metformin (  ACTOPLUS MET) 15-500 MG per tablet Take 1 tablet by mouth daily.      Saw Palmetto 450 MG CAPS Take 450 mg by mouth daily.     Spacer/Aero-Holding Chambers (AEROCHAMBER MV) inhaler Use as instructed 1 each 0   SYMBICORT 160-4.5 MCG/ACT inhaler Inhale 2 puffs by mouth twice daily 10.2 g 5   Facility-Administered Medications Prior to Visit  Medication Dose Route Frequency Provider Last Rate Last Admin   incobotulinumtoxinA (XEOMIN) 100 units injection 400 Units  400 Units Intramuscular Q90 days Levert Feinstein, MD   400 Units at 12/15/17 1446    PAST MEDICAL HISTORY: Past Medical History:  Diagnosis Date   Arthritis    "hands;  probably in my back too" (08/28/2015); osteo related   Barrett's esophagus with dysplasia    Chronic lower back pain    DDD   Closed patellar sleeve fracture of left knee    Constipation    COPD (chronic obstructive pulmonary disease) (HCC)    Crush injury knee    bilateral   Dysphagia    EGD with dilitation   Dysrhythmia    thought pt. had a fib after surgery saw cardiology later and ok meds preventive per pt.   Enlarged prostate    sees a urologist   Exertional dyspnea 03/21/2019   Hard of hearing    Hyperlipidemia    takes Lipitor and Fish Oil daily   Insomnia    d/t pain   Joint pain    Joint swelling    Left tibial fracture    Multiple duodenal ulcers    Nocturia    OSA on CPAP    cpap   Pure hypercholesterolemia 05/22/2019   Type II diabetes mellitus (HCC)    type 2   Unsteady gait    Urinary frequency    Wears glasses     PAST SURGICAL HISTORY: Past Surgical History:  Procedure Laterality Date   ANTERIOR CERVICAL DECOMP/DISCECTOMY FUSION  10/2010   ANTERIOR LAT LUMBAR FUSION  12/02/2011   Procedure: ANTERIOR LATERAL LUMBAR FUSION 1 LEVEL;  Surgeon: Tia Alert, MD;  Location: [facility];  Service: Neurosurgery;  Laterality: Left;  left lumbar three-four   BACK SURGERY     CATARACT EXTRACTION, BILATERAL Bilateral    about a month apart.    COLONOSCOPY  04/2007   ESOPHAGOGASTRODUODENOSCOPY     ESOPHAGOGASTRODUODENOSCOPY  04/2007; 07/2008   ESOPHAGOGASTRODUODENOSCOPY (EGD) WITH ESOPHAGEAL DILATION  "2-3 times"   EXCISIONAL HEMORRHOIDECTOMY  ~ 1966   FEMORAL REVISION Left 12/08/2016   Procedure: LEFT KNEE FEMORAL REVISION;  Surgeon: Ollen Gross, MD;  Location: [facility];  Service: Orthopedics;  Laterality: Left;  Adductor Block   FINGER SURGERY Right    pinky finger   JOINT REPLACEMENT     KNEE ARTHROTOMY Left 07/24/2018   Procedure: Left knee arthrotomy with scar excision;  Surgeon: Ollen Gross, MD;  Location: [facility];  Service: Orthopedics;  Laterality:  Left;   KNEE ARTHROTOMY Left 05/02/2019   Procedure: Left knee arthrotomy; scar excision;  Surgeon: Ollen Gross, MD;  Location: [facility];  Service: Orthopedics;  Laterality: Left;    KNEE ARTHROTOMY Left 08/12/2021   Procedure: Left knee arthrotomy; scar excision;  Surgeon: Ollen Gross, MD;  Location: [facility];  Service: Orthopedics;  Laterality: Left;   KNEE CLOSED REDUCTION Left 06/21/2016   Procedure: CLOSED MANIPULATION LEFT KNEE;  Surgeon: Ollen Gross, MD;  Location: [facility];  Service: Orthopedics;  Laterality: Left;   KNEE CLOSED  REDUCTION Left 02/28/2017   Procedure: CLOSED MANIPULATION KNEE;  Surgeon: Ollen Gross, MD;  Location: [facility];  Service: Orthopedics;  Laterality: Left;   KNEE JOINT MANIPULATION  06/2009   LAPAROSCOPIC CHOLECYSTECTOMY  02/2000   LUMBAR FUSION  03/16/2012   Dr Yetta Barre   ORIF TIBIA PLATEAU Left 09/24/2015   Procedure: OPEN REDUCTION INTERNAL FIXATION (ORIF) LEFT TIBIAL PLATEAU;  Surgeon: Ollen Gross, MD;  Location: [facility];  Service: Orthopedics;  Laterality: Left;   PARTIAL KNEE ARTHROPLASTY Left 07/30/2015   Procedure: UNICOMPARTMENTAL LEFT KNEE MEDIAL;  Surgeon: Ollen Gross, MD;  Location: [facility];  Service: Orthopedics;  Laterality: Left;   POSTERIOR LUMBAR FUSION  04/2015   rods inserted L2   REFRACTIVE SURGERY Right 1980s   SHOULDER OPEN ROTATOR CUFF REPAIR Bilateral 1990s   TENNIS ELBOW RELEASE/NIRSCHEL PROCEDURE Right 1980s   TOTAL KNEE ARTHROPLASTY Right 03/2009   TOTAL KNEE ARTHROPLASTY WITH REVISION COMPONENTS Left 03/03/2016   Procedure: REVISION LEFT KNEE UNICOMPARTMENTAL ARTHROPLASTY TO TOTAL KNEE ARTHROPLASTY;  Surgeon: Ollen Gross, MD;  Location: [facility];  Service: Orthopedics;  Laterality: Left;  requests 2hrs; Adductor Block    FAMILY HISTORY: Family History  Problem Relation Age of Onset   Diabetes Brother    Lung cancer Brother    Stomach cancer Mother    Lung cancer Father    Lung cancer Brother    Hodgkin's lymphoma Brother     Lung cancer Brother     SOCIAL HISTORY: Social History   Socioeconomic History   Marital status: Married    Spouse name: Not on file   Number of children: 6   Years of education: 16   Highest education level: Bachelor's degree (e.g., BA, AB, BS)  Occupational History   Occupation: Part-time office work  Tobacco Use   Smoking status: Former    Current packs/day: 0.00    Average packs/day: 1.5 packs/day for 15.0 years (22.5 ttl pk-yrs)    Types: Cigarettes    Start date: 13    Quit date: 1989    Years since quitting: 36.2   Smokeless tobacco: Never  Vaping Use   Vaping status: Never Used  Substance and Sexual Activity   Alcohol use: Yes    Comment: beer occasionally   Drug use: No   Sexual activity: Not Currently  Other Topics Concern   Not on file  Social History Narrative   Lives at home with his wife.   Right-handed.   3 cups caffeine per day.   Social Drivers of Corporate investment banker Strain: Not on file  Food Insecurity: Not on file  Transportation Needs: Not on file  Physical Activity: Not on file  Stress: Not on file  Social Connections: Not on file  Intimate Partner Violence: Not on file      PHYSICAL EXAM Generalized: Well developed, in no acute distress   Neurological examination  Mentation: Alert oriented to time, place, history taking. Follows all commands speech and language fluent Cranial nerve II-XII:  facial symmetry noted    DIAGNOSTIC DATA (LABS, IMAGING, TESTING) - I reviewed patient records, labs, notes, testing and imaging myself where available.  Lab Results  Component Value Date   WBC 4.8 07/31/2021   HGB 13.2 07/31/2021   HCT 36.0 (L) 07/31/2021   MCV 103.2 (H) 07/31/2021   PLT 200 07/31/2021      Component Value Date/Time   NA 144 07/31/2021 0949   NA 137 11/22/2019 0828   K 4.3 07/31/2021 0949  CL 114 (H) 07/31/2021 0949   CO2 26 07/31/2021 0949   GLUCOSE 112 (H) 07/31/2021 0949   BUN 24 (H) 07/31/2021 0949    BUN 16 11/22/2019 0828   CREATININE 1.10 07/31/2021 0949   CALCIUM 9.8 07/31/2021 0949   PROT 5.9 (L) 11/22/2019 0828   ALBUMIN 4.0 11/22/2019 0828   AST 15 11/22/2019 0828   ALT 19 11/22/2019 0828   ALKPHOS 64 11/22/2019 0828   BILITOT 1.0 11/22/2019 0828   GFRNONAA >60 07/31/2021 0949   GFRAA 91 11/22/2019 0828   Lab Results  Component Value Date   CHOL 167 11/22/2019   HDL 36 (L) 11/22/2019   LDLCALC 91 11/22/2019   TRIG 234 (H) 11/22/2019   CHOLHDL 4.6 11/22/2019   Lab Results  Component Value Date   HGBA1C 6.4 (H) 07/31/2021   No results found for: "VITAMINB12" Lab Results  Component Value Date   TSH 4.710 (H) 10/06/2016      ASSESSMENT AND PLAN 83 y.o. year old male  has a past medical history of Arthritis, Barrett's esophagus with dysplasia, Chronic lower back pain, Closed patellar sleeve fracture of left knee, Constipation, COPD (chronic obstructive pulmonary disease) (HCC), Crush injury knee, Dysphagia, Dysrhythmia, Enlarged prostate, Exertional dyspnea (03/21/2019), Hard of hearing, Hyperlipidemia, Insomnia, Joint pain, Joint swelling, Left tibial fracture, Multiple duodenal ulcers, Nocturia, OSA on CPAP, Pure hypercholesterolemia (05/22/2019), Type II diabetes mellitus (HCC), Unsteady gait, Urinary frequency, and Wears glasses. here with:  OSA on CPAP  CPAP compliance excellent Residual AHI is good Encouraged patient to continue using CPAP nightly and > 4 hours each night F/U in 1 year or sooner if needed    Butch Penny, MSN, NP-C 04/19/2023, 1:43 PM [facility] [address] [place], [place] [address] [phone]

## 2023-05-02 ENCOUNTER — Other Ambulatory Visit: Payer: Self-pay | Admitting: Internal Medicine

## 2023-05-05 ENCOUNTER — Encounter (HOSPITAL_BASED_OUTPATIENT_CLINIC_OR_DEPARTMENT_OTHER): Payer: Self-pay | Admitting: Cardiovascular Disease

## 2023-05-05 ENCOUNTER — Ambulatory Visit (HOSPITAL_BASED_OUTPATIENT_CLINIC_OR_DEPARTMENT_OTHER): Admitting: Cardiovascular Disease

## 2023-05-05 VITALS — BP 126/70 | HR 70 | Ht 66.0 in | Wt 200.4 lb

## 2023-05-05 DIAGNOSIS — I480 Paroxysmal atrial fibrillation: Secondary | ICD-10-CM | POA: Diagnosis not present

## 2023-05-05 DIAGNOSIS — E119 Type 2 diabetes mellitus without complications: Secondary | ICD-10-CM

## 2023-05-05 DIAGNOSIS — E781 Pure hyperglyceridemia: Secondary | ICD-10-CM | POA: Diagnosis not present

## 2023-05-05 DIAGNOSIS — G4733 Obstructive sleep apnea (adult) (pediatric): Secondary | ICD-10-CM | POA: Diagnosis not present

## 2023-05-05 NOTE — Progress Notes (Signed)
 Cardiology Office Note:  .   Date:  05/05/2023  ID:  [name], DOB 04/25/1940, MRN [id number] PCP: [name] Reichmann, DO  [name] Providers Cardiologist:  [name] Si, MD    History of Present Illness: [name] Kitchen    [name] is a 83 y.o. male with paroxysmal atrial fibrillation, aortic atherosclerosis, diabetes, hyperlipidemia, and OSA on CPAP, who presents for follow-up. [name] was initially seen 06/21/16 when he developed postoperative atrial fibrillation. He underwent closed manipulation of the left knee and was noted to be in atrial fibrillation in the PACU.  This was his first known episode of atrial fibrillation.  Prior to the episode he noted 2 weeks of exertional dyspnea. He was relatively asymptomatic and was started on metoprolol and Eliquis.  He followed up in atrial fibrillation clinic and was feeling well. TSH was mildly elevated but T4 was within the normal range.  He was subsequetly started on low dose levothyroxine.  He had an echo 07/02/16 that revealed LVEF 60-65% with grade 1 diastolic dysfunction.  He wore a 30-day event monitor 04/2017 that revealed no atrial fibrillation.  Given that his atrial fibrillation was perioperative, Eliquis was discontinued and he was started on aspirin.   [name] reported exertional dyspnea. He was referred for a Lexiscan Myoview 04/2019 that revealed LVEF 60% and no ischemia.  Amiodarone was discontinued. At the last visit, he was doing well but continued to complain of exertional dyspnea. He was referred to pulmonary and was started on Symbicort with improvement on his breathing. They found a pulmonary nodule and recommended surveillance.    At his visit 09/2021 he was working on exercise and had successfully lost 12 pounds.  Discussed the use of AI scribe software for clinical note transcription with the patient, who gave verbal consent to proceed.  History of Present Illness [name] is currently on atorvastatin for  cholesterol management and fenofibrate for triglycerides. He recently started on [name], which has helped lower his A1c to 6.0%. He stopped taking baby aspirin and isn't sure if he needs it. His LDL cholesterol was 84 in January, and he aims to lower it further.  He has started exercising, aiming to go to the gym three days a week for about 45 minutes to an hour. He is also trying to increase his water intake as part of his lifestyle modifications.  No heart racing, palpitations, or swelling in his legs or feet. He mentions a knee injury from an accident that prevents it from fully straightening.  He wants to lose at least 20 pounds and has been working on this for the past one to two weeks.  ROS:  As per HPI  Studies Reviewed: [name] Kitchen   EKG Interpretation Date/Time:  Thursday May 05 2023 13:50:13 EDT Ventricular Rate:  70 PR Interval:  190 QRS Duration:  74 QT Interval:  398 QTC Calculation: 429 R Axis:   4  Text Interpretation: Normal sinus rhythm Inferior infarct , age undetermined No significant change since last tracing Confirmed by [name] ([id number]) on 05/05/2023 2:03:35 PM     Risk Assessment/Calculations:    CHA2DS2-VASc Score = 5   This indicates a 7.2% annual risk of stroke. The patient's score is based upon: CHF History: 0 HTN History: 1 Diabetes History: 1 Stroke History: 0 Vascular Disease History: 1 Age Score: 2 Gender Score: 0            Physical Exam:   VS:  BP 126/70  Pulse 70   Ht 5\' 6"  (1.676 m)   Wt 200 lb 6.4 oz (90.9 kg)   SpO2 97%   BMI 32.35 kg/m  , BMI Body mass index is 32.35 kg/m. GENERAL:  Well appearing HEENT: Pupils equal round and reactive, fundi not visualized, oral mucosa unremarkable NECK:  No jugular venous distention, waveform within normal limits, carotid upstroke brisk and symmetric, no bruits, no thyromegaly LUNGS:  Clear to auscultation bilaterally HEART:  RRR.  PMI not displaced or sustained,S1 and S2 within normal  limits, no S3, no S4, no clicks, no rubs, no murmurs ABD:  Flat, positive bowel sounds normal in frequency in pitch, no bruits, no rebound, no guarding, no midline pulsatile mass, no hepatomegaly, no splenomegaly EXT:  2 plus pulses throughout, no edema, no cyanosis no clubbing SKIN:  No rashes no nodules NEURO:  Cranial nerves II through XII grossly intact, motor grossly intact throughout PSYCH:  Cognitively intact, oriented to person place and time   ASSESSMENT AND PLAN: .    Assessment & Plan # Aortic atherosclerosis: # Coronary calcification: Plaque in coronary arteries and aorta on CT. No symptoms of tachycardia or palpitations. Aspirin recommended for secondary prevention. - Restart low-dose aspirin for secondary prevention. -Continue statin.  # Paroxysmal atrial fibrillation Transient atrial fibrillation during operative period. No current symptoms.  He had no recurrent atrial relation on ambulatory monitor.  Anticoagulation not indicated. - Monitor for palpitations, chest pain, or dyspnea.  Okay to restart aspirin 81.  # OSA:  Continue CPAP.  # Hyperlipidemia LDL cholesterol 84 mg/dL, target <47 mg/dL. Current treatment with atorvastatin and fenofibrate. Emphasis on exercise and weight loss to achieve goals. - Continue atorvastatin and fenofibrate. - Reassess cholesterol levels this summer with his PCP.  LDL goal is less than 70.  If his LDL is not less than 70 then recommend that he increase atorvastatin to 80 mg daily.  May even need to add a PCSK9 inhibitor. - Encourage regular exercise and weight loss.  # Type 2 Diabetes Mellitus Hemoglobin A1c improved to 6.x% with Mounjaro. No medication issues. Lifestyle changes include increased exercise and water intake. - Continue Mounjaro as prescribed.   Dispo: Follow-up with cardiology as needed.  Recommend that he follow-up closely with his PCP.  Blood pressure goal is less than 130/80.  Goal is less than  70.  Signed, [name] Si, MD

## 2023-05-05 NOTE — Patient Instructions (Signed)
 Medication Instructions:  START ASPIRIN 81 MG DAILY    Labwork: FASTING LIPID PANEL WHEN YOU SEE YOUR PRIMARY CARE OVER THE SUMMER  LDL GOAL LESS THAN 70   Testing/Procedures: NONE  Follow-Up: AS NEEDED   Any Other Special Instructions Will Be Listed Below (If Applicable). INCREASE EXERCISE WITH GOAL OF 150 MINUTES EACH WEEK    If you need a refill on your cardiac medications before your next appointment, please call your pharmacy.

## 2023-05-16 ENCOUNTER — Other Ambulatory Visit: Payer: Self-pay | Admitting: Cardiovascular Disease

## 2023-09-08 ENCOUNTER — Encounter: Payer: Self-pay | Admitting: Adult Health

## 2023-09-08 ENCOUNTER — Ambulatory Visit: Admitting: Adult Health

## 2023-09-08 VITALS — BP 115/72 | HR 59 | Ht 67.0 in | Wt 170.0 lb

## 2023-09-08 DIAGNOSIS — G4733 Obstructive sleep apnea (adult) (pediatric): Secondary | ICD-10-CM

## 2023-09-08 NOTE — Progress Notes (Signed)
 PATIENT: [name] DOB: 06-Feb-1941  REASON FOR VISIT: follow up HISTORY FROM: patient PRIMARY NEUROLOGIST: Dr. Chalice   Chief Complaint  Patient presents with   Follow-up    Pt in 4 alone Pt here for cpap f/u Pt states no question or concerns for today's visit Pt states ED visit due to UTI and constipation     HISTORY OF PRESENT ILLNESS: Today 09/08/23:  [name] is a 83 y.o. male with a history of OSA on CPAP. Returns today for follow-up.  CPAP is working well.  Denies any new issues.  Download is below       REVIEW OF SYSTEMS: Out of a complete 14 system review of symptoms, the patient complains only of the following symptoms, and all other reviewed systems are negative.   ESS 7  ALLERGIES: No Known Allergies  HOME MEDICATIONS: Outpatient Medications Prior to Visit  Medication Sig Dispense Refill   albuterol  (VENTOLIN  HFA) 108 (90 Base) MCG/ACT inhaler Inhale 2 puffs into the lungs every 6 (six) hours as needed for wheezing or shortness of breath. 8 g 2   aspirin  EC 81 MG tablet Take 81 mg by mouth daily. Swallow whole.     atorvastatin  (LIPITOR ) 80 MG tablet Take 40-80 mg by mouth See admin instructions. Takes 80 mg by mouth daily on Monday, Wednesday, Fridays and take 40 mg by mouth on Tue, Thurs, Sat, and Sun     fenofibrate  (TRICOR ) 145 MG tablet TAKE 1 TABLET BY MOUTH ONCE DAILY . APPOINTMENT REQUIRED FOR FUTURE REFILLS 90 tablet 3   levothyroxine  (SYNTHROID ) 75 MCG tablet Take 75-150 mcg by mouth See admin instructions. Take 150mcg by mouth daily on Monday, Wednesday and Friday and take 75 mcg by mouth daily on Tue, Thurs, Sat, and Sun     MOUNJARO 7.5 MG/0.5ML Pen Inject 7.5 mg into the skin once a week.     Multiple Vitamins-Minerals (MENS MULTIVITAMIN) TABS Take 1 tablet by mouth daily.     omeprazole  (PRILOSEC) 20 MG capsule Take 20 mg by mouth daily.     Saw Palmetto 450 MG CAPS Take 450 mg by mouth daily.     docusate sodium  (COLACE) 100 MG  capsule Take 200 mg by mouth at bedtime.     Facility-Administered Medications Prior to Visit  Medication Dose Route Frequency Provider Last Rate Last Admin   incobotulinumtoxinA  (XEOMIN ) 100 units injection 400 Units  400 Units Intramuscular Q90 days Onita Duos, MD   400 Units at 12/15/17 1446    PAST MEDICAL HISTORY: Past Medical History:  Diagnosis Date   Arthritis    hands; probably in my back too (08/28/2015); osteo related   Barrett's esophagus with dysplasia    Chronic lower back pain    DDD   Closed patellar sleeve fracture of left knee    Constipation    COPD (chronic obstructive pulmonary disease) (HCC)    Crush injury knee    bilateral   Dysphagia    EGD with dilitation   Dysrhythmia    thought pt. had a fib after surgery saw cardiology later and ok meds preventive per pt.   Enlarged prostate    sees a urologist   Exertional dyspnea 03/21/2019   Hard of hearing    Hyperlipidemia    takes Lipitor  and Fish Oil  daily   Insomnia    d/t pain   Joint pain    Joint swelling    Left tibial fracture    Multiple  duodenal ulcers    Nocturia    OSA on CPAP    cpap   Pure hypercholesterolemia 05/22/2019   Type II diabetes mellitus (HCC)    type 2   Unsteady gait    Urinary frequency    Wears glasses     PAST SURGICAL HISTORY: Past Surgical History:  Procedure Laterality Date   ANTERIOR CERVICAL DECOMP/DISCECTOMY FUSION  10/2010   ANTERIOR LAT LUMBAR FUSION  12/02/2011   Procedure: ANTERIOR LATERAL LUMBAR FUSION 1 LEVEL;  Surgeon: Alm GORMAN Molt, MD;  Location: [facility];  Service: Neurosurgery;  Laterality: Left;  left lumbar three-four   BACK SURGERY     CATARACT EXTRACTION, BILATERAL Bilateral    about a month apart.    COLONOSCOPY  04/2007   ESOPHAGOGASTRODUODENOSCOPY     ESOPHAGOGASTRODUODENOSCOPY  04/2007; 07/2008   ESOPHAGOGASTRODUODENOSCOPY (EGD) WITH ESOPHAGEAL DILATION  2-3 times   EXCISIONAL HEMORRHOIDECTOMY  ~ 1966   FEMORAL REVISION Left  12/08/2016   Procedure: LEFT KNEE FEMORAL REVISION;  Surgeon: Melodi Lerner, MD;  Location: [facility];  Service: Orthopedics;  Laterality: Left;  Adductor Block   FINGER SURGERY Right    pinky finger   JOINT REPLACEMENT     KNEE ARTHROTOMY Left 07/24/2018   Procedure: Left knee arthrotomy with scar excision;  Surgeon: Melodi Lerner, MD;  Location: [facility];  Service: Orthopedics;  Laterality: Left;   KNEE ARTHROTOMY Left 05/02/2019   Procedure: Left knee arthrotomy; scar excision;  Surgeon: Melodi Lerner, MD;  Location: [facility];  Service: Orthopedics;  Laterality: Left;    KNEE ARTHROTOMY Left 08/12/2021   Procedure: Left knee arthrotomy; scar excision;  Surgeon: Melodi Lerner, MD;  Location: [facility];  Service: Orthopedics;  Laterality: Left;   KNEE CLOSED REDUCTION Left 06/21/2016   Procedure: CLOSED MANIPULATION LEFT KNEE;  Surgeon: Melodi Lerner, MD;  Location: [facility];  Service: Orthopedics;  Laterality: Left;   KNEE CLOSED REDUCTION Left 02/28/2017   Procedure: CLOSED MANIPULATION KNEE;  Surgeon: Melodi Lerner, MD;  Location: [facility];  Service: Orthopedics;  Laterality: Left;   KNEE JOINT MANIPULATION  06/2009   LAPAROSCOPIC CHOLECYSTECTOMY  02/2000   LUMBAR FUSION  03/16/2012   Dr Molt   ORIF TIBIA PLATEAU Left 09/24/2015   Procedure: OPEN REDUCTION INTERNAL FIXATION (ORIF) LEFT TIBIAL PLATEAU;  Surgeon: Lerner Melodi, MD;  Location: [facility];  Service: Orthopedics;  Laterality: Left;   PARTIAL KNEE ARTHROPLASTY Left 07/30/2015   Procedure: UNICOMPARTMENTAL LEFT KNEE MEDIAL;  Surgeon: Lerner Melodi, MD;  Location: [facility];  Service: Orthopedics;  Laterality: Left;   POSTERIOR LUMBAR FUSION  04/2015   rods inserted L2   REFRACTIVE SURGERY Right 1980s   SHOULDER OPEN ROTATOR CUFF REPAIR Bilateral 1990s   TENNIS ELBOW RELEASE/NIRSCHEL PROCEDURE Right 1980s   TOTAL KNEE ARTHROPLASTY Right 03/2009   TOTAL KNEE ARTHROPLASTY WITH REVISION COMPONENTS Left 03/03/2016   Procedure: REVISION LEFT KNEE  UNICOMPARTMENTAL ARTHROPLASTY TO TOTAL KNEE ARTHROPLASTY;  Surgeon: Lerner Melodi, MD;  Location: [facility];  Service: Orthopedics;  Laterality: Left;  requests 2hrs; Adductor Block    FAMILY HISTORY: Family History  Problem Relation Age of Onset   Diabetes Brother    Lung cancer Brother    Stomach cancer Mother    Lung cancer Father    Lung cancer Brother    Hodgkin's lymphoma Brother    Lung cancer Brother     SOCIAL HISTORY: Social History   Socioeconomic History   Marital status: Married    Spouse name:  Not on file   Number of children: 6   Years of education: 16   Highest education level: Bachelor's degree (e.g., BA, AB, BS)  Occupational History   Occupation: Part-time office work  Tobacco Use   Smoking status: Former    Current packs/day: 0.00    Average packs/day: 1.5 packs/day for 15.0 years (22.5 ttl pk-yrs)    Types: Cigarettes    Start date: 28    Quit date: 1989    Years since quitting: 36.6   Smokeless tobacco: Never  Vaping Use   Vaping status: Never Used  Substance and Sexual Activity   Alcohol use: Yes    Comment: beer occasionally   Drug use: No   Sexual activity: Not Currently  Other Topics Concern   Not on file  Social History Narrative   Lives at home with his wife.   Right-handed.   3 cups caffeine per day.   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  Generalized: Well developed, in no acute distress  Chest: Lungs clear to auscultation bilaterally  Neurological examination  Mentation: Alert oriented to time, place, history taking. Follows all  commands speech and language fluent Cranial nerve II-XII: Extraocular movements were full, visual field were full on confrontational test Head turning and shoulder shrug  were normal and symmetric. Motor: The motor testing reveals 5 over 5 strength of all 4 extremities. Good symmetric motor tone is noted throughout.  Sensory: Sensory testing is intact to soft touch on all 4 extremities. No evidence of extinction is noted.  Gait and station: Gait is normal.    DIAGNOSTIC DATA (LABS, IMAGING, TESTING) - I reviewed patient records, labs, notes, testing and imaging myself where available.  Lab Results  Component Value Date   WBC 4.8 07/31/2021   HGB 13.2 07/31/2021   HCT 36.0 (L) 07/31/2021   MCV 103.2 (H) 07/31/2021   PLT 200 07/31/2021      Component Value Date/Time   NA 144 07/31/2021 0949   NA 137 11/22/2019 0828   K 4.3 07/31/2021 0949   CL 114 (H) 07/31/2021 0949   CO2 26 07/31/2021 0949   GLUCOSE 112 (H) 07/31/2021 0949   BUN 24 (H) 07/31/2021 0949   BUN 16 11/22/2019 0828   CREATININE 1.10 07/31/2021 0949   CALCIUM  9.8 07/31/2021 0949   PROT 5.9 (L) 11/22/2019 0828   ALBUMIN  4.0 11/22/2019 0828   AST 15 11/22/2019 0828   ALT 19 11/22/2019 0828   ALKPHOS 64 11/22/2019 0828   BILITOT 1.0 11/22/2019 0828   GFRNONAA >60 07/31/2021 0949   GFRAA 91 11/22/2019 0828   Lab Results  Component Value Date   CHOL 167 11/22/2019   HDL 36 (L) 11/22/2019   LDLCALC 91 11/22/2019   TRIG 234 (H) 11/22/2019   CHOLHDL 4.6 11/22/2019   Lab Results  Component Value Date   HGBA1C 6.4 (H) 07/31/2021   No results found for: VITAMINB12 Lab Results  Component Value Date   TSH 4.710 (H) 10/06/2016  ASSESSMENT AND PLAN 83 y.o. year old male  has a past medical history of Arthritis, Barrett's esophagus with dysplasia, Chronic lower back pain, Closed patellar sleeve fracture of left knee, Constipation, COPD (chronic obstructive pulmonary disease) (HCC), Crush injury knee,  Dysphagia, Dysrhythmia, Enlarged prostate, Exertional dyspnea (03/21/2019), Hard of hearing, Hyperlipidemia, Insomnia, Joint pain, Joint swelling, Left tibial fracture, Multiple duodenal ulcers, Nocturia, OSA on CPAP, Pure hypercholesterolemia (05/22/2019), Type II diabetes mellitus (HCC), Unsteady gait, Urinary frequency, and Wears glasses. here with:  OSA on CPAP  - CPAP compliance excellent - Good treatment of AHI  - Encourage patient to use CPAP nightly and > 4 hours each night -The patient does qualify for new machine.  However he does not wish to proceed with this at this time.  Advised that he could contact her office and we would repeat a home sleep test first. - F/U in 1 year or sooner if needed     Duwaine Russell, MSN, NP-C 09/08/2023, 10:39 AM [facility] [address], [address] [place], [place] [address] [phone]  The patient's condition requires frequent monitoring and adjustments in the treatment plan, reflecting the ongoing complexity of care.  This provider is the continuing focal point for all needed services for this condition.

## 2023-09-08 NOTE — Patient Instructions (Signed)
 Continue using CPAP nightly and greater than 4 hours each night Please call office if you would like a new machine If your symptoms worsen or you develop new symptoms please let us  know.

## 2023-09-27 ENCOUNTER — Other Ambulatory Visit: Payer: Self-pay

## 2023-09-27 ENCOUNTER — Emergency Department (HOSPITAL_BASED_OUTPATIENT_CLINIC_OR_DEPARTMENT_OTHER)

## 2023-09-27 ENCOUNTER — Encounter (HOSPITAL_BASED_OUTPATIENT_CLINIC_OR_DEPARTMENT_OTHER): Payer: Self-pay

## 2023-09-27 ENCOUNTER — Emergency Department (HOSPITAL_BASED_OUTPATIENT_CLINIC_OR_DEPARTMENT_OTHER): Admission: EM | Admit: 2023-09-27 | Discharge: 2023-09-27 | Disposition: A | Discharge status: Home / Self Care

## 2023-09-27 DIAGNOSIS — E119 Type 2 diabetes mellitus without complications: Secondary | ICD-10-CM | POA: Diagnosis not present

## 2023-09-27 DIAGNOSIS — Z7982 Long term (current) use of aspirin: Secondary | ICD-10-CM | POA: Insufficient documentation

## 2023-09-27 DIAGNOSIS — K4090 Unilateral inguinal hernia, without obstruction or gangrene, not specified as recurrent: Secondary | ICD-10-CM | POA: Insufficient documentation

## 2023-09-27 DIAGNOSIS — Z79899 Other long term (current) drug therapy: Secondary | ICD-10-CM | POA: Insufficient documentation

## 2023-09-27 DIAGNOSIS — Z7984 Long term (current) use of oral hypoglycemic drugs: Secondary | ICD-10-CM | POA: Diagnosis not present

## 2023-09-27 DIAGNOSIS — R102 Pelvic and perineal pain: Secondary | ICD-10-CM | POA: Diagnosis present

## 2023-09-27 DIAGNOSIS — I10 Essential (primary) hypertension: Secondary | ICD-10-CM | POA: Diagnosis not present

## 2023-09-27 LAB — CBC
HCT: 37 % — ABNORMAL LOW (ref 39.0–52.0)
Hemoglobin: 12.9 g/dL — ABNORMAL LOW (ref 13.0–17.0)
MCH: 35 pg — ABNORMAL HIGH (ref 26.0–34.0)
MCHC: 34.9 g/dL (ref 30.0–36.0)
MCV: 100.3 fL — ABNORMAL HIGH (ref 80.0–100.0)
Platelets: 229 K/uL (ref 150–400)
RBC: 3.69 MIL/uL — ABNORMAL LOW (ref 4.22–5.81)
RDW: 13.2 % (ref 11.5–15.5)
WBC: 5.4 K/uL (ref 4.0–10.5)
nRBC: 0 % (ref 0.0–0.2)

## 2023-09-27 LAB — URINALYSIS, ROUTINE W REFLEX MICROSCOPIC
Bilirubin Urine: NEGATIVE
Glucose, UA: NEGATIVE mg/dL
Hgb urine dipstick: NEGATIVE
Ketones, ur: NEGATIVE mg/dL
Leukocytes,Ua: NEGATIVE
Nitrite: NEGATIVE
Protein, ur: NEGATIVE mg/dL
Specific Gravity, Urine: 1.02 (ref 1.005–1.030)
pH: 6 (ref 5.0–8.0)

## 2023-09-27 LAB — COMPREHENSIVE METABOLIC PANEL WITH GFR
ALT: 19 U/L (ref 0–44)
AST: 23 U/L (ref 15–41)
Albumin: 4.5 g/dL (ref 3.5–5.0)
Alkaline Phosphatase: 45 U/L (ref 38–126)
Anion gap: 13 (ref 5–15)
BUN: 19 mg/dL (ref 8–23)
CO2: 22 mmol/L (ref 22–32)
Calcium: 9.7 mg/dL (ref 8.9–10.3)
Chloride: 105 mmol/L (ref 98–111)
Creatinine, Ser: 0.98 mg/dL (ref 0.61–1.24)
GFR, Estimated: >60 mL/min (ref >60)
Glucose, Bld: 172 mg/dL — ABNORMAL HIGH (ref 70–99)
Potassium: 3.9 mmol/L (ref 3.5–5.1)
Sodium: 140 mmol/L (ref 135–145)
Total Bilirubin: 1.1 mg/dL (ref 0.0–1.2)
Total Protein: 6.8 g/dL (ref 6.5–8.1)

## 2023-09-27 LAB — LIPASE, BLOOD: Lipase: 29 U/L (ref 11–51)

## 2023-09-27 LAB — LACTIC ACID, PLASMA: Lactic Acid, Venous: 2.4 mmol/L (ref 0.5–1.9)

## 2023-09-27 MED ORDER — ONDANSETRON 4 MG PO TBDP
4.0000 mg | ORAL_TABLET | Freq: Once | ORAL | Status: AC | PRN
  Administered 2023-09-27: 4 mg via ORAL
  Filled 2023-09-27: qty 1

## 2023-09-27 MED ORDER — ONDANSETRON 4 MG PO TBDP: 4.0000 mg | ORAL_TABLET | Freq: Three times a day (TID) | ORAL | 0 refills | Status: AC | PRN

## 2023-09-27 MED ORDER — MORPHINE SULFATE (PF) 2 MG/ML IV SOLN
2.0000 mg | Freq: Once | INTRAVENOUS | Status: AC
  Administered 2023-09-27: 2 mg via INTRAVENOUS
  Filled 2023-09-27: qty 1

## 2023-09-27 NOTE — ED Provider Notes (Signed)
 [place] [facility] Provider Note   CSN: [id number] Arrival date & time: 09/27/23  1136     Patient presents with: Pelvic Pain   [name] is a 83 y.o. male.   83 year old male with past medical history of hypertension hyperlipidemia as well as diabetes presenting to the emergency department today with flank pain and inability to urinate.  The patient states that this started earlier this morning.  The patient states that he has had some vomiting with this.  States that he has similar episode about a month ago and was treated at [facility] for a UTI.  The patient was able to urinate on his own eventually that point.  He denies any blood in his urine.  He did not have any imaging at that time.  The patient denies any known history of kidney stones.  He came to the emergency department today for further evaluation due to this.  He states he is having some right sided flank pain with this.  The patient does report that he was having some chills this morning but denies any fevers.   Pelvic Pain       Prior to Admission medications   Medication Sig Start Date End Date Taking? Authorizing Provider  ondansetron  (ZOFRAN -ODT) 4 MG disintegrating tablet Take 1 tablet (4 mg total) by mouth every 8 (eight) hours as needed for nausea or vomiting. 09/27/23  Yes Ula Prentice SAUNDERS, MD  albuterol  (VENTOLIN  HFA) 108 (90 Base) MCG/ACT inhaler Inhale 2 puffs into the lungs every 6 (six) hours as needed for wheezing or shortness of breath. 02/06/21   Cobb, Comer GAILS, NP  aspirin  EC 81 MG tablet Take 81 mg by mouth daily. Swallow whole.    [provider]  atorvastatin  (LIPITOR ) 80 MG tablet Take 40-80 mg by mouth See admin instructions. Takes 80 mg by mouth daily on Monday, Wednesday, Fridays and take 40 mg by mouth on Tue, Thurs, Sat, and Sun    [provider]  docusate sodium  (COLACE) 100 MG capsule Take 200 mg by mouth at  bedtime.    [provider]  fenofibrate  (TRICOR ) 145 MG tablet TAKE 1 TABLET BY MOUTH ONCE DAILY . APPOINTMENT REQUIRED FOR FUTURE REFILLS 05/16/23   Raford Riggs, MD  levothyroxine  (SYNTHROID ) 75 MCG tablet Take 75-150 mcg by mouth See admin instructions. Take 150mcg by mouth daily on Monday, Wednesday and Friday and take 75 mcg by mouth daily on Tue, Thurs, Sat, and Sun    [provider]  MOUNJARO 7.5 MG/0.5ML Pen Inject 7.5 mg into the skin once a week. 05/04/23   [provider]  Multiple Vitamins-Minerals (MENS MULTIVITAMIN) TABS Take 1 tablet by mouth daily.    [provider]  omeprazole  (PRILOSEC) 20 MG capsule Take 20 mg by mouth daily.    [provider]  Saw Palmetto 450 MG CAPS Take 450 mg by mouth daily.    [provider]    Allergies: Patient has no known allergies.    Review of Systems  Genitourinary:  Positive for flank pain and pelvic pain.  All other systems reviewed and are negative.   Updated Vital Signs BP (!) 189/87   Pulse 74   Temp 98.1 F (36.7 C)   Resp 18   Ht 5' 7 (1.702 m)   Wt 77.1 kg   SpO2 100%   BMI 26.63 kg/m   Physical Exam Vitals and nursing note reviewed.  Gen: Appears uncomfortable Eyes: PERRL, EOMI HEENT: no oropharyngeal swelling Neck: trachea midline Resp: clear to auscultation bilaterally Card: RRR, no murmurs, rubs, or gallops Abd: Suprapubic tenderness and right CVA tenderness noted without guarding or rebound Extremities: no calf tenderness, no edema Vascular: 2+ radial pulses bilaterally, 2+ DP pulses bilaterally Skin: no rashes Psyc: acting appropriately   (all labs ordered are listed, but only abnormal results are displayed) Labs Reviewed  COMPREHENSIVE METABOLIC PANEL WITH GFR - Abnormal; Notable for the following components:      Result Value   Glucose, Bld 172 (*)    All other components within normal limits  CBC - Abnormal; Notable for the following  components:   RBC 3.69 (*)    Hemoglobin 12.9 (*)    HCT 37.0 (*)    MCV 100.3 (*)    MCH 35.0 (*)    All other components within normal limits  LACTIC ACID, PLASMA - Abnormal; Notable for the following components:   Lactic Acid, Venous 2.4 (*)    All other components within normal limits  LIPASE, BLOOD  URINALYSIS, ROUTINE W REFLEX MICROSCOPIC  LACTIC ACID, PLASMA    EKG: None  Radiology: CT ABDOMEN PELVIS WO CONTRAST Result Date: 09/27/2023 CLINICAL DATA:  Abdominal/flank pain, stone suspected EXAM: CT ABDOMEN AND PELVIS WITHOUT CONTRAST TECHNIQUE: Multidetector CT imaging of the abdomen and pelvis was performed following the standard protocol without IV contrast. RADIATION DOSE REDUCTION: This exam was performed according to the departmental dose-optimization program which includes automated exposure control, adjustment of the mA and/or kV according to patient size and/or use of iterative reconstruction technique. COMPARISON:  August 16, 2023 FINDINGS: Of note, the lack of intravenous contrast limits evaluation of the solid organ parenchyma and vascularity. Lower chest: No focal airspace consolidation or pleural effusion. Right coronary artery atherosclerosis. Hepatobiliary: No mass.Cholecystectomy. No intrahepatic or extrahepatic biliary ductal dilation. Pancreas: No mass or main ductal dilation. No peripancreatic inflammation or fluid collection. Spleen: Normal size. No mass. Adrenals/Urinary Tract: No adrenal masses. No renal mass. No hydronephrosis or nephrolithiasis. The urinary bladder is distended without focal abnormality. Stomach/Bowel: The stomach is decompressed without focal abnormality. No small bowel wall thickening or inflammation. No small bowel obstruction.Normal appendix. Descending and sigmoid colonic diverticulosis. No changes of acute diverticulitis. Vascular/Lymphatic: No aortic aneurysm. Diffuse aortoiliac atherosclerosis. No intraabdominal or pelvic lymphadenopathy.  Reproductive: No prostatomegaly. No free pelvic fluid. Other: No pneumoperitoneum, ascites, or mesenteric inflammation. Musculoskeletal: No acute fracture or destructive lesion. Pedicle screw and rod fixation with intervertebral disc spacers. Multilevel degenerative disc disease of the spine. Diffuse osteopenia. Small fat containing right inguinal hernia containing a small amount of layering fluid. IMPRESSION: 1. No acute intra-abdominal or pelvic abnormality. 2. Small fat containing right inguinal hernia containing a small amount of layering fluid, which may reflect changes of early vascular compromise. Correlation with physical exam findings recommended. 3. Total colonic diverticulosis. No changes of acute diverticulitis. Aortic Atherosclerosis (ICD10-I70.0). Electronically Signed   By: Rogelia Myers M.D.   On: 09/27/2023 13:00     Procedures   Medications Ordered in the ED  ondansetron  (ZOFRAN -ODT) disintegrating tablet 4 mg (4 mg Oral Given 09/27/23 1149)  morphine  (PF) 2 MG/ML injection 2 mg (2 mg Intravenous Given 09/27/23 1203)                                    Medical Decision Making 83 year old male with past medical history  of diabetes, hypertension, and hyperlipidemia presenting to the emergency department today with right flank pain and inability to urinate.  Will further evaluate the patient here with basic labs also urinalysis to evaluate for urinary tract infection.  I will obtain a CT scan of his abdomen to evaluate for ureterolithiasis or other cause for urinary obstruction.  Will obtain a bladder scan here and if the patient is retaining will place Foley catheter.  Will obtain a lactic acid given the patient's chills to screen for sepsis.  I will reevaluate for ultimate disposition.  The patient's labs are reassuring.  Urinalysis does not show any findings consistent with infection.  The patient's bladder scan had 150 mL and the patient was able to urinate and does not have any  issues currently.  His CT scan does show a right inguinal hernia that is containing fat.  On exam patient does have a palpable hernia but this does seem to be reducible.  He is well-appearing and denies any pain on reassessment.  I did discuss this with the patient.  I think that he is stable for discharge.  Will give him general surgery follow-up.  He is encouraged to return to the emergency department with worsening symptoms.  Amount and/or Complexity of Data Reviewed Labs: ordered. Radiology: ordered.  Risk Prescription drug management.        Final diagnoses:  Right inguinal hernia    ED Discharge Orders          Ordered    ondansetron  (ZOFRAN -ODT) 4 MG disintegrating tablet  Every 8 hours PRN        09/27/23 1358               Ula Prentice SAUNDERS, MD 09/27/23 1400

## 2023-09-27 NOTE — ED Notes (Signed)

## 2023-09-27 NOTE — ED Triage Notes (Signed)
 Pt states that he was in [facility] with a UTI and he feels like it is back. States that the pain is back and that he thinks that the infection may be back.  States that he is unable to void. He only trickles urine at this point. Pt noted to be vomiting in triage and shaking due to pain.

## 2023-09-27 NOTE — ED Notes (Signed)
 Patient transported to CT

## 2023-09-27 NOTE — ED Notes (Signed)
 Pt has returned to their room from CT

## 2023-09-27 NOTE — Discharge Instructions (Signed)
 Your workup today overall was reassuring.  There was a hernia noted on your CT scan.  This does not appear to be causing any issues at this time.  Please follow-up with the surgeon at the number provided.  Take the Zofran  as needed for nausea.  Continue your chronic pain medication at home for pain.  Please return to the emergency department for worsening symptoms.

## 2023-10-11 ENCOUNTER — Ambulatory Visit: Payer: Self-pay | Admitting: Surgery

## 2024-09-06 ENCOUNTER — Ambulatory Visit: Admitting: Adult Health
